# Patient Record
Sex: Male | Born: 1960
Health system: Southern US, Community
[De-identification: ages and names within clinical notes are randomized; demographics above are authoritative.]

## PROBLEM LIST (undated history)

## (undated) DIAGNOSIS — R1310 Dysphagia, unspecified: Secondary | ICD-10-CM

## (undated) DIAGNOSIS — M1990 Unspecified osteoarthritis, unspecified site: Secondary | ICD-10-CM

## (undated) DIAGNOSIS — M19019 Primary osteoarthritis, unspecified shoulder: Secondary | ICD-10-CM

## (undated) DIAGNOSIS — M2550 Pain in unspecified joint: Secondary | ICD-10-CM

## (undated) DIAGNOSIS — E119 Type 2 diabetes mellitus without complications: Secondary | ICD-10-CM

## (undated) DIAGNOSIS — I2510 Atherosclerotic heart disease of native coronary artery without angina pectoris: Secondary | ICD-10-CM

## (undated) DIAGNOSIS — R12 Heartburn: Secondary | ICD-10-CM

## (undated) DIAGNOSIS — R053 Chronic cough: Secondary | ICD-10-CM

## (undated) DIAGNOSIS — E538 Deficiency of other specified B group vitamins: Secondary | ICD-10-CM

## (undated) DIAGNOSIS — E11319 Type 2 diabetes mellitus with unspecified diabetic retinopathy without macular edema: Secondary | ICD-10-CM

## (undated) DIAGNOSIS — M7989 Other specified soft tissue disorders: Secondary | ICD-10-CM

## (undated) DIAGNOSIS — N289 Disorder of kidney and ureter, unspecified: Secondary | ICD-10-CM

## (undated) DIAGNOSIS — E669 Obesity, unspecified: Secondary | ICD-10-CM

## (undated) DIAGNOSIS — E7800 Pure hypercholesterolemia, unspecified: Secondary | ICD-10-CM

## (undated) DIAGNOSIS — F988 Other specified behavioral and emotional disorders with onset usually occurring in childhood and adolescence: Secondary | ICD-10-CM

## (undated) DIAGNOSIS — G629 Polyneuropathy, unspecified: Secondary | ICD-10-CM

## (undated) DIAGNOSIS — M549 Dorsalgia, unspecified: Secondary | ICD-10-CM

## (undated) DIAGNOSIS — R05 Cough: Secondary | ICD-10-CM

## (undated) DIAGNOSIS — E785 Hyperlipidemia, unspecified: Secondary | ICD-10-CM

## (undated) DIAGNOSIS — M25569 Pain in unspecified knee: Secondary | ICD-10-CM

## (undated) DIAGNOSIS — G4730 Sleep apnea, unspecified: Secondary | ICD-10-CM

## (undated) DIAGNOSIS — I214 Non-ST elevation (NSTEMI) myocardial infarction: Secondary | ICD-10-CM

## (undated) DIAGNOSIS — K219 Gastro-esophageal reflux disease without esophagitis: Secondary | ICD-10-CM

## (undated) DIAGNOSIS — I10 Essential (primary) hypertension: Secondary | ICD-10-CM

## (undated) HISTORY — DX: Other specified behavioral and emotional disorders with onset usually occurring in childhood and adolescence: F98.8

## (undated) HISTORY — DX: Type 2 diabetes mellitus with unspecified diabetic retinopathy without macular edema: E11.319

## (undated) HISTORY — DX: Hyperlipidemia, unspecified: E78.5

## (undated) HISTORY — DX: Other specified soft tissue disorders: M79.89

## (undated) HISTORY — DX: Pain in unspecified knee: M25.569

## (undated) HISTORY — PX: ACHILLES TENDON REPAIR: SUR1153

## (undated) HISTORY — DX: Deficiency of other specified B group vitamins: E53.8

## (undated) HISTORY — DX: Chronic cough: R05.3

## (undated) HISTORY — DX: Obesity, unspecified: E66.9

## (undated) HISTORY — PX: UVULOPALATOPHARYNGOPLASTY: SHX827

## (undated) HISTORY — DX: Pure hypercholesterolemia, unspecified: E78.00

## (undated) HISTORY — DX: Essential (primary) hypertension: I10

## (undated) HISTORY — DX: Unspecified osteoarthritis, unspecified site: M19.90

## (undated) HISTORY — DX: Dysphagia, unspecified: R13.10

## (undated) HISTORY — DX: Type 2 diabetes mellitus without complications: E11.9

## (undated) HISTORY — DX: Dorsalgia, unspecified: M54.9

## (undated) HISTORY — DX: Heartburn: R12

## (undated) HISTORY — DX: Primary osteoarthritis, unspecified shoulder: M19.019

## (undated) HISTORY — DX: Non-ST elevation (NSTEMI) myocardial infarction: I21.4

## (undated) HISTORY — DX: Pain in unspecified joint: M25.50

## (undated) HISTORY — DX: Polyneuropathy, unspecified: G62.9

## (undated) HISTORY — PX: SHOULDER ARTHROSCOPY: SHX128

## (undated) HISTORY — DX: Disorder of kidney and ureter, unspecified: N28.9

---
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
## 1997-06-16 ENCOUNTER — Other Ambulatory Visit: Admission: RE | Admit: 1997-06-16 | Discharge: 1997-06-16 | Payer: Self-pay | Admitting: *Deleted

## 1997-10-03 ENCOUNTER — Emergency Department (HOSPITAL_COMMUNITY): Admission: EM | Admit: 1997-10-03 | Discharge: 1997-10-03 | Payer: Self-pay | Admitting: Emergency Medicine

## 1998-04-20 ENCOUNTER — Encounter: Payer: Self-pay | Admitting: Orthopaedic Surgery

## 1998-04-20 ENCOUNTER — Ambulatory Visit (HOSPITAL_COMMUNITY): Admission: RE | Admit: 1998-04-20 | Discharge: 1998-04-20 | Payer: Self-pay | Admitting: Orthopaedic Surgery

## 1998-06-24 ENCOUNTER — Ambulatory Visit (HOSPITAL_COMMUNITY): Admission: RE | Admit: 1998-06-24 | Discharge: 1998-06-25 | Payer: Self-pay | Admitting: *Deleted

## 2003-05-28 ENCOUNTER — Encounter: Admission: RE | Admit: 2003-05-28 | Discharge: 2003-05-28 | Payer: Self-pay | Admitting: Internal Medicine

## 2004-09-20 ENCOUNTER — Encounter: Admission: RE | Admit: 2004-09-20 | Discharge: 2004-09-20 | Payer: Self-pay | Admitting: Internal Medicine

## 2004-09-29 ENCOUNTER — Encounter: Admission: RE | Admit: 2004-09-29 | Discharge: 2004-09-29 | Payer: Self-pay | Admitting: Internal Medicine

## 2007-05-15 ENCOUNTER — Ambulatory Visit: Payer: Self-pay | Admitting: Emergency Medicine

## 2007-05-15 DIAGNOSIS — F988 Other specified behavioral and emotional disorders with onset usually occurring in childhood and adolescence: Secondary | ICD-10-CM | POA: Insufficient documentation

## 2007-05-15 DIAGNOSIS — E669 Obesity, unspecified: Secondary | ICD-10-CM | POA: Insufficient documentation

## 2007-05-15 DIAGNOSIS — G4730 Sleep apnea, unspecified: Secondary | ICD-10-CM

## 2007-05-15 DIAGNOSIS — G4733 Obstructive sleep apnea (adult) (pediatric): Secondary | ICD-10-CM | POA: Insufficient documentation

## 2007-05-15 DIAGNOSIS — R05 Cough: Secondary | ICD-10-CM

## 2007-05-15 DIAGNOSIS — I10 Essential (primary) hypertension: Secondary | ICD-10-CM

## 2007-05-15 DIAGNOSIS — J309 Allergic rhinitis, unspecified: Secondary | ICD-10-CM | POA: Insufficient documentation

## 2007-05-15 DIAGNOSIS — E119 Type 2 diabetes mellitus without complications: Secondary | ICD-10-CM | POA: Insufficient documentation

## 2007-05-15 DIAGNOSIS — R059 Cough, unspecified: Secondary | ICD-10-CM | POA: Insufficient documentation

## 2007-06-16 ENCOUNTER — Ambulatory Visit: Payer: Self-pay | Admitting: Emergency Medicine

## 2007-06-20 ENCOUNTER — Encounter: Payer: Self-pay | Admitting: Emergency Medicine

## 2007-07-30 ENCOUNTER — Ambulatory Visit: Payer: Self-pay | Admitting: Emergency Medicine

## 2010-02-12 DIAGNOSIS — I209 Angina pectoris, unspecified: Secondary | ICD-10-CM

## 2010-02-12 HISTORY — DX: Angina pectoris, unspecified: I20.9

## 2010-02-12 HISTORY — PX: OTHER SURGICAL HISTORY: SHX169

## 2010-03-05 ENCOUNTER — Encounter: Payer: Self-pay | Admitting: Internal Medicine

## 2010-03-16 NOTE — Assessment & Plan Note (Signed)
Summary: cough   Visit Type:  Follow-up Referred by:  Johnella Moloney PCP:  Kevan Ny  Chief Complaint:  cough and f/u PFTs.  History of Present Illness: 50 yo man with HTN, OSA on CPAP for last 5 -6 months. Seen last month in consultation for chronic cough and significant vasomotor nasal gtt. He had been treated for asthma empirically prior to our visit without significant improvement. We started a regimen for PND and also for GERD. He had PFT scheduled for today, but was unable to complete - had pre-syncope with spirometry and the testing was stopped.  Started omeprazole x 14 days, then stopped. Singulair daily, also started pseudophed PE.  Cough is definitely better, does still occur intermittantly. Non-productive. Does have some exertional SOB.      Current Allergies: No known allergies      Review of Systems      See HPI   Vital Signs:  Patient Profile:   50 Years Old Male Height:     71 inches Weight:      318 pounds O2 Sat:      96 % O2 treatment:    Room Air Temp:     98.1 degrees F oral Pulse rate:   82 / minute BP sitting:   118 / 80  (left arm) Cuff size:   large  Vitals Entered By: Marinus Maw (Jun 16, 2007 9:18 AM)             Comments Medications reviewed with patient Marinus Maw  Jun 16, 2007 9:25 AM      Physical Exam  General:      pleasant cooperative obese man, NAD on RA Head:     normocephalic and atraumatic Nose:     no deformity, discharge, inflammation, or lesions Mouth:     narrow posterior pharynx, mild erythema Neck:     no masses, thyromegaly, or abnormal cervical nodes Lungs:     clear bilaterally Heart:     regular rate and rhythm, S1, S2 without murmurs, rubs, gallops, or clicks Abdomen:     not examined  Msk:     no deformity or scoliosis noted with normal posture Extremities:     no clubbing, cyanosis, edema, or deformity noted Cervical Nodes:     no significant adenopathy Psych:     alert and  cooperative; normal mood and affect; normal attention span and concentration     Impression & Recommendations:  Problem # 1:  COUGH (ICD-786.2) There has been improvement, still some residual dry cough. Will add loratadine, change his decongestant to as needed. Continue Singulair and change schedule to pm. Will not add back omeprazole at this time, but consider if sx persist.  Only able to give a single spirometry  - curve looks normal, raw data with possible mixed mild restriction and obstruction. Will not pursue asthma at this time, but discussed repeating PFT's if he has suggestive sx. RTC in 4 -6 weeks to review progress. Orders: Est. Patient Level IV (16109)   Problem # 2:  SLEEP APNEA (ICD-780.57)  Problem # 3:  ALLERGIC RHINITIS (ICD-477.9)  His updated medication list for this problem includes:    Sudafed 30 Mg Tabs (Pseudoephedrine hcl) .Marland Kitchen... As needed   Problem # 4:  HYPERTENSION (ICD-401.9)  The following medications were removed from the medication list:    Cartia Xt 180 Mg Cp24 (Diltiazem hcl coated beads) .Marland Kitchen... Take 1 tablet by mouth once a day  His updated medication list for  this problem includes:    Exforge 5-320 Mg Tabs (Amlodipine besylate-valsartan) ..... One tablet every other day.    Exforge 10-320 Mg Tabs (Amlodipine besylate-valsartan) ..... One tablet every other day.   Medications Added to Medication List This Visit: 1)  Singulair 10 Mg Tabs (Montelukast sodium) .... One by mouth daily 2)  Sudafed 30 Mg Tabs (Pseudoephedrine hcl) .... As needed 3)  Exforge 5-320 Mg Tabs (Amlodipine besylate-valsartan) .... One tablet every other day. 4)  Exforge 10-320 Mg Tabs (Amlodipine besylate-valsartan) .... One tablet every other day.   Patient Instructions: 1)  Start loratadine 10mg  daily. 2)  Continue the Singulair 10mg  every evening. 3)  Change your pseudophed to as needed.  4)  Do not restart the omeprazole at this time. 5)  We may consider repeating your  PFT's at some point in the future if new symptoms evolve. 6)  Return to see Dr Delton Coombes in 4 - 6 weeks.     ]

## 2010-03-16 NOTE — Assessment & Plan Note (Signed)
Summary: cough, OSA   Visit Type:  Follow-up Referred by:  Johnella Moloney PCP:  Kevan Ny  Chief Complaint:  Cough and OSA.  History of Present Illness: 50 yo man with HTN, OSA on CPAP for last 5 -6 months. Seen last month in consultation for chronic cough and significant vasomotor nasal gtt. Started loratadine after last visit, has now stopped as cough resolved.  Still coughs occasionally, no longer paroxysmal, not having every day, non-productive. Overall much improved. Denies dyspnea or wheeze, spiro does not support AFL.     Current Allergies: No known allergies       Vital Signs:  Patient Profile:   50 Years Old Male Height:     71 inches O2 Sat:      93 % O2 treatment:    Room Air Temp:     99.2 degrees F oral Pulse rate:   100 / minute BP sitting:   134 / 84  (left arm)  Vitals Entered By: Cloyde Reams RN (July 30, 2007 1:34 PM)             Comments Pt is here today for a follow-up visit. Pt refused to weight today.  Pt states cough is much better.  Still has occ minor non-productive cough.  Pt is using CPAP for sleep apnea without any problems.Currently on doxycycline for a spider bite started last Thursday.   Medications reviewed Cloyde Reams RN  July 30, 2007 1:40 PM      Physical Exam  General:      pleasant cooperative obese man, NAD on RA Head:     normocephalic and atraumatic Nose:     no deformity, discharge, inflammation, or lesions Mouth:     narrow posterior pharynx, clear Neck:     no masses, thyromegaly, or abnormal cervical nodes Lungs:     clear bilaterally Heart:     regular rate and rhythm, S1, S2 without murmurs, rubs, gallops, or clicks Abdomen:     not examined  Msk:     no deformity or scoliosis noted with normal posture Extremities:     no clubbing, cyanosis, edema, or deformity noted Cervical Nodes:     no significant adenopathy Psych:     alert and cooperative; normal mood and affect; normal attention span and  concentration     Impression & Recommendations:  Problem # 1:  COUGH (ICD-786.2) Much improved. Advised loratadine during allergy season, continue singulair although may be able to wean to off in future. F/U as needed  Orders: Est. Patient Level III (04540)   Problem # 2:  SLEEP APNEA (ICD-780.57) CPAP Orders: Est. Patient Level III (98119)   Problem # 3:  ALLERGIC RHINITIS (ICD-477.9)   Patient Instructions: 1)  Continue Singulair once daily. 2)  Use loratadine 10mg  once daily during allergy seasons. 3)  Try chlorphenirimine or pseudophedrine for nasal congestion as needed. 4)  Continue your CPAP every night.  5)  Follow up with Dr Delton Coombes as needed.    ]

## 2010-03-16 NOTE — Miscellaneous (Signed)
Summary: PFT Results  Clinical Lists Changes  Observations: Added new observation of PFT COMMENTS: Only one trial performed. Raw data suggest possible mixed disease. The flow volume loop has a  normal shape, less suggestive of obstruction. RSB (06/20/2007 15:35) Added new observation of FEF % EXPEC: 60 % (06/20/2007 15:35) Added new observation of FEF25-75%PRE: 3.87 L/min (06/20/2007 15:35) Added new observation of FEF 25-75%: 2.32 L/min (06/20/2007 15:35) Added new observation of FEV1/FVC%EXP: - % (06/20/2007 15:35) Added new observation of FEV1/FVC PRE: 74 % (06/20/2007 15:35) Added new observation of FEV1/FVC: 75 % (06/20/2007 15:35) Added new observation of FEV1 % EXP: 76 % (06/20/2007 15:35) Added new observation of FEV1 PREDICT: 3.86 L (06/20/2007 15:35) Added new observation of FEV1: 2.92 L (06/20/2007 15:35) Added new observation of FVC % EXPECT: 75 % (06/20/2007 15:35) Added new observation of FVC PREDICT: 5.17 L (06/20/2007 15:35) Added new observation of FVC: 3.89 L (06/20/2007 15:35) Added new observation of PFT DATE: 06/16/2007  (06/20/2007 15:35)       Pulmonary Function Test Date: 06/16/2007 Height (in.): 71 Gender: Male  Pre-Spirometry FVC    Value: 3.89 L/min   Pred: 5.17 L/min     % Pred: 75 % FEV1    Value: 2.92 L     Pred: 3.86 L     % Pred: 76 % FEV1/FVC  Value: 75 %     Pred: 74 %     % Pred: - % FEF 25-75  Value: 2.32 L/min   Pred: 3.87 L/min     % Pred: 60 %  Comments: Only one trial performed. Raw data suggest possible mixed disease. The flow volume loop has a  normal shape, less suggestive of obstruction. RSB  Pt unable to do full PFT.  Only able to get simple spirometry x 1 trial. Cloyde Reams RN  Jun 20, 2007 3:39 PM

## 2010-03-16 NOTE — Assessment & Plan Note (Signed)
Summary: Cough, OSA   Visit Type:  Initial Consult Referred by:  Johnella Moloney PCP:  Kevan Ny  Chief Complaint:  cough.  History of Present Illness: 50 yo man with HTN, OSA on CPAP for last 5 -6 months. Also with obesity - wt has been steady over last 8 months, allergic rhinitis - used to be on immunotherapy as a youth.  Did not have cough until 10/08.  At that time began to have cough and increased allergic sx. Started CPAP around the same time.  Has fits of cough, has come and gone for days at a time, dry cough, no globus sensation. Clear nasal drainage, occas treats with Singulair - not scheduled. No CP. Tires a bit easier over several months time. Hears noise with breathing, others hear it too.  Has been tried on Symbicort and Xopenex empirically for ? asthma.  Has since stopped these meds because they didi not help the cough. Denies GERD sx. Has hx esophageal strictures s/p dilation. No longer having dysphagia or food getting stuck.     Current Allergies: No known allergies   Past Medical History:    Allergic Rhinitis dating back to childhood    ? Asthma (no reported PFT's)    ADHD    Hypertension    Sleep Apnea on CPAP     Gout    Hx of esophageal strictures, s/p dilation (last time 2-3 yrs ago)  Past Surgical History:    Ulnar nerve palsy + transposition    torn ankle tendons    torn achilles tendon   Family History:    father-heart disease    mother-breast CA    mat uncle-asthma  Social History:    Patient never smoked.     Pt is married with children.    Pt is employeed in sales.   Risk Factors:  Tobacco use:  never HIV high-risk behavior:  no Alcohol use:  yes    Type:  Beer    Comments:  Beer on the weekends   Review of Systems      See HPI   Vital Signs:  Patient Profile:   50 Years Old Male O2 Sat:      94 % O2 treatment:    Room Air Temp:     98.7 degrees F oral Pulse rate:   93 / minute BP sitting:   170 / 110  (right arm)  Vitals Entered  By: Cloyde Reams RN (May 15, 2007 3:50 PM)             Comments Pt is here today for a new patient concult for chronic cough. Pt declines weight.  States he does not want a written record of his weight.  C/O dry chronic cough since 10/08.  Medications reviewed Pt states he has not had any CXRs or PFTs done. ..................................................................Marland KitchenCloyde Reams RN  May 15, 2007 3:56 PM       Physical Exam  General:      pleasant cooperative obese man, NAD on RA Head:     normocephalic and atraumatic Nose:     no deformity, discharge, inflammation, or lesions Mouth:     narrow posterior pharynx, mild erythema Neck:     no masses, thyromegaly, or abnormal cervical nodes Lungs:     clear bilaterally Heart:     regular rate and rhythm, S1, S2 without murmurs, rubs, gallops, or clicks Abdomen:     not examined  Msk:     no deformity or  scoliosis noted with normal posture Extremities:     no clubbing, cyanosis, edema, or deformity noted Cervical Nodes:     no significant adenopathy Psych:     alert and cooperative; normal mood and affect; normal attention span and concentration     Impression & Recommendations:  Problem # 1:  COUGH (ICD-786.2) Most overwhelming contributor appears to be allergic +/- chronic vasomotor rhinitis.  Also consider influence of GERD given hx esophageal strictures and dilations. Finally consider asthma, although he hasn't significantly improved on empiric therapy.  Start OTC decongestant regimen (scheduled) - chlorphenirimine, pseudoephedrine Start nasonex daily Start empiric omeprazole. Will peel this off as sx improve to see if necessary to continue on scheduled basis. Full PFT's to look for AFL - if present then maintainance asthma regimen Est. Patient Level IV (04540)  Orders: Est. Patient Level IV (98119)   Problem # 2:  ALLERGIC RHINITIS (ICD-477.9) As above  Problem # 3:  SLEEP APNEA  (ICD-780.57) Continue current CPAP - interestingly his cough started around the tikme he started CPAP. ? positive pressure irritating posterior pharynx - usually CPAP helps chronic coughers by stopping snoring and upper airway irritation.  Will follow sx, if not improving then may try break from CPAP temporarily to allow UA sx to calm, then rechallenge.  Medications Added to Medication List This Visit: 1)  Topamax 50 Mg Tabs (Topiramate) .... Take 1 tablet by mouth once a day 2)  Lexapro 10 Mg Tabs (Escitalopram oxalate) .... Take 1 tablet by mouth once a day 3)  Cartia Xt 180 Mg Cp24 (Diltiazem hcl coated beads) .... Take 1 tablet by mouth once a day 4)  Methylphenidate Hcl 10 Mg Tbcr (Methylphenidate hcl) .... Take 1 tablet by mouth once a day   Patient Instructions: 1)  start Nasonex 2 sprays each nostril daily 2)  Start chlorphenerimine/pseudoephedrine over-the-counter, as directed by the label. take daily until return visit. 3)  Start omeprazole 20mg  daily (Prilosec). 4)  Full pulmonary function testing. 5)  Follow up with Dr Delton Coombes in 1 month.    ]

## 2010-04-13 DIAGNOSIS — Z955 Presence of coronary angioplasty implant and graft: Secondary | ICD-10-CM

## 2010-04-13 HISTORY — DX: Presence of coronary angioplasty implant and graft: Z95.5

## 2010-04-26 ENCOUNTER — Emergency Department (HOSPITAL_COMMUNITY): Payer: BC Managed Care – PPO

## 2010-04-26 ENCOUNTER — Inpatient Hospital Stay (HOSPITAL_COMMUNITY)
Admission: EM | Admit: 2010-04-26 | Discharge: 2010-04-29 | DRG: 853 | Disposition: A | Discharge status: Home / Self Care | Payer: BC Managed Care – PPO | Attending: Internal Medicine | Admitting: Internal Medicine

## 2010-04-26 DIAGNOSIS — IMO0002 Reserved for concepts with insufficient information to code with codable children: Secondary | ICD-10-CM | POA: Diagnosis present

## 2010-04-26 DIAGNOSIS — E1165 Type 2 diabetes mellitus with hyperglycemia: Secondary | ICD-10-CM | POA: Diagnosis present

## 2010-04-26 DIAGNOSIS — E785 Hyperlipidemia, unspecified: Secondary | ICD-10-CM | POA: Diagnosis present

## 2010-04-26 DIAGNOSIS — I10 Essential (primary) hypertension: Secondary | ICD-10-CM | POA: Diagnosis present

## 2010-04-26 DIAGNOSIS — I2510 Atherosclerotic heart disease of native coronary artery without angina pectoris: Secondary | ICD-10-CM | POA: Diagnosis present

## 2010-04-26 DIAGNOSIS — I2119 ST elevation (STEMI) myocardial infarction involving other coronary artery of inferior wall: Principal | ICD-10-CM | POA: Diagnosis present

## 2010-04-26 DIAGNOSIS — F988 Other specified behavioral and emotional disorders with onset usually occurring in childhood and adolescence: Secondary | ICD-10-CM | POA: Diagnosis present

## 2010-04-26 DIAGNOSIS — G43909 Migraine, unspecified, not intractable, without status migrainosus: Secondary | ICD-10-CM | POA: Diagnosis present

## 2010-04-26 DIAGNOSIS — G4733 Obstructive sleep apnea (adult) (pediatric): Secondary | ICD-10-CM | POA: Diagnosis present

## 2010-04-26 LAB — CBC
HCT: 44.7 % (ref 39.0–52.0)
Hemoglobin: 16 g/dL (ref 13.0–17.0)
MCH: 30.8 pg (ref 26.0–34.0)
MCHC: 35.8 g/dL (ref 30.0–36.0)
MCV: 86.1 fL (ref 78.0–100.0)
Platelets: 259 10*3/uL (ref 150–400)
RBC: 5.19 MIL/uL (ref 4.22–5.81)
RDW: 12.6 % (ref 11.5–15.5)
WBC: 10.5 10*3/uL (ref 4.0–10.5)

## 2010-04-26 LAB — COMPREHENSIVE METABOLIC PANEL
ALT: 36 U/L (ref 0–53)
AST: 33 U/L (ref 0–37)
Albumin: 3.9 g/dL (ref 3.5–5.2)
Alkaline Phosphatase: 81 U/L (ref 39–117)
BUN: 20 mg/dL (ref 6–23)
CO2: 21 mEq/L (ref 19–32)
Calcium: 8.7 mg/dL (ref 8.4–10.5)
Chloride: 105 mEq/L (ref 96–112)
Creatinine, Ser: 1.2 mg/dL (ref 0.4–1.5)
GFR calc Af Amer: >60 mL/min (ref >60)
GFR calc non Af Amer: >60 mL/min (ref >60)
Glucose, Bld: 332 mg/dL — ABNORMAL HIGH (ref 70–99)
Potassium: 4.2 mEq/L (ref 3.5–5.1)
Sodium: 133 mEq/L — ABNORMAL LOW (ref 135–145)
Total Bilirubin: 1.4 mg/dL — ABNORMAL HIGH (ref 0.3–1.2)
Total Protein: 6.7 g/dL (ref 6.0–8.3)

## 2010-04-26 LAB — POCT CARDIAC MARKERS
CKMB, poc: 1.1 ng/mL (ref 1.0–8.0)
Myoglobin, poc: 81.1 ng/mL (ref 12–200)
Troponin i, poc: <0.05 ng/mL (ref 0.00–0.09)

## 2010-04-26 LAB — RAPID URINE DRUG SCREEN, HOSP PERFORMED
Amphetamines: NOT DETECTED
Barbiturates: NOT DETECTED
Benzodiazepines: NOT DETECTED
Cocaine: NOT DETECTED
Opiates: NOT DETECTED
Tetrahydrocannabinol: NOT DETECTED

## 2010-04-26 LAB — CK TOTAL AND CKMB (NOT AT ARMC)
CK, MB: 15.6 ng/mL (ref 0.3–4.0)
Relative Index: 9.6 — ABNORMAL HIGH (ref 0.0–2.5)
Total CK: 163 U/L (ref 7–232)

## 2010-04-26 LAB — TROPONIN I: Troponin I: 0.44 ng/mL — ABNORMAL HIGH (ref 0.00–0.06)

## 2010-04-26 LAB — LIPASE, BLOOD: Lipase: 21 U/L (ref 11–59)

## 2010-04-27 LAB — CARDIAC PANEL(CRET KIN+CKTOT+MB+TROPI)
CK, MB: 89.2 ng/mL (ref 0.3–4.0)
Relative Index: 14.6 — ABNORMAL HIGH (ref 0.0–2.5)
Total CK: 613 U/L — ABNORMAL HIGH (ref 7–232)
Troponin I: 11.4 ng/mL (ref 0.00–0.06)

## 2010-04-27 LAB — GLUCOSE, CAPILLARY
Glucose-Capillary: 239 mg/dL — ABNORMAL HIGH (ref 70–99)
Glucose-Capillary: 281 mg/dL — ABNORMAL HIGH (ref 70–99)
Glucose-Capillary: 267 mg/dL — ABNORMAL HIGH (ref 70–99)
Glucose-Capillary: 302 mg/dL — ABNORMAL HIGH (ref 70–99)

## 2010-04-27 LAB — CK TOTAL AND CKMB (NOT AT ARMC)
CK, MB: 82.6 ng/mL (ref 0.3–4.0)
Relative Index: 14.3 — ABNORMAL HIGH (ref 0.0–2.5)
Total CK: 576 U/L — ABNORMAL HIGH (ref 7–232)

## 2010-04-27 LAB — HEPARIN LEVEL (UNFRACTIONATED)
Heparin Unfractionated: 0.51 IU/mL (ref 0.30–0.70)
Heparin Unfractionated: 0.5 IU/mL (ref 0.30–0.70)

## 2010-04-27 LAB — TROPONIN I: Troponin I: 8.73 ng/mL (ref 0.00–0.06)

## 2010-04-27 LAB — LIPID PANEL
Cholesterol: 155 mg/dL (ref 0–200)
HDL: 38 mg/dL — ABNORMAL LOW (ref >39)
LDL Cholesterol: 88 mg/dL (ref 0–99)
Total CHOL/HDL Ratio: 4.1 RATIO
Triglycerides: 146 mg/dL (ref <150)
VLDL: 29 mg/dL (ref 0–40)

## 2010-04-27 LAB — MRSA PCR SCREENING: MRSA by PCR: NEGATIVE

## 2010-04-27 LAB — HEMOGLOBIN A1C
Hgb A1c MFr Bld: 11.9 % — ABNORMAL HIGH (ref <5.7)
Mean Plasma Glucose: 295 mg/dL — ABNORMAL HIGH (ref <117)

## 2010-04-27 LAB — TSH: TSH: 2.149 u[IU]/mL (ref 0.350–4.500)

## 2010-04-28 LAB — CBC
HCT: 42.7 % (ref 39.0–52.0)
Hemoglobin: 14.7 g/dL (ref 13.0–17.0)
MCH: 30.2 pg (ref 26.0–34.0)
MCHC: 34.4 g/dL (ref 30.0–36.0)
MCV: 87.7 fL (ref 78.0–100.0)
Platelets: 216 10*3/uL (ref 150–400)
RBC: 4.87 MIL/uL (ref 4.22–5.81)
RDW: 12.8 % (ref 11.5–15.5)
WBC: 9.3 10*3/uL (ref 4.0–10.5)

## 2010-04-28 LAB — BASIC METABOLIC PANEL
BUN: 18 mg/dL (ref 6–23)
CO2: 29 mEq/L (ref 19–32)
Calcium: 8.7 mg/dL (ref 8.4–10.5)
Chloride: 104 mEq/L (ref 96–112)
Creatinine, Ser: 1.24 mg/dL (ref 0.4–1.5)
GFR calc Af Amer: >60 mL/min (ref >60)
GFR calc non Af Amer: >60 mL/min (ref >60)
Glucose, Bld: 178 mg/dL — ABNORMAL HIGH (ref 70–99)
Potassium: 3.7 mEq/L (ref 3.5–5.1)
Sodium: 139 mEq/L (ref 135–145)

## 2010-04-28 LAB — PROTIME-INR
INR: 0.99 (ref 0.00–1.49)
Prothrombin Time: 13.3 seconds (ref 11.6–15.2)

## 2010-04-28 LAB — POCT ACTIVATED CLOTTING TIME: Activated Clotting Time: 552 seconds

## 2010-04-28 LAB — GLUCOSE, CAPILLARY
Glucose-Capillary: 220 mg/dL — ABNORMAL HIGH (ref 70–99)
Glucose-Capillary: 211 mg/dL — ABNORMAL HIGH (ref 70–99)
Glucose-Capillary: 164 mg/dL — ABNORMAL HIGH (ref 70–99)
Glucose-Capillary: 160 mg/dL — ABNORMAL HIGH (ref 70–99)
Glucose-Capillary: 167 mg/dL — ABNORMAL HIGH (ref 70–99)

## 2010-04-28 LAB — CARDIAC PANEL(CRET KIN+CKTOT+MB+TROPI)
CK, MB: 8.1 ng/mL (ref 0.3–4.0)
Relative Index: 6 — ABNORMAL HIGH (ref 0.0–2.5)
Total CK: 135 U/L (ref 7–232)
Troponin I: 3.15 ng/mL (ref 0.00–0.06)

## 2010-04-28 LAB — HEPARIN LEVEL (UNFRACTIONATED): Heparin Unfractionated: 0.56 IU/mL (ref 0.30–0.70)

## 2010-04-28 LAB — MRSA PCR SCREENING: MRSA by PCR: NEGATIVE

## 2010-04-28 NOTE — H&P (Signed)
NAME:  [name], CHEW NO.:  [id number]  MEDICAL RECORD NO.:  [id number]           PATIENT TYPE:  E  LOCATION:  [facility]                         FACILITY:  [facility]  PHYSICIAN:  Mariea Stable, MD   DATE OF BIRTH:  [birth year]  DATE OF ADMISSION:  04/26/2010 DATE OF DISCHARGE:                             HISTORY & PHYSICAL   PRIMARY CARE PHYSICIAN:  Dr. Kevan Ny.  CHIEF COMPLAINT:  Chest pain.  HISTORY OF PRESENT ILLNESS:  [name] is a 50 year old man with past medical history significant for diabetes, hypertension, obstructive sleep apnea, and a family history with father having an MI at age 1 who presents with chief complaint of chest pain.  He says the chest pain started at 1 p.m. today while at rest.  He describes it as being in the center chest/epigastric area described as having an acute onset, tight in nature, moderate intensity associated with some shortness of breath. He says that it has been similar to episodes of indigestion that he has had in the past, although nowhere nearly a severe, not lasting as long. The episode lasted approximately 10-15 minutes, but has continued to wax and wane throughout and currently has a very mild discomfort present. He states that for the indigestion episodes, he usually takes Tagamet with relief, although at this time it did not help.  He further states that his father was present and had some nitroglycerin for which he took one and had mild/brief relief, but the pain returned subsequently.  He denies any other associated symptoms.  PAST MEDICAL HISTORY: 1. Diabetes. 2. Hypertension. 3. Obstructive sleep apnea. 4. Attention deficit disorder.  MEDICATIONS: 1. Hydrochlorothiazide 25 mg half a tablet p.o. daily. 2. Metformin 500 mg p.o. daily. 3. Crestor 10 mg p.o. daily. 4. Citalopram 20 mg p.o. daily. 5. Exforge 10/320 mg p.o. daily. 6. Topamax 100 mg p.o. daily.  ALLERGIES:  No known drug allergies.  SOCIAL  HISTORY:  The patient is married, wife's name is Rachael Fee, phone number is [phone].  The patient has prior history of occasional cigar, use but no formal cigarettes or consistent use.  He drinks alcohol and this consists of approximately a six-pack during the weekends.  He denies any drug use.  FAMILY HISTORY:  Significant for coronary artery disease with father having an MI at age 64 as well as diabetes.  The father is still alive at age 33.  Mother had breast cancer and passed away in approximately age 30.  REVIEW OF SYSTEMS:  As per HPI.  All others reviewed and negative.  PHYSICAL EXAMINATION:  VITAL SIGNS:  Temperature 98, blood pressure 147/84, heart rate of 90, respirations 20, and oxygen saturation 97% on room air. GENERAL:  This is an obese man lying in bed in no acute distress, he is pleasant. HEENT:  Head is normocephalic, atraumatic.  Pupils are equally round and reactive to light.  Extraocular movements are intact.  Sclerae are anicteric.  Mucous membranes are moist.  There is no oropharyngeal lesions. NECK:  Supple.  There is no thyromegaly.  There is no carotid bruit.  It is difficult to assess  for JVD given his habitus. HEART:  There is a normal S1, S3 with a regular rate and rhythm.  No murmurs, gallops, or rubs. LUNGS:  Clear to auscultation bilaterally with good air movement. ABDOMEN:  Obese with normal bowel sounds, soft, nontender, nondistended. No guarding or rebound. EXTREMITIES:  There is no cyanosis, clubbing, or edema. NEUROLOGIC:  The patient is awake, alert, and oriented x3.  Cranial nerves II through XII are intact.  Sensation is intact.  Motor is intact.  LABORATORY DATA:  WBC 10.5, hemoglobin 16.0, and platelets 259.  Sodium 133, potassium 4.2 with slight hemolysis noted, chloride 105, bicarb 21, glucose 332, BUN 20, and creatinine 1.2.  LFTs are within normal limits except for a bilirubin of 1.4.  Point-of-care cardiac markers are negative with a  CK-MB of 1.1, troponin of less than 0.05, a myoglobin of 81.1, and lipase is also normal at 21.  IMAGING DATA:  Chest x-ray, impression negative.  Electrocardiogram shows sinus arrhythmia with a ventricular rate of 73, some nonspecific ST changes, but no acute ischemic findings.  ASSESSMENT/PLAN: 1. Chest pain.  This is somewhat atypical, although it does raise a     concern in 50 year old gentleman with multiple risk factors listed     above.  We will admit to tele and cycle cardiac enzymes to rule out     an acute coronary syndrome.  We will also risk stratify with a     fasting lipid panel and hemoglobin A1c.  We will also check TSH.     If the patient rules out, we will consider getting a Cardiology     consult in the morning for an exercise stress Myoview prior to     discharge if this is the case.  We will continue with daily full-     dose aspirin, p.r.n. nitroglycerin, and statin.  We will not start     beta-blocker at this time, as he is normotensive and has a     ventricular rate of 78. 2. Hypertension.  We will continue with home medications as listed     above. 3. Diabetes.  We will hold on metformin currently and cover with     sliding scale insulin while in-house.  We will check an A1c. 4. Hyperlipidemia.  We will continue with home medications and check a     fasting lipid panel. 5. Obstructive sleep apnea.  The patient reports having obstructive     sleep apnea.  May need to consider CPAP per RT while in-house. 6.     Obesity.  The patient is to be counseled on diet and exercise. 6. Code status.  The patient is a full code.  Next of kin is wife     whose name and number are mentioned above.     Mariea Stable, MD     MA/MEDQ  D:  04/26/2010  T:  04/26/2010  Job:  [id number]  Electronically Signed by Mariea Stable MD on 04/28/2010 07:24:30 AM

## 2010-04-29 LAB — BASIC METABOLIC PANEL
BUN: 15 mg/dL (ref 6–23)
CO2: 23 mEq/L (ref 19–32)
Calcium: 8.8 mg/dL (ref 8.4–10.5)
Chloride: 109 mEq/L (ref 96–112)
Creatinine, Ser: 1.18 mg/dL (ref 0.4–1.5)
GFR calc Af Amer: >60 mL/min (ref >60)
GFR calc non Af Amer: >60 mL/min (ref >60)
Glucose, Bld: 171 mg/dL — ABNORMAL HIGH (ref 70–99)
Potassium: 3.8 mEq/L (ref 3.5–5.1)
Sodium: 140 mEq/L (ref 135–145)

## 2010-04-29 LAB — CBC
HCT: 40.7 % (ref 39.0–52.0)
Hemoglobin: 14.2 g/dL (ref 13.0–17.0)
MCH: 30.5 pg (ref 26.0–34.0)
MCHC: 34.9 g/dL (ref 30.0–36.0)
MCV: 87.3 fL (ref 78.0–100.0)
Platelets: 207 10*3/uL (ref 150–400)
RBC: 4.66 MIL/uL (ref 4.22–5.81)
RDW: 12.8 % (ref 11.5–15.5)
WBC: 9.4 10*3/uL (ref 4.0–10.5)

## 2010-04-29 LAB — HEPARIN LEVEL (UNFRACTIONATED): Heparin Unfractionated: <0.1 IU/mL — ABNORMAL LOW (ref 0.30–0.70)

## 2010-04-29 LAB — GLUCOSE, CAPILLARY: Glucose-Capillary: 196 mg/dL — ABNORMAL HIGH (ref 70–99)

## 2010-05-16 NOTE — Discharge Summary (Signed)
  NAME:  [name], [name] NO.:  [id number]  MEDICAL RECORD NO.:  [id number]           PATIENT TYPE:  I  LOCATION:  2922                         FACILITY:  [facility]  PHYSICIAN:  Theressa Millard, M.D.    DATE OF BIRTH:  [birth year]  DATE OF ADMISSION:  04/26/2010 DATE OF DISCHARGE:  04/29/2010                              DISCHARGE SUMMARY   ADMITTING DIAGNOSIS:  Chest pain.  DISCHARGE DIAGNOSES: 1. Inferior wall myocardial infarction, completed. 2. Poorly controlled diabetes. 3. Hypertension. 4. Sleep apnea. 5. Attention deficit disorder. 6. Hypertension. 7. Gout. 8. Hyperlipidemia.  The patient is a 50 year old white male who has a history of diabetes mellitus and has not been taking care of himself very well.  He has been taking his antihypertensives.  He presented with chest discomfort.  HOSPITAL COURSE:  The patient was admitted on the basis of serial enzymes and EKGs.  He had evidence of an inferior wall myocardial infarction.  He was seen in consultation by Cardiology on April 28, 2010.  Dr. Katrinka Blazing performed coronary arteriography, which showed a 90% distal left circumflex lesion.  This applied a small territory. However, there was a 99% right coronary artery lesion that had TIMI II flow.  This underwent percutaneous intervention with stenting and resolution of obstruction.  He did well post procedure loss, and he was discharged in improved condition.  Please note that his hemoglobin A1c was 11.9 on admission.  It had been 8.3 in the office in November 2011.  DISCHARGE MEDICATIONS: 1. Aspirin 325 mg daily. 2. Plavix 75 mg daily. 3. Citalopram 40 mg daily. 4. Metformin 500 mg 2 tablets twice daily with food. 5. Crestor 10 mg daily. 6. Exforge 10/320 once daily. 7. Hydrochlorothiazide 25 mg daily. 8. Topamax 100 mg daily.  FOLLOWUP:  We will make arrangements for the patient to be seen in followup by Dr. Katrinka Blazing as well as by Dr. Kevan Ny.  He should  check his blood sugar once a day at home.  A prescription was given for glucose test meter and strips.  DIET:  Modified carbohydrate.  No added salt.  ACTIVITY:  Increase activity slowly.     Theressa Millard, M.D.     JO/MEDQ  D:  04/29/2010  T:  04/30/2010  Job:  [id number]  Electronically Signed by Theressa Millard M.D. on 05/16/2010 65:78:46 AM

## 2010-05-22 ENCOUNTER — Encounter (HOSPITAL_COMMUNITY): Payer: BC Managed Care – PPO

## 2010-05-24 ENCOUNTER — Encounter (HOSPITAL_COMMUNITY): Payer: BC Managed Care – PPO

## 2010-05-26 ENCOUNTER — Other Ambulatory Visit: Payer: Self-pay | Admitting: Interventional Cardiology

## 2010-05-26 ENCOUNTER — Encounter (HOSPITAL_COMMUNITY): Payer: BC Managed Care – PPO | Attending: Interventional Cardiology

## 2010-05-26 DIAGNOSIS — G4733 Obstructive sleep apnea (adult) (pediatric): Secondary | ICD-10-CM | POA: Insufficient documentation

## 2010-05-26 DIAGNOSIS — E119 Type 2 diabetes mellitus without complications: Secondary | ICD-10-CM | POA: Insufficient documentation

## 2010-05-26 DIAGNOSIS — Z5189 Encounter for other specified aftercare: Secondary | ICD-10-CM | POA: Insufficient documentation

## 2010-05-26 DIAGNOSIS — I2119 ST elevation (STEMI) myocardial infarction involving other coronary artery of inferior wall: Secondary | ICD-10-CM | POA: Insufficient documentation

## 2010-05-26 DIAGNOSIS — E785 Hyperlipidemia, unspecified: Secondary | ICD-10-CM | POA: Insufficient documentation

## 2010-05-26 DIAGNOSIS — I2510 Atherosclerotic heart disease of native coronary artery without angina pectoris: Secondary | ICD-10-CM | POA: Insufficient documentation

## 2010-05-26 DIAGNOSIS — Z9861 Coronary angioplasty status: Secondary | ICD-10-CM | POA: Insufficient documentation

## 2010-05-26 DIAGNOSIS — F988 Other specified behavioral and emotional disorders with onset usually occurring in childhood and adolescence: Secondary | ICD-10-CM | POA: Insufficient documentation

## 2010-05-26 DIAGNOSIS — I10 Essential (primary) hypertension: Secondary | ICD-10-CM | POA: Insufficient documentation

## 2010-05-26 LAB — GLUCOSE, CAPILLARY: Glucose-Capillary: 106 mg/dL — ABNORMAL HIGH (ref 70–99)

## 2010-05-29 ENCOUNTER — Other Ambulatory Visit: Payer: Self-pay | Admitting: Interventional Cardiology

## 2010-05-29 ENCOUNTER — Encounter (HOSPITAL_COMMUNITY): Payer: BC Managed Care – PPO

## 2010-05-29 LAB — GLUCOSE, CAPILLARY
Glucose-Capillary: 137 mg/dL — ABNORMAL HIGH (ref 70–99)
Glucose-Capillary: 96 mg/dL (ref 70–99)

## 2010-05-31 ENCOUNTER — Encounter (HOSPITAL_COMMUNITY): Payer: BC Managed Care – PPO

## 2010-06-02 ENCOUNTER — Other Ambulatory Visit: Payer: Self-pay | Admitting: Interventional Cardiology

## 2010-06-02 ENCOUNTER — Encounter (HOSPITAL_COMMUNITY): Payer: BC Managed Care – PPO

## 2010-06-02 LAB — GLUCOSE, CAPILLARY: Glucose-Capillary: 107 mg/dL — ABNORMAL HIGH (ref 70–99)

## 2010-06-02 NOTE — Consult Note (Signed)
NAME:  James, Ross NO.:  [id number]  MEDICAL RECORD NO.:  [id number]           PATIENT TYPE:  I  LOCATION:  2609                         FACILITY:  [facility]  PHYSICIAN:  Lyn Records, M.D.   DATE OF BIRTH:  11-22-1960  DATE OF CONSULTATION: DATE OF DISCHARGE:                                CONSULTATION   PRIMARY PHYSICIAN:  Candyce Churn, MD  REASON FOR CONSULTATION:  Myocardial infarction.  CONCLUSIONS: 1. ST-elevation myocardial infarction, now completed with inferior Q-     waves.     a.     No current chest pain. 2. Diabetes mellitus, poorly controlled, hemoglobin A1c greater than     11. 3. Hypertension. 4. Obesity. 5. Positive family history.  PLAN: 1. IV heparin. 2. Aspirin. 3. Statin therapy. 4. Beta-blocker therapy. 5. IV nitroglycerin. 6. Coronary angiography in a.m. 7. N.p.o. after midnight. 8. If recurrent chest pain, please consider the patient for urgent     catheterization.  COMMENT:  The patient is very pleasant 50 year old gentleman who is well- known to me.  I have taken care of his father for years.  He suddenly developed chest discomfort yesterday afternoon with some radiation into the right arm.  He characterizes it as a midsternal "indigestion."  The discomfort lasted from 1 o'clock until 8 o'clock last evening.  In the emergency room, he had inferior ST elevation with reciprocal T-wave abnormality in leads aVL and V1 and V2.  He was admitted to the hospital to rule out myocardial infarction.  Cardiac markers have returned abnormal.  He has had no recurrent pain since it resolved yesterday.  He has never had previous discomfort of similar nature.  SIGNIFICANT MEDICAL PROBLEMS: 1. Diabetes. 2. Obstructive sleep apnea. 3. Hypertension. 4. Attention deficit disorder.  Medications include: 1. Hydrochlorothiazide 12.5 mg per day. 2. Metformin 500 mg daily. 3. Crestor 10 mg daily. 4. Citalopram 20 mg per day. 5.  Exforge 10/320 mg per day. 6. Topamax.  ALLERGIES:  None.  HABITS:  Never been a smoker.  He drinks alcohol, perhaps a six-pack per weekend.  FAMILY HISTORY:  His father has had multiple myocardial infarctions starting at an early age.  He is currently 75.  His mother died of breast cancer.  PHYSICAL EXAMINATION:  GENERAL:  The patient is obese.  He is lying flat in bed.  He is in no distress. VITAL SIGNS:  Blood pressure 130/70, heart rate 70. LUNGS:  Clear. CARDIAC:  No gallop, rub, click, or murmur. ABDOMEN:  Soft, significantly obese. EXTREMITIES:  No edema.  Radial pulses and posterior tibial pulses are 2+ and symmetric bilaterally. NEUROLOGIC:  Unremarkable.  EKGs on admission revealed one-quarter to one-half millimeter of ST- segment elevation in II, III, and aVF with reciprocal changes in aVL, V2, and V1.  EKG this morning demonstrates well-formed Q-waves inferiorly with resolution of reciprocal change.  Cardiac markers demonstrate total CPK of 163 with an MB of 15.6 on admission and a troponin of 0.44.  The second set of markers demonstrate an elevation in the troponin to 8.7.  The CK-MB is 82.6 with a  total of 576.  The patient's LDL cholesterol was 88.  HDL is 38.  Chest x-ray reveals no acute abnormality.  DISCUSSION:  This patient has a strong family history, hypertension, sleep apnea, and diabetes with poor control.  He presented yesterday with a subtle, but definite ST-elevation inferior myocardial infarction. He is not acute out inferiorly.  I would recommend that we perform coronary angiography to define anatomy and help guide further management in this young patient.  I have discussed this with him including the risks.  He prefers to think about this and we will proceed on the a.m. of April 28, 2010, if he consents.     Lyn Records, M.D.     HWS/MEDQ  D:  04/27/2010  T:  04/28/2010  Job:  [id number]  cc:   Candyce Churn, M.D. [facility]  [facility].  Electronically Signed by Verdis Prime M.D. on 06/02/2010 04:53:47 PM

## 2010-06-02 NOTE — Cardiovascular Report (Signed)
NAME:  [name], STFORT NO.:  [id number]  MEDICAL RECORD NO.:  [id number]           PATIENT TYPE:  I  LOCATION:  2922                         FACILITY:  [facility]  PHYSICIAN:  Lyn Records, M.D.   DATE OF BIRTH:  09-28-1960  DATE OF PROCEDURE:  04/28/2010 DATE OF DISCHARGE:                           CARDIAC CATHETERIZATION   INDICATIONS FOR PROCEDURE:  The patient presented late with an inferior infarction.  He is being studied to evaluate anatomy and help guide therapy.  He has multiple risk factors including family history, uncontrolled diabetes, obesity, sleep apnea, hypertension, and hyperlipidemia.  PROCEDURE PERFORMED: 1. Left heart cath via right radial. 2. Coronary angiography. 3. Left ventriculography. 4. RCA-PCI with distal drug-eluting stent implantation.  DESCRIPTION:  The patient was brought to the cath lab.  He was in the postabsorptive state.  Informed consent was obtained.  A 5-French system was placed in the right radial using a micropuncture set.  Diagnostic coronary angiography was performed using a 5-French A2 multipurpose catheter, but ultimately a 5-French 3-cm left Judkins and a 5-French right Judkins #4.  The ventriculogram was performed by hand injection with right Judkins catheter.  We upgraded to a 6-French system after reviewing the anatomy and deciding to treat the distal RCA which was a thrombus-containing lesion with TIMI grade 2 flow.  Inferior wall demonstrated mild-to-moderate hypokinesis.  Angiomax was started.  A 6-French sheath was exchanged over a guidewire. We used a 6-French shepherd's crook right coronary guide system.  After the ACT was documented be greater than 300 following a Angiomax bolus and infusion, we advanced a guidewire into the right coronary.  We were then able to properly position the guide catheter with nice support being obtained.  We then crossed the distal stenosis with the Prowater wire without  difficulty.  We attempted to pass an Express thrombectomy catheter, but could not cross the distal lesion.  We predilated with a 2.0 x 12-mm long apex balloon.  We then did an aggressive thrombectomy run to remove distal thrombus.  This was very successful.  I removing a large block of thrombus just distal to the 99% stenosis.  We then positioned and deployed a 3.0 x 16-mm P Element stent.  We then postdilated with noncompliant balloons up to 3.75 mm diameter and 16 atmospheres.  Multiple doses of intracoronary nitroglycerin was administered.  Distal flow was TIMI grade 3.  No complications occurred. Residual stenosis 0%.  No complications occurred.  Wristband was applied with good hemostasis.  RESULTS: 1. Hemodynamic data:     a.     The aortic pressure 98/68 mmHg.     b.     Left ventricular pressure 99/18 mmHg. 2. Left ventriculography:  LV cavity size is normal.  Inferior wall is     mildly to moderately hypokinetic.  EF is 50%. 3. Coronary angiography.     a.     Left main coronary:  Left main coronary artery is widely     patent.     b.     Left anterior descending coronary:  The left anterior  descending coronary wraps around the left ventricular apex.  It      gives origin to one large branching proximal diagonal and a second      branching second diagonal.  Irregularities was up to 50% narrowing      and noted in the midvessel.     c.     The circumflex artery:  The circumflex coronary artery gives      origin to two good sized proximal obtuse marginal branches and      then continues distally where a third small obtuse marginal branch      arises.  The fourth and fifth obtuse marginal branches arise      distal to a 80%-90% stenosis.  The distal territory is small.      There is TIMI grade 3 flow noted.  There is contrast hang up in a      region of ectasia that is present in front of the smaller distal      branches.     d.     The right coronary:  The right coronary  is dominant.  It      contains a thrombus filled distal lesion that is 99% proximal to      the bifurcation into the PDA and left ventricular branch.  The      posterior wall and PDA branches are large.  TIMI grade 2 flow was      noted distal to the stenosis. 4. PCI:  The distal RCA lesion was reduced from 99% to 0% with TIMI     grade 3 flow and removal of thrombus by catheter thrombectomy     before stenting.  CONCLUSION: 1. Recent inferior infarction with spontaneous recanalization of the     right coronary with a 99% distal thrombus filled lesion.  Inferior     wall motion is still present.  It is felt that there is viable     inferior wall muscle. 2. Successful stenting of this distal lesion from 99% to 0% with TIMI     grade 3 flow. 3. High-grade stenosis in the distal circumflex, subtending a small     area of inferolateral myocardium, supplied by two small obtuse     marginal branches.  This will be treated medically. 4. Mild-to-moderate mid-LAD disease. 5. Mild-to-moderate inferior wall hypokinesis.  PLAN:  Aspirin and Plavix.  Aggressive risk factor modification. Discharge home when cardiac markers trend down significantly.     Lyn Records, M.D.     HWS/MEDQ  D:  04/28/2010  T:  04/29/2010  Job:  [id number]  cc:   Candyce Churn, M.D.  Electronically Signed by Verdis Prime M.D. on 06/02/2010 04:53:53 PM

## 2010-06-05 ENCOUNTER — Encounter (HOSPITAL_COMMUNITY): Payer: BC Managed Care – PPO

## 2010-06-07 ENCOUNTER — Encounter (HOSPITAL_COMMUNITY): Payer: BC Managed Care – PPO

## 2010-06-09 ENCOUNTER — Encounter (HOSPITAL_COMMUNITY): Payer: BC Managed Care – PPO

## 2010-06-12 ENCOUNTER — Encounter (HOSPITAL_COMMUNITY): Payer: BC Managed Care – PPO

## 2010-06-14 ENCOUNTER — Encounter (HOSPITAL_COMMUNITY): Payer: BC Managed Care – PPO | Attending: Interventional Cardiology

## 2010-06-14 DIAGNOSIS — G4733 Obstructive sleep apnea (adult) (pediatric): Secondary | ICD-10-CM | POA: Insufficient documentation

## 2010-06-14 DIAGNOSIS — I2119 ST elevation (STEMI) myocardial infarction involving other coronary artery of inferior wall: Secondary | ICD-10-CM | POA: Insufficient documentation

## 2010-06-14 DIAGNOSIS — F988 Other specified behavioral and emotional disorders with onset usually occurring in childhood and adolescence: Secondary | ICD-10-CM | POA: Insufficient documentation

## 2010-06-14 DIAGNOSIS — Z9861 Coronary angioplasty status: Secondary | ICD-10-CM | POA: Insufficient documentation

## 2010-06-14 DIAGNOSIS — I2510 Atherosclerotic heart disease of native coronary artery without angina pectoris: Secondary | ICD-10-CM | POA: Insufficient documentation

## 2010-06-14 DIAGNOSIS — E119 Type 2 diabetes mellitus without complications: Secondary | ICD-10-CM | POA: Insufficient documentation

## 2010-06-14 DIAGNOSIS — I10 Essential (primary) hypertension: Secondary | ICD-10-CM | POA: Insufficient documentation

## 2010-06-14 DIAGNOSIS — E785 Hyperlipidemia, unspecified: Secondary | ICD-10-CM | POA: Insufficient documentation

## 2010-06-14 DIAGNOSIS — Z5189 Encounter for other specified aftercare: Secondary | ICD-10-CM | POA: Insufficient documentation

## 2010-06-16 ENCOUNTER — Encounter (HOSPITAL_COMMUNITY): Payer: BC Managed Care – PPO

## 2010-06-19 ENCOUNTER — Encounter (HOSPITAL_COMMUNITY): Payer: BC Managed Care – PPO

## 2010-06-21 ENCOUNTER — Encounter (HOSPITAL_COMMUNITY): Payer: BC Managed Care – PPO

## 2010-06-23 ENCOUNTER — Other Ambulatory Visit: Payer: Self-pay | Admitting: Interventional Cardiology

## 2010-06-23 ENCOUNTER — Encounter (HOSPITAL_COMMUNITY): Payer: BC Managed Care – PPO

## 2010-06-23 LAB — GLUCOSE, CAPILLARY: Glucose-Capillary: 123 mg/dL — ABNORMAL HIGH (ref 70–99)

## 2010-06-26 ENCOUNTER — Encounter (HOSPITAL_COMMUNITY): Payer: BC Managed Care – PPO

## 2010-06-28 ENCOUNTER — Encounter (HOSPITAL_COMMUNITY): Payer: BC Managed Care – PPO

## 2010-06-30 ENCOUNTER — Encounter (HOSPITAL_COMMUNITY): Payer: BC Managed Care – PPO

## 2010-07-03 ENCOUNTER — Encounter (HOSPITAL_COMMUNITY): Payer: BC Managed Care – PPO

## 2010-07-05 ENCOUNTER — Encounter (HOSPITAL_COMMUNITY): Payer: BC Managed Care – PPO

## 2010-07-07 ENCOUNTER — Encounter (HOSPITAL_COMMUNITY): Payer: BC Managed Care – PPO

## 2010-07-10 ENCOUNTER — Encounter (HOSPITAL_COMMUNITY): Payer: BC Managed Care – PPO

## 2010-07-12 ENCOUNTER — Encounter (HOSPITAL_COMMUNITY): Payer: BC Managed Care – PPO

## 2010-07-14 ENCOUNTER — Encounter (HOSPITAL_COMMUNITY): Payer: BC Managed Care – PPO | Attending: Interventional Cardiology

## 2010-07-14 DIAGNOSIS — Z9861 Coronary angioplasty status: Secondary | ICD-10-CM | POA: Insufficient documentation

## 2010-07-14 DIAGNOSIS — Z5189 Encounter for other specified aftercare: Secondary | ICD-10-CM | POA: Insufficient documentation

## 2010-07-14 DIAGNOSIS — I2119 ST elevation (STEMI) myocardial infarction involving other coronary artery of inferior wall: Secondary | ICD-10-CM | POA: Insufficient documentation

## 2010-07-14 DIAGNOSIS — G4733 Obstructive sleep apnea (adult) (pediatric): Secondary | ICD-10-CM | POA: Insufficient documentation

## 2010-07-14 DIAGNOSIS — E785 Hyperlipidemia, unspecified: Secondary | ICD-10-CM | POA: Insufficient documentation

## 2010-07-14 DIAGNOSIS — I2510 Atherosclerotic heart disease of native coronary artery without angina pectoris: Secondary | ICD-10-CM | POA: Insufficient documentation

## 2010-07-14 DIAGNOSIS — E119 Type 2 diabetes mellitus without complications: Secondary | ICD-10-CM | POA: Insufficient documentation

## 2010-07-14 DIAGNOSIS — F988 Other specified behavioral and emotional disorders with onset usually occurring in childhood and adolescence: Secondary | ICD-10-CM | POA: Insufficient documentation

## 2010-07-14 DIAGNOSIS — I10 Essential (primary) hypertension: Secondary | ICD-10-CM | POA: Insufficient documentation

## 2010-07-17 ENCOUNTER — Encounter (HOSPITAL_COMMUNITY): Payer: BC Managed Care – PPO

## 2010-07-19 ENCOUNTER — Encounter (HOSPITAL_COMMUNITY): Payer: BC Managed Care – PPO

## 2010-07-21 ENCOUNTER — Encounter (HOSPITAL_COMMUNITY): Payer: BC Managed Care – PPO

## 2010-07-24 ENCOUNTER — Encounter (HOSPITAL_COMMUNITY): Payer: BC Managed Care – PPO

## 2010-07-26 ENCOUNTER — Encounter (HOSPITAL_COMMUNITY): Payer: BC Managed Care – PPO

## 2010-07-28 ENCOUNTER — Encounter (HOSPITAL_COMMUNITY): Payer: BC Managed Care – PPO

## 2010-07-31 ENCOUNTER — Encounter (HOSPITAL_COMMUNITY): Payer: BC Managed Care – PPO

## 2010-08-02 ENCOUNTER — Encounter (HOSPITAL_COMMUNITY): Payer: BC Managed Care – PPO

## 2010-08-04 ENCOUNTER — Encounter (HOSPITAL_COMMUNITY): Payer: BC Managed Care – PPO

## 2010-08-07 ENCOUNTER — Encounter (HOSPITAL_COMMUNITY): Payer: BC Managed Care – PPO

## 2010-08-09 ENCOUNTER — Encounter (HOSPITAL_COMMUNITY): Payer: BC Managed Care – PPO

## 2010-08-11 ENCOUNTER — Encounter (HOSPITAL_COMMUNITY): Payer: BC Managed Care – PPO

## 2010-08-14 ENCOUNTER — Encounter (HOSPITAL_COMMUNITY): Payer: BC Managed Care – PPO | Attending: Interventional Cardiology

## 2010-08-14 DIAGNOSIS — E785 Hyperlipidemia, unspecified: Secondary | ICD-10-CM | POA: Insufficient documentation

## 2010-08-14 DIAGNOSIS — I2510 Atherosclerotic heart disease of native coronary artery without angina pectoris: Secondary | ICD-10-CM | POA: Insufficient documentation

## 2010-08-14 DIAGNOSIS — Z9861 Coronary angioplasty status: Secondary | ICD-10-CM | POA: Insufficient documentation

## 2010-08-14 DIAGNOSIS — Z5189 Encounter for other specified aftercare: Secondary | ICD-10-CM | POA: Insufficient documentation

## 2010-08-14 DIAGNOSIS — I2119 ST elevation (STEMI) myocardial infarction involving other coronary artery of inferior wall: Secondary | ICD-10-CM | POA: Insufficient documentation

## 2010-08-14 DIAGNOSIS — E119 Type 2 diabetes mellitus without complications: Secondary | ICD-10-CM | POA: Insufficient documentation

## 2010-08-14 DIAGNOSIS — I10 Essential (primary) hypertension: Secondary | ICD-10-CM | POA: Insufficient documentation

## 2010-08-14 DIAGNOSIS — G4733 Obstructive sleep apnea (adult) (pediatric): Secondary | ICD-10-CM | POA: Insufficient documentation

## 2010-08-14 DIAGNOSIS — F988 Other specified behavioral and emotional disorders with onset usually occurring in childhood and adolescence: Secondary | ICD-10-CM | POA: Insufficient documentation

## 2010-08-16 ENCOUNTER — Encounter (HOSPITAL_COMMUNITY): Payer: BC Managed Care – PPO

## 2010-08-18 ENCOUNTER — Encounter (HOSPITAL_COMMUNITY): Payer: BC Managed Care – PPO

## 2010-08-21 ENCOUNTER — Encounter (HOSPITAL_COMMUNITY): Payer: BC Managed Care – PPO

## 2010-08-23 ENCOUNTER — Encounter (HOSPITAL_COMMUNITY): Payer: BC Managed Care – PPO

## 2010-08-25 ENCOUNTER — Encounter (HOSPITAL_COMMUNITY): Payer: BC Managed Care – PPO

## 2015-06-30 ENCOUNTER — Encounter: Payer: Self-pay | Admitting: Skilled Nursing Facility1

## 2015-06-30 ENCOUNTER — Encounter
Payer: No Typology Code available for payment source | Attending: Internal Medicine | Admitting: Skilled Nursing Facility1

## 2015-06-30 VITALS — Ht 71.0 in | Wt 290.0 lb

## 2015-06-30 DIAGNOSIS — E119 Type 2 diabetes mellitus without complications: Secondary | ICD-10-CM

## 2015-06-30 NOTE — Progress Notes (Signed)
Pt was given One Touch Verio Flex glucometer lot number [id number] x exp: 04/2016

## 2015-06-30 NOTE — Progress Notes (Signed)
Diabetes Self-Management Education  Visit Type: First/Initial  Appt. Start Time: 8:00 Appt. End Time: 9:30  06/30/2015  [name], identified by name and date of birth, is a 55 y.o. male with a diagnosis of Diabetes: Type 2.   ASSESSMENT  Height  (1.803 m), weight 290 lb (131.543 kg). Body mass index is 40.46 kg/(m^2).  Pt states he is very aware of his own body/symptoms. Pt states he used to eat out for breakfast every day but not anymore.       Diabetes Self-Management Education - 06/30/15 0757    Visit Information   Visit Type First/Initial   Initial Visit   Diabetes Type Type 2   Are you currently following a meal plan? No   Are you taking your medications as prescribed? Yes   Date Diagnosed unknown   Health Coping   How would you rate your overall health? Fair   Psychosocial Assessment   Patient Belief/Attitude about Diabetes Motivated to manage diabetes   Self-care barriers None   Pre-Education Assessment   Patient understands the diabetes disease and treatment process. Needs Instruction   Patient understands incorporating nutritional management into lifestyle. Needs Instruction   Patient undertands incorporating physical activity into lifestyle. Needs Instruction   Patient understands using medications safely. Needs Instruction   Patient understands monitoring blood glucose, interpreting and using results Needs Instruction   Patient understands prevention, detection, and treatment of acute complications. Needs Instruction   Patient understands prevention, detection, and treatment of chronic complications. Needs Instruction   Patient understands how to develop strategies to address psychosocial issues. Needs Instruction   Patient understands how to develop strategies to promote health/change behavior. Needs Instruction   Complications   Last HgB A1C per patient/outside source 9 %   How often do you check your blood sugar? 0 times/day (not testing)   Have you had a dilated eye exam in the past 12 months? Yes   Have you had a dental exam in the past 12 months? Yes   Are you checking your feet? Yes   How many days per week are you checking your feet? 7   Dietary Intake   Breakfast oatmeal with fruit   Lunch fast food   Dinner Stromboli----soup and sandwich----rotissiere chicken and vegetables    Snack (evening) cheese, nuts, crackers   Beverage(s) diet soda, lemonade, water, skim milk   Exercise   Exercise Type Light (walking / raking leaves)   How many days per week to you exercise? 2   How many minutes per day do you exercise? 30   Total minutes per week of exercise 60   Patient Education   Previous Diabetes Education Yes (please comment)  2012: not much is rembered    Disease state  Definition of diabetes, type 1 and 2, and the diagnosis of diabetes;Factors that contribute to the development of diabetes   Nutrition management  Role of diet in the treatment of diabetes and the relationship between the three main macronutrients and blood glucose level;Food label reading, portion sizes and measuring food.;Carbohydrate counting;Reviewed blood glucose goals for pre and post meals and how to evaluate the patients' food intake on their blood glucose level.;Information on hints to eating out and maintain blood glucose control.;Meal options for control of blood glucose level and chronic complications.   Physical activity and exercise  Role of exercise on diabetes management, blood pressure control and cardiac health.;Identified with patient nutritional and/or medication changes necessary with exercise.;Helped patient identify appropriate exercises  in relation to his/her diabetes, diabetes complications and other health issue.   Monitoring Purpose and frequency of SMBG.;Taught/discussed recording of test results and interpretation of SMBG.;Identified appropriate SMBG and/or A1C goals.;Daily foot exams;Yearly dilated eye exam   Chronic complications  Relationship between chronic complications and blood glucose control;Assessed and discussed foot care and prevention of foot problems;Lipid levels, blood glucose control and heart disease;Retinopathy and reason for yearly dilated eye exams;Reviewed with patient heart disease, higher risk of, and prevention;Identified and discussed with patient  current chronic complications   Psychosocial adjustment Worked with patient to identify barriers to care and solutions   Individualized Goals (developed by patient)   Nutrition Follow meal plan discussed;General guidelines for healthy choices and portions discussed;Adjust meds/carbs with exercise as discussed   Physical Activity 30 minutes per day;Exercise 3-5 times per week   Monitoring  test my blood glucose as discussed;test blood glucose pre and post meals as discussed   Reducing Risk increase portions of nuts and seeds;do foot checks daily   Post-Education Assessment   Patient understands the diabetes disease and treatment process. Demonstrates understanding / competency   Patient understands incorporating nutritional management into lifestyle. Demonstrates understanding / competency   Patient undertands incorporating physical activity into lifestyle. Demonstrates understanding / competency   Patient understands using medications safely. Demonstrates understanding / competency   Patient understands monitoring blood glucose, interpreting and using results Demonstrates understanding / competency   Patient understands prevention, detection, and treatment of acute complications. Demonstrates understanding / competency   Patient understands prevention, detection, and treatment of chronic complications. Demonstrates understanding / competency   Patient understands how to develop strategies to address psychosocial issues. Demonstrates understanding / competency   Patient understands how to develop strategies to promote health/change behavior. Demonstrates  understanding / competency   Outcomes   Expected Outcomes Demonstrated interest in learning. Expect positive outcomes   Future DMSE PRN   Program Status Completed      Individualized Plan for Diabetes Self-Management Training:   Learning Objective:  Patient will have a greater understanding of diabetes self-management. Patient education plan is to attend individual and/or group sessions per assessed needs and concerns.   Plan:   There are no Patient Instructions on file for this visit.  Expected Outcomes:  Demonstrated interest in learning. Expect positive outcomes  Education material provided: Living Well with Diabetes, Meal plan card and Snack sheet  If problems or questions, patient to contact team via:  Phone  Future DSME appointment: PRN

## 2015-09-26 ENCOUNTER — Other Ambulatory Visit: Payer: Self-pay | Admitting: Internal Medicine

## 2015-09-26 ENCOUNTER — Ambulatory Visit
Admission: RE | Admit: 2015-09-26 | Discharge: 2015-09-26 | Disposition: A | Discharge status: Home / Self Care | Payer: No Typology Code available for payment source | Source: Ambulatory Visit | Attending: Internal Medicine | Admitting: Internal Medicine

## 2015-09-26 DIAGNOSIS — R05 Cough: Secondary | ICD-10-CM

## 2015-09-26 DIAGNOSIS — R059 Cough, unspecified: Secondary | ICD-10-CM

## 2015-10-03 ENCOUNTER — Encounter (INDEPENDENT_AMBULATORY_CARE_PROVIDER_SITE_OTHER): Payer: Self-pay

## 2015-10-03 ENCOUNTER — Ambulatory Visit (INDEPENDENT_AMBULATORY_CARE_PROVIDER_SITE_OTHER): Payer: No Typology Code available for payment source | Admitting: Podiatry

## 2015-10-03 ENCOUNTER — Encounter: Payer: Self-pay | Admitting: Podiatry

## 2015-10-03 VITALS — BP 144/93 | HR 90 | Resp 14

## 2015-10-03 DIAGNOSIS — L97511 Non-pressure chronic ulcer of other part of right foot limited to breakdown of skin: Secondary | ICD-10-CM

## 2015-10-03 DIAGNOSIS — E1140 Type 2 diabetes mellitus with diabetic neuropathy, unspecified: Secondary | ICD-10-CM

## 2015-10-03 DIAGNOSIS — L89891 Pressure ulcer of other site, stage 1: Secondary | ICD-10-CM | POA: Diagnosis not present

## 2015-10-03 NOTE — Progress Notes (Signed)
   Subjective:    Patient ID: [name], male    DOB: April 24, 1960, 55 y.o.   MRN: [id number]  HPI this patient presents to the office per referral from his medical doctor. He presents the office concerned about his fungus toenails, but is noted that there are skin lesions on the tip of his great toe, right foot and on the tip of his second toe right foot. He says that he knows he wore the wrong shoe. 6 months ago and this area has not healed since that time. He says he was treated with cephalexin one week ago for an insect bite on his right leg. He also showed these to areas to his medical doctor who told him to make an appointment with this office for evaluation and treatment. He has occasionally soaked his right foot as well as occasionally bandaged his right foot. He presents the office today for an evaluation and treatment of this condition.  This patient is a diabetic and he admits to the lack of pain due to his neuropathy    Review of Systems  All other systems reviewed and are negative.      Objective:   Physical Exam GENERAL APPEARANCE: Alert, conversant. Appropriately groomed. No acute distress.  VASCULAR: Pedal pulses are  palpable at  [facility] and PT bilateral.  Capillary refill time is immediate to all digits,  Normal temperature gradient.  Digital hair growth is present bilateral  NEUROLOGIC: sensation is ABSENT  to 5.07 monofilament at 5/5 sites bilateral.  Light touch is DIMINISHED  bilateral, Muscle strength normal.  MUSCULOSKELETAL: acceptable muscle strength, tone and stability bilateral.  Intrinsic muscluature intact bilateral.  Rectus appearance of foot and digits noted bilateral.   DERMATOLOGIC: skin color, texture, and turgor are within normal limits. The distal aspect of his right great toe has a healed hemorrhagic callus noted. No evidence of any redness, swelling or infection. Examination of the second toe does reveal the presence of a 5 x 5 mm ulcer. No malodor noted. No  drainage noted. There is redness and inflammation noted on the dorsal aspect of the toe, proximal to the nail         Assessment & Plan:  Diabetic Ulcer right foot.  Diabetic neuropathy.   Treatment  IE debridement of callus right hallux. Debridement of necrotic tissue on the distal aspect of the second toe. Silvadene dry sterile dressing was applied. Patient was given home instructions and told to bandage his toe until the toe has healed. He is to walk in a surgical shoe, which is not here in the office, but we will bring one this coming Wednesday. In the meantime, I told him to wear sandals. Doxycycline was also prescribed for this patient. He is to return the office in 3 weeks for evaluation and treatment   Helane GuntherGregory Mayer DPM

## 2015-10-24 ENCOUNTER — Encounter: Payer: Self-pay | Admitting: Podiatry

## 2015-10-24 ENCOUNTER — Ambulatory Visit: Payer: No Typology Code available for payment source | Admitting: Podiatry

## 2015-10-24 VITALS — BP 153/91 | HR 93 | Resp 14

## 2015-10-24 DIAGNOSIS — L97511 Non-pressure chronic ulcer of other part of right foot limited to breakdown of skin: Secondary | ICD-10-CM

## 2015-10-24 DIAGNOSIS — E1140 Type 2 diabetes mellitus with diabetic neuropathy, unspecified: Secondary | ICD-10-CM

## 2015-10-24 DIAGNOSIS — L89891 Pressure ulcer of other site, stage 1: Secondary | ICD-10-CM | POA: Diagnosis not present

## 2015-10-24 NOTE — Progress Notes (Signed)
   Subjective:    Patient ID: [name] Tech, male    DOB: 14-Feb-1960, 55 y.o.   MRN: [id number]  HPI this patient presents to the office per referral from his medical doctor.  He was diagnosed with ulcer great toe and second toe right foot.  His ulcer on great toe has closed with no evidence of infection.  His second toe tip has been treated with soaks and bandaging.  He was prescribed doxycycline last visit.  He presents today with no pain and the toe has little drainage with no bleeding.  He has continued redness at the proximal nail fold.    This patient is a diabetic and he admits to the lack of pain due to his neuropathy.  He returns for continued evaluation and treatment.    Review of Systems  All other systems reviewed and are negative.      Objective:   Physical Exam GENERAL APPEARANCE: Alert, conversant. Appropriately groomed. No acute distress.  VASCULAR: Pedal pulses are  palpable at  [facility].  Capillary refill time is immediate to all digits,  Normal temperature gradient.  Digital hair growth is present bilateral  NEUROLOGIC: sensation is ABSENT  to 5.07 monofilament at 5/5 sites bilateral.  Light touch is DIMINISHED  bilateral, Muscle strength normal.  MUSCULOSKELETAL: acceptable muscle strength, tone and stability bilateral.  Intrinsic muscluature intact bilateral.  Rectus appearance of foot and digits noted bilateral. Rigid hammer toe deformity with rigid mallet toe second toe right foot.  DERMATOLOGIC: skin color, texture, and turgor are within normal limits. The distal aspect of his right great toe has a healed . No evidence of any redness, swelling or infection. Examination of the second toe does reveal the presence of a  Pinhole ulcer at the distal tip second toe right.  There is masceration with no drainage or infection second toe right. No malodor noted. No drainage noted. There is redness and inflammation noted on the dorsal aspect of the toe, proximal to the  nail         Assessment & Plan:  Diabetic Ulcer right foot.  Diabetic neuropathy.   Treatment  IE debridement of callus right hallux. Debridement of necrotic tissue on the distal aspect of the second toe. Neosporin dry sterile dressing was applied. Patient was given home instructions and told to bandage his toe until the toe has healed. Doxycycline was also prescribed for this patient. He is to return the office in 4 weeks for evaluation and treatment.  Padding applied to toe.   Helane GuntherGregory Debraann Livingstone DPM

## 2015-11-16 ENCOUNTER — Encounter: Payer: Self-pay | Admitting: Pulmonary Disease

## 2015-11-16 ENCOUNTER — Other Ambulatory Visit (INDEPENDENT_AMBULATORY_CARE_PROVIDER_SITE_OTHER): Payer: No Typology Code available for payment source

## 2015-11-16 ENCOUNTER — Institutional Professional Consult (permissible substitution): Payer: No Typology Code available for payment source | Admitting: Pulmonary Disease

## 2015-11-16 ENCOUNTER — Ambulatory Visit (INDEPENDENT_AMBULATORY_CARE_PROVIDER_SITE_OTHER): Payer: No Typology Code available for payment source | Admitting: Pulmonary Disease

## 2015-11-16 VITALS — BP 136/78 | HR 109 | Ht 71.5 in | Wt 288.0 lb

## 2015-11-16 DIAGNOSIS — E119 Type 2 diabetes mellitus without complications: Secondary | ICD-10-CM | POA: Insufficient documentation

## 2015-11-16 DIAGNOSIS — R059 Cough, unspecified: Secondary | ICD-10-CM

## 2015-11-16 DIAGNOSIS — R600 Localized edema: Secondary | ICD-10-CM | POA: Diagnosis not present

## 2015-11-16 DIAGNOSIS — G4733 Obstructive sleep apnea (adult) (pediatric): Secondary | ICD-10-CM | POA: Diagnosis not present

## 2015-11-16 DIAGNOSIS — R05 Cough: Secondary | ICD-10-CM

## 2015-11-16 DIAGNOSIS — M1990 Unspecified osteoarthritis, unspecified site: Secondary | ICD-10-CM

## 2015-11-16 DIAGNOSIS — E1169 Type 2 diabetes mellitus with other specified complication: Secondary | ICD-10-CM | POA: Insufficient documentation

## 2015-11-16 DIAGNOSIS — E785 Hyperlipidemia, unspecified: Secondary | ICD-10-CM | POA: Insufficient documentation

## 2015-11-16 LAB — CBC WITH DIFFERENTIAL/PLATELET
Basophils Absolute: 0 10*3/uL (ref 0.0–0.1)
Basophils Relative: 0.4 % (ref 0.0–3.0)
Eosinophils Absolute: 0.3 10*3/uL (ref 0.0–0.7)
Eosinophils Relative: 2.9 % (ref 0.0–5.0)
HCT: 47.8 % (ref 39.0–52.0)
Hemoglobin: 16.3 g/dL (ref 13.0–17.0)
Lymphocytes Relative: 25 % (ref 12.0–46.0)
Lymphs Abs: 2.4 10*3/uL (ref 0.7–4.0)
MCHC: 34.1 g/dL (ref 30.0–36.0)
MCV: 87.2 fl (ref 78.0–100.0)
Monocytes Absolute: 0.7 10*3/uL (ref 0.1–1.0)
Monocytes Relative: 7.4 % (ref 3.0–12.0)
Neutro Abs: 6.2 10*3/uL (ref 1.4–7.7)
Neutrophils Relative %: 64.3 % (ref 43.0–77.0)
Platelets: 291 10*3/uL (ref 150.0–400.0)
RBC: 5.48 Mil/uL (ref 4.22–5.81)
RDW: 13 % (ref 11.5–15.5)
WBC: 9.7 10*3/uL (ref 4.0–10.5)

## 2015-11-16 LAB — SEDIMENTATION RATE: Sed Rate: 26 mm/hr — ABNORMAL HIGH (ref 0–20)

## 2015-11-16 MED ORDER — TRAMADOL HCL 50 MG PO TABS: 25.0000 mg | ORAL_TABLET | Freq: Four times a day (QID) | ORAL | 0 refills | Status: DC | PRN

## 2015-11-16 NOTE — Progress Notes (Signed)
Subjective:    Patient ID: [name], male    DOB: [birth year], 55 y.o.   MRN: [id number]  HPI Per records sent to my office patient was last seen on 09/26/15 after being seen the week prior for a probable viral infection with mild bronchospasm. At that time the patient reported he was "coughing all the time". Patient was continued on Breo 200/25 at that appointment. He reports as a child he had no problems breathing or bronchitis. He has had a long history of seasonal allergies. He reports over the last 4-5 year he has had an intermittent cough that is nonproductive. It is present for 7-8 months out of the year. He reports Fall & Spring are his worse seasons. He reports his cough seems to be easier to trigger when eating. No exacerbations of his cough with exposure to inhaled chemicals, fumes, or irritants. He has awoke with a cough at night. Denies any wheezing. Denies any dyspnea. He denies any sinus congestion, pressure or drainage recently. Does report he has symptoms on a seasonal basis. He reports only occasional headaches in his occiput. He reports rare reflux every 1-2 years. No routine morning brash water taste or reflux otherwise. He reports nothing seems to help his cough except with holding a cough drop in his mouth. Nothing else seems to stop or control his cough. He does report his voice seems more raspy with his cough. He reports he did have a rash recently on his right lower lateral leg. He reports "severe" arthritis in both his shoulders. He denies any joint stiffness otherwise.   Review of Systems No chest tightness, pressure or pain. No fever, chills, or sweats. A pertinent 14 point review of systems is negative except as per the history of presenting illness.  No Known Allergies  Current Outpatient Prescriptions on File Prior to Visit  Medication Sig Dispense Refill  . aspirin EC 81 MG tablet Take 81 mg by mouth daily.    . Cetirizine HCl (ZYRTEC ALLERGY PO) Take 10 mg by  mouth daily.    . fluticasone furoate-vilanterol (BREO ELLIPTA) 200-25 MCG/INH AEPB Inhale 1 puff into the lungs daily.    Marland Kitchen glimepiride (AMARYL) 4 MG tablet Take 4 mg by mouth daily with breakfast.     . metFORMIN (GLUCOPHAGE-XR) 500 MG 24 hr tablet Take 1,000 mg by mouth 2 (two) times daily.     . nitroGLYCERIN (NITRO-DUR) 0.4 mg/hr patch Place 0.4 mg onto the skin daily.    Marland Kitchen omeprazole (PRILOSEC) 20 MG capsule Take 20 mg by mouth daily.      No current facility-administered medications on file prior to visit.     Past Medical History:  Diagnosis Date  . Diabetes ([place])   . Hyperlipidemia   . Hypertension   . NSTEMI (non-ST elevated myocardial infarction) ([place])   . Obesity     Past Surgical History:  Procedure Laterality Date  . ACHILLES TENDON REPAIR Right   . heart stent  2012   x1  . SHOULDER ARTHROSCOPY Right   . UVULOPALATOPHARYNGOPLASTY      Family History  Problem Relation Age of Onset  . Cancer Other   . Diabetes Other   . Heart disease Other   . Breast cancer Mother   . Heart disease Father   . Diabetes Father   . Leukemia Brother   . Asthma Maternal Uncle     Social History   Social History  . Marital status: Married  Spouse name: N/A  . Number of children: N/A  . Years of education: N/A   Social History Main Topics  . Smoking status: Passive Smoke Exposure - Never Smoker  . Smokeless tobacco: Never Used     Comment: Both parents growing up.   . Alcohol use Yes     Comment: 4-5 on weekends  . Drug use: No  . Sexual activity: Not Asked   Other Topics Concern  . None   Social History Narrative   Originally from [place], [place]. Previously has lived in [place]. 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 No pets currently. No bird or hot tub exposure. Could have mold in his current home and has lived there for 1.5 years now.       Objective:   Physical Exam BP 136/78 (BP Location: Right Arm, Cuff  Size: Normal)   Pulse (!) 109   Ht 5' 11.5" (1.816 m)   Wt 288 lb (130.6 kg)   SpO2 98%   BMI 39.61 kg/m  General:  Awake. Alert. No acute distress. Moderate obesity.  Integument:  Warm & dry. No rash on exposed skin. No bruising. Lymphatics:  No appreciated cervical or supraclavicular lymphadenoapthy. HEENT:  Moist mucus membranes. No oral ulcers. No scleral injection or icterus. Minimal nasal turbinate swelling. Cardiovascular:  Regular rate. Pitting edema in lower extremities. No appreciable JVD given body habitus.  Pulmonary:  Good aeration & clear to auscultation bilaterally. Symmetric chest wall expansion. No accessory muscle use. Patient did have cough with deep inspiration. Abdomen: Soft. Normal bowel sounds. Nondistended. Grossly nontender. Musculoskeletal:  Normal bulk and tone. Hand grip strength 5/5 bilaterally. No joint effusion appreciated. Mild swan neck deformity of bilateral 5th digit. Neurological:  CN 2-12 grossly in tact. No meningismus. Moving all 4 extremities equally. Symmetric brachioradialis deep tendon reflexes. Psychiatric:  Mood and affect congruent. Speech normal rhythm, rate & tone.   IMAGING CXR PA/LAT 09/26/15 (personally reviewed by me): Low lung volumes. No parenchymal nodule or opacity appreciated. No pleural effusion. Heart normal in size & mediastinum normal in contour.    Assessment & Plan:  55 y.o. male with long-standing and chronic history of cough. It's unclear to me the exact etiology to the patient's cough. With his history of myocardial infarction and stent placement as well as pitting edema in his lower extremities I am concerned about the possibility for decompensated congestive heart failure, especially in the setting of sleep apnea where right ventricular dysfunction/failure could be present. Certainly silent laryngo-esophageal reflux must be considered given history of reflux and will need to be assessed with imaging as well. Asthma is a  possibility but I would expect some symptomatic improvement with inhaler therapy especially the use of inhaled corticosteroids. With his history of secondhand smoke exposure evaluation for possible occult COPD is reasonable. An atypical infection or structural process involving the lung parenchyma is also possible and should be assessed with more detail chest imaging with CT scan given his previously normal chest x-ray. Vocal cord dysfunction is certainly possible and with his history of arthritis evaluation for retinoid cartilage involvement by rheumatoid arthritis will need to be assessed. I instructed the patient to contact my office if he had any new breathing problems or questions before his next appointment.  1. Cough: Attempting cough suppression and treatment with Ultram. Checking full pulmonary function testing before next appointment along with CT scan of the chest without contrast. Screening for  a cold reflux with barium swallow/esophagogram. 2. Arthritis: Checking serum ESR, CRP, rheumatoid factor, ANA, and anti-CCP. 3. Localized Edema: Checking serum BNP, CMP, & transthoracic echocardiogram with contrast. 4. Allergic Rhinitis: Checking serum CBC with differential & RAST panel. 5. OSA: Status post UPPP surgery. Currently nonadherent to CPAP therapy. Plan to readdress after next appointment. 6. Follow-up: Patient to return to clinic in 4 weeks or sooner if needed.  Sonia Baller Ashok Cordia, M.D. [facility] Pulmonary & Critical Care Pager:  [phone] After 3pm or if no response, call [phone] 4:53 PM 11/16/15

## 2015-11-16 NOTE — Patient Instructions (Signed)
   Call or e-mail me if you have any questions or concerns before your next appointment.  We will review your test results at your next appointment.  Use the Ultram/Tramadol that I am prescribing you to help with your cough & arthritis pain.  I will see you back in 4 weeks or sooner if needed.   TESTS ORDERED: 1. Full PFTs before next appointment 2. CT Chest w/o Contrast  3. Complete Echocardiogram w/ Contrast 4. Barium Swallow/Esophagram 5. Serum CMP, BNP, CBC with differential, RAST Panel, ANA, ESR, CRP, Rheumatoid Factor, & Anti-CCP

## 2015-11-17 ENCOUNTER — Other Ambulatory Visit (HOSPITAL_COMMUNITY): Payer: Self-pay | Admitting: Pulmonary Disease

## 2015-11-17 DIAGNOSIS — R1310 Dysphagia, unspecified: Secondary | ICD-10-CM

## 2015-11-17 LAB — RESPIRATORY ALLERGY PROFILE REGION II ~~LOC~~
Allergen, A. alternata, m6: <0.1 kU/L
Allergen, C. Herbarum, M2: <0.1 kU/L
Allergen, Cedar tree, t12: 3.33 kU/L — ABNORMAL HIGH
Allergen, Comm Silver Birch, t9: 6.4 kU/L — ABNORMAL HIGH
Allergen, Cottonwood, t14: 2.23 kU/L — ABNORMAL HIGH
Allergen, D pternoyssinus,d7: 0.26 kU/L — ABNORMAL HIGH
Allergen, Mouse Urine Protein, e78: <0.1 kU/L
Allergen, Mulberry, t76: 0.4 kU/L — ABNORMAL HIGH
Allergen, Oak,t7: 7.58 kU/L — ABNORMAL HIGH
Allergen, P. notatum, m1: <0.1 kU/L
Aspergillus fumigatus, m3: <0.1 kU/L
Bermuda Grass: 4.12 kU/L — ABNORMAL HIGH
Box Elder IgE: 3.48 kU/L — ABNORMAL HIGH
Cat Dander: 1.52 kU/L — ABNORMAL HIGH
Cockroach: <0.1 kU/L
Common Ragweed: 6.86 kU/L — ABNORMAL HIGH
D. farinae: 0.12 kU/L — ABNORMAL HIGH
Dog Dander: 6.35 kU/L — ABNORMAL HIGH
Elm IgE: 2.1 kU/L — ABNORMAL HIGH
IgE (Immunoglobulin E), Serum: 160 kU/L — ABNORMAL HIGH (ref <115)
Johnson Grass: 2.73 kU/L — ABNORMAL HIGH
Pecan/Hickory Tree IgE: 4.34 kU/L — ABNORMAL HIGH
Rough Pigweed  IgE: 3.38 kU/L — ABNORMAL HIGH
Sheep Sorrel IgE: 3.43 kU/L — ABNORMAL HIGH
Timothy Grass: 5.13 kU/L — ABNORMAL HIGH

## 2015-11-17 LAB — COMPREHENSIVE METABOLIC PANEL
ALT: 19 U/L (ref 0–53)
AST: 13 U/L (ref 0–37)
Albumin: 3.8 g/dL (ref 3.5–5.2)
Alkaline Phosphatase: 71 U/L (ref 39–117)
BUN: 17 mg/dL (ref 6–23)
CO2: 26 mEq/L (ref 19–32)
Calcium: 9.1 mg/dL (ref 8.4–10.5)
Chloride: 101 mEq/L (ref 96–112)
Creatinine, Ser: 1.07 mg/dL (ref 0.40–1.50)
GFR: 76.72 mL/min (ref >60.00)
Glucose, Bld: 234 mg/dL — ABNORMAL HIGH (ref 70–99)
Potassium: 4.4 mEq/L (ref 3.5–5.1)
Sodium: 136 mEq/L (ref 135–145)
Total Bilirubin: 0.5 mg/dL (ref 0.2–1.2)
Total Protein: 7.1 g/dL (ref 6.0–8.3)

## 2015-11-17 LAB — BRAIN NATRIURETIC PEPTIDE: Pro B Natriuretic peptide (BNP): 13 pg/mL (ref 0.0–100.0)

## 2015-11-17 LAB — C-REACTIVE PROTEIN: CRP: 0.4 mg/dL — ABNORMAL LOW (ref 0.5–20.0)

## 2015-11-17 LAB — ANA: Anti Nuclear Antibody(ANA): NEGATIVE

## 2015-11-17 LAB — CYCLIC CITRUL PEPTIDE ANTIBODY, IGG: Cyclic Citrullin Peptide Ab: <16 Units

## 2015-11-22 ENCOUNTER — Other Ambulatory Visit: Payer: No Typology Code available for payment source

## 2015-11-22 DIAGNOSIS — M1990 Unspecified osteoarthritis, unspecified site: Secondary | ICD-10-CM

## 2015-11-22 LAB — RHEUMATOID FACTOR

## 2015-11-28 ENCOUNTER — Other Ambulatory Visit: Payer: Self-pay

## 2015-11-28 ENCOUNTER — Ambulatory Visit (INDEPENDENT_AMBULATORY_CARE_PROVIDER_SITE_OTHER)
Admission: RE | Admit: 2015-11-28 | Discharge: 2015-11-28 | Disposition: A | Discharge status: Home / Self Care | Payer: No Typology Code available for payment source | Source: Ambulatory Visit | Attending: Pulmonary Disease | Admitting: Pulmonary Disease

## 2015-11-28 ENCOUNTER — Ambulatory Visit (HOSPITAL_BASED_OUTPATIENT_CLINIC_OR_DEPARTMENT_OTHER): Payer: No Typology Code available for payment source

## 2015-11-28 ENCOUNTER — Other Ambulatory Visit: Payer: No Typology Code available for payment source

## 2015-11-28 ENCOUNTER — Ambulatory Visit (HOSPITAL_COMMUNITY)
Admission: RE | Admit: 2015-11-28 | Discharge: 2015-11-28 | Disposition: A | Discharge status: Home / Self Care | Payer: No Typology Code available for payment source | Source: Ambulatory Visit | Attending: Pulmonary Disease | Admitting: Pulmonary Disease

## 2015-11-28 ENCOUNTER — Other Ambulatory Visit: Payer: Self-pay | Admitting: Pulmonary Disease

## 2015-11-28 ENCOUNTER — Encounter (HOSPITAL_COMMUNITY): Payer: Self-pay

## 2015-11-28 ENCOUNTER — Ambulatory Visit: Payer: No Typology Code available for payment source | Admitting: Podiatry

## 2015-11-28 DIAGNOSIS — I10 Essential (primary) hypertension: Secondary | ICD-10-CM | POA: Diagnosis not present

## 2015-11-28 DIAGNOSIS — E785 Hyperlipidemia, unspecified: Secondary | ICD-10-CM | POA: Insufficient documentation

## 2015-11-28 DIAGNOSIS — R05 Cough: Secondary | ICD-10-CM

## 2015-11-28 DIAGNOSIS — K219 Gastro-esophageal reflux disease without esophagitis: Secondary | ICD-10-CM

## 2015-11-28 DIAGNOSIS — E119 Type 2 diabetes mellitus without complications: Secondary | ICD-10-CM | POA: Insufficient documentation

## 2015-11-28 DIAGNOSIS — I2510 Atherosclerotic heart disease of native coronary artery without angina pectoris: Secondary | ICD-10-CM | POA: Insufficient documentation

## 2015-11-28 DIAGNOSIS — R1310 Dysphagia, unspecified: Secondary | ICD-10-CM

## 2015-11-28 DIAGNOSIS — R059 Cough, unspecified: Secondary | ICD-10-CM

## 2015-11-28 DIAGNOSIS — R600 Localized edema: Secondary | ICD-10-CM | POA: Diagnosis present

## 2015-11-28 DIAGNOSIS — Z6839 Body mass index (BMI) 39.0-39.9, adult: Secondary | ICD-10-CM | POA: Insufficient documentation

## 2015-11-28 DIAGNOSIS — K449 Diaphragmatic hernia without obstruction or gangrene: Secondary | ICD-10-CM | POA: Insufficient documentation

## 2015-11-28 DIAGNOSIS — M1990 Unspecified osteoarthritis, unspecified site: Secondary | ICD-10-CM

## 2015-11-28 MED ORDER — PERFLUTREN LIPID MICROSPHERE
1.0000 mL | INTRAVENOUS | Status: AC | PRN
  Administered 2015-11-28: 3 mL via INTRAVENOUS

## 2015-11-28 NOTE — Progress Notes (Signed)
Pt arrived for a Modified Barium Swallow, but chart review showed Dr. Jamison NeighborNestor specifically stated he wanted an Esophagram in his notes and was concerned about reflux. He could not be reached, but contacted Dr. Isaiah SergeMannam who ordered an esophagram which was completed.  Harlon DittyBonnie Rolando Whitby, MA CCC-SLP [phone]

## 2015-11-28 NOTE — Progress Notes (Signed)
Called by Speech and Swallow service that patient was ordered for modified barium swallow but he needs a esophagram to actually evaluate for reflux. New order for esophagogram placed. Will CC Dr. Jamison NeighborNestor as a FYI.

## 2015-11-29 LAB — RHEUMATOID FACTOR: Rhuematoid fact SerPl-aCnc: <14 IU/mL (ref <14)

## 2015-12-01 ENCOUNTER — Other Ambulatory Visit: Payer: Self-pay

## 2015-12-01 MED ORDER — RANITIDINE HCL 150 MG PO TABS: 150.0000 mg | ORAL_TABLET | Freq: Two times a day (BID) | ORAL | 3 refills | Status: DC

## 2015-12-01 MED ORDER — OMEPRAZOLE 20 MG PO CPDR: 40.0000 mg | DELAYED_RELEASE_CAPSULE | Freq: Every day | ORAL | 3 refills | Status: DC

## 2015-12-02 ENCOUNTER — Telehealth: Payer: Self-pay | Admitting: Pulmonary Disease

## 2015-12-02 MED ORDER — RANITIDINE HCL 150 MG PO TABS: 150.0000 mg | ORAL_TABLET | Freq: Two times a day (BID) | ORAL | 3 refills | Status: DC

## 2015-12-02 NOTE — Telephone Encounter (Signed)
Spoke with the pt and apologized for rx being sent incorrectly  I have sent a 30 day supply of zantac to his pharm Nothing further needed

## 2015-12-27 ENCOUNTER — Encounter (INDEPENDENT_AMBULATORY_CARE_PROVIDER_SITE_OTHER): Payer: Self-pay

## 2015-12-27 ENCOUNTER — Ambulatory Visit (INDEPENDENT_AMBULATORY_CARE_PROVIDER_SITE_OTHER): Payer: No Typology Code available for payment source | Admitting: Pulmonary Disease

## 2015-12-27 ENCOUNTER — Ambulatory Visit (HOSPITAL_COMMUNITY)
Admission: RE | Admit: 2015-12-27 | Discharge: 2015-12-27 | Disposition: A | Discharge status: Home / Self Care | Payer: No Typology Code available for payment source | Source: Ambulatory Visit | Attending: Pulmonary Disease | Admitting: Pulmonary Disease

## 2015-12-27 ENCOUNTER — Encounter: Payer: Self-pay | Admitting: Pulmonary Disease

## 2015-12-27 VITALS — BP 154/90 | HR 106 | Ht 71.0 in | Wt 290.0 lb

## 2015-12-27 DIAGNOSIS — R05 Cough: Secondary | ICD-10-CM

## 2015-12-27 DIAGNOSIS — R059 Cough, unspecified: Secondary | ICD-10-CM

## 2015-12-27 DIAGNOSIS — G4733 Obstructive sleep apnea (adult) (pediatric): Secondary | ICD-10-CM | POA: Diagnosis not present

## 2015-12-27 DIAGNOSIS — Z029 Encounter for administrative examinations, unspecified: Secondary | ICD-10-CM | POA: Insufficient documentation

## 2015-12-27 DIAGNOSIS — K219 Gastro-esophageal reflux disease without esophagitis: Secondary | ICD-10-CM

## 2015-12-27 DIAGNOSIS — J309 Allergic rhinitis, unspecified: Secondary | ICD-10-CM | POA: Diagnosis not present

## 2015-12-27 LAB — PULMONARY FUNCTION TEST
DL/VA % pred: 122 %
DL/VA: 5.73 ml/min/mmHg/L
DLCO unc % pred: 107 %
DLCO unc: 36.13 ml/min/mmHg
FEF 25-75 Post: 3.81 L/sec
FEF 25-75 Pre: 3.41 L/sec
FEF2575-%Change-Post: 11 %
FEF2575-%Pred-Post: 112 %
FEF2575-%Pred-Pre: 100 %
FEV1-%Change-Post: 4 %
FEV1-%Pred-Post: 87 %
FEV1-%Pred-Pre: 83 %
FEV1-Post: 3.45 L
FEV1-Pre: 3.3 L
FEV1FVC-%Change-Post: 3 %
FEV1FVC-%Pred-Pre: 102 %
FEV6-%Change-Post: 2 %
FEV6-%Pred-Post: 83 %
FEV6-%Pred-Pre: 81 %
FEV6-Post: 4.13 L
FEV6-Pre: 4.04 L
FEV6FVC-%Change-Post: 0 %
FEV6FVC-%Pred-Post: 101 %
FEV6FVC-%Pred-Pre: 102 %
FVC-%Change-Post: 1 %
FVC-%Pred-Post: 82 %
FVC-%Pred-Pre: 81 %
FVC-Post: 4.23 L
FVC-Pre: 4.18 L
Post FEV1/FVC ratio: 82 %
Post FEV6/FVC ratio: 98 %
Pre FEV1/FVC ratio: 79 %
Pre FEV6/FVC Ratio: 98 %
RV % pred: 110 %
RV: 2.38 L
TLC % pred: 92 %
TLC: 6.62 L

## 2015-12-27 MED ORDER — MONTELUKAST SODIUM 10 MG PO TABS: 10.0000 mg | ORAL_TABLET | Freq: Every day | ORAL | 3 refills | Status: DC

## 2015-12-27 MED ORDER — OMEPRAZOLE 40 MG PO CPDR: 40.0000 mg | DELAYED_RELEASE_CAPSULE | Freq: Two times a day (BID) | ORAL | 3 refills | Status: DC

## 2015-12-27 MED ORDER — RANITIDINE HCL 150 MG PO TABS: 150.0000 mg | ORAL_TABLET | Freq: Two times a day (BID) | ORAL | 3 refills | Status: DC

## 2015-12-27 MED ORDER — ALBUTEROL SULFATE (2.5 MG/3ML) 0.083% IN NEBU
2.5000 mg | INHALATION_SOLUTION | Freq: Once | RESPIRATORY_TRACT | Status: AC
  Administered 2015-12-27: 2.5 mg via RESPIRATORY_TRACT

## 2015-12-27 NOTE — Patient Instructions (Signed)
   Keep taking your Claritin daily.   I'm increasing your Prilosec & Zantac to help with your silent reflux.  Try using a nasal saline rinse like NeilMed twice daily.  Call me if your cough is not getting better in the next 4-5 weeks.  I will see you back in 3 months or sooner if needed.

## 2015-12-27 NOTE — Progress Notes (Signed)
Subjective:    Patient ID: [name], male    DOB: Oct 04, 1960, 55 y.o.   MRN: [id number]  C.C.:  Follow-up for Cough, Edema, Chronic Allergic Rhinitis, GERD, & OSA.  HPI Cough: Likely secondary to uncontrolled underlying reflux given esophagram findings. No evidence for airway obstruction on spirometry. He reports his cough is intermittent and seems to be more frequent when eating. He is not waking up at night coughing. He does notice talking seems to aggravate his cough.   Edema: Likely due to venous stasis/insufficiency. No evidence for congestive heart failure on transthoracic echocardiogram.  Chronic Allergic Rhinitis:  Patient has multiple environmental allergens including dogs, grasses, and trees. Reports some sinus drainage but no significant congestion or pressure. He is currently taking Zyrtec.   GERD: Small sliding-type hiatal hernia as well as prominent spontaneous gastroesophageal reflux at least of the thoracic inlet noted on esophagram. Patient's Prilosec continued 40 mg by mouth every morning & started on Zantac 150 milligrams by mouth daily at bedtime after findings on esophagram. He denies any reflux or dyspepsia. No morning brash water taste.   OSA:  S/P UPPP surgery. Previously non-adherent to CPAP therapy.   Review of Systems No chest pain, pressure, or tightness. No fever, chills, or sweats. No abdominal pain, nausea, or emesis.   No Known Allergies  Current Outpatient Prescriptions on File Prior to Visit  Medication Sig Dispense Refill  . aspirin EC 81 MG tablet Take 81 mg by mouth daily.    . Cetirizine HCl (ZYRTEC ALLERGY PO) Take 10 mg by mouth daily.    . fluticasone furoate-vilanterol (BREO ELLIPTA) 200-25 MCG/INH AEPB Inhale 1 puff into the lungs daily.    Marland Kitchen glimepiride (AMARYL) 4 MG tablet Take 4 mg by mouth daily with breakfast.     . metFORMIN (GLUCOPHAGE-XR) 500 MG 24 hr tablet Take 1,000 mg by mouth 2 (two) times daily.     Marland Kitchen omeprazole (PRILOSEC)  20 MG capsule Take 2 capsules (40 mg total) by mouth daily. 60 capsule 3  . ranitidine (ZANTAC) 150 MG tablet Take 1 tablet (150 mg total) by mouth 2 (two) times daily. 60 tablet 3  . traMADol (ULTRAM) 50 MG tablet Take 0.5-1 tablets (25-50 mg total) by mouth every 6 (six) hours as needed. 25 tablet 0   No current facility-administered medications on file prior to visit.     Past Medical History:  Diagnosis Date  . Diabetes ([place])   . Hyperlipidemia   . Hypertension   . NSTEMI (non-ST elevated myocardial infarction) ([place])   . Obesity     Past Surgical History:  Procedure Laterality Date  . ACHILLES TENDON REPAIR Right   . heart stent  2012   x1  . SHOULDER ARTHROSCOPY Right   . UVULOPALATOPHARYNGOPLASTY      Family History  Problem Relation Age of Onset  . Cancer Other   . Diabetes Other   . Heart disease Other   . Breast cancer Mother   . Heart disease Father   . Diabetes Father   . Leukemia Brother   . Asthma Maternal Uncle     Social History   Social History  . Marital status: Married    Spouse name: N/A  . Number of children: N/A  . Years of education: N/A   Social History Main Topics  . Smoking status: Passive Smoke Exposure - Never Smoker  . Smokeless tobacco: Never Used     Comment: Both parents growing up.   Marland Kitchen  Alcohol use Yes     Comment: 4-5 on weekends  . Drug use: No  . Sexual activity: Not Asked   Other Topics Concern  . None   Social History Narrative   Originally from [place], [place]. Previously has lived in [place] & [place]. 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 No pets currently. No bird or hot tub exposure. Could have mold in his current home and has lived there for 1.5 years now.       Objective:   Physical Exam BP (!) 154/90 (BP Location: Left Arm, Cuff Size: Normal)   Pulse (!) 106   Ht 5\' 11"  (1.803 m)   Wt 290 lb (131.5 kg)   SpO2 97%   BMI 40.45 kg/m  General:  Awake. Nodistress.  Moderate obesity.  Integument:  Warm & dry. No rash on exposed skin.  Lymphatics:  No appreciated cervical or supraclavicular lymphadenoapthy. HEENT:  Moist mucus membranes. Mild nasal turbinate swelling. No oral ulcers. Cardiovascular:  Regular rate. Trace edema unchanged. Normal S1 & S2.  Pulmonary:  Continue clarity with auscultation. Cough seems to be triggered by deep inspiration. Normal work of breathing on room air. Abdomen: Soft. Normal bowel sounds. Nontender. Protuberant.  PFT 12/27/15: FVC 4.18 L (81%) FEV1 3.30 L (83%) FEV1/FVC 0.79 FEF 25-75 3.4 L (100%) negative bronchodilator response TLC 6.16 L (92%) RV 110% ERV 19% DLCO uncorrected 107%  IMAGING BARIUM SWALLOW/ESOPHAGRAM 11/28/15 (per radiologist): Small sliding-type hiatal hernia with prominent spontaneous gastroesophageal reflux to the thoracic inlet. Barium tablet passed easily without delay.  CT CHEST W/O (personally reviewed by me): 4 mm nodule noted within right middle lobe. No other opacity appreciated. No pathologic mediastinal adenopathy. No pleural effusion or thickening. No pericardial effusion.  CXR PA/LAT 09/26/15 (previously reviewed by me): Low lung volumes. No parenchymal nodule or opacity appreciated. No pleural effusion. Heart normal in size & mediastinum normal in contour.  CARDIAC TTE (11/28/15): LV normal in size with focal basal septal hypertrophy that is mild. Grade 1 diastolic dysfunction with EF 55-60% and no regional wall motion abnormalities. LA & RA normal in size. RV normal in size and function. No aortic stenosis or regurgitation. Aortic root normal in size. No mitral stenosis or regurgitation. No pulmonic stenosis. No tricuspid regurgitation. No pericardial effusion.  LABS 11/16/15 CBC: 9.7/16.3/47.8/291 BMP: 136/4.4/101/26/17/1.07/234/9.1 LFT: 3.8/7.1/0.5/71/13/19 ProBNP: 13.0 CRP: 0.4 ESR: 26 IgE: 116 RAST Panel:  03-17-1971 Grass 4.12 / Common Ragweed 6.86 / Dog Dander 6.35 / Multiple other  positives ANA:  Negative Anti-CCP:  <16 RF:  <14    Assessment & Plan:  55 y.o. male with long-standing and chronic history of cough likely secondary to his reflux. Certainly he has chronic allergic rhinitis could be contributing with multiple environmental allergens identified with serum testing. I am worried that he is continuing to have reflux contributing to his cough. We did discuss his test results today including the right middle lobe nodule which patient wishes to defer repeat testing due to cost at this time. I have a low suspicion for underlying asthma but cannot completely rule this out without methacholine challenge testing despite normal spirometry. I instructed the patient to contact my office if he did not have complete resolution of his cough and approximately 4-5 weeks.  1. Cough:  Doubt asthma. Increasing treatment for allergic rhinitis & reflux. Deferring referral to ENT and/or GI as well as methacholine challenge testing for now. 2. GERD: Increasing Prilosec to  40 mg by mouth twice a day & Zantac to 150 mg by mouth twice a day. Continuing dietary and lifestyle modifications. 3. Chronic Allergic Rhinitis: Continuing Zyrtec/Claritin daily. Recommended nasal saline rinse twice daily. Starting Singulair 10 mg by mouth daily at bedtime. 4. Right Middle Lobe Nodule: Patient concerned regarding cost of repeat CT imaging ($500). Deferring repeat CT imaging at this time but would favor April 2018 or October 2018. 5. OSA: Previously nonadherent to CPAP therapy. Deferring further discussions next appointment. 6. Health Maintenance:  Administering influenza vaccine today. 7. Follow-up: Patient to return to clinic in 3 months or sooner if needed.  Sonia Baller Ashok Cordia, M.D. [facility] Pulmonary & Critical Care Pager:  [phone] After 3pm or if no response, call [phone] 4:00 PM 12/27/15

## 2015-12-30 ENCOUNTER — Telehealth: Payer: Self-pay | Admitting: Pulmonary Disease

## 2015-12-30 MED ORDER — PANTOPRAZOLE SODIUM 40 MG PO TBEC: 40.0000 mg | DELAYED_RELEASE_TABLET | Freq: Two times a day (BID) | ORAL | 3 refills | Status: DC

## 2015-12-30 NOTE — Addendum Note (Signed)
Addended by: Christen ButterASKIN, Camrynn Mcclintic M on: 12/30/2015 05:18 PM   Modules accepted: Orders

## 2015-12-30 NOTE — Telephone Encounter (Signed)
Spoke with the pt and notified of recs per JN  He verbalized understanding  Rx was sent to pharm and MAR updated

## 2015-12-30 NOTE — Telephone Encounter (Signed)
Please call the patient and let him know that you are sending in a prescription for Protonix. Please send in a prescription for Protonix 40mg  po bid - #[phone] with 3 refills. This will replace his Prilosec. He will continue taking Zantac twice daily as planned as well as his other medications. Thanks.

## 2016-02-03 ENCOUNTER — Telehealth: Payer: Self-pay | Admitting: Pulmonary Disease

## 2016-02-03 DIAGNOSIS — J309 Allergic rhinitis, unspecified: Secondary | ICD-10-CM

## 2016-02-03 DIAGNOSIS — R05 Cough: Secondary | ICD-10-CM

## 2016-02-03 DIAGNOSIS — R059 Cough, unspecified: Secondary | ICD-10-CM

## 2016-02-03 NOTE — Telephone Encounter (Signed)
Order has been placed for the pt to get appt with ENT.

## 2016-02-03 NOTE — Telephone Encounter (Signed)
Whomever is available first. Thanks.

## 2016-02-03 NOTE — Telephone Encounter (Signed)
Called and spoke with pt and he stated that he is maybe 30 to 40% better.  He stated that he is still coughing and wanted to call back to let JN know that this was still going on. Would like the order for the ENT referral to be placed.  JN please advise which ENT you prefer.  Thanks  No Known Allergies

## 2016-02-07 DIAGNOSIS — K219 Gastro-esophageal reflux disease without esophagitis: Secondary | ICD-10-CM | POA: Insufficient documentation

## 2016-02-14 ENCOUNTER — Other Ambulatory Visit: Payer: Self-pay | Admitting: Internal Medicine

## 2016-02-14 DIAGNOSIS — R609 Edema, unspecified: Secondary | ICD-10-CM

## 2016-02-14 DIAGNOSIS — M7989 Other specified soft tissue disorders: Secondary | ICD-10-CM | POA: Diagnosis not present

## 2016-02-14 DIAGNOSIS — J069 Acute upper respiratory infection, unspecified: Secondary | ICD-10-CM | POA: Diagnosis not present

## 2016-02-14 DIAGNOSIS — M79661 Pain in right lower leg: Secondary | ICD-10-CM

## 2016-02-14 DIAGNOSIS — R21 Rash and other nonspecific skin eruption: Secondary | ICD-10-CM | POA: Diagnosis not present

## 2016-02-15 ENCOUNTER — Ambulatory Visit
Admission: RE | Admit: 2016-02-15 | Discharge: 2016-02-15 | Disposition: A | Discharge status: Home / Self Care | Payer: BLUE CROSS/BLUE SHIELD | Source: Ambulatory Visit | Attending: Internal Medicine | Admitting: Internal Medicine

## 2016-02-15 DIAGNOSIS — R609 Edema, unspecified: Secondary | ICD-10-CM

## 2016-02-15 DIAGNOSIS — M79661 Pain in right lower leg: Secondary | ICD-10-CM

## 2016-02-17 DIAGNOSIS — N529 Male erectile dysfunction, unspecified: Secondary | ICD-10-CM | POA: Diagnosis not present

## 2016-02-17 DIAGNOSIS — R21 Rash and other nonspecific skin eruption: Secondary | ICD-10-CM | POA: Diagnosis not present

## 2016-02-17 DIAGNOSIS — E1140 Type 2 diabetes mellitus with diabetic neuropathy, unspecified: Secondary | ICD-10-CM | POA: Diagnosis not present

## 2016-02-17 DIAGNOSIS — R609 Edema, unspecified: Secondary | ICD-10-CM | POA: Diagnosis not present

## 2016-03-08 DIAGNOSIS — L97509 Non-pressure chronic ulcer of other part of unspecified foot with unspecified severity: Secondary | ICD-10-CM | POA: Diagnosis not present

## 2016-03-08 DIAGNOSIS — M205X1 Other deformities of toe(s) (acquired), right foot: Secondary | ICD-10-CM | POA: Diagnosis not present

## 2016-03-08 DIAGNOSIS — E11621 Type 2 diabetes mellitus with foot ulcer: Secondary | ICD-10-CM | POA: Diagnosis not present

## 2016-03-08 DIAGNOSIS — L03031 Cellulitis of right toe: Secondary | ICD-10-CM | POA: Diagnosis not present

## 2016-03-16 DIAGNOSIS — L97509 Non-pressure chronic ulcer of other part of unspecified foot with unspecified severity: Secondary | ICD-10-CM | POA: Diagnosis not present

## 2016-03-16 DIAGNOSIS — E11621 Type 2 diabetes mellitus with foot ulcer: Secondary | ICD-10-CM | POA: Diagnosis not present

## 2016-03-16 DIAGNOSIS — M205X1 Other deformities of toe(s) (acquired), right foot: Secondary | ICD-10-CM | POA: Diagnosis not present

## 2016-04-04 ENCOUNTER — Encounter: Payer: Self-pay | Admitting: Pulmonary Disease

## 2016-04-04 ENCOUNTER — Ambulatory Visit (INDEPENDENT_AMBULATORY_CARE_PROVIDER_SITE_OTHER): Payer: BLUE CROSS/BLUE SHIELD | Admitting: Pulmonary Disease

## 2016-04-04 VITALS — BP 134/92 | HR 94 | Ht 71.0 in | Wt 285.8 lb

## 2016-04-04 DIAGNOSIS — K219 Gastro-esophageal reflux disease without esophagitis: Secondary | ICD-10-CM

## 2016-04-04 DIAGNOSIS — R05 Cough: Secondary | ICD-10-CM

## 2016-04-04 DIAGNOSIS — R911 Solitary pulmonary nodule: Secondary | ICD-10-CM | POA: Diagnosis not present

## 2016-04-04 DIAGNOSIS — J309 Allergic rhinitis, unspecified: Secondary | ICD-10-CM

## 2016-04-04 DIAGNOSIS — IMO0001 Reserved for inherently not codable concepts without codable children: Secondary | ICD-10-CM

## 2016-04-04 DIAGNOSIS — G4733 Obstructive sleep apnea (adult) (pediatric): Secondary | ICD-10-CM

## 2016-04-04 DIAGNOSIS — R059 Cough, unspecified: Secondary | ICD-10-CM

## 2016-04-04 NOTE — Progress Notes (Signed)
Subjective:    Patient ID: [name], male    DOB: 28-Oct-1960, 56 y.o.   MRN: [id number]  C.C.:  Follow-up for Cough, Edema, Chronic Allergic Rhinitis, GERD, OSA, & Right Middle Lobe Nodule.  HPI Cough: Likely secondary to uncontrolled underlying reflux. Deferred referral to ENT at last appointment but reports he since was seen and had a laryngoscopy without abnormality. Reports he hasn't been routinely taking Breo but it doesn't seem to help. He reports about 3 weeks ago his cough had improved but has recurred in the last couple of days. Cough remains nonproductive. Reports cough is worse when he is talking. He denies any recent nocturnal awakenings with coughing.   Chronic Allergic Rhinitis: Multiple environmental allergens. Previously instructed to take Zyrtec. Recommended twice daily nasal saline rinse at last appointment. Started Singulair daily at bedtime at last appointment. He denies any sinus congestion, pressure, or post-nasal drainage.   GERD: Small sliding-type hiatal hernia noted on esophagram. Prescribed Protonix twice a day & Zantac. He denies any reflux or dyspepsia. Did have frequent eructation previously that has subsided. Denies any morning brash water taste.   OSA:  S/P UPPP surgery. Previously nonadherent to CPAP.  Right Middle Lobe Nodule: 4 mm and initially seen on CT imaging in October 2017. Patient previously concerned regarding cost of repeat image testing.  Review of Systems No recent fever or chills. No rashes or bruising. No new joint pain or erythema.   No Known Allergies  Current Outpatient Prescriptions on File Prior to Visit  Medication Sig Dispense Refill  . aspirin EC 81 MG tablet Take 81 mg by mouth daily.    . Cetirizine HCl (ZYRTEC ALLERGY PO) Take 10 mg by mouth daily.    . fluticasone furoate-vilanterol (BREO ELLIPTA) 200-25 MCG/INH AEPB Inhale 1 puff into the lungs daily.    Marland Kitchen glimepiride (AMARYL) 4 MG tablet Take 4 mg by mouth daily with  breakfast.     . metFORMIN (GLUCOPHAGE-XR) 500 MG 24 hr tablet Take 1,000 mg by mouth 2 (two) times daily.     . montelukast (SINGULAIR) 10 MG tablet Take 1 tablet (10 mg total) by mouth at bedtime. 30 tablet 3  . pantoprazole (PROTONIX) 40 MG tablet Take 1 tablet (40 mg total) by mouth 2 (two) times daily. 60 tablet 3  . ranitidine (ZANTAC) 150 MG tablet Take 1 tablet (150 mg total) by mouth 2 (two) times daily. 60 tablet 3  . traMADol (ULTRAM) 50 MG tablet Take 0.5-1 tablets (25-50 mg total) by mouth every 6 (six) hours as needed. 25 tablet 0   No current facility-administered medications on file prior to visit.     Past Medical History:  Diagnosis Date  . Diabetes ([place])   . Hyperlipidemia   . Hypertension   . NSTEMI (non-ST elevated myocardial infarction) ([place])   . Obesity     Past Surgical History:  Procedure Laterality Date  . ACHILLES TENDON REPAIR Right   . heart stent  2012   x1  . SHOULDER ARTHROSCOPY Right   . UVULOPALATOPHARYNGOPLASTY      Family History  Problem Relation Age of Onset  . Cancer Other   . Diabetes Other   . Heart disease Other   . Breast cancer Mother   . Heart disease Father   . Diabetes Father   . Leukemia Brother   . Asthma Maternal Uncle     Social History   Social History  . Marital status: Married  Spouse name: N/A  . Number of children: N/A  . Years of education: N/A   Social History Main Topics  . Smoking status: Passive Smoke Exposure - Never Smoker  . Smokeless tobacco: Never Used     Comment: Both parents growing up.   . Alcohol use Yes     Comment: 4-5 on weekends  . Drug use: No  . Sexual activity: Not Asked   Other Topics Concern  . None   Social History Narrative   Originally from [place], [place]. Previously has lived in [place]. 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 No pets currently. No bird or hot tub exposure. Could have mold in his current home and has lived  there for 1.5 years now.       Objective:   Physical Exam BP (!) 134/92 (BP Location: Right Arm, Patient Position: Sitting, Cuff Size: Normal)   Pulse 94   Ht 5' 11"  (1.803 m)   Wt 285 lb 12.8 oz (129.6 kg)   SpO2 96%   BMI 39.86 kg/m   Gen.: Obese male. No distress. Appears comfortable. HEENT: Moist mucous membranes. No nasal turbinate swelling. No scleral icterus. Integument: Warm and dry. No rash on exposed skin. Cardiovascular: Regular rate. Regular rhythm. Normal S1 & S2. Pulmonary: Clear with auscultation. No accessory muscle use. Patient did have infrequent, nonproductive cough. Abdomen: Soft. Protuberant. Normal bowel sounds. Extremities: No cyanosis or clubbing.  PFT 12/27/15: FVC 4.18 L (81%) FEV1 3.30 L (83%) FEV1/FVC 0.79 FEF 25-75 3.4 L (100%) negative bronchodilator response TLC 6.16 L (92%) RV 110% ERV 19% DLCO uncorrected 107%  IMAGING BARIUM SWALLOW/ESOPHAGRAM 11/28/15 (per radiologist): Small sliding-type hiatal hernia with prominent spontaneous gastroesophageal reflux to the thoracic inlet. Barium tablet passed easily without delay.  CT CHEST W/O 11/28/15 (personally reviewed by me): 4 mm nodule noted within right middle lobe. No other opacity appreciated. No/ /pathologic mediastinal adenopathy. No pleural effusion or thickening. No pericardial effusion.  CXR PA/LAT 09/26/15 (previously reviewed by me): Low lung volumes. No parenchymal nodule or opacity appreciated. No pleural effusion. Heart normal in size & mediastinum normal in contour.  CARDIAC TTE (11/28/15): LV normal in size with focal basal septal hypertrophy that is mild. Grade 1 diastolic dysfunction with EF 55-60% and no regional wall motion abnormalities. LA & RA normal in size. RV normal in size and function. No aortic stenosis or regurgitation. Aortic root normal in size. No mitral stenosis or regurgitation. No pulmonic stenosis. No tricuspid regurgitation. No pericardial  effusion.  LABS 11/16/15 CBC: 9.7/16.3/47.8/291 BMP: 136/4.4/101/26/17/1.07/234/9.1 LFT: 3.8/7.1/0.5/71/13/19 ProBNP: 13.0 CRP: 0.4 ESR: 26 IgE: 116 RAST Panel:  [place] Grass 4.12 / Common Ragweed 6.86 / Dog Dander 6.35 / Multiple other positives ANA:  Negative Anti-CCP:  <16 RF:  <14    Assessment & Plan:  56 y.o. male with ongoing and intermittent cough. Cough is still of unclear etiology. Does not seem to be responding to treatment of his allergic rhinitis or reflux in with report of normal direct laryngoscopy from the patient I do believe that this could be due to neurogenic cough. The patient's reflux and allergic rhinitis seems to be well-controlled. We did briefly discuss trying the patient on gabapentin given his previous lack of response while being treated with tramadol for pain. We are holding off on adding additional medications to his regimen pending worsening of his cough to ensure that the cough is truly responding to treatment rather than  having his usual intermittent fluctuation.  1. Cough: Question possible neurogenic cough. Holding off on treatment with gabapentin for now. Patient to notify me for escalating cough before trying gabapentin. 2. GERD: Continuing Protonix and Zantac. No changes. 3. Chronic allergic rhinitis:  Continuing Singulair. Recommended starting Claritin/Zyrtec/Allegra with the seasonal change. 4. Right middle lobe nodule:  Holding off on imaging for now. 5. OSA:  S/P UPPP surgery.  6. Health maintenance: Status post influenza vaccine November 2017. 7. Follow-up: Return to clinic in 3 months or sooner if needed.   Sonia Baller Ashok Cordia, M.D. [facility] Pulmonary & Critical Care Pager:  [phone] After 3pm or if no response, call [phone] 5:07 PM 04/04/16

## 2016-04-04 NOTE — Patient Instructions (Signed)
   Call me if your cough gets worse.  The next step would be to try you on Gabapentin (brand is Neurontin).  When the pollen starts to increase start taking some Zyrtec, Claritin, or Allegra.  I will see you back in 3 months or sooner if needed.

## 2016-04-24 ENCOUNTER — Other Ambulatory Visit: Payer: Self-pay | Admitting: Pulmonary Disease

## 2016-04-25 DIAGNOSIS — E1140 Type 2 diabetes mellitus with diabetic neuropathy, unspecified: Secondary | ICD-10-CM | POA: Diagnosis not present

## 2016-04-25 DIAGNOSIS — M7989 Other specified soft tissue disorders: Secondary | ICD-10-CM | POA: Diagnosis not present

## 2016-04-25 DIAGNOSIS — N529 Male erectile dysfunction, unspecified: Secondary | ICD-10-CM | POA: Diagnosis not present

## 2016-04-25 DIAGNOSIS — R609 Edema, unspecified: Secondary | ICD-10-CM | POA: Diagnosis not present

## 2016-05-01 ENCOUNTER — Other Ambulatory Visit: Payer: Self-pay | Admitting: Pulmonary Disease

## 2016-05-01 ENCOUNTER — Other Ambulatory Visit: Payer: Self-pay | Admitting: Emergency Medicine

## 2016-05-24 DIAGNOSIS — E119 Type 2 diabetes mellitus without complications: Secondary | ICD-10-CM | POA: Diagnosis not present

## 2016-05-30 ENCOUNTER — Encounter: Payer: BLUE CROSS/BLUE SHIELD | Attending: Internal Medicine | Admitting: *Deleted

## 2016-05-30 DIAGNOSIS — E119 Type 2 diabetes mellitus without complications: Secondary | ICD-10-CM | POA: Diagnosis not present

## 2016-05-30 DIAGNOSIS — Z713 Dietary counseling and surveillance: Secondary | ICD-10-CM | POA: Insufficient documentation

## 2016-05-30 NOTE — Patient Instructions (Signed)
Plan:  Aim for 3-4 Carb Choices per meal (45-60 grams)   Aim for 0-2 Carbs per snack if hungry  Include protein in moderation with your meals and snacks Consider reading food labels for Total Carbohydrate of foods Consider  increasing your activity level by Arm Chair exercises for now and expand as tolerated Consider checking BG at alternate times per day occasionally Consider taking medication as directed by MD We have discussed how to give your insulin, how to store it and how to rotate your sites. Call me if you have any questions or concerns?

## 2016-05-30 NOTE — Progress Notes (Signed)
Diabetes Self-Management Education  Visit Type:  Follow-up  Appt. Start Time: 0730 Appt. End Time: 0900  05/30/2016  [name], identified by name and date of birth, is a 56 y.o. male with a diagnosis of Diabetes:. He has had Diabetes for over 5 years. His control has been somewhat erratic when he has lost a job and insurance in the past. 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 He states he has insurance now, but it is not very complete. He is able to pay for his insulin and he has bought a Tax adviser so he can get the strips for less money. He started on insulin last week but has questions on how to inject it properly. He also has questions about how to eat better.     ASSESSMENT  Height  (1.803 m), weight 288 lb 12.8 oz (131 kg). Body mass index is 40.28 kg/m.       Diabetes Self-Management Education - 05/30/16 0753      Health Coping   How would you rate your overall health? (P)  Fair     Psychosocial Assessment   Patient Belief/Attitude about Diabetes (P)  Motivated to manage diabetes   Patient Concerns (P)  Nutrition/Meal planning;Medication;Glycemic Control     Pre-Education Assessment   Patient understands the diabetes disease and treatment process. (P)  Needs Review   Patient understands incorporating nutritional management into lifestyle. (P)  Needs Instruction   Patient undertands incorporating physical activity into lifestyle. (P)  Needs Review   Patient understands using medications safely. (P)  Needs Instruction   Patient understands monitoring blood glucose, interpreting and using results (P)  Needs Review   Patient understands prevention, detection, and treatment of acute complications. (P)  Needs Instruction   Patient understands prevention, detection, and treatment of chronic complications. (P)  Needs Review     Complications   Last HgB A1C per patient/outside source (P)  --  over 10% per patient   How often do you check  your blood sugar? (P)  1-2 times/day   Fasting Blood glucose range (mg/dL) (P)  161-096;045-409   Number of hypoglycemic episodes per month (P)  --  0   Have you had a dilated eye exam in the past 12 months? (P)  Yes   Have you had a dental exam in the past 12 months? (P)  Yes   Are you checking your feet? (P)  Yes   How many days per week are you checking your feet? (P)  --  7     Dietary Intake   Breakfast nutribullet smoothie with 1-2 strawberries, blueberry, carrots, spinach, super food powder, kia seeds, flax seeds, almonds   Snack (morning) Smart Bar (protein bar)   Lunch fast food: salad OR burger and fries OR 2 pieces fried chicken and pintos OR [employer] with meat and 2 vegetables   Dinner bologna and cheese sandwich, chips    Snack (evening) more chips   Beverage(s) unsweet tea, diet soda, crystal light lemonade, water throughout the day,      Exercise   Exercise Type (P)  ADL's  has sore on bottom of hammer toe, trying not walking on it     Patient Education   Previous Diabetes Education (P)  Yes (please comment)  1.5 years ago     Individualized Goals (developed by patient)   Nutrition (P)  Follow meal plan discussed   Physical Activity (P)  Exercise 3-5 times per  week   Medications (P)  take my medication as prescribed   Monitoring  (P)  test blood glucose pre and post meals as discussed     Outcomes   Program Status (P)  Completed     Subsequent Visit   Since your last visit have you continued or begun to take your medications as prescribed? (P)  Yes   Since your last visit, are you checking your blood glucose at least once a day? (P)  Yes      Learning Objective:  Patient will have a greater understanding of diabetes self-management. Patient education plan is to attend individual and/or group sessions per assessed needs and concerns.   Plan:   Patient Instructions  Plan:  Aim for 3-4 Carb Choices per meal (45-60 grams)   Aim for 0-2 Carbs per snack if hungry   Include protein in moderation with your meals and snacks Consider reading food labels for Total Carbohydrate of foods Consider  increasing your activity level by Arm Chair exercises for now and expand as tolerated Consider checking BG at alternate times per day occasionally Consider taking medication as directed by MD We have discussed how to give your insulin, how to store it and how to rotate your sites. Call me if you have any questions or concerns?  Expected Outcomes:  (P) Demonstrated interest in learning. Expect positive outcomes  Education material provided: Living Well with Diabetes, Meal plan card, Snack sheet and Carbohydrate counting sheet, Diabetes medication sheet, insulin handout, Arm Chair exercise handout, insulin instruction handout  If problems or questions, patient to contact team via:  Phone  Future DSME appointment: - (P) PRN

## 2016-06-21 DIAGNOSIS — L03115 Cellulitis of right lower limb: Secondary | ICD-10-CM | POA: Diagnosis not present

## 2016-06-21 DIAGNOSIS — N529 Male erectile dysfunction, unspecified: Secondary | ICD-10-CM | POA: Diagnosis not present

## 2016-06-21 DIAGNOSIS — E119 Type 2 diabetes mellitus without complications: Secondary | ICD-10-CM | POA: Diagnosis not present

## 2016-06-29 ENCOUNTER — Ambulatory Visit: Payer: BLUE CROSS/BLUE SHIELD | Admitting: Pulmonary Disease

## 2016-08-29 ENCOUNTER — Ambulatory Visit (INDEPENDENT_AMBULATORY_CARE_PROVIDER_SITE_OTHER): Payer: BLUE CROSS/BLUE SHIELD | Admitting: Pulmonary Disease

## 2016-08-29 VITALS — BP 142/80 | HR 93 | Ht 71.0 in | Wt 304.4 lb

## 2016-08-29 DIAGNOSIS — J309 Allergic rhinitis, unspecified: Secondary | ICD-10-CM

## 2016-08-29 DIAGNOSIS — R05 Cough: Secondary | ICD-10-CM

## 2016-08-29 DIAGNOSIS — K219 Gastro-esophageal reflux disease without esophagitis: Secondary | ICD-10-CM | POA: Diagnosis not present

## 2016-08-29 DIAGNOSIS — R059 Cough, unspecified: Secondary | ICD-10-CM

## 2016-08-29 MED ORDER — RANITIDINE HCL 150 MG PO TABS: 150.0000 mg | ORAL_TABLET | Freq: Two times a day (BID) | ORAL | 3 refills | Status: DC

## 2016-08-29 MED ORDER — AEROCHAMBER MV MISC: 0 refills | Status: DC

## 2016-08-29 MED ORDER — BUDESONIDE-FORMOTEROL FUMARATE 160-4.5 MCG/ACT IN AERO: 2.0000 | INHALATION_SPRAY | Freq: Two times a day (BID) | RESPIRATORY_TRACT | 0 refills | Status: DC

## 2016-08-29 NOTE — Progress Notes (Signed)
Subjective:    Patient ID: [name], male    DOB: 08-Aug-1960, 56 y.o.   MRN: [id number]  C.C.:  Follow-up for Cough, Edema, Chronic Allergic Rhinitis, GERD, OSA, & Right Middle Lobe Nodule.  HPI Cough: Likely multifactorial in etiology. Previously evaluated by ENT without reported abnormality. Suspect some element of reflux as well as possible neurogenic cough. He reports his cough had completely stopped around January but recently he has noticed a mild, intermittent cough that is nonproductive. No specific triggers. Cough currently isn't waking him up at night. Previously tried on [employer] & Ultr[employer] without relief.   Chronic allergic rhinitis: Serum testing shows multiple environmental allergens. Previously instructed to take Zyrtec & started on Singulair daily at bedtime. Also recommended to try nasal saline rinse daily. He reports he is alternating between Allegra, Claritin, & Zyrtec. Currently he is on Zyrtec.   GERD: Found have small sliding-type vital hernia on esophagram. On treatment with Protonix twice a day & Zantac twice a day. No reflux, dyspepsia, or morning brash water taste.   OSA:  S/P UPPP surgery. Previously nonadherent to CPAP.  Right middle lobe nodule: 4 mm and initially seen on CT imaging in October 2017. Patient previously concerned regarding cost of repeat image testing. Patient declined further imaging.  Review of Systems No wheezing or dyspnea. No chest pain, tightness, or pressure. No fever or chills.   No Known Allergies  Current Outpatient Prescriptions on File Prior to Visit  Medication Sig Dispense Refill  . aspirin EC 81 MG tablet Take 81 mg by mouth daily.    . Cetirizine HCl (ZYRTEC ALLERGY PO) Take 10 mg by mouth daily.    Marland Kitchen glimepiride (AMARYL) 4 MG tablet Take 4 mg by mouth daily with breakfast.     . metFORMIN (GLUCOPHAGE-XR) 500 MG 24 hr tablet Take 1,000 mg by mouth 2 (two) times daily.     . montelukast (SINGULAIR) 10 MG tablet TAKE ONE TABLET  BY MOUTH AT BEDTIME 30 tablet 3  . pantoprazole (PROTONIX) 40 MG tablet TAKE ONE TABLET BY MOUTH TWICE DAILY 60 tablet 3  . ranitidine (ZANTAC) 150 MG tablet Take 1 tablet (150 mg total) by mouth 2 (two) times daily. 60 tablet 3  . traMADol (ULTRAM) 50 MG tablet Take 0.5-1 tablets (25-50 mg total) by mouth every 6 (six) hours as needed. 25 tablet 0  . fluticasone furoate-vilanterol (BREO ELLIPTA) 200-25 MCG/INH AEPB Inhale 1 puff into the lungs daily.     No current facility-administered medications on file prior to visit.     Past Medical History:  Diagnosis Date  . Diabetes ([place])   . Hyperlipidemia   . Hypertension   . NSTEMI (non-ST elevated myocardial infarction) ([place])   . Obesity     Past Surgical History:  Procedure Laterality Date  . ACHILLES TENDON REPAIR Right   . heart stent  2012   x1  . SHOULDER ARTHROSCOPY Right   . UVULOPALATOPHARYNGOPLASTY      Family History  Problem Relation Age of Onset  . Cancer Other   . Diabetes Other   . Heart disease Other   . Breast cancer Mother   . Heart disease Father   . Diabetes Father   . Leukemia Brother   . Asthma Maternal Uncle     Social History   Social History  . Marital status: Married    Spouse name: N/A  . Number of children: N/A  . Years of education: N/A   Social  History Main Topics  . Smoking status: Passive Smoke Exposure - Never Smoker  . Smokeless tobacco: Never Used     Comment: Both parents growing up.   . Alcohol use Yes     Comment: 4-5 on weekends  . Drug use: No  . Sexual activity: Not on file   Other Topics Concern  . Not on file   Social History Narrative   Originally from [place], [place]. Previously has lived in [place]. 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 No pets currently. No bird or hot tub exposure. Could have mold in his current home and has lived there for 1.5 years now.       Objective:   Physical Exam BP (!) 142/80 (BP  Location: Left Arm, Patient Position: Sitting, Cuff Size: Large)   Pulse 93   Ht _0  (1.803 m)   Wt (!) 304 lb 6.4 oz (138.1 kg)   SpO2 94%   BMI 42.46 kg/m   General:  Awake. Alert. Increasing obesity. No distress. Integument:  Warm & dry. No rash on exposed skin. No bruising on exposed skin. Extremities:  No cyanosis or clubbing.  HEENT:  Moist mucus membranes. Minimal nasal turbinate swelling. No scleral icterus. Cardiovascular:  Regular rate and rhythm. Pitting edema bilateral lower extremities up to the knees.  Pulmonary:  Slightly diminished breath sounds at bases but otherwise clear to auscultation. Normal work of breathing on room air. Abdomen: Soft. Normal bowel sounds. Protuberant. Musculoskeletal:  Normal bulk and tone. No joint deformity or effusion appreciated.  PFT 12/27/15: FVC 4.18 L (81%) FEV1 3.30 L (83%) FEV1/FVC 0.79 FEF 25-75 3.4 L (100%) negative bronchodilator response TLC 6.16 L (92%) RV 110% ERV 19% DLCO uncorrected 107%  IMAGING BARIUM SWALLOW/ESOPHAGRAM 11/28/15 (per radiologist): Small sliding-type hiatal hernia with prominent spontaneous gastroesophageal reflux to the thoracic inlet. Barium tablet passed easily without delay.  CT CHEST W/O 11/28/15 (previously reviewed by me): 4 mm nodule noted within right middle lobe. No other opacity appreciated. No/ /pathologic mediastinal adenopathy. No pleural effusion or thickening. No pericardial effusion.  CXR PA/LAT 09/26/15 (previously reviewed by me): Low lung volumes. No parenchymal nodule or opacity appreciated. No pleural effusion. Heart normal in size & mediastinum normal in contour.  CARDIAC TTE (11/28/15): LV normal in size with focal basal septal hypertrophy that is mild. Grade 1 diastolic dysfunction with EF 55-60% and no regional wall motion abnormalities. LA & RA normal in size. RV normal in size and function. No aortic stenosis or regurgitation. Aortic root normal in size. No mitral stenosis or  regurgitation. No pulmonic stenosis. No tricuspid regurgitation. No pericardial effusion.  LABS 11/16/15 CBC: 9.7/16.3/47.8/291 BMP: 136/4.4/101/26/17/1.07/234/9.1 LFT: 3.8/7.1/0.5/71/13/19 ProBNP: 13.0 CRP: 0.4 ESR: 26 IgE: 116 RAST Panel:  [place] Grass 4.12 / Common Ragweed 6.86 / Dog Dander 6.35 / Multiple other positives ANA:  Negative Anti-CCP:  <16 RF:  <14    Assessment & Plan:  56 y.o. male with intermittent cough highly suspicious for cough variant asthma. Given his previous resolution I do not believe the patient has a neurogenic cough. His allergies and reflux seem to be well-controlled at this time. However, with his previous allergen panel results I believe he is entering a period of reactivity with some of the grasses and plans as noted above. I instructed the patient to contact my office if he had any new breathing problems or questions before your next appointment.  1. Cough:  Highly suspicious for cough variant asthma. Trying patient on Symbicort 160/4.5 with spacer for 4 weeks. Patient to notify me for a prescription or any change in symptoms. 2. GERD: Controlled with Protonix & Zantac. No changes. 3. Chronic allergic rhinitis: Continuing patient on Singulair & antihistamine therapy. No changes. 4. Health maintenance: Status post influenza vaccine November 2017. 5. Follow-up: Return to clinic in 3 months or sooner if needed.  Sonia Baller Ashok Cordia, M.D. [facility] Pulmonary & Critical Care Pager:  [phone] After 3pm or if no response, call [phone] 5:08 PM 08/29/16

## 2016-08-29 NOTE — Progress Notes (Signed)
Patient seen in the office today and instructed on use of Symbicort 160 and spacer.  Patient expressed understanding and demonstrated technique. 

## 2016-08-29 NOTE — Patient Instructions (Signed)
   Continue using your medications as prescribed.  Use the Symbicort samples we are giving you today faithfully for 4 weeks to see if they help your cough.  Inhale 2 puffs of the Symbicort twice daily with the spacer we are giving you today.  Remember to remove any dentures or partials you have before you use your inhaler. Remember to brush your teeth & tongue after you use your inhaler as well as rinse, gargle & spit to keep from getting thrush in your mouth or on your tongue (a white film).   Call or message me if your cough gets worse or you need a prescription for the Symbicort because it is working.  I will see you back in 3months or sooner if needed.

## 2016-09-01 ENCOUNTER — Other Ambulatory Visit: Payer: Self-pay | Admitting: Pulmonary Disease

## 2016-09-06 ENCOUNTER — Encounter: Payer: Self-pay | Admitting: Pulmonary Disease

## 2016-09-07 MED ORDER — RANITIDINE HCL 150 MG PO TABS: 150.0000 mg | ORAL_TABLET | Freq: Two times a day (BID) | ORAL | 3 refills | Status: DC

## 2016-09-07 NOTE — Telephone Encounter (Signed)
Medication Detail    Disp Refills Start End   ranitidine (ZANTAC) 150 MG tablet 60 tablet 3 08/29/2016    Sig - Route: Take 1 tablet (150 mg total) by mouth 2 (two) times daily. - Oral   Sent to pharmacy as: ranitidine (ZANTAC) 150 MG tablet   E-Prescribing Status: Receipt confirmed by pharmacy (08/29/2016 5:48 PM EDT)    Per our system, the Rx was indeed sent on 7.18.18 for #60 with 3 additional refills Called [facility] and spoke with pharmacist Sam who reported they did not receive the Rx Rx given verbally to Sam for Zantac 150mg  1tab by mouth twice daily #60 w/ 3 refills E-mail sent to patient explaining the above and apologizing for any inconvenience Nothing further needed; will sign off

## 2016-10-01 DIAGNOSIS — E119 Type 2 diabetes mellitus without complications: Secondary | ICD-10-CM | POA: Diagnosis not present

## 2016-10-01 DIAGNOSIS — G4730 Sleep apnea, unspecified: Secondary | ICD-10-CM | POA: Diagnosis not present

## 2016-10-01 DIAGNOSIS — N529 Male erectile dysfunction, unspecified: Secondary | ICD-10-CM | POA: Diagnosis not present

## 2016-12-04 ENCOUNTER — Ambulatory Visit: Payer: BLUE CROSS/BLUE SHIELD | Admitting: Pulmonary Disease

## 2016-12-04 DIAGNOSIS — Z23 Encounter for immunization: Secondary | ICD-10-CM | POA: Diagnosis not present

## 2016-12-11 ENCOUNTER — Encounter: Payer: Self-pay | Admitting: Pulmonary Disease

## 2016-12-11 ENCOUNTER — Ambulatory Visit (INDEPENDENT_AMBULATORY_CARE_PROVIDER_SITE_OTHER): Payer: BLUE CROSS/BLUE SHIELD | Admitting: Pulmonary Disease

## 2016-12-11 VITALS — BP 134/72 | HR 89 | Ht 71.0 in | Wt 314.2 lb

## 2016-12-11 DIAGNOSIS — K219 Gastro-esophageal reflux disease without esophagitis: Secondary | ICD-10-CM | POA: Diagnosis not present

## 2016-12-11 DIAGNOSIS — R05 Cough: Secondary | ICD-10-CM

## 2016-12-11 DIAGNOSIS — Z9989 Dependence on other enabling machines and devices: Secondary | ICD-10-CM

## 2016-12-11 DIAGNOSIS — R059 Cough, unspecified: Secondary | ICD-10-CM

## 2016-12-11 DIAGNOSIS — G4733 Obstructive sleep apnea (adult) (pediatric): Secondary | ICD-10-CM

## 2016-12-11 DIAGNOSIS — J309 Allergic rhinitis, unspecified: Secondary | ICD-10-CM

## 2016-12-11 NOTE — Progress Notes (Signed)
Subjective:    Patient ID: [name], male    DOB: 1960-04-21, 56 y.o.   MRN: [id number]  C.C.:  Follow-up for Cough Variant Asthma, Chronic Allergic Rhinitis, GERD, OSA, & Right Middle Lobe Nodule.  HPI Cough variant asthma: Given sample of Symbicort 160/4.5 with spacer to try at last appointment. Patient previously also treated with Breo and Ultram without effect. He reports he used the Symbicort for 2-3 weeks without significant help. He has since stopped using it. He is still coughing intermittently.   Chronic allergic rhinitis: Previously evaluated by ENT without reported abnormality. Previously on Singulair and over-the-counter antihistamine therapy. He reports his sinus congestion & drainage has improved somewhat. He is using Loratadine & Singulair.   GERD: Small sliding-type hiatal hernia on esophagram. Previously prescribed Protonix twice a day & Zantac twice a day. No reflux or dyspepsia. Adherent to regimen.   OSA:  S/P UPPP surgery. Previously nonadherent to CPAP. He has returned back to using his CPAP. He reports his sleep quality is better. He is sleeping 6-7 hours a night.   Right middle lobe nodule: 4 mm and initially seen on CT imaging in October 2017. Patient previously concerned regarding cost of repeat image testing. Patient declined further imaging.  Review of Systems No fever, chills, or sweats. No chest pain or pressure. No rashes or bruising.   No Known Allergies  Current Outpatient Prescriptions on File Prior to Visit  Medication Sig Dispense Refill  . aspirin EC 81 MG tablet Take 81 mg by mouth daily.    . Cetirizine HCl (ZYRTEC ALLERGY PO) Take 10 mg by mouth daily.    Marland Kitchen glimepiride (AMARYL) 4 MG tablet Take 4 mg by mouth daily with breakfast.     . metFORMIN (GLUCOPHAGE-XR) 500 MG 24 hr tablet Take 1,000 mg by mouth 2 (two) times daily.     . montelukast (SINGULAIR) 10 MG tablet TAKE 1 TABLET BY MOUTH AT BEDTIME 30 tablet 3  . pantoprazole (PROTONIX) 40  MG tablet TAKE 1 TABLET BY MOUTH TWICE DAILY 60 tablet 3  . ranitidine (ZANTAC) 150 MG tablet Take 1 tablet (150 mg total) by mouth 2 (two) times daily. 60 tablet 3  . Spacer/Aero-Holding Chambers (AEROCHAMBER MV) inhaler Use as instructed 1 each 0   No current facility-administered medications on file prior to visit.     Past Medical History:  Diagnosis Date  . Diabetes ([place])   . Hyperlipidemia   . Hypertension   . NSTEMI (non-ST elevated myocardial infarction) ([place])   . Obesity     Past Surgical History:  Procedure Laterality Date  . ACHILLES TENDON REPAIR Right   . heart stent  2012   x1  . SHOULDER ARTHROSCOPY Right   . UVULOPALATOPHARYNGOPLASTY      Family History  Problem Relation Age of Onset  . Cancer Other   . Diabetes Other   . Heart disease Other   . Breast cancer Mother   . Heart disease Father   . Diabetes Father   . Leukemia Brother   . Asthma Maternal Uncle     Social History   Social History  . Marital status: Married    Spouse name: N/A  . Number of children: N/A  . Years of education: N/A   Social History Main Topics  . Smoking status: Passive Smoke Exposure - Never Smoker  . Smokeless tobacco: Never Used     Comment: Both parents growing up.   . Alcohol use Yes  Comment: 4-5 on weekends  . Drug use: No  . Sexual activity: Not on file   Other Topics Concern  . Not on file   Social History Narrative   Originally from [place], [place]. Previously has lived in [place]. 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 No pets currently. No bird or hot tub exposure. Could have mold in his current home and has lived there for 1.5 years now.       Objective:   Physical Exam BP 134/72 (BP Location: Left Arm, Cuff Size: Normal)   Pulse 89   Ht '5\' 11"'  (1.803 m)   Wt (!) 314 lb 4 oz (142.5 kg)   SpO2 96%   BMI 43.83 kg/m   General:  No distress. Obese. Alert. Integument:  No rash. Warm.  Dry. Extremities:  No cyanosis or clubbing.  HEENT:  Moderate bilateral nasal torted swelling left greater than right with pale mucosa. Moist mucous membranes. Scleral icterus. Cardiovascular:  Regular rate. Pitting bilateral lower extremity edema. No JVD.  Pulmonary:  Clear bilaterally with auscultation. Normal work of breathing on room air. Abdomen: Soft. Normal bowel sounds. Protuberant. Musculoskeletal:  Normal bulk and tone. No joint deformity or effusion appreciated. Neurological:  Cranial nerves 2-12 grossly in tact. No meningismus. Moving all 4 extremities equally.   PFT 12/27/15: FVC 4.18 L (81%) FEV1 3.30 L (83%) FEV1/FVC 0.79 FEF 25-75 3.4 L (100%) negative bronchodilator response TLC 6.16 L (92%) RV 110% ERV 19% DLCO uncorrected 107%  IMAGING BARIUM SWALLOW/ESOPHAGRAM 11/28/15 (per radiologist): Small sliding-type hiatal hernia with prominent spontaneous gastroesophageal reflux to the thoracic inlet. Barium tablet passed easily without delay.  CT CHEST W/O 11/28/15 (previously reviewed by me): 4 mm nodule noted within right middle lobe. No other opacity appreciated. No/ /pathologic mediastinal adenopathy. No pleural effusion or thickening. No pericardial effusion.  CXR PA/LAT 09/26/15 (previously reviewed by me): Low lung volumes. No parenchymal nodule or opacity appreciated. No pleural effusion. Heart normal in size & mediastinum normal in contour.  CARDIAC TTE (11/28/15): LV normal in size with focal basal septal hypertrophy that is mild. Grade 1 diastolic dysfunction with EF 55-60% and no regional wall motion abnormalities. LA & RA normal in size. RV normal in size and function. No aortic stenosis or regurgitation. Aortic root normal in size. No mitral stenosis or regurgitation. No pulmonic stenosis. No tricuspid regurgitation. No pericardial effusion.  LABS 11/16/15 CBC: 9.7/16.3/47.8/291 BMP: 136/4.4/101/26/17/1.07/234/9.1 LFT: 3.8/7.1/0.5/71/13/19 ProBNP: 13.0 CRP:  0.4 ESR: 26 IgE: 116 RAST Panel:  [place] Grass 4.12 / Common Ragweed 6.86 / Dog Dander 6.35 / Multiple other positives ANA:  Negative Anti-CCP:  <16 RF:  <14    Assessment & Plan:  56 y.o. male with continued intermittent cough. He has not responded to treatment with multiple inhaler medications and I feel this likely represents cough from postnasal drainage with his chronic allergic rhinitis. Overall his allergic rhinitis seems to be well-controlled with his current regimen and thus his cough. His reflux is nonexistent with his current medication regimen as well. The patient has gained approximately 35 pounds since his last visit with the addition of Novolin 70/30 to his diabetic regimen. As such, he has resumed use of his CPAP nightly. He reports good sleep quality with its use. I instructed the patient to contact our office if he had any new breathing problems or questions before his next appointment.  1. Cough:  Doubtful this represents asthma now. Continuing  treatment of chronic allergic rhinitis. 2. GERD: Controlled with Protonix & Zantac. No changes. 3. Chronic allergic rhinitis: Reasonably controlled with loratadine and Singulair. No new medications. 4. OSA: Patient has restarted CPAP therapy. Encouraged him to continue this with his recent weight gain. 5. Health maintenance: Reports he received the influenza vaccine last week. 6. Follow-up: Return to clinic in 6 months or sooner if needed.  Sonia Baller Ashok Cordia, M.D. [facility] Pager:  [phone] After 3pm or if no response, call [phone] 5:05 PM 12/11/16

## 2016-12-11 NOTE — Patient Instructions (Addendum)
   We are not changing your medications.  Call our office if you have any new breathing problems or need to be seen sooner.

## 2016-12-17 DIAGNOSIS — L089 Local infection of the skin and subcutaneous tissue, unspecified: Secondary | ICD-10-CM | POA: Diagnosis not present

## 2017-01-01 DIAGNOSIS — E1140 Type 2 diabetes mellitus with diabetic neuropathy, unspecified: Secondary | ICD-10-CM | POA: Diagnosis not present

## 2017-01-01 DIAGNOSIS — N529 Male erectile dysfunction, unspecified: Secondary | ICD-10-CM | POA: Diagnosis not present

## 2017-01-01 DIAGNOSIS — L089 Local infection of the skin and subcutaneous tissue, unspecified: Secondary | ICD-10-CM | POA: Diagnosis not present

## 2017-01-01 DIAGNOSIS — I10 Essential (primary) hypertension: Secondary | ICD-10-CM | POA: Diagnosis not present

## 2017-01-08 ENCOUNTER — Other Ambulatory Visit: Payer: Self-pay | Admitting: Pulmonary Disease

## 2017-01-14 ENCOUNTER — Other Ambulatory Visit: Payer: Self-pay | Admitting: Pulmonary Disease

## 2017-02-18 ENCOUNTER — Ambulatory Visit
Admission: RE | Admit: 2017-02-18 | Discharge: 2017-02-18 | Disposition: A | Discharge status: Home / Self Care | Payer: BLUE CROSS/BLUE SHIELD | Source: Ambulatory Visit | Attending: Physician Assistant | Admitting: Physician Assistant

## 2017-02-18 ENCOUNTER — Other Ambulatory Visit: Payer: Self-pay | Admitting: Physician Assistant

## 2017-02-18 ENCOUNTER — Encounter: Payer: BLUE CROSS/BLUE SHIELD | Attending: Physician Assistant | Admitting: Physician Assistant

## 2017-02-18 DIAGNOSIS — G4730 Sleep apnea, unspecified: Secondary | ICD-10-CM | POA: Insufficient documentation

## 2017-02-18 DIAGNOSIS — R600 Localized edema: Secondary | ICD-10-CM | POA: Diagnosis not present

## 2017-02-18 DIAGNOSIS — E11621 Type 2 diabetes mellitus with foot ulcer: Secondary | ICD-10-CM | POA: Diagnosis not present

## 2017-02-18 DIAGNOSIS — Z6841 Body Mass Index (BMI) 40.0 and over, adult: Secondary | ICD-10-CM | POA: Diagnosis not present

## 2017-02-18 DIAGNOSIS — M109 Gout, unspecified: Secondary | ICD-10-CM | POA: Insufficient documentation

## 2017-02-18 DIAGNOSIS — X58XXXA Exposure to other specified factors, initial encounter: Secondary | ICD-10-CM | POA: Insufficient documentation

## 2017-02-18 DIAGNOSIS — S91101A Unspecified open wound of right great toe without damage to nail, initial encounter: Secondary | ICD-10-CM | POA: Diagnosis not present

## 2017-02-18 DIAGNOSIS — E1140 Type 2 diabetes mellitus with diabetic neuropathy, unspecified: Secondary | ICD-10-CM | POA: Diagnosis not present

## 2017-02-18 DIAGNOSIS — M1990 Unspecified osteoarthritis, unspecified site: Secondary | ICD-10-CM | POA: Insufficient documentation

## 2017-02-18 DIAGNOSIS — L97519 Non-pressure chronic ulcer of other part of right foot with unspecified severity: Secondary | ICD-10-CM | POA: Diagnosis not present

## 2017-02-18 DIAGNOSIS — S81801A Unspecified open wound, right lower leg, initial encounter: Secondary | ICD-10-CM

## 2017-02-18 DIAGNOSIS — I252 Old myocardial infarction: Secondary | ICD-10-CM | POA: Insufficient documentation

## 2017-02-18 DIAGNOSIS — I10 Essential (primary) hypertension: Secondary | ICD-10-CM | POA: Insufficient documentation

## 2017-02-18 DIAGNOSIS — L97512 Non-pressure chronic ulcer of other part of right foot with fat layer exposed: Secondary | ICD-10-CM | POA: Diagnosis not present

## 2017-02-19 NOTE — Progress Notes (Signed)
James RomeCROMER, Quin C. ([id number]) Visit Report for 02/18/2017 Abuse/Suicide Risk Screen Details Patient Name: [name], James MontgomeryROBERT C. Date of Service: 02/18/2017 1:30 PM Medical Record Number: [id number] Patient Account Number: [id number] Date of Birth/Sex: 04/27/1960 [age] y.o. Male) Treating RN: Huel CoventryWoody, Kim Primary Care Tala Eber: Marden NobleGATES, Thierry Other Clinician: Referring Jakaylah Schlafer: Marden NobleGATES, Draco Treating Shelley Pooley/Extender: Linwood DibblesSTONE III, HOYT Weeks in Treatment: 0 Abuse/Suicide Risk Screen Items Answer ABUSE/SUICIDE RISK SCREEN: Has anyone close to you tried to hurt or harm you recentlyo No Do you feel uncomfortable with anyone in your familyo No Has anyone forced you do things that you didnot want to doo No Do you have any thoughts of harming yourselfo No Patient displays signs or symptoms of abuse and/or neglect. No Electronic Signature(s) Signed: 02/18/2017 5:15:14 PM By: Elliot GurneyWoody, BSN, RN, CWS, Kim RN, BSN Entered By: Elliot GurneyWoody, BSN, RN, CWS, Kim on 02/18/2017 13:42:41 [name], [name] ([id number]) -------------------------------------------------------------------------------- Activities of Daily Living Details Patient Name: [name], [name] Date of Service: 02/18/2017 1:30 PM Medical Record Number: [id number] Patient Account Number: [id number] Date of Birth/Sex: 04/27/1960 [age] y.o. Male) Treating RN: Huel CoventryWoody, Kim Primary Care Patricia Fargo: Marden NobleGATES, Alexios Other Clinician: Referring Avis Tirone: Marden NobleGATES, Bayley Treating Jocilynn Grade/Extender: Linwood DibblesSTONE III, HOYT Weeks in Treatment: 0 Activities of Daily Living Items Answer Activities of Daily Living (Please select one for each item) Drive Automobile Completely Able Take Medications Completely Able Use Telephone Completely Able Care for Appearance Completely Able Use Toilet Completely Able Bath / Shower Completely Able Dress Self Completely Able Feed Self Completely Able Walk Completely Able Get In / Out Bed Completely Able Housework Completely Able Prepare Meals  Completely Able Handle Money Completely Able Shop for Self Completely Able Electronic Signature(s) Signed: 02/18/2017 5:15:14 PM By: Elliot GurneyWoody, BSN, RN, CWS, Kim RN, BSN Entered By: Elliot GurneyWoody, BSN, RN, CWS, Kim on 02/18/2017 13:42:59 [name], [name] ([id number]) -------------------------------------------------------------------------------- Education Assessment Details Patient Name: [name], [name] Date of Service: 02/18/2017 1:30 PM Medical Record Number: [id number] Patient Account Number: [id number] Date of Birth/Sex: 04/27/1960 [age] y.o. Male) Treating RN: Huel CoventryWoody, Kim Primary Care Neriyah Cercone: Marden NobleGATES, Axyl Other Clinician: Referring Jawaan Adachi: Marden NobleGATES, Ayiden Treating Makyle Eslick/Extender: Skeet SimmerSTONE III, HOYT Weeks in Treatment: 0 Primary Learner Assessed: Patient Learning Preferences/Education Level/Primary Language Learning Preference: Explanation, Demonstration Highest Education Level: College or Above Preferred Language: English Cognitive Barrier Assessment/Beliefs Language Barrier: No Translator Needed: No Memory Deficit: No Emotional Barrier: No Cultural/Religious Beliefs Affecting Medical Care: No Physical Barrier Assessment Impaired Vision: Yes Glasses Impaired Hearing: No Decreased Hand dexterity: No Knowledge/Comprehension Assessment Knowledge Level: High Comprehension Level: High Ability to understand written High instructions: Ability to understand verbal High instructions: Motivation Assessment Anxiety Level: Calm Cooperation: Cooperative Education Importance: Acknowledges Need Interest in Health Problems: Asks Questions Perception: Coherent Willingness to Engage in Self- High Management Activities: Readiness to Engage in Self- High Management Activities: Electronic Signature(s) Signed: 02/18/2017 5:15:14 PM By: Elliot GurneyWoody, BSN, RN, CWS, Kim RN, BSN Entered By: Elliot GurneyWoody, BSN, RN, CWS, Kim on 02/18/2017 13:43:30 [name], [name]  ([id number]) -------------------------------------------------------------------------------- Fall Risk Assessment Details Patient Name: [name], [name] Date of Service: 02/18/2017 1:30 PM Medical Record Number: [id number] Patient Account Number: [id number] Date of Birth/Sex: 04/27/1960 [age] y.o. Male) Treating RN: Huel CoventryWoody, Kim Primary Care Shirell Struthers: Marden NobleGATES, [place] Other Clinician: Referring Emelina Hinch: Marden NobleGATES, Kenta Treating Keishawna Carranza/Extender: Linwood DibblesSTONE III, HOYT Weeks in Treatment: 0 Fall Risk Assessment Items Have you had 2 or more falls in the last 12 monthso 0 No Have you had any fall that resulted in injury in the last 12 monthso 0 No FALL RISK ASSESSMENT:  History of falling - immediate or within 3 months 0 No Secondary diagnosis 0 No Ambulatory aid None/bed rest/wheelchair/nurse 0 Yes Crutches/cane/walker 0 No Furniture 0 No IV Access/Saline Lock 0 No Gait/Training Normal/bed rest/immobile 0 Yes Weak 0 No Impaired 0 No Mental Status Oriented to own ability 0 Yes Electronic Signature(s) Signed: 02/18/2017 5:15:14 PM By: Elliot Gurney, BSN, RN, CWS, Kim RN, BSN Entered By: Elliot Gurney, BSN, RN, CWS, Kim on 02/18/2017 13:44:01 [name], [name] ([id number]) -------------------------------------------------------------------------------- Foot Assessment Details Patient Name: [name], [name] Date of Service: 02/18/2017 1:30 PM Medical Record Number: [id number] Patient Account Number: [id number] Date of Birth/Sex: 1961-01-16 (57 y.o. Male) Treating RN: Huel Coventry Primary Care Demaree Liberto: Marden Noble Other Clinician: Referring Joyce Heitman: Marden Noble Treating Jenifer Struve/Extender: Linwood Dibbles, HOYT Weeks in Treatment: 0 Foot Assessment Items Site Locations + = Sensation present, - = Sensation absent, C = Callus, U = Ulcer R = Redness, W = Warmth, M = Maceration, PU = Pre-ulcerative lesion F = Fissure, S = Swelling, D = Dryness Assessment Right: Left: Other Deformity: No No Prior Foot Ulcer: Yes  No Prior Amputation: No No Charcot Joint: No No Ambulatory Status: Ambulatory Without Help Gait: Steady Electronic Signature(s) Signed: 02/18/2017 5:15:14 PM By: Elliot Gurney, BSN, RN, CWS, Kim RN, BSN Entered By: Elliot Gurney, BSN, RN, CWS, Kim on 02/18/2017 13:54:53 [name], [name] ([id number]) -------------------------------------------------------------------------------- Nutrition Risk Assessment Details Patient Name: Piedra, Cottrell C. Date of Service: 02/18/2017 1:30 PM Medical Record Number: [id number] Patient Account Number: [id number] Date of Birth/Sex: [birth year] (57 y.o. Male) Treating RN: Huel Coventry Primary Care Miosha Behe: Marden Noble Other Clinician: Referring Akiem Urieta: Marden Noble Treating Mitsugi Schrader/Extender: Linwood Dibbles, HOYT Weeks in Treatment: 0 Height (in): 71 Weight (lbs): 318.4 Body Mass Index (BMI): 44.4 Nutrition Risk Assessment Items NUTRITION RISK SCREEN: I have an illness or condition that made me change the kind and/or amount of 0 No food I eat I eat fewer than two meals per day 3 Yes I eat few fruits and vegetables, or milk products 0 No I have three or more drinks of beer, liquor or wine almost every day 0 No I have tooth or mouth problems that make it hard for me to eat 0 No I don't always have enough money to buy the food I need 0 No I eat alone most of the time 0 No I take three or more different prescribed or over-the-counter drugs a day 1 Yes Without wanting to, I have lost or gained 10 pounds in the last six months 0 No I am not always physically able to shop, cook and/or feed myself 0 No Nutrition Protocols Good Risk Protocol Provide education on elevated blood sugars and Moderate Risk Protocol 0 impact on wound healing, as applicable Electronic Signature(s) Signed: 02/18/2017 5:15:14 PM By: Elliot Gurney, BSN, RN, CWS, Kim RN, BSN Entered By: Elliot Gurney, BSN, RN, CWS, Kim on 02/18/2017 13:44:31

## 2017-02-19 NOTE — Progress Notes (Signed)
ROSA, GAMBALE ([id number]) Visit Report for 02/18/2017 Chief Complaint Document Details Patient Name: [name], [name]. Date of Service: 02/18/2017 1:30 PM Medical Record Number: [id number] Patient Account Number: [id number] Date of Birth/Sex: 1960-09-28 (57 y.o. Male) Treating RN: Huel Coventry Primary Care Provider: Marden Noble Other Clinician: Referring Provider: Marden Noble Treating Provider/Extender: Linwood Dibbles, HOYT Weeks in Treatment: 0 Information Obtained from: Patient Chief Complaint Right 1st Toe Plantar Ulcer Electronic Signature(s) Signed: 02/19/2017 9:34:20 AM By: Lenda Kelp PA-C Entered By: Lenda Kelp on 02/19/2017 08:38:29 [name], [name] ([id number]) -------------------------------------------------------------------------------- Debridement Details Patient Name: [name], [name] Date of Service: 02/18/2017 1:30 PM Medical Record Number: [id number] Patient Account Number: [id number] Date of Birth/Sex: 10/12/1960 (57 y.o. Male) Treating RN: Huel Coventry Primary Care Provider: Marden Noble Other Clinician: Referring Provider: Marden Noble Treating Provider/Extender: Linwood Dibbles, HOYT Weeks in Treatment: 0 Debridement Performed for Wound #1 Right,Plantar Toe Great Assessment: Performed By: Physician STONE III, HOYT E., PA-C Debridement: Debridement Severity of Tissue Pre Fat layer exposed Debridement: Pre-procedure Verification/Time Yes - 14:31 Out Taken: Start Time: 14:31 Pain Control: Other : lidocaine 4% Level: Skin/Subcutaneous Tissue Total Area Debrided (L x W): 0.5 (cm) x 1 (cm) = 0.5 (cm) Tissue and other material Viable, Non-Viable, Callus, Subcutaneous debrided: Instrument: Curette Bleeding: Minimum Hemostasis Achieved: Pressure End Time: 14:38 Procedural Pain: 0 Post Procedural Pain: 0 Response to Treatment: Procedure was tolerated well Post Debridement Measurements of Total Wound Length: (cm) 0.5 Width: (cm) 1 Depth: (cm)  0.3 Volume: (cm) 0.118 Character of Wound/Ulcer Post Debridement: Stable Severity of Tissue Post Debridement: Fat layer exposed Post Procedure Diagnosis Same as Pre-procedure Electronic Signature(s) Signed: 02/18/2017 5:15:14 PM By: Elliot Gurney, BSN, RN, CWS, Kim RN, BSN Signed: 02/19/2017 9:34:20 AM By: Lenda Kelp PA-C Entered By: Elliot Gurney, BSN, RN, CWS, Kim on 02/18/2017 14:38:31 Ross, [name] ([id number]) -------------------------------------------------------------------------------- HPI Details Patient Name: [name], [name] Date of Service: 02/18/2017 1:30 PM Medical Record Number: [id number] Patient Account Number: [id number] Date of Birth/Sex: July 17, 1960 (57 y.o. Male) Treating RN: Huel Coventry Primary Care Provider: Marden Noble Other Clinician: Referring Provider: Marden Noble Treating Provider/Extender: Linwood Dibbles, HOYT Weeks in Treatment: 0 History of Present Illness Associated Signs and Symptoms: Patient has a history of diabetes mellitus which is under okay control, obesity, bilateral lower extremity leg swelling, diabetic neuropathy, and hypertension HPI Description: 02/19/16 on evaluation today patient presents for initial evaluation concerning an ulcer on his right great toe which has been present for roughly 6 months to a year. He does not know exactly when he first noted this. Nonetheless he is diabetic and is a once he runs around 7.1 currently and so is under the okay control. He also is obese and has chronic bilateral lower extremity swelling. Patient also does have diabetic neuropathy unfortunately. For this reason he unfortunately did have an injury to his right great toe which he was initially unaware of and then since has been "doctoring on himself" unfortunately it has not seem to really improve though the good news is it does not seem to extend to deeply. He has not really seen too many people for this other than his primary care provider at this point that [facility]  physicians Dr. Kevan Ny. He was at one time put on antibiotics for this although it has been completed this was Septra but that was back in November 2018. Currently there is no evidence of infection. Patient does tell me that he attempted to work on some the callous himself but  really does not have a good angle to be able to see what is going on down there. He does have a significant amount of callous surrounding the wound bed. Electronic Signature(s) Signed: 02/19/2017 9:34:20 AM By: Lenda Kelp PA-C Entered By: Lenda Kelp on 02/19/2017 09:29:04 [name], [name] ([id number]) -------------------------------------------------------------------------------- Physical Exam Details Patient Name: [name], [name] Date of Service: 02/18/2017 1:30 PM Medical Record Number: [id number] Patient Account Number: [id number] Date of Birth/Sex: 08-Jun-1960 (57 y.o. Male) Treating RN: Huel Coventry Primary Care Provider: Marden Noble Other Clinician: Referring Provider: Marden Noble Treating Provider/Extender: Linwood Dibbles, HOYT Weeks in Treatment: 0 Constitutional sitting or standing blood pressure is within target range for patient.. pulse regular and within target range for patient.Marland Kitchen respirations regular, non-labored and within target range for patient.Marland Kitchen temperature within target range for patient.. Obese and well-hydrated in no acute distress. Eyes conjunctiva clear no eyelid edema noted. pupils equal round and reactive to light and accommodation. Ears, Nose, Mouth, and Throat no gross abnormality of ear auricles or external auditory canals. normal hearing noted during conversation. mucus membranes moist. Respiratory normal breathing without difficulty. clear to auscultation bilaterally. Cardiovascular regular rate and rhythm with normal S1, S2. 2+ dorsalis pedis/posterior tibialis pulses. 1+ pitting edema of the bilateral lower extremities. Gastrointestinal (GI) soft, non-tender, non-distended,  +BS. no ventral hernia noted. Musculoskeletal normal gait and posture. no significant deformity or arthritic changes, no loss or range of motion, no clubbing. Psychiatric this patient is able to make decisions and demonstrates good insight into disease process. Alert and Oriented x 3. pleasant and cooperative. Notes Patient's wound did have significant callous surrounding the wound bed that was even copy no over top of the wound about halfway on the proximal portion. He did require significant debridement to clean away this callous and I did debride the wound bed as well cleaning this up nicely. Post debridement the wound appear to be doing significantly better which is excellent news. He tolerated this without any discomfort or significant pain. Electronic Signature(s) Signed: 02/19/2017 9:34:20 AM By: Lenda Kelp PA-C Entered By: Lenda Kelp on 02/19/2017 09:31:19 [name], [name] ([id number]) -------------------------------------------------------------------------------- Physician Orders Details Patient Name: [name], [name] Date of Service: 02/18/2017 1:30 PM Medical Record Number: [id number] Patient Account Number: [id number] Date of Birth/Sex: August 23, 1960 (57 y.o. Male) Treating RN: Huel Coventry Primary Care Provider: Marden Noble Other Clinician: Referring Provider: Marden Noble Treating Provider/Extender: Linwood Dibbles, HOYT Weeks in Treatment: 0 Verbal / Phone Orders: No Diagnosis Coding ICD-10 Coding Code Description E11.621 Type 2 diabetes mellitus with foot ulcer L97.512 Non-pressure chronic ulcer of other part of right foot with fat layer exposed E66.01 Morbid (severe) obesity due to excess calories I10 Essential (primary) hypertension E11.40 Type 2 diabetes mellitus with diabetic neuropathy, unspecified Wound Cleansing Wound #1 Right,Plantar Toe Great o Clean wound with Normal Saline. o May Shower, gently pat wound dry prior to applying new  dressing. Anesthetic (add to Medication List) Wound #1 Right,Plantar Toe Great o Topical Lidocaine 4% cream applied to wound bed prior to debridement (In Clinic Only). Primary Wound Dressing Wound #1 Right,Plantar Toe Great o Silvercel Non-Adherent Secondary Dressing Wound #1 Right,Plantar Toe Great o Other - Felt Dressing Change Frequency Wound #1 Right,Plantar Toe Great o Change Dressing Monday, Wednesday, Friday Follow-up Appointments Wound #1 Right,Plantar Toe Great o Return Appointment in 2 weeks. Additional Orders / Instructions Wound #1 Right,Plantar Toe Great o Increase protein intake. o Activity as tolerated o Other: - Multivitamin Radiology Urquiza, Falon  Salena Saner ([id number]) o X-ray, toes - Right great toe Electronic Signature(s) Signed: 02/18/2017 5:15:14 PM By: Elliot Gurney, BSN, RN, CWS, Kim RN, BSN Signed: 02/19/2017 9:34:20 AM By: Lenda Kelp PA-C Entered By: Elliot Gurney, BSN, RN, CWS, Kim on 02/18/2017 14:50:05 Ploch, [name] ([id number]) -------------------------------------------------------------------------------- Problem List Details Patient Name: [name], [name] Date of Service: 02/18/2017 1:30 PM Medical Record Number: [id number] Patient Account Number: [id number] Date of Birth/Sex: [birth year] (57 y.o. Male) Treating RN: Huel Coventry Primary Care Provider: Marden Noble Other Clinician: Referring Provider: Marden Noble Treating Provider/Extender: Linwood Dibbles, HOYT Weeks in Treatment: 0 Active Problems ICD-10 Encounter Code Description Active Date Diagnosis E11.621 Type 2 diabetes mellitus with foot ulcer 02/18/2017 Yes L97.512 Non-pressure chronic ulcer of other part of right foot with fat layer 02/18/2017 Yes exposed E66.01 Morbid (severe) obesity due to excess calories 02/18/2017 Yes I10 Essential (primary) hypertension 02/18/2017 Yes E11.40 Type 2 diabetes mellitus with diabetic neuropathy, unspecified 02/18/2017 Yes Inactive Problems Resolved  Problems Electronic Signature(s) Signed: 02/19/2017 9:34:20 AM By: Lenda Kelp PA-C Entered By: Lenda Kelp on 02/18/2017 14:26:01 Daudelin, [name] ([id number]) -------------------------------------------------------------------------------- Progress Note Details Patient Name: [name], [name] Date of Service: 02/18/2017 1:30 PM Medical Record Number: [id number] Patient Account Number: [id number] Date of Birth/Sex: 1960/03/06 (57 y.o. Male) Treating RN: Huel Coventry Primary Care Provider: Marden Noble Other Clinician: Referring Provider: Marden Noble Treating Provider/Extender: Linwood Dibbles, HOYT Weeks in Treatment: 0 Subjective Chief Complaint Information obtained from Patient Right 1st Toe Plantar Ulcer History of Present Illness (HPI) The following HPI elements were documented for the patient's wound: Associated Signs and Symptoms: Patient has a history of diabetes mellitus which is under okay control, obesity, bilateral lower extremity leg swelling, diabetic neuropathy, and hypertension 02/19/16 on evaluation today patient presents for initial evaluation concerning an ulcer on his right great toe which has been present for roughly 6 months to a year. He does not know exactly when he first noted this. Nonetheless he is diabetic and is a once he runs around 7.1 currently and so is under the okay control. He also is obese and has chronic bilateral lower extremity swelling. Patient also does have diabetic neuropathy unfortunately. For this reason he unfortunately did have an injury to his right great toe which he was initially unaware of and then since has been "doctoring on himself" unfortunately it has not seem to really improve though the good news is it does not seem to extend to deeply. He has not really seen too many people for this other than his primary care provider at this point that [facility] physicians Dr. Kevan Ny. He was at one time put on antibiotics for this although it has been  completed this was Septra but that was back in November 2018. Currently there is no evidence of infection. Patient does tell me that he attempted to work on some the callous himself but really does not have a good angle to be able to see what is going on down there. He does have a significant amount of callous surrounding the wound bed. Wound History Patient presents with 1 open wound that has been present for approximately 6 months. Patient has been treating wound in the following manner: soaking and bandage with neosporin. Laboratory tests have not been performed in the last month. Patient reportedly has not tested positive for an antibiotic resistant organism. Patient reportedly has not tested positive for osteomyelitis. Patient reportedly has not had testing performed to evaluate circulation in the legs. Patient History Information obtained  from Patient. Allergies No Known Drug Allergies Family History Cancer - Mother, Diabetes - Father, Heart Disease - Father, Hypertension - Father, Stroke - Father, No family history of Kidney Disease, Lung Disease, Seizures, Thyroid Problems, Tuberculosis. Social History Never smoker, Marital Status - Divorced, Alcohol Use - Moderate, Drug Use - Prior History, Caffeine Use - Moderate. Medical History Eyes Denies history of Cataracts, Glaucoma, Optic Neuritis Ear/Nose/Mouth/Throat Denies history of Chronic sinus problems/congestion, Middle ear problems [name], [name]. ([id number]) Hematologic/Lymphatic Denies history of Anemia, Hemophilia, Human Immunodeficiency Virus, Lymphedema, Sickle Cell Disease Respiratory Patient has history of Sleep Apnea - C-pap Denies history of Aspiration, Asthma, Chronic Obstructive Pulmonary Disease (COPD), Pneumothorax, Tuberculosis Cardiovascular Patient has history of Hypertension, Myocardial Infarction - 2013 Denies history of Angina, Arrhythmia, Congestive Heart Failure, Coronary Artery Disease, Deep Vein  Thrombosis, Hypotension, Peripheral Arterial Disease, Peripheral Venous Disease, Phlebitis, Vasculitis Gastrointestinal Denies history of Cirrhosis , Colitis, Crohn s, Hepatitis A, Hepatitis B, Hepatitis C Endocrine Patient has history of Type II Diabetes Denies history of Type I Diabetes Genitourinary Denies history of End Stage Renal Disease Immunological Denies history of Lupus Erythematosus, Raynaud s, Scleroderma Integumentary (Skin) Denies history of History of Burn, History of pressure wounds Musculoskeletal Patient has history of Gout, Osteoarthritis Denies history of Rheumatoid Arthritis, Osteomyelitis Neurologic Patient has history of Neuropathy Denies history of Dementia, Quadriplegia, Paraplegia, Seizure Disorder Oncologic Denies history of Received Chemotherapy, Received Radiation Psychiatric Denies history of Anorexia/bulimia, Confinement Anxiety Patient is treated with Insulin, Oral Agents. Blood sugar is tested. Blood sugar results noted at the following times: Breakfast - 220. Review of Systems (ROS) Constitutional Symptoms (General Health) The patient has no complaints or symptoms. Eyes Complains or has symptoms of Glasses / Contacts. Denies complaints or symptoms of Dry Eyes, Vision Changes. Ear/Nose/Mouth/Throat The patient has no complaints or symptoms. Hematologic/Lymphatic The patient has no complaints or symptoms. Respiratory Complains or has symptoms of Chronic or frequent coughs. Denies complaints or symptoms of Shortness of Breath. Cardiovascular The patient has no complaints or symptoms. Gastrointestinal The patient has no complaints or symptoms. Endocrine Denies complaints or symptoms of Hepatitis, Thyroid disease, Polydypsia (Excessive Thirst). Genitourinary The patient has no complaints or symptoms. Immunological The patient has no complaints or symptoms. Integumentary (Skin) Complains or has symptoms of Wounds. Denies complaints or  symptoms of Bleeding or bruising tendency, Breakdown, Swelling. [name], [name] ([id number]) Musculoskeletal The patient has no complaints or symptoms. Neurologic Complains or has symptoms of Numbness/parasthesias - Feet. Oncologic The patient has no complaints or symptoms. Psychiatric The patient has no complaints or symptoms. Objective Constitutional sitting or standing blood pressure is within target range for patient.. pulse regular and within target range for patient.Marland Kitchen respirations regular, non-labored and within target range for patient.Marland Kitchen temperature within target range for patient.. Obese and well-hydrated in no acute distress. Vitals Time Taken: 1:31 PM, Height: 71 in, Source: Measured, Weight: 318.4 lbs, Source: Measured, BMI: 44.4, Temperature: 97.8 F, Pulse: 92 bpm, Respiratory Rate: 16 breaths/min, Blood Pressure: 163/99 mmHg. Eyes conjunctiva clear no eyelid edema noted. pupils equal round and reactive to light and accommodation. Ears, Nose, Mouth, and Throat no gross abnormality of ear auricles or external auditory canals. normal hearing noted during conversation. mucus membranes moist. Respiratory normal breathing without difficulty. clear to auscultation bilaterally. Cardiovascular regular rate and rhythm with normal S1, S2. 2+ dorsalis pedis/posterior tibialis pulses. 1+ pitting edema of the bilateral lower extremities. Gastrointestinal (GI) soft, non-tender, non-distended, +BS. no ventral hernia noted. Musculoskeletal normal gait and posture. no  significant deformity or arthritic changes, no loss or range of motion, no clubbing. Psychiatric this patient is able to make decisions and demonstrates good insight into disease process. Alert and Oriented x 3. pleasant and cooperative. General Notes: Patient's wound did have significant callous surrounding the wound bed that was even copy no over top of the wound about halfway on the proximal portion. He did require  significant debridement to clean away this callous and I did debride the wound bed as well cleaning this up nicely. Post debridement the wound appear to be doing significantly better which is excellent news. He tolerated this without any discomfort or significant pain. Integumentary (Hair, Skin) [name], [name] ([id number]) Wound #1 status is Open. Original cause of wound was Gradually Appeared. The wound is located on the Black & Deckeright,Plantar Toe Great. The wound measures 0.5cm length x 1cm width x 0.1cm depth; 0.393cm^2 area and 0.039cm^3 volume. There is Fat Layer (Subcutaneous Tissue) Exposed exposed. There is no tunneling or undermining noted. There is a medium amount of serous drainage noted. The wound margin is well defined and not attached to the wound base. There is medium (34-66%) red granulation within the wound bed. There is no necrotic tissue within the wound bed. The periwound skin appearance exhibited: Callus. Assessment Active Problems ICD-10 E11.621 - Type 2 diabetes mellitus with foot ulcer L97.512 - Non-pressure chronic ulcer of other part of right foot with fat layer exposed E66.01 - Morbid (severe) obesity due to excess calories I10 - Essential (primary) hypertension E11.40 - Type 2 diabetes mellitus with diabetic neuropathy, unspecified Procedures Wound #1 Pre-procedure diagnosis of Wound #1 is a Diabetic Wound/Ulcer of the Lower Extremity located on the Right,Plantar Toe Great .Severity of Tissue Pre Debridement is: Fat layer exposed. There was a Skin/Subcutaneous Tissue Debridement ([id number]) debridement with total area of 0.5 sq cm performed by STONE III, HOYT E., PA-C. with the following instrument(s): Curette to remove Viable and Non-Viable tissue/material including Callus and Subcutaneous after achieving pain control using Other (lidocaine 4%). A time out was conducted at 14:31, prior to the start of the procedure. A Minimum amount of bleeding was controlled with  Pressure. The procedure was tolerated well with a pain level of 0 throughout and a pain level of 0 following the procedure. Post Debridement Measurements: 0.5cm length x 1cm width x 0.3cm depth; 0.118cm^3 volume. Character of Wound/Ulcer Post Debridement is stable. Severity of Tissue Post Debridement is: Fat layer exposed. Post procedure Diagnosis Wound #1: Same as Pre-Procedure Plan Wound Cleansing: Wound #1 Right,Plantar Toe Great: Clean wound with Normal Saline. May Shower, gently pat wound dry prior to applying new dressing. Anesthetic (add to Medication List): Wound #1 Right,Plantar Toe Great: Topical Lidocaine 4% cream applied to wound bed prior to debridement (In Clinic Only). Primary Wound Dressing: Wound #1 Right,Plantar Toe Great: Silvercel Non-Adherent Secondary Dressing: Prince RomeCROMER, [name] ([id number]) Wound #1 Right,Plantar Toe Great: Other - Felt Dressing Change Frequency: Wound #1 Right,Plantar Toe Great: Change Dressing Monday, Wednesday, Friday Follow-up Appointments: Wound #1 Right,Plantar Toe Great: Return Appointment in 2 weeks. Additional Orders / Instructions: Wound #1 Right,Plantar Toe Great: Increase protein intake. Activity as tolerated Other: - Multivitamin Radiology ordered were: X-ray, toes - Right great toe At this point I'm going to recommend that we initiate treatment per above with the silver alginate dressing. We will also use felt for offloading at this point. I am going to recommend an x-ray of the right foot specifically the great toe to ensure there is not any  evidence of osteomyelitis just to be safe. Patient is in agreement with this plan. Subsequently he did from a cost- conscious standpoint want to see about a follow-up visit in two weeks time. I explained that I'm okay with that although I would like for him to contact us sooner if there is any complication especially in regard to the callus removal today. He is in agreement with this  plan. We will see what the x-ray shows and if anything changes in that regard we will contact him and let him know. Otherwise I did also have a conversation with him about diabetic shoes and shoe inserts. I do think it would be good for him to have both. I recommended that he discuss with his primary care provider at this. Please see above for specific wound care orders. We will see patient for re-evaluation in 2 week(s) here in the clinic. If anything worsens or changes patient will contact our office for additional recommendations. Electronic Signature(s) Signed: 02/19/2017 9:34:20 AM By: Lenda Kelp PA-C Entered By: Lenda Kelp on 02/19/2017 09:33:05 [name], [name] ([id number]) -------------------------------------------------------------------------------- ROS/PFSH Details Patient Name: Mask, Deondrea C. Date of Service: 02/18/2017 1:30 PM Medical Record Number: [id number] Patient Account Number: [id number] Date of Birth/Sex: [birth year] (57 y.o. Male) Treating RN: Huel Coventry Primary Care Provider: Marden Noble Other Clinician: Referring Provider: Marden Noble Treating Provider/Extender: Linwood Dibbles, HOYT Weeks in Treatment: 0 Information Obtained From Patient Wound History Do you currently have one or more open woundso Yes How many open wounds do you currently haveo 1 Approximately how long have you had your woundso 6 months How have you been treating your wound(s) until nowo soaking and bandage with neosporin Has your wound(s) ever healed and then re-openedo No Have you had any lab work done in the past montho No Have you tested positive for an antibiotic resistant organism (MRSA, VRE)o No Have you tested positive for osteomyelitis (bone infection)o No Have you had any tests for circulation on your legso No Eyes Complaints and Symptoms: Positive for: Glasses / Contacts Negative for: Dry Eyes; Vision Changes Medical History: Negative for: Cataracts; Glaucoma; Optic  Neuritis Hematologic/Lymphatic Complaints and Symptoms: No Complaints or Symptoms Complaints and Symptoms: Negative for: Bleeding / Clotting Disorders; Human Immunodeficiency Virus Medical History: Negative for: Anemia; Hemophilia; Human Immunodeficiency Virus; Lymphedema; Sickle Cell Disease Respiratory Complaints and Symptoms: Positive for: Chronic or frequent coughs Negative for: Shortness of Breath Medical History: Positive for: Sleep Apnea - C-pap Negative for: Aspiration; Asthma; Chronic Obstructive Pulmonary Disease (COPD); Pneumothorax; Tuberculosis Gastrointestinal Complaints and Symptoms: No Complaints or Symptoms Complaints and Symptoms: Negative for: Frequent diarrhea; Nausea; Vomiting Gaza, Becker C. ([id number]) Medical History: Negative for: Cirrhosis ; Colitis; Crohnos; Hepatitis A; Hepatitis B; Hepatitis C Endocrine Complaints and Symptoms: Negative for: Hepatitis; Thyroid disease; Polydypsia (Excessive Thirst) Medical History: Positive for: Type II Diabetes Negative for: Type I Diabetes Time with diabetes: 2017 Treated with: Insulin, Oral agents Blood sugar tested every day: Yes Tested : daily Blood sugar testing results: Breakfast: 220 Integumentary (Skin) Complaints and Symptoms: Positive for: Wounds Negative for: Bleeding or bruising tendency; Breakdown; Swelling Medical History: Negative for: History of Burn; History of pressure wounds Musculoskeletal Complaints and Symptoms: No Complaints or Symptoms Complaints and Symptoms: Negative for: Muscle Pain; Muscle Weakness Medical History: Positive for: Gout; Osteoarthritis Negative for: Rheumatoid Arthritis; Osteomyelitis Neurologic Complaints and Symptoms: Positive for: Numbness/parasthesias - Feet Medical History: Positive for: Neuropathy Negative for: Dementia; Quadriplegia; Paraplegia; Seizure Disorder Constitutional Symptoms (General Health) Complaints and  Symptoms: No Complaints or  Symptoms Ear/Nose/Mouth/Throat Complaints and Symptoms: No Complaints or Symptoms Medical History: SAKIB, NOGUEZ ([id number]) Negative for: Chronic sinus problems/congestion; Middle ear problems Cardiovascular Complaints and Symptoms: No Complaints or Symptoms Medical History: Positive for: Hypertension; Myocardial Infarction - 2013 Negative for: Angina; Arrhythmia; Congestive Heart Failure; Coronary Artery Disease; Deep Vein Thrombosis; Hypotension; Peripheral Arterial Disease; Peripheral Venous Disease; Phlebitis; Vasculitis Genitourinary Complaints and Symptoms: No Complaints or Symptoms Medical History: Negative for: End Stage Renal Disease Immunological Complaints and Symptoms: No Complaints or Symptoms Medical History: Negative for: Lupus Erythematosus; Raynaudos; Scleroderma Oncologic Complaints and Symptoms: No Complaints or Symptoms Medical History: Negative for: Received Chemotherapy; Received Radiation Psychiatric Complaints and Symptoms: No Complaints or Symptoms Medical History: Negative for: Anorexia/bulimia; Confinement Anxiety Immunizations Pneumococcal Vaccine: Received Pneumococcal Vaccination: No Tetanus Vaccine: Last tetanus shot: 02/12/2013 Implantable Devices Family and Social History Cancer: Yes - Mother; Diabetes: Yes - Father; Heart Disease: Yes - Father; Hypertension: Yes - Father; Kidney Disease: No; Lung Disease: No; Seizures: No; Stroke: Yes - Father; Thyroid Problems: No; Tuberculosis: No; Never smoker; Marital Status - Divorced; Alcohol Use: Moderate; Drug Use: Prior History; Caffeine Use: Moderate; Advanced Directives: No; Patient does not want information on Advanced Directives; Living Will: No; Medical Power of Attorney: No ELDIN, BONSELL ([id number]) Electronic Signature(s) Signed: 02/18/2017 5:15:14 PM By: Elliot Gurney, BSN, RN, CWS, Kim RN, BSN Signed: 02/19/2017 9:34:20 AM By: Lenda Kelp PA-C Entered By: Elliot Gurney, BSN, RN, CWS, Kim on  02/18/2017 13:42:32 Chaddock, [name] ([id number]) -------------------------------------------------------------------------------- SuperBill Details Patient Name: Anacker, Gaven C. Date of Service: 02/18/2017 Medical Record Number: [id number] Patient Account Number: [id number] Date of Birth/Sex: [birth year] (57 y.o. Male) Treating RN: Huel Coventry Primary Care Provider: Marden Noble Other Clinician: Referring Provider: Marden Noble Treating Provider/Extender: Linwood Dibbles, HOYT Weeks in Treatment: 0 Diagnosis Coding ICD-10 Codes Code Description E11.621 Type 2 diabetes mellitus with foot ulcer L97.512 Non-pressure chronic ulcer of other part of right foot with fat layer exposed E66.01 Morbid (severe) obesity due to excess calories I10 Essential (primary) hypertension E11.40 Type 2 diabetes mellitus with diabetic neuropathy, unspecified Facility Procedures CPT4 Code: 62952841 Description: 99213 - WOUND CARE VISIT-LEV 3 EST PT Modifier: Quantity: 1 CPT4 Code: 32440102 Description: 11042 - DEB SUBQ TISSUE 20 SQ CM/< ICD-10 Diagnosis Description L97.512 Non-pressure chronic ulcer of other part of right foot with fat Modifier: layer exposed Quantity: 1 Physician Procedures CPT4 Code: 7253664 Description: 99204 - WC PHYS LEVEL 4 - NEW PT ICD-10 Diagnosis Description E11.621 Type 2 diabetes mellitus with foot ulcer L97.512 Non-pressure chronic ulcer of other part of right foot with fat E66.01 Morbid (severe) obesity due to excess calories I10  Essential (primary) hypertension Modifier: 25 layer exposed Quantity: 1 CPT4 Code: 4034742 Description: 11042 - WC PHYS SUBQ TISS 20 SQ CM ICD-10 Diagnosis Description L97.512 Non-pressure chronic ulcer of other part of right foot with fat Modifier: layer exposed Quantity: 1 Electronic Signature(s) Signed: 02/19/2017 9:34:20 AM By: Lenda Kelp PA-C Entered By: Lenda Kelp on 02/19/2017 09:33:40

## 2017-02-19 NOTE — Progress Notes (Addendum)
James, Ross ([id number]) Visit Report for 02/18/2017 Allergy List Details Patient Name: [name], [name]. Date of Service: 02/18/2017 1:30 PM Medical Record Number: [id number] Patient Account Number: [id number] Date of Birth/Sex: 06/10/1960 (57 y.o. Male) Treating RN: Huel Coventry Primary Care Khambrel Amsden: Marden Noble Other Clinician: Referring Malisa Ruggiero: Marden Noble Treating Bular Hickok/Extender: Linwood Dibbles, HOYT Weeks in Treatment: 0 Allergies Active Allergies No Known Drug Allergies Allergy Notes Electronic Signature(s) Signed: 02/18/2017 5:15:14 PM By: Elliot Gurney, BSN, RN, CWS, Kim RN, BSN Entered By: Elliot Gurney, BSN, RN, CWS, Kim on 02/18/2017 13:34:00 Dinkel, Enzo Montgomery ([id number]) -------------------------------------------------------------------------------- Arrival Information Details Patient Name: [name], [name] Date of Service: 02/18/2017 1:30 PM Medical Record Number: [id number] Patient Account Number: [id number] Date of Birth/Sex: 08-29-1960 (57 y.o. Male) Treating RN: Huel Coventry Primary Care Sylvanus Telford: Marden Noble Other Clinician: Referring Aarian Griffie: Marden Noble Treating Nayef College/Extender: Linwood Dibbles, HOYT Weeks in Treatment: 0 Visit Information Patient Arrived: Ambulatory Arrival Time: 13:30 Accompanied By: self Transfer Assistance: None Patient Identification Verified: Yes Secondary Verification Process Yes Completed: Patient Requires Transmission-Based No Precautions: Patient Has Alerts: Yes Patient Alerts: Patient on Blood Thinner Type II Diabetic 81mg  aspirin Electronic Signature(s) Signed: 02/18/2017 5:15:14 PM By: Elliot Gurney, BSN, RN, CWS, Kim RN, BSN Entered By: Elliot Gurney, BSN, RN, CWS, Kim on 02/18/2017 13:31:39 Michon, Enzo Montgomery ([id number]) -------------------------------------------------------------------------------- Clinic Level of Care Assessment Details Patient Name: [name], [name] Date of Service: 02/18/2017 1:30 PM Medical Record Number: [id number] Patient  Account Number: [id number] Date of Birth/Sex: 1961/02/07 (57 y.o. Male) Treating RN: Huel Coventry Primary Care Patrina Andreas: Marden Noble Other Clinician: Referring Shuayb Schepers: Marden Noble Treating Beza Steppe/Extender: Linwood Dibbles, HOYT Weeks in Treatment: 0 Clinic Level of Care Assessment Items TOOL 1 Quantity Score []  - Use when EandM and Procedure is performed on INITIAL visit 0 ASSESSMENTS - Nursing Assessment / Reassessment X - General Physical Exam (combine w/ comprehensive assessment (listed just below) when 1 20 performed on new pt. evals) X- 1 25 Comprehensive Assessment (HX, ROS, Risk Assessments, Wounds Hx, etc.) ASSESSMENTS - Wound and Skin Assessment / Reassessment []  - Dermatologic / Skin Assessment (not related to wound area) 0 ASSESSMENTS - Ostomy and/or Continence Assessment and Care []  - Incontinence Assessment and Management 0 []  - 0 Ostomy Care Assessment and Management (repouching, etc.) PROCESS - Coordination of Care X - Simple Patient / Family Education for ongoing care 1 15 []  - 0 Complex (extensive) Patient / Family Education for ongoing care X- 1 10 Staff obtains Chiropractor, Records, Test Results / Process Orders []  - 0 Staff telephones HHA, Nursing Homes / Clarify orders / etc []  - 0 Routine Transfer to another Facility (non-emergent condition) []  - 0 Routine Hospital Admission (non-emergent condition) X- 1 15 New Admissions / Manufacturing engineer / Ordering NPWT, Apligraf, etc. []  - 0 Emergency Hospital Admission (emergent condition) PROCESS - Special Needs []  - Pediatric / Minor Patient Management 0 []  - 0 Isolation Patient Management []  - 0 Hearing / Language / Visual special needs []  - 0 Assessment of Community assistance (transportation, D/C planning, etc.) []  - 0 Additional assistance / Altered mentation []  - 0 Support Surface(s) Assessment (bed, cushion, seat, etc.) Borski, Prospero C. ([id number]) INTERVENTIONS - Miscellaneous []  - External  ear exam 0 []  - 0 Patient Transfer (multiple staff / Nurse, adult / Similar devices) []  - 0 Simple Staple / Suture removal (25 or less) []  - 0 Complex Staple / Suture removal (26 or more) []  - 0 Hypo/Hyperglycemic Management (do not check if billed separately) X- 1 15  Ankle / Brachial Index (ABI) - do not check if billed separately Has the patient been seen at the hospital within the last three years: Yes Total Score: 100 Level Of Care: New/Established - Level 3 Electronic Signature(s) Signed: 02/18/2017 5:15:14 PM By: Elliot Gurney, BSN, RN, CWS, Kim RN, BSN Entered By: Elliot Gurney, BSN, RN, CWS, Kim on 02/18/2017 16:34:06 Cabacungan, Enzo Montgomery ([id number]) -------------------------------------------------------------------------------- Encounter Discharge Information Details Patient Name: [name], [name] Date of Service: 02/18/2017 1:30 PM Medical Record Number: [id number] Patient Account Number: [id number] Date of Birth/Sex: [birth year] (57 y.o. Male) Treating RN: Huel Coventry Primary Care Koryn Charlot: Marden Noble Other Clinician: Referring Jeniel Slauson: Marden Noble Treating Andreana Klingerman/Extender: Linwood Dibbles, HOYT Weeks in Treatment: 0 Encounter Discharge Information Items Discharge Pain Level: 0 Discharge Condition: Stable Ambulatory Status: Ambulatory Discharge Destination: Home Transportation: Private Auto Accompanied By: self Schedule Follow-up Appointment: Yes Medication Reconciliation completed and Yes provided to Patient/Care Mohini Heathcock: Provided on Clinical Summary of Care: 02/18/2017 Form Type Recipient Paper Patient [facility] Electronic Signature(s) Signed: 02/18/2017 4:49:09 PM By: Elliot Gurney, BSN, RN, CWS, Kim RN, BSN Entered By: Elliot Gurney, BSN, RN, CWS, Kim on 02/18/2017 16:49:09 Malicki, Enzo Montgomery ([id number]) -------------------------------------------------------------------------------- Lower Extremity Assessment Details Patient Name: [name], [name] Date of Service: 02/18/2017 1:30 PM Medical Record  Number: [id number] Patient Account Number: [id number] Date of Birth/Sex: 08-Nov-1960 (57 y.o. Male) Treating RN: Huel Coventry Primary Care Judah Carchi: Marden Noble Other Clinician: Referring Alexiah Koroma: Marden Noble Treating Juanna Pudlo/Extender: Linwood Dibbles, HOYT Weeks in Treatment: 0 Vascular Assessment Pulses: Dorsalis Pedis Palpable: [Right:Yes] Doppler Audible: [Right:Yes] Posterior Tibial Palpable: [Right:Yes] Doppler Audible: [Right:Yes] Extremity colors, hair growth, and conditions: Extremity Color: [Right:Normal] Hair Growth on Extremity: [Right:Yes] Temperature of Extremity: [Right:Warm] Capillary Refill: [Right:< 3 seconds] Toe Nail Assessment Left: Right: Thick: No Discolored: Yes Deformed: Yes Improper Length and Hygiene: Yes Notes ABI non-compressible >220 Electronic Signature(s) Signed: 02/18/2017 5:15:14 PM By: Elliot Gurney, BSN, RN, CWS, Kim RN, BSN Entered By: Elliot Gurney, BSN, RN, CWS, Kim on 02/18/2017 14:01:13 Clauss, Enzo Montgomery ([id number]) -------------------------------------------------------------------------------- Multi Wound Chart Details Patient Name: Spicer, Deontrae C. Date of Service: 02/18/2017 1:30 PM Medical Record Number: [id number] Patient Account Number: [id number] Date of Birth/Sex: Dec 21, 1960 (57 y.o. Male) Treating RN: Huel Coventry Primary Care Gabriellah Rabel: Marden Noble Other Clinician: Referring Sari Cogan: Marden Noble Treating Delanee Xin/Extender: Linwood Dibbles, HOYT Weeks in Treatment: 0 Vital Signs Height(in): 71 Pulse(bpm): 92 Weight(lbs): 318.4 Blood Pressure(mmHg): 163/99 Body Mass Index(BMI): 44 Temperature(F): 97.8 Respiratory Rate 16 (breaths/min): Photos: [N/A:N/A] Wound Location: Right Toe Great - Plantar N/A N/A Wounding Event: Gradually Appeared N/A N/A Primary Etiology: Diabetic Wound/Ulcer of the N/A N/A Lower Extremity Secondary Etiology: Pressure Ulcer N/A N/A Comorbid History: Sleep Apnea, Hypertension, N/A N/A Myocardial Infarction, Type  II Diabetes, Gout, Osteoarthritis, Neuropathy Date Acquired: 09/12/2016 N/A N/A Weeks of Treatment: 0 N/A N/A Wound Status: Open N/A N/A Measurements L x W x D 0.5x1x0.1 N/A N/A (cm) Area (cm) : 0.393 N/A N/A Volume (cm) : 0.039 N/A N/A Classification: Grade 2 N/A N/A Exudate Amount: Medium N/A N/A Exudate Type: Serous N/A N/A Exudate Color: amber N/A N/A Wound Margin: Well defined, not attached N/A N/A Granulation Amount: Medium (34-66%) N/A N/A Granulation Quality: Red N/A N/A Necrotic Amount: None Present (0%) N/A N/A Exposed Structures: Fat Layer (Subcutaneous N/A N/A Tissue) Exposed: Yes Fascia: No Tendon: No Muscle: No Joint: No Bone: No Langford, Rodger C. ([id number]) Epithelialization: None N/A N/A Periwound Skin Texture: Callus: Yes N/A N/A Periwound Skin Moisture: No Abnormalities Noted N/A N/A Periwound Skin Color: No Abnormalities Noted N/A N/A  Tenderness on Palpation: No N/A N/A Wound Preparation: Ulcer Cleansing: N/A N/A Rinsed/Irrigated with Saline Topical Anesthetic Applied: Other: liocaine 4% Treatment Notes Electronic Signature(s) Signed: 02/18/2017 5:15:14 PM By: Elliot GurneyWoody, BSN, RN, CWS, Kim RN, BSN Entered By: Elliot GurneyWoody, BSN, RN, CWS, Kim on 02/18/2017 14:36:18 Macbride, Enzo MontgomeryOBERT C. ([id number]) -------------------------------------------------------------------------------- Multi-Disciplinary Care Plan Details Patient Name: Basher, Bethany C. Date of Service: 02/18/2017 1:30 PM Medical Record Number: [id number] Patient Account Number: [id number] Date of Birth/Sex: Jul 10, 1960 [age] y.o. Male) Treating RN: Huel CoventryWoody, Kim Primary Care Aashir Umholtz: Marden NobleGATES, Arihant Other Clinician: Referring Mabell Esguerra: Marden NobleGATES, Nikolaus Treating Kjell Brannen/Extender: Linwood DibblesSTONE III, HOYT Weeks in Treatment: 0 Active Inactive ` Nutrition Nursing Diagnoses: Potential for alteratiion in Nutrition/Potential for imbalanced nutrition Goals: Patient/caregiver will maintain therapeutic glucose control Date  Initiated: 02/18/2017 Target Resolution Date: 03/11/2017 Goal Status: Active Interventions: Assess patient nutrition upon admission and as needed per policy Treatment Activities: Patient referred to Primary Care Physician for further nutritional evaluation : 02/18/2017 Notes: ` Orientation to the Wound Care Program Nursing Diagnoses: Knowledge deficit related to the wound healing center program Goals: Patient/caregiver will verbalize understanding of the Wound Healing Center Program Date Initiated: 02/18/2017 Target Resolution Date: 03/11/2017 Goal Status: Active Interventions: Provide education on orientation to the wound center Notes: ` Peripheral Neuropathy Nursing Diagnoses: Knowledge deficit related to disease process and management of peripheral neurovascular dysfunction Goals: Patient/caregiver will verbalize understanding of disease process and disease management Date Initiated: 02/18/2017 Target Resolution Date: 03/11/2017 Prince RomeCROMER, Maddex C. ([id number]) Goal Status: Active Interventions: Assess signs and symptoms of neuropathy upon admission and as needed Provide education on Management of Neuropathy upon discharge from the Wound Center Treatment Activities: Patient referred for customized footwear/offloading : 02/18/2017 Notes: ` Pressure Nursing Diagnoses: Knowledge deficit related to management of pressures ulcers Goals: Patient/caregiver will verbalize understanding of pressure ulcer management Date Initiated: 02/18/2017 Target Resolution Date: 03/11/2017 Goal Status: Active Interventions: Provide education on pressure ulcers Treatment Activities: Pressure reduction/relief device ordered : 02/18/2017 Notes: ` Wound/Skin Impairment Nursing Diagnoses: Impaired tissue integrity Goals: Ulcer/skin breakdown will have a volume reduction of 80% by week 12 Date Initiated: 02/18/2017 Target Resolution Date: 06/11/2017 Goal Status: Active Interventions: Assess ulceration(s)  every visit Treatment Activities: Skin care regimen initiated : 02/18/2017 Notes: Electronic Signature(s) Signed: 02/18/2017 5:15:14 PM By: Elliot GurneyWoody, BSN, RN, CWS, Kim RN, BSN Entered By: Elliot GurneyWoody, BSN, RN, CWS, Kim on 02/18/2017 14:36:01 Molden, Enzo MontgomeryOBERT C. ([id number]) -------------------------------------------------------------------------------- Pain Assessment Details Patient Name: Koppen, [place] C. Date of Service: 02/18/2017 1:30 PM Medical Record Number: [id number] Patient Account Number: [id number] Date of Birth/Sex: Jul 10, 1960 [age] y.o. Male) Treating RN: Huel CoventryWoody, Kim Primary Care Janye Maynor: Marden NobleGATES, Enio Other Clinician: Referring Kawon Willcutt: Marden NobleGATES, Zakhi Treating Tharon Kitch/Extender: Linwood DibblesSTONE III, HOYT Weeks in Treatment: 0 Active Problems Location of Pain Severity and Description of Pain Patient Has Paino No Site Locations With Dressing Change: No Pain Management and Medication Current Pain Management: Goals for Pain Management Topical or injectable lidocaine is offered to patient for acute pain when surgical debridement is performed. If needed, Patient is instructed to use over the counter pain medication for the following 24-48 hours after debridement. Wound care MDs do not prescribed pain medications. Patient has chronic pain or uncontrolled pain. Patient has been instructed to make an appointment with their Primary Care Physician for pain management. Electronic Signature(s) Signed: 02/18/2017 5:15:14 PM By: Elliot GurneyWoody, BSN, RN, CWS, Kim RN, BSN Entered By: Elliot GurneyWoody, BSN, RN, CWS, Kim on 02/18/2017 13:31:47 Kunzman, Enzo MontgomeryOBERT C. ([id number]) -------------------------------------------------------------------------------- Patient/Caregiver Education Details Patient Name: Norgard, Enzo MontgomeryOBERT C. Date of Service:  02/18/2017 1:30 PM Medical Record Number: [id number] Patient Account Number: [id number] Date of Birth/Gender: 07/08/1960 (57 y.o. Male) Treating RN: Huel Coventry Primary Care Physician: Marden Noble  Other Clinician: Referring Physician: Marden Noble Treating Physician/Extender: Skeet Simmer in Treatment: 0 Education Assessment Education Provided To: Patient Education Topics Provided Peripheral Neuropathy: Handouts: Diabetes, General Foot Care, Neuropathy Methods: Demonstration, Explain/Verbal Responses: State content correctly Pressure: Handouts: Pressure Ulcers: Care and Offloading Methods: Demonstration, Explain/Verbal Responses: State content correctly Welcome To The Wound Care Center: Handouts: Welcome To The Wound Care Center Methods: Demonstration, Explain/Verbal Responses: State content correctly Wound Debridement: Handouts: Wound Debridement Methods: Demonstration, Explain/Verbal Responses: State content correctly Electronic Signature(s) Signed: 02/18/2017 5:15:14 PM By: Elliot Gurney, BSN, RN, CWS, Kim RN, BSN Entered By: Elliot Gurney, BSN, RN, CWS, Kim on 02/18/2017 16:49:51 Easterday, Enzo Montgomery ([id number]) -------------------------------------------------------------------------------- Wound Assessment Details Patient Name: Samudio, Nichalos C. Date of Service: 02/18/2017 1:30 PM Medical Record Number: [id number] Patient Account Number: [id number] Date of Birth/Sex: Aug 09, 1960 (57 y.o. Male) Treating RN: Huel Coventry Primary Care Kortez Murtagh: Marden Noble Other Clinician: Referring Deshawna Mcneece: Marden Noble Treating Tryphena Perkovich/Extender: Linwood Dibbles, HOYT Weeks in Treatment: 0 Wound Status Wound Number: 1 Primary Diabetic Wound/Ulcer of the Lower Extremity Etiology: Wound Location: Right Toe Great - Plantar Secondary Pressure Ulcer Wounding Event: Gradually Appeared Etiology: Date Acquired: 09/12/2016 Wound Open Weeks Of Treatment: 0 Status: Clustered Wound: No Comorbid Sleep Apnea, Hypertension, Myocardial History: Infarction, Type II Diabetes, Gout, Osteoarthritis, Neuropathy Photos Wound Measurements Length: (cm) 0.5 % Reduction Width: (cm) 1 % Reduction Depth: (cm)  0.1 Epithelializ Area: (cm) 0.393 Tunneling: Volume: (cm) 0.039 Undermining in Area: in Volume: ation: None No : No Wound Description Classification: Grade 2 Foul Odor Af Wound Margin: Well defined, not attached Slough/Fibri Exudate Amount: Medium Exudate Type: Serous Exudate Color: amber ter Cleansing: No no No Wound Bed Granulation Amount: Medium (34-66%) Exposed Structure Granulation Quality: Red Fascia Exposed: No Necrotic Amount: None Present (0%) Fat Layer (Subcutaneous Tissue) Exposed: Yes Tendon Exposed: No Muscle Exposed: No Joint Exposed: No Bone Exposed: No Periwound Skin Texture Texture Color Downie, Nowell C. ([id number]) No Abnormalities Noted: No No Abnormalities Noted: No Callus: Yes Moisture No Abnormalities Noted: No Wound Preparation Ulcer Cleansing: Rinsed/Irrigated with Saline Topical Anesthetic Applied: Other: liocaine 4%, Treatment Notes Wound #1 (Right, Plantar Toe Great) 1. Cleansed with: Clean wound with Normal Saline 2. Anesthetic Topical Lidocaine 4% cream to wound bed prior to debridement 5. Secondary Dressing Applied Kerlix/Conform 7. Secured with Tape Notes silvercell, offloading felt Electronic Signature(s) Signed: 02/18/2017 5:15:14 PM By: Elliot Gurney, BSN, RN, CWS, Kim RN, BSN Entered By: Elliot Gurney, BSN, RN, CWS, Kim on 02/18/2017 13:58:10 Nakata, Enzo Montgomery ([id number]) -------------------------------------------------------------------------------- Vitals Details Patient Name: Games, Ahmaud C. Date of Service: 02/18/2017 1:30 PM Medical Record Number: [id number] Patient Account Number: [id number] Date of Birth/Sex: [birth year] (57 y.o. Male) Treating RN: Huel Coventry Primary Care Johnel Yielding: Marden Noble Other Clinician: Referring Tico Crotteau: Marden Noble Treating Achsah Mcquade/Extender: Linwood Dibbles, HOYT Weeks in Treatment: 0 Vital Signs Time Taken: 13:31 Temperature (F): 97.8 Height (in): 71 Pulse (bpm): 92 Source:  Measured Respiratory Rate (breaths/min): 16 Weight (lbs): 318.4 Blood Pressure (mmHg): 163/99 Source: Measured Reference Range: 80 - 120 mg / dl Body Mass Index (BMI): 44.4 Electronic Signature(s) Signed: 02/18/2017 5:15:14 PM By: Elliot Gurney, BSN, RN, CWS, Kim RN, BSN Entered By: Elliot Gurney, BSN, RN, CWS, Kim on 02/18/2017 13:33:35

## 2017-02-20 DIAGNOSIS — L97519 Non-pressure chronic ulcer of other part of right foot with unspecified severity: Secondary | ICD-10-CM | POA: Diagnosis not present

## 2017-02-27 DIAGNOSIS — E119 Type 2 diabetes mellitus without complications: Secondary | ICD-10-CM | POA: Diagnosis not present

## 2017-02-27 DIAGNOSIS — L089 Local infection of the skin and subcutaneous tissue, unspecified: Secondary | ICD-10-CM | POA: Diagnosis not present

## 2017-03-04 ENCOUNTER — Encounter: Payer: BLUE CROSS/BLUE SHIELD | Admitting: Physician Assistant

## 2017-03-04 DIAGNOSIS — I252 Old myocardial infarction: Secondary | ICD-10-CM | POA: Diagnosis not present

## 2017-03-04 DIAGNOSIS — M1990 Unspecified osteoarthritis, unspecified site: Secondary | ICD-10-CM | POA: Diagnosis not present

## 2017-03-04 DIAGNOSIS — I10 Essential (primary) hypertension: Secondary | ICD-10-CM | POA: Diagnosis not present

## 2017-03-04 DIAGNOSIS — E11621 Type 2 diabetes mellitus with foot ulcer: Secondary | ICD-10-CM | POA: Diagnosis not present

## 2017-03-04 DIAGNOSIS — E1140 Type 2 diabetes mellitus with diabetic neuropathy, unspecified: Secondary | ICD-10-CM | POA: Diagnosis not present

## 2017-03-04 DIAGNOSIS — L97512 Non-pressure chronic ulcer of other part of right foot with fat layer exposed: Secondary | ICD-10-CM | POA: Diagnosis not present

## 2017-03-04 DIAGNOSIS — M109 Gout, unspecified: Secondary | ICD-10-CM | POA: Diagnosis not present

## 2017-03-04 DIAGNOSIS — G4730 Sleep apnea, unspecified: Secondary | ICD-10-CM | POA: Diagnosis not present

## 2017-03-04 DIAGNOSIS — Z6841 Body Mass Index (BMI) 40.0 and over, adult: Secondary | ICD-10-CM | POA: Diagnosis not present

## 2017-03-05 ENCOUNTER — Ambulatory Visit: Payer: No Typology Code available for payment source | Admitting: Podiatry

## 2017-03-05 NOTE — Progress Notes (Signed)
HELTON, OLESON ([id number]) Visit Report for 03/04/2017 Arrival Information Details Patient Name: [name], [name]. Date of Service: 03/04/2017 8:00 AM Medical Record Number: [id number] Patient Account Number: [id number] Date of Birth/Sex: Mar 21, 1960 (57 y.o. Male) Treating RN: Phillis Haggis Primary Care Rupa Lagan: Marden Noble Other Clinician: Referring Makayleigh Poliquin: Marden Noble Treating Laird Runnion/Extender: Linwood Dibbles, HOYT Weeks in Treatment: 2 Visit Information History Since Last Visit All ordered tests and consults were completed: No Patient Arrived: Ambulatory Added or deleted any medications: No Arrival Time: 08:09 Any new allergies or adverse reactions: No Accompanied By: self Had a fall or experienced change in No Transfer Assistance: None activities of daily living that may affect Patient Identification Verified: Yes risk of falls: Secondary Verification Process Yes Signs or symptoms of abuse/neglect since last visito No Completed: Hospitalized since last visit: No Patient Requires Transmission-Based No Has Dressing in Place as Prescribed: Yes Precautions: Pain Present Now: No Patient Has Alerts: Yes Patient Alerts: Patient on Blood Thinner Type II Diabetic 81mg  aspirin Electronic Signature(s) Signed: 03/05/2017 9:12:55 AM By: Alejandro Mulling Entered By: Alejandro Mulling on 03/04/2017 08:09:18 [name], [name] ([id number]) -------------------------------------------------------------------------------- Encounter Discharge Information Details Patient Name: [name], [name] Date of Service: 03/04/2017 8:00 AM Medical Record Number: [id number] Patient Account Number: [id number] Date of Birth/Sex: May 24, 1960 (57 y.o. Male) Treating RN: Phillis Haggis Primary Care Jazmeen Axtell: Marden Noble Other Clinician: Referring Clydette Privitera: Marden Noble Treating Keymani Mclean/Extender: Linwood Dibbles, HOYT Weeks in Treatment: 2 Encounter Discharge Information Items Discharge Pain Level:  0 Discharge Condition: Stable Ambulatory Status: Ambulatory Discharge Destination: Home Transportation: Private Auto Accompanied By: self Schedule Follow-up Appointment: Yes Medication Reconciliation completed and No provided to Patient/Care Ailea Rhatigan: Provided on Clinical Summary of Care: 03/04/2017 Form Type Recipient Paper Patient [facility] Electronic Signature(s) Signed: 03/05/2017 8:11:36 AM By: Gwenlyn Perking Entered By: Gwenlyn Perking on 03/04/2017 08:44:41 Karstens, [name] ([id number]) -------------------------------------------------------------------------------- Lower Extremity Assessment Details Patient Name: [name], [name] Date of Service: 03/04/2017 8:00 AM Medical Record Number: [id number] Patient Account Number: [id number] Date of Birth/Sex: December 05, 1960 (57 y.o. Male) Treating RN: Phillis Haggis Primary Care Gaylord Seydel: Marden Noble Other Clinician: Referring Zonia Caplin: Marden Noble Treating Fusae Florio/Extender: Linwood Dibbles, HOYT Weeks in Treatment: 2 Vascular Assessment Pulses: Dorsalis Pedis Palpable: [Right:Yes] Posterior Tibial Extremity colors, hair growth, and conditions: Extremity Color: [Right:Normal] Temperature of Extremity: [Right:Warm] Capillary Refill: [Right:< 3 seconds] Toe Nail Assessment Left: Right: Thick: No Discolored: Yes Deformed: Yes Improper Length and Hygiene: Yes Electronic Signature(s) Signed: 03/05/2017 9:12:55 AM By: Alejandro Mulling Entered By: Alejandro Mulling on 03/04/2017 08:17:08 [name], [name] ([id number]) -------------------------------------------------------------------------------- Multi Wound Chart Details Patient Name: [name], [name] Date of Service: 03/04/2017 8:00 AM Medical Record Number: [id number] Patient Account Number: [id number] Date of Birth/Sex: [birth year] (57 y.o. Male) Treating RN: Ashok Cordia, Debi Primary Care Cap Massi: Marden Noble Other Clinician: Referring Brylee Mcgreal: Marden Noble Treating  Rema Lievanos/Extender: Linwood Dibbles, HOYT Weeks in Treatment: 2 Vital Signs Height(in): 71 Pulse(bpm): 93 Weight(lbs): 318.4 Blood Pressure(mmHg): 162/88 Body Mass Index(BMI): 44 Temperature(F): 98.2 Respiratory Rate 18 (breaths/min): Photos: [1:No Photos] [N/A:N/A] Wound Location: [1:Right Toe Great - Plantar] [N/A:N/A] Wounding Event: [1:Gradually Appeared] [N/A:N/A] Primary Etiology: [1:Diabetic Wound/Ulcer of the Lower Extremity] [N/A:N/A] Secondary Etiology: [1:Pressure Ulcer] [N/A:N/A] Comorbid History: [1:Sleep Apnea, Hypertension, Myocardial Infarction, Type II Diabetes, Gout, Osteoarthritis, Neuropathy] [N/A:N/A] Date Acquired: [1:09/12/2016] [N/A:N/A] Weeks of Treatment: [1:2] [N/A:N/A] Wound Status: [1:Open] [N/A:N/A] Measurements L x W x D [1:0.2x0.5x0.3] [N/A:N/A] (cm) Area (cm) : [1:0.079] [N/A:N/A] Volume (cm) : [1:0.024] [N/A:N/A] % Reduction in Area: [1:79.90%] [N/A:N/A] % Reduction in Volume: [1:38.50%] [  N/A:N/A] Starting Position 1 [1:12] (o'clock): Ending Position 1 [1:12] (o'clock): Maximum Distance 1 (cm): [1:0.3] Undermining: [1:Yes] [N/A:N/A] Classification: [1:Grade 2] [N/A:N/A] Exudate Amount: [1:Medium] [N/A:N/A] Exudate Type: [1:Serosanguineous] [N/A:N/A] Exudate Color: [1:red, brown] [N/A:N/A] Wound Margin: [1:Well defined, not attached] [N/A:N/A] Granulation Amount: [1:Medium (34-66%)] [N/A:N/A] Granulation Quality: [1:Red] [N/A:N/A] Necrotic Amount: [1:None Present (0%)] [N/A:N/A] Exposed Structures: [1:Fat Layer (Subcutaneous Tissue) Exposed: Yes Fascia: No Tendon: No] [N/A:N/A] Muscle: No Joint: No Bone: No Epithelialization: None N/A N/A Periwound Skin Texture: Callus: Yes N/A N/A Periwound Skin Moisture: No Abnormalities Noted N/A N/A Periwound Skin Color: No Abnormalities Noted N/A N/A Temperature: No Abnormality N/A N/A Tenderness on Palpation: No N/A N/A Wound Preparation: Ulcer Cleansing: N/A N/A Rinsed/Irrigated with  Saline Topical Anesthetic Applied: Other: liocaine 4% Treatment Notes Electronic Signature(s) Signed: 03/05/2017 9:12:55 AM By: Alejandro Mulling Entered By: Alejandro Mulling on 03/04/2017 08:19:14 Bossler, [name] ([id number]) -------------------------------------------------------------------------------- Multi-Disciplinary Care Plan Details Patient Name: [name], [name] Date of Service: 03/04/2017 8:00 AM Medical Record Number: [id number] Patient Account Number: [id number] Date of Birth/Sex: Jul 13, 1960 (57 y.o. Male) Treating RN: Phillis Haggis Primary Care Lenix Kidd: Marden Noble Other Clinician: Referring Davison Ohms: Marden Noble Treating Leanza Shepperson/Extender: Linwood Dibbles, HOYT Weeks in Treatment: 2 Active Inactive ` Nutrition Nursing Diagnoses: Potential for alteratiion in Nutrition/Potential for imbalanced nutrition Goals: Patient/caregiver will maintain therapeutic glucose control Date Initiated: 02/18/2017 Target Resolution Date: 03/11/2017 Goal Status: Active Interventions: Assess patient nutrition upon admission and as needed per policy Treatment Activities: Patient referred to Primary Care Physician for further nutritional evaluation : 02/18/2017 Notes: ` Orientation to the Wound Care Program Nursing Diagnoses: Knowledge deficit related to the wound healing center program Goals: Patient/caregiver will verbalize understanding of the Wound Healing Center Program Date Initiated: 02/18/2017 Target Resolution Date: 03/11/2017 Goal Status: Active Interventions: Provide education on orientation to the wound center Notes: ` Peripheral Neuropathy Nursing Diagnoses: Knowledge deficit related to disease process and management of peripheral neurovascular dysfunction Goals: Patient/caregiver will verbalize understanding of disease process and disease management Date Initiated: 02/18/2017 Target Resolution Date: 03/11/2017 RILYN, UPSHAW ([id number]) Goal Status:  Active Interventions: Assess signs and symptoms of neuropathy upon admission and as needed Provide education on Management of Neuropathy upon discharge from the Wound Center Treatment Activities: Patient referred for customized footwear/offloading : 02/18/2017 Notes: ` Pressure Nursing Diagnoses: Knowledge deficit related to management of pressures ulcers Goals: Patient/caregiver will verbalize understanding of pressure ulcer management Date Initiated: 02/18/2017 Target Resolution Date: 03/11/2017 Goal Status: Active Interventions: Provide education on pressure ulcers Treatment Activities: Pressure reduction/relief device ordered : 02/18/2017 Notes: ` Wound/Skin Impairment Nursing Diagnoses: Impaired tissue integrity Goals: Ulcer/skin breakdown will have a volume reduction of 80% by week 12 Date Initiated: 02/18/2017 Target Resolution Date: 06/11/2017 Goal Status: Active Interventions: Assess ulceration(s) every visit Treatment Activities: Skin care regimen initiated : 02/18/2017 Notes: Electronic Signature(s) Signed: 03/05/2017 9:12:55 AM By: Alejandro Mulling Entered By: Alejandro Mulling on 03/04/2017 08:19:02 [name], [name] ([id number]) -------------------------------------------------------------------------------- Pain Assessment Details Patient Name: [name], [name] Date of Service: 03/04/2017 8:00 AM Medical Record Number: [id number] Patient Account Number: [id number] Date of Birth/Sex: 1961/02/09 (57 y.o. Male) Treating RN: Phillis Haggis Primary Care Brylin Stanislawski: Marden Noble Other Clinician: Referring Ahri Olson: Marden Noble Treating Karis Emig/Extender: Linwood Dibbles, HOYT Weeks in Treatment: 2 Active Problems Location of Pain Severity and Description of Pain Patient Has Paino No Site Locations Pain Management and Medication Current Pain Management: Electronic Signature(s) Signed: 03/05/2017 9:12:55 AM By: Alejandro Mulling Entered By: Alejandro Mulling on 03/04/2017  08:09:24 Winiecki, [name] ([id number]) --------------------------------------------------------------------------------  Patient/Caregiver Education Details Patient Name: [name], [name] Date of Service: 03/04/2017 8:00 AM Medical Record Number: [id number] Patient Account Number: [id number] Date of Birth/Gender: [birth year](56 y.o. Male) Treating RN: Phillis HaggisPinkerton, Debi Primary Care Physician: Marden NobleGATES, Verl Other Clinician: Referring Physician: Marden NobleGATES, Dyke Treating Physician/Extender: Skeet SimmerSTONE III, HOYT Weeks in Treatment: 2 Education Assessment Education Provided To: Patient Education Topics Provided Wound/Skin Impairment: Handouts: Caring for Your Ulcer, Other: change dressing as ordered Methods: Demonstration, Explain/Verbal Responses: State content correctly Electronic Signature(s) Signed: 03/05/2017 9:12:55 AM By: Alejandro MullingPinkerton, Debra Entered By: Alejandro MullingPinkerton, Debra on 03/04/2017 08:20:23 [name], [name] ([id number]) -------------------------------------------------------------------------------- Wound Assessment Details Patient Name: [name], [name] Date of Service: 03/04/2017 8:00 AM Medical Record Number: [id number] Patient Account Number: [id number] Date of Birth/Sex: [birth year] [age] y.o. Male) Treating RN: Phillis HaggisPinkerton, Debi Primary Care James Zalesky: Marden NobleGATES, Yanixan Other Clinician: Referring Chyrl Elwell: Marden NobleGATES, Baudelio Treating Yola Paradiso/Extender: STONE III, HOYT Weeks in Treatment: 2 Wound Status Wound Number: 1 Primary Diabetic Wound/Ulcer of the Lower Extremity Etiology: Wound Location: Right Toe Great - Plantar Secondary Pressure Ulcer Wounding Event: Gradually Appeared Etiology: Date Acquired: 09/12/2016 Wound Open Weeks Of Treatment: 2 Status: Clustered Wound: No Comorbid Sleep Apnea, Hypertension, Myocardial History: Infarction, Type II Diabetes, Gout, Osteoarthritis, Neuropathy Photos Photo Uploaded By: Alejandro MullingPinkerton, Debra on 03/04/2017 16:28:49 Wound Measurements Length: (cm)  0.2 Width: (cm) 0.5 Depth: (cm) 0.3 Area: (cm) 0.079 Volume: (cm) 0.024 % Reduction in Area: 79.9% % Reduction in Volume: 38.5% Epithelialization: None Tunneling: No Undermining: Yes Starting Position (o'clock): 12 Ending Position (o'clock): 12 Maximum Distance: (cm) 0.3 Wound Description Classification: Grade 2 Wound Margin: Well defined, not attached Exudate Amount: Medium Exudate Type: Serosanguineous Exudate Color: red, brown Foul Odor After Cleansing: No Slough/Fibrino No Wound Bed Granulation Amount: Medium (34-66%) Exposed Structure Granulation Quality: Red Fascia Exposed: No Necrotic Amount: None Present (0%) Fat Layer (Subcutaneous Tissue) Exposed: Yes Greener, Johnothan C. ([id number]) Tendon Exposed: No Muscle Exposed: No Joint Exposed: No Bone Exposed: No Periwound Skin Texture Texture Color No Abnormalities Noted: No No Abnormalities Noted: No Callus: Yes Temperature / Pain Moisture Temperature: No Abnormality No Abnormalities Noted: No Wound Preparation Ulcer Cleansing: Rinsed/Irrigated with Saline Topical Anesthetic Applied: Other: liocaine 4%, Treatment Notes Wound #1 (Right, Plantar Toe Great) 1. Cleansed with: Clean wound with Normal Saline 2. Anesthetic Topical Lidocaine 4% cream to wound bed prior to debridement 4. Dressing Applied: Other dressing (specify in notes) 5. Secondary Dressing Applied Dry Gauze Kerlix/Conform 6. Footwear/Offloading device applied Felt/Foam 7. Secured with Tape Notes edoform, offloading felt Electronic Signature(s) Signed: 03/05/2017 9:12:55 AM By: Alejandro MullingPinkerton, Debra Entered By: Alejandro MullingPinkerton, Debra on 03/04/2017 08:15:53 Dickmann, [name] ([id number]) -------------------------------------------------------------------------------- Vitals Details Patient Name: [name], [name] Date of Service: 03/04/2017 8:00 AM Medical Record Number: [id number] Patient Account Number: [id number] Date of Birth/Sex: [birth year] [age]  y.o. Male) Treating RN: Ashok CordiaPinkerton, Debi Primary Care Cambrea [name]: Marden NobleGATES, Abhimanyu Other Clinician: Referring Raif Chachere: Marden NobleGATES, Anselm Treating Darlene Brozowski/Extender: Linwood DibblesSTONE III, HOYT Weeks in Treatment: 2 Vital Signs Time Taken: 08:11 Temperature (F): 98.2 Height (in): 71 Pulse (bpm): 93 Weight (lbs): 318.4 Respiratory Rate (breaths/min): 18 Body Mass Index (BMI): 44.4 Blood Pressure (mmHg): 162/88 Reference Range: 80 - 120 mg / dl Notes Pt states that he saw his PCP on Friday and he gave him some new BP med and it was Losartan but he is unsure of the mg. Electronic Signature(s) Signed: 03/05/2017 9:12:55 AM By: Alejandro MullingPinkerton, Debra Entered By: Alejandro MullingPinkerton, Debra on 03/04/2017 08:12:26

## 2017-03-05 NOTE — Progress Notes (Signed)
James, Ross ([id number]) Visit Report for 03/04/2017 Chief Complaint Document Details Patient Name: [name], MURICE Ross. Date of Service: 03/04/2017 8:00 AM Medical Record Number: [id number] Patient Account Number: [id number] Date of Birth/Sex: 1960/04/28 (57 y.o. Male) Treating RN: Phillis Haggis Primary Care Provider: Marden Noble Other Clinician: Referring Provider: Marden Noble Treating Provider/Extender: Linwood Dibbles, HOYT Weeks in Treatment: 2 Information Obtained from: Patient Chief Complaint Right 1st Toe Plantar Ulcer Electronic Signature(s) Signed: 03/04/2017 4:26:52 PM By: Lenda Kelp PA-C Entered By: Lenda Kelp on 03/04/2017 08:18:44 Belinsky, Enzo Montgomery ([id number]) -------------------------------------------------------------------------------- Debridement Details Patient Name: James, [name] Date of Service: 03/04/2017 8:00 AM Medical Record Number: [id number] Patient Account Number: [id number] Date of Birth/Sex: 1960/05/02 (57 y.o. Male) Treating RN: Ashok Cordia, Debi Primary Care Provider: Marden Noble Other Clinician: Referring Provider: Marden Noble Treating Provider/Extender: Linwood Dibbles, HOYT Weeks in Treatment: 2 Debridement Performed for Wound #1 Right,Plantar Toe Great Assessment: Performed By: Physician STONE III, HOYT E., PA-C Debridement: Debridement Severity of Tissue Pre Fat layer exposed Debridement: Pre-procedure Verification/Time Yes - 08:21 Out Taken: Start Time: 08:22 Pain Control: Lidocaine 4% Topical Solution Level: Skin/Subcutaneous Tissue Total Area Debrided (L x W): 0.2 (cm) x 0.5 (cm) = 0.1 (cm) Tissue and other material Viable, Non-Viable, Callus, Exudate, Fibrin/Slough, Subcutaneous debrided: Instrument: Curette Bleeding: Minimum Hemostasis Achieved: Pressure End Time: 08:30 Procedural Pain: 0 Post Procedural Pain: 0 Response to Treatment: Procedure was tolerated well Post Debridement Measurements of Total  Wound Length: (cm) 0.3 Width: (cm) 0.6 Depth: (cm) 0.3 Volume: (cm) 0.042 Character of Wound/Ulcer Post Debridement: Requires Further Debridement Severity of Tissue Post Debridement: Fat layer exposed Post Procedure Diagnosis Same as Pre-procedure Electronic Signature(s) Signed: 03/04/2017 4:26:52 PM By: Lenda Kelp PA-C Signed: 03/05/2017 9:12:55 AM By: Alejandro Mulling Entered By: Alejandro Mulling on 03/04/2017 08:30:22 Yandell, Enzo Montgomery ([id number]) -------------------------------------------------------------------------------- HPI Details Patient Name: James, [name] Date of Service: 03/04/2017 8:00 AM Medical Record Number: [id number] Patient Account Number: [id number] Date of Birth/Sex: 1960/06/30 (57 y.o. Male) Treating RN: Phillis Haggis Primary Care Provider: Marden Noble Other Clinician: Referring Provider: Marden Noble Treating Provider/Extender: Linwood Dibbles, HOYT Weeks in Treatment: 2 History of Present Illness Associated Signs and Symptoms: Patient has a history of diabetes mellitus which is under okay control, obesity, bilateral lower extremity leg swelling, diabetic neuropathy, and hypertension HPI Description: 02/19/16 on evaluation today patient presents for initial evaluation concerning an ulcer on his right great toe which has been present for roughly 6 months to a year. He does not know exactly when he first noted this. Nonetheless he is diabetic and is a once he runs around 7.1 currently and so is under the okay control. He also is obese and has chronic bilateral lower extremity swelling. Patient also does have diabetic neuropathy unfortunately. For this reason he unfortunately did have an injury to his right great toe which he was initially unaware of and then since has been "doctoring on himself" unfortunately it has not seem to really improve though the good news is it does not seem to extend to deeply. He has not really seen too many people for this other  than his primary care provider at this point that [facility] physicians Dr. Kevan Ny. He was at one time put on antibiotics for this although it has been completed this was Septra but that was back in November 2018. Currently there is no evidence of infection. Patient does tell me that he attempted to work on some the callous himself but really does not have  a good angle to be able to see what is going on down there. He does have a significant amount of callous surrounding the wound bed. 03/04/17 on evaluation today patient presents for follow-up concerning his right great toe ulcer. He has been doing fairly well over the past two weeks since I initially saw him. He states that overall the appearance of the wound does seem to be better in his opinion. Fortunately he has no evidence of infection at this time. He has been mainly just using a dry gauze over the wound and then covering this with a dressing. He has no major concerns other than where we are gonna go after today as far as follow-up is concerned. Electronic Signature(s) Signed: 03/04/2017 4:26:52 PM By: Lenda Kelp PA-C Entered By: Lenda Kelp on 03/04/2017 08:36:17 Linhart, Enzo Montgomery ([id number]) -------------------------------------------------------------------------------- Physical Exam Details Patient Name: Ross, [name] Date of Service: 03/04/2017 8:00 AM Medical Record Number: [id number] Patient Account Number: [id number] Date of Birth/Sex: 01/17/1961 (57 y.o. Male) Treating RN: Phillis Haggis Primary Care Provider: Marden Noble Other Clinician: Referring Provider: Marden Noble Treating Provider/Extender: STONE III, HOYT Weeks in Treatment: 2 Constitutional Well-nourished and well-hydrated in no acute distress. Respiratory normal breathing without difficulty. Psychiatric this patient is able to make decisions and demonstrates good insight into disease process. Alert and Oriented x 3. pleasant and  cooperative. Notes Patient's wound bed shows evidence of significant callous still overlying the surface of the wound which is a small crack open centrally. Fortunately there does not appear to be any evidence of significant infection there is no erythema and other than the callous the periwound appears to be okay. I did debride the wound today including the callous surrounding in the periwound location tolerated this well without complication. Post debridement the wound bed itself appear to be doing much better in the callous was significantly improved in the perimeter of the wound. Electronic Signature(s) Signed: 03/04/2017 4:26:52 PM By: Lenda Kelp PA-C Entered By: Lenda Kelp on 03/04/2017 08:37:13 Fischl, Enzo Montgomery ([id number]) -------------------------------------------------------------------------------- Physician Orders Details Patient Name: Keehan, Maximiano C. Date of Service: 03/04/2017 8:00 AM Medical Record Number: [id number] Patient Account Number: [id number] Date of Birth/Sex: 1960-02-18 (57 y.o. Male) Treating RN: Ashok Cordia, Debi Primary Care Provider: Marden Noble Other Clinician: Referring Provider: Marden Noble Treating Provider/Extender: Linwood Dibbles, HOYT Weeks in Treatment: 2 Verbal / Phone Orders: Yes ClinicianAshok Cordia, Debi Read Back and Verified: Yes Diagnosis Coding ICD-10 Coding Code Description E11.621 Type 2 diabetes mellitus with foot ulcer L97.512 Non-pressure chronic ulcer of other part of right foot with fat layer exposed E66.01 Morbid (severe) obesity due to excess calories I10 Essential (primary) hypertension E11.40 Type 2 diabetes mellitus with diabetic neuropathy, unspecified Wound Cleansing Wound #1 Right,Plantar Toe Great o Clean wound with Normal Saline. o Cleanse wound with mild soap and water o May Shower, gently pat wound dry prior to applying new dressing. Anesthetic (add to Medication List) Wound #1 Right,Plantar Toe  Great o Topical Lidocaine 4% cream applied to wound bed prior to debridement (In Clinic Only). Primary Wound Dressing Wound #1 Right,Plantar Toe Great o Other: - endoform Secondary Dressing Wound #1 Right,Plantar Toe Great o Conform/Kerlix o Other - Felt Dressing Change Frequency Wound #1 Right,Plantar Toe Great o Change dressing every day. Follow-up Appointments Wound #1 Right,Plantar Toe Great o Return Appointment in 2 weeks. Edema Control Wound #1 Right,Plantar Toe Great o Elevate legs to the level of the heart and pump ankles as often as  possible Additional Orders / Instructions DEIGO, ALONSO. ([id number]) Wound #1 Right,Plantar Toe Great o Increase protein intake. o Activity as tolerated o Other: - Multivitamin Patient Medications Allergies: No Known Drug Allergies Notifications Medication Indication Start End lidocaine DOSE 1 - topical 4 % cream - 1 cream topical Electronic Signature(s) Signed: 03/04/2017 4:26:52 PM By: Lenda Kelp PA-C Signed: 03/05/2017 9:12:55 AM By: Alejandro Mulling Entered By: Alejandro Mulling on 03/04/2017 08:30:08 Currey, Enzo Montgomery ([id number]) -------------------------------------------------------------------------------- Prescription 03/04/2017 Patient Name: Nipp, [name] Provider: Lenda Kelp PA-C Date of Birth: [birth year] NPI#: [id number] Sex: Judie Petit DEA#: [id number] Phone #: [phone] License #: Patient Address: [facility] [address] [facility] [place], [place] [address] [address] [place], [place] [address] [phone] Allergies No Known Drug Allergies Medication Medication: Route: Strength: Form: lidocaine topical 4% cream Class: TOPICAL LOCAL ANESTHETICS Dose: Frequency / Time: Indication: 1 1 cream topical Number of Refills: Number of Units: 0 Generic Substitution: Start Date: End Date: Administered  at Substitution Permitted Facility: Yes Time Administered: Time Discontinued: Note to Pharmacy: Signature(s): Date(s): Electronic Signature(s) Signed: 03/04/2017 4:26:52 PM By: Lenda Kelp PA-C Signed: 03/05/2017 9:12:55 AM By: Alejandro Mulling Entered By: Alejandro Mulling on 03/04/2017 08:30:09 Krahl, LYN DEEMER ([id number]) Finstad, Enzo Montgomery ([id number]) --------------------------------------------------------------------------------  Problem List Details Patient Name: Thome, Jerremy C. Date of Service: 03/04/2017 8:00 AM Medical Record Number: [id number] Patient Account Number: [id number] Date of Birth/Sex: Oct 03, 1960 (57 y.o. Male) Treating RN: Phillis Haggis Primary Care Provider: Marden Noble Other Clinician: Referring Provider: Marden Noble Treating Provider/Extender: Linwood Dibbles, HOYT Weeks in Treatment: 2 Active Problems ICD-10 Encounter Code Description Active Date Diagnosis E11.621 Type 2 diabetes mellitus with foot ulcer 02/18/2017 Yes L97.512 Non-pressure chronic ulcer of other part of right foot with fat layer 02/18/2017 Yes exposed E66.01 Morbid (severe) obesity due to excess calories 02/18/2017 Yes I10 Essential (primary) hypertension 02/18/2017 Yes E11.40 Type 2 diabetes mellitus with diabetic neuropathy, unspecified 02/18/2017 Yes Inactive Problems Resolved Problems Electronic Signature(s) Signed: 03/04/2017 4:26:52 PM By: Lenda Kelp PA-C Entered By: Lenda Kelp on 03/04/2017 08:18:38 Maggard, Enzo Montgomery ([id number]) -------------------------------------------------------------------------------- Progress Note Details Patient Name: Gibb, Torrin C. Date of Service: 03/04/2017 8:00 AM Medical Record Number: [id number] Patient Account Number: [id number] Date of Birth/Sex: 09-21-1960 (57 y.o. Male) Treating RN: Phillis Haggis Primary Care Provider: Marden Noble Other Clinician: Referring Provider: Marden Noble Treating Provider/Extender: Linwood Dibbles,  HOYT Weeks in Treatment: 2 Subjective Chief Complaint Information obtained from Patient Right 1st Toe Plantar Ulcer History of Present Illness (HPI) The following HPI elements were documented for the patient's wound: Associated Signs and Symptoms: Patient has a history of diabetes mellitus which is under okay control, obesity, bilateral lower extremity leg swelling, diabetic neuropathy, and hypertension 02/19/16 on evaluation today patient presents for initial evaluation concerning an ulcer on his right great toe which has been present for roughly 6 months to a year. He does not know exactly when he first noted this. Nonetheless he is diabetic and is a once he runs around 7.1 currently and so is under the okay control. He also is obese and has chronic bilateral lower extremity swelling. Patient also does have diabetic neuropathy unfortunately. For this reason he unfortunately did have an injury to his right great toe which he was initially unaware of and then since has been "doctoring on himself" unfortunately it has not seem to really improve though the good news is it does not seem to extend to  deeply. He has not really seen too many people for this other than his primary care provider at this point that [facility] physicians Dr. Kevan Ny. He was at one time put on antibiotics for this although it has been completed this was Septra but that was back in November 2018. Currently there is no evidence of infection. Patient does tell me that he attempted to work on some the callous himself but really does not have a good angle to be able to see what is going on down there. He does have a significant amount of callous surrounding the wound bed. 03/04/17 on evaluation today patient presents for follow-up concerning his right great toe ulcer. He has been doing fairly well over the past two weeks since I initially saw him. He states that overall the appearance of the wound does seem to be better in his opinion.  Fortunately he has no evidence of infection at this time. He has been mainly just using a dry gauze over the wound and then covering this with a dressing. He has no major concerns other than where we are gonna go after today as far as follow-up is concerned. Patient History Information obtained from Patient. Family History Cancer - Mother, Diabetes - Father, Heart Disease - Father, Hypertension - Father, Stroke - Father, No family history of Kidney Disease, Lung Disease, Seizures, Thyroid Problems, Tuberculosis. Social History Never smoker, Marital Status - Divorced, Alcohol Use - Moderate, Drug Use - Prior History, Caffeine Use - Moderate. Review of Systems (ROS) Constitutional Symptoms (General Health) Denies complaints or symptoms of Fever, Chills. Respiratory The patient has no complaints or symptoms. Cardiovascular The patient has no complaints or symptoms. Psychiatric The patient has no complaints or symptoms. CALVIN, JABLONOWSKI ([id number]) Objective Constitutional Well-nourished and well-hydrated in no acute distress. Vitals Time Taken: 8:11 AM, Height: 71 in, Weight: 318.4 lbs, BMI: 44.4, Temperature: 98.2 F, Pulse: 93 bpm, Respiratory Rate: 18 breaths/min, Blood Pressure: 162/88 mmHg. General Notes: Pt states that he saw his PCP on Friday and he gave him some new BP med and it was Losartan but he is unsure of the mg. Respiratory normal breathing without difficulty. Psychiatric this patient is able to make decisions and demonstrates good insight into disease process. Alert and Oriented x 3. pleasant and cooperative. General Notes: Patient's wound bed shows evidence of significant callous still overlying the surface of the wound which is a small crack open centrally. Fortunately there does not appear to be any evidence of significant infection there is no erythema and other than the callous the periwound appears to be okay. I did debride the wound today including the callous  surrounding in the periwound location tolerated this well without complication. Post debridement the wound bed itself appear to be doing much better in the callous was significantly improved in the perimeter of the wound. Integumentary (Hair, Skin) Wound #1 status is Open. Original cause of wound was Gradually Appeared. The wound is located on the [employer]. The wound measures 0.2cm length x 0.5cm width x 0.3cm depth; 0.079cm^2 area and 0.024cm^3 volume. There is Fat Layer (Subcutaneous Tissue) Exposed exposed. There is no tunneling noted, however, there is undermining starting at 12:00 and ending at 12:00 with a maximum distance of 0.3cm. There is a medium amount of serosanguineous drainage noted. The wound margin is well defined and not attached to the wound base. There is medium (34-66%) red granulation within the wound bed. There is no necrotic tissue within the wound bed. The  periwound skin appearance exhibited: Callus. Periwound temperature was noted as No Abnormality. Assessment Active Problems ICD-10 E11.621 - Type 2 diabetes mellitus with foot ulcer L97.512 - Non-pressure chronic ulcer of other part of right foot with fat layer exposed E66.01 - Morbid (severe) obesity due to excess calories I10 - Essential (primary) hypertension E11.40 - Type 2 diabetes mellitus with diabetic neuropathy, unspecified Uplinger, Danzig C. ([id number]) Procedures Wound #1 Pre-procedure diagnosis of Wound #1 is a Diabetic Wound/Ulcer of the Lower Extremity located on the Right,Plantar Toe Great .Severity of Tissue Pre Debridement is: Fat layer exposed. There was a Skin/Subcutaneous Tissue Debridement ([id number]) debridement with total area of 0.1 sq cm performed by STONE III, HOYT E., PA-C. with the following instrument(s): Curette to remove Viable and Non-Viable tissue/material including Exudate, Fibrin/Slough, Callus, and Subcutaneous after achieving pain control using Lidocaine 4%  Topical Solution. A time out was conducted at 08:21, prior to the start of the procedure. A Minimum amount of bleeding was controlled with Pressure. The procedure was tolerated well with a pain level of 0 throughout and a pain level of 0 following the procedure. Post Debridement Measurements: 0.3cm length x 0.6cm width x 0.3cm depth; 0.042cm^3 volume. Character of Wound/Ulcer Post Debridement requires further debridement. Severity of Tissue Post Debridement is: Fat layer exposed. Post procedure Diagnosis Wound #1: Same as Pre-Procedure Plan Wound Cleansing: Wound #1 Right,Plantar Toe Great: Clean wound with Normal Saline. Cleanse wound with mild soap and water May Shower, gently pat wound dry prior to applying new dressing. Anesthetic (add to Medication List): Wound #1 Right,Plantar Toe Great: Topical Lidocaine 4% cream applied to wound bed prior to debridement (In Clinic Only). Primary Wound Dressing: Wound #1 Right,Plantar Toe Great: Other: - endoform Secondary Dressing: Wound #1 Right,Plantar Toe Great: Conform/Kerlix Other - Felt Dressing Change Frequency: Wound #1 Right,Plantar Toe Great: Change dressing every day. Follow-up Appointments: Wound #1 Right,Plantar Toe Great: Return Appointment in 2 weeks. Edema Control: Wound #1 Right,Plantar Toe Great: Elevate legs to the level of the heart and pump ankles as often as possible Additional Orders / Instructions: Wound #1 Right,Plantar Toe Great: Increase protein intake. Activity as tolerated Other: - Multivitamin The following medication(s) was prescribed: lidocaine topical 4 % cream 1 1 cream topical was prescribed at facility Rupert, LOVELLE LEMA. ([id number]) I'm going to recommend at this point in time that we initiate Endoform as I believe this may help with additional and more active healing. Patient is in agreement with this plan. We will subsequently see were things stand in two weeks time when we see him for  reevaluation. Patient is in agreement with this plan. I did explain that I do believe the Endoform will be beneficial for him and I would like him to continue to use this over the next two weeks until I see him for follow-up. Please see above for specific wound care orders. We will see patient for re-evaluation in two week(s) here in the clinic. If anything worsens or changes patient will contact our office for additional recommendations. Electronic Signature(s) Signed: 03/04/2017 4:26:52 PM By: Lenda Kelp PA-C Entered By: Lenda Kelp on 03/04/2017 08:38:42 Osbourne, Enzo Montgomery ([id number]) -------------------------------------------------------------------------------- ROS/PFSH Details Patient Name: Tippy, Lazarus C. Date of Service: 03/04/2017 8:00 AM Medical Record Number: [id number] Patient Account Number: [id number] Date of Birth/Sex: [birth year] (57 y.o. Male) Treating RN: Phillis Haggis Primary Care Provider: Marden Noble Other Clinician: Referring Provider: Marden Noble Treating Provider/Extender: Linwood Dibbles, HOYT Weeks in Treatment: 2 Information Obtained From Patient Wound  History Do you currently have one or more open woundso Yes How many open wounds do you currently haveo 1 Approximately how long have you had your woundso 6 months How have you been treating your wound(s) until nowo soaking and bandage with neosporin Has your wound(s) ever healed and then re-openedo No Have you had any lab work done in the past montho No Have you tested positive for an antibiotic resistant organism (MRSA, VRE)o No Have you tested positive for osteomyelitis (bone infection)o No Have you had any tests for circulation on your legso No Constitutional Symptoms (General Health) Complaints and Symptoms: Negative for: Fever; Chills Eyes Medical History: Negative for: Cataracts; Glaucoma; Optic Neuritis Ear/Nose/Mouth/Throat Medical History: Negative for: Chronic sinus problems/congestion;  Middle ear problems Hematologic/Lymphatic Medical History: Negative for: Anemia; Hemophilia; Human Immunodeficiency Virus; Lymphedema; Sickle Cell Disease Respiratory Complaints and Symptoms: No Complaints or Symptoms Medical History: Positive for: Sleep Apnea - C-pap Negative for: Aspiration; Asthma; Chronic Obstructive Pulmonary Disease (COPD); Pneumothorax; Tuberculosis Cardiovascular Complaints and Symptoms: No Complaints or Symptoms Medical History: Positive for: Hypertension; Myocardial Infarction - 2013 Negative for: Angina; Arrhythmia; Congestive Heart Failure; Coronary Artery Disease; Deep Vein Thrombosis; Hypotension; Scheid, Alem C. ([id number]) Peripheral Arterial Disease; Peripheral Venous Disease; Phlebitis; Vasculitis Gastrointestinal Medical History: Negative for: Cirrhosis ; Colitis; Crohnos; Hepatitis A; Hepatitis B; Hepatitis C Endocrine Medical History: Positive for: Type II Diabetes Negative for: Type I Diabetes Time with diabetes: 2017 Treated with: Insulin, Oral agents Blood sugar tested every day: Yes Tested : daily Blood sugar testing results: Breakfast: 220 Genitourinary Medical History: Negative for: End Stage Renal Disease Immunological Medical History: Negative for: Lupus Erythematosus; Raynaudos; Scleroderma Integumentary (Skin) Medical History: Negative for: History of Burn; History of pressure wounds Musculoskeletal Medical History: Positive for: Gout; Osteoarthritis Negative for: Rheumatoid Arthritis; Osteomyelitis Neurologic Medical History: Positive for: Neuropathy Negative for: Dementia; Quadriplegia; Paraplegia; Seizure Disorder Oncologic Medical History: Negative for: Received Chemotherapy; Received Radiation Psychiatric Complaints and Symptoms: No Complaints or Symptoms Medical History: Negative for: Anorexia/bulimia; Confinement Anxiety Banwart, Aidric C. ([id number]) Immunizations Pneumococcal Vaccine: Received  Pneumococcal Vaccination: No Tetanus Vaccine: Last tetanus shot: 02/12/2013 Implantable Devices Family and Social History Cancer: Yes - Mother; Diabetes: Yes - Father; Heart Disease: Yes - Father; Hypertension: Yes - Father; Kidney Disease: No; Lung Disease: No; Seizures: No; Stroke: Yes - Father; Thyroid Problems: No; Tuberculosis: No; Never smoker; Marital Status - Divorced; Alcohol Use: Moderate; Drug Use: Prior History; Caffeine Use: Moderate; Advanced Directives: No; Patient does not want information on Advanced Directives; Living Will: No; Medical Power of Attorney: No Physician Affirmation I have reviewed and agree with the above information. Electronic Signature(s) Signed: 03/04/2017 4:26:52 PM By: Lenda Kelp PA-C Signed: 03/05/2017 9:12:55 AM By: Alejandro Mulling Entered By: Lenda Kelp on 03/04/2017 08:36:50 Tellado, Enzo Montgomery ([id number]) -------------------------------------------------------------------------------- SuperBill Details Patient Name: Lawal, Math C. Date of Service: 03/04/2017 Medical Record Number: [id number] Patient Account Number: [id number] Date of Birth/Sex: 24-Aug-1960 (57 y.o. Male) Treating RN: Phillis Haggis Primary Care Provider: Marden Noble Other Clinician: Referring Provider: Marden Noble Treating Provider/Extender: Linwood Dibbles, HOYT Weeks in Treatment: 2 Diagnosis Coding ICD-10 Codes Code Description E11.621 Type 2 diabetes mellitus with foot ulcer L97.512 Non-pressure chronic ulcer of other part of right foot with fat layer exposed E66.01 Morbid (severe) obesity due to excess calories I10 Essential (primary) hypertension E11.40 Type 2 diabetes mellitus with diabetic neuropathy, unspecified Facility Procedures CPT4 Code: [id number] Description: 11042 - DEB SUBQ TISSUE 20 SQ CM/< ICD-10 Diagnosis Description L97.512 Non-pressure chronic ulcer  of other part of right foot with fat Modifier: layer exposed Quantity: 1 Physician  Procedures CPT4 Code: [id number] Description: 11042 - WC PHYS SUBQ TISS 20 SQ CM ICD-10 Diagnosis Description L97.512 Non-pressure chronic ulcer of other part of right foot with fat Modifier: layer exposed Quantity: 1 Electronic Signature(s) Signed: 03/04/2017 4:26:52 PM By: Lenda KelpStone III, Hoyt PA-C Entered By: Lenda KelpStone III, Hoyt on 03/04/2017 08:38:50

## 2017-03-07 ENCOUNTER — Ambulatory Visit: Payer: No Typology Code available for payment source | Admitting: Sports Medicine

## 2017-03-08 ENCOUNTER — Ambulatory Visit: Payer: BLUE CROSS/BLUE SHIELD | Admitting: Sports Medicine

## 2017-03-08 ENCOUNTER — Encounter: Payer: Self-pay | Admitting: Sports Medicine

## 2017-03-08 DIAGNOSIS — M79675 Pain in left toe(s): Secondary | ICD-10-CM | POA: Diagnosis not present

## 2017-03-08 DIAGNOSIS — M79674 Pain in right toe(s): Secondary | ICD-10-CM

## 2017-03-08 DIAGNOSIS — M2141 Flat foot [pes planus] (acquired), right foot: Secondary | ICD-10-CM

## 2017-03-08 DIAGNOSIS — E119 Type 2 diabetes mellitus without complications: Secondary | ICD-10-CM

## 2017-03-08 DIAGNOSIS — B351 Tinea unguium: Secondary | ICD-10-CM | POA: Diagnosis not present

## 2017-03-08 DIAGNOSIS — L97511 Non-pressure chronic ulcer of other part of right foot limited to breakdown of skin: Secondary | ICD-10-CM

## 2017-03-08 DIAGNOSIS — E1140 Type 2 diabetes mellitus with diabetic neuropathy, unspecified: Secondary | ICD-10-CM

## 2017-03-08 DIAGNOSIS — M2042 Other hammer toe(s) (acquired), left foot: Secondary | ICD-10-CM

## 2017-03-08 DIAGNOSIS — M2142 Flat foot [pes planus] (acquired), left foot: Secondary | ICD-10-CM

## 2017-03-08 DIAGNOSIS — M2041 Other hammer toe(s) (acquired), right foot: Secondary | ICD-10-CM

## 2017-03-08 NOTE — Patient Instructions (Signed)

## 2017-03-08 NOTE — Progress Notes (Signed)
Subjective: Prince RomeRobert C Repetto is a 57 y.o. male patient with history of diabetes who presents to office today for foot exam. Was recommended by wound center and PCP to be evaluated for diabetic shoes. Patient  states that the glucose reading this morning was 156 mg/dl. A1c 7.2.  Patient denies any new changes in medication or new problems. Patient admits numbness and history of neuropathy in both feet. No other issues noted.  LOV with PCP: 1-2 weeks ago  Patient Active Problem List   Diagnosis Date Noted  . GERD (gastroesophageal reflux disease) 12/27/2015  . Arthritis 11/16/2015  . Diabetes mellitus (HCC) 11/16/2015  . Hyperlipidemia 11/16/2015  . Localized edema 11/16/2015  . OBESITY 05/15/2007  . ADD 05/15/2007  . HYPERTENSION 05/15/2007  . Allergic rhinitis 05/15/2007  . SLEEP APNEA 05/15/2007  . COUGH 05/15/2007   Current Outpatient Medications on File Prior to Visit  Medication Sig Dispense Refill  . aspirin EC 81 MG tablet Take 81 mg by mouth daily.    . Cetirizine HCl (ZYRTEC ALLERGY PO) Take 10 mg by mouth daily.    Marland Kitchen. glimepiride (AMARYL) 4 MG tablet Take 4 mg by mouth daily with breakfast.     . insulin NPH-regular Human (NOVOLIN 70/30) (70-30) 100 UNIT/ML injection Inject 40 Units into the skin 2 (two) times daily with a meal.    . metFORMIN (GLUCOPHAGE-XR) 500 MG 24 hr tablet Take 1,000 mg by mouth 2 (two) times daily.     . montelukast (SINGULAIR) 10 MG tablet TAKE 1 TABLET BY MOUTH AT BEDTIME 30 tablet 3  . pantoprazole (PROTONIX) 40 MG tablet TAKE 1 TABLET BY MOUTH TWICE DAILY 60 tablet 3  . ranitidine (ZANTAC) 150 MG tablet Take 1 tablet (150 mg total) by mouth 2 (two) times daily. 60 tablet 3  . Spacer/Aero-Holding Chambers (AEROCHAMBER MV) inhaler Use as instructed 1 each 0   No current facility-administered medications on file prior to visit.    No Known Allergies  No results found for this or any previous visit (from the past 2160 hour(s)).  Objective: General:  Patient is awake, alert, and oriented x 3 and in no acute distress.  Integument: Skin is warm, dry and supple bilateral. Nails are tender, long, thickened and  dystrophic with subungual debris, consistent with onychomycosis, 1-5 on left and 2-5 on right. Right plantar hallux toe ulceration <1cm with fibro granular base with no acute signs of infection. +callus distal tuft of right 2nd toe. Remaining integument unremarkable.  Vasculature:  Dorsalis Pedis pulse 1/4 bilateral. Posterior Tibial pulse  1/4 bilateral.  Capillary fill time <3 sec 1-5 bilateral. Positive hair growth to the level of the digits. Temperature gradient within normal limits. No varicosities present bilateral. No edema present bilateral.   Neurology: The patient has absent sensation measured with a 5.07/10g Semmes Weinstein Monofilament at all pedal sites bilateral . Vibratory sensation diminished bilateral with tuning fork. No Babinski sign present bilateral.   Musculoskeletal: No symptomatic pedal deformities noted bilateral. Muscular strength 5/5 in all lower extremity muscular groups bilateral without pain on range of motion . No tenderness with calf compression bilateral.  Assessment and Plan: Problem List Items Addressed This Visit      Endocrine   Diabetes mellitus (HCC)    Other Visit Diagnoses    Encounter for comprehensive diabetic foot examination, type 2 diabetes mellitus (HCC)    -  Primary   Foot ulcer, right, limited to breakdown of skin (HCC)  Currently seeing wound care center   Pain due to onychomycosis of toenails of both feet       Hammer toes of both feet       Pes planus of both feet         -Examined patient. -Discussed and educated patient on diabetic foot care, especially with  regards to the vascular, neurological and musculoskeletal systems.  -Stressed the importance of good glycemic control and the detriment of not  controlling glucose levels in relation to the foot. -Mechanically  debrided all nails 1-5 on left and 2-5 on right using sterile nail nipper and filed with dremel without incident  -Continue with follow up at wound care center for right great toe ulcer  -Answered all patient questions -Patient to return  in 3 months for at risk foot care and for diabetic shoes measurements when called; safe-step form completed on patient behalf since he is high risk with digital deformity and wound  -Patient advised to call the office if any problems or questions arise in the meantime.  [name], DPM

## 2017-03-18 ENCOUNTER — Encounter: Payer: BLUE CROSS/BLUE SHIELD | Attending: Physician Assistant | Admitting: Physician Assistant

## 2017-03-18 DIAGNOSIS — M109 Gout, unspecified: Secondary | ICD-10-CM | POA: Insufficient documentation

## 2017-03-18 DIAGNOSIS — G4730 Sleep apnea, unspecified: Secondary | ICD-10-CM | POA: Diagnosis not present

## 2017-03-18 DIAGNOSIS — M1990 Unspecified osteoarthritis, unspecified site: Secondary | ICD-10-CM | POA: Diagnosis not present

## 2017-03-18 DIAGNOSIS — Z794 Long term (current) use of insulin: Secondary | ICD-10-CM | POA: Insufficient documentation

## 2017-03-18 DIAGNOSIS — I10 Essential (primary) hypertension: Secondary | ICD-10-CM | POA: Diagnosis not present

## 2017-03-18 DIAGNOSIS — L97512 Non-pressure chronic ulcer of other part of right foot with fat layer exposed: Secondary | ICD-10-CM | POA: Diagnosis not present

## 2017-03-18 DIAGNOSIS — E1140 Type 2 diabetes mellitus with diabetic neuropathy, unspecified: Secondary | ICD-10-CM | POA: Diagnosis not present

## 2017-03-18 DIAGNOSIS — Z823 Family history of stroke: Secondary | ICD-10-CM | POA: Insufficient documentation

## 2017-03-18 DIAGNOSIS — E11621 Type 2 diabetes mellitus with foot ulcer: Secondary | ICD-10-CM | POA: Diagnosis not present

## 2017-03-18 DIAGNOSIS — I252 Old myocardial infarction: Secondary | ICD-10-CM | POA: Insufficient documentation

## 2017-03-18 DIAGNOSIS — L97519 Non-pressure chronic ulcer of other part of right foot with unspecified severity: Secondary | ICD-10-CM | POA: Diagnosis not present

## 2017-03-19 NOTE — Progress Notes (Signed)
[name] ([id number]) Visit Report for 03/18/2017 Chief Complaint Document Details Patient Name: [name], [name]. Date of Service: 03/18/2017 8:00 AM Medical Record Number: [id number] Patient Account Number: [id number] Date of Birth/Sex: September 06, 1960 (57 y.o. Male) Treating RN: Phillis Haggis Primary Care Provider: Marden Noble Other Clinician: Referring Provider: Marden Noble Treating Provider/Extender: Linwood Dibbles, HOYT Weeks in Treatment: 4 Information Obtained from: Patient Chief Complaint Right 1st Toe Plantar Ulcer Electronic Signature(s) Signed: 03/18/2017 4:49:26 PM By: Lenda Kelp PA-C Entered By: Lenda Kelp on 03/18/2017 08:17:35 [name] ([id number]) -------------------------------------------------------------------------------- Debridement Details Patient Name: Fleek, [name] Date of Service: 03/18/2017 8:00 AM Medical Record Number: [id number] Patient Account Number: [id number] Date of Birth/Sex: [birth year] (57 y.o. Male) Treating RN: Ashok Cordia, Debi Primary Care Provider: Marden Noble Other Clinician: Referring Provider: Marden Noble Treating Provider/Extender: Linwood Dibbles, HOYT Weeks in Treatment: 4 Debridement Performed for Wound #1 Right,Plantar Toe Great Assessment: Performed By: Physician STONE III, HOYT E., PA-C Debridement: Debridement Severity of Tissue Pre Fat layer exposed Debridement: Pre-procedure Verification/Time Yes - 08:25 Out Taken: Start Time: 08:26 Pain Control: Lidocaine 4% Topical Solution Level: Skin/Subcutaneous Tissue Total Area Debrided (L x W): 0.3 (cm) x 0.2 (cm) = 0.06 (cm) Tissue and other material Viable, Non-Viable, Exudate, Fibrin/Slough, Subcutaneous debrided: Instrument: Curette Bleeding: Minimum Hemostasis Achieved: Pressure End Time: 08:30 Procedural Pain: 0 Post Procedural Pain: 0 Response to Treatment: Procedure was tolerated well Post Debridement Measurements of Total Wound Length: (cm)  0.4 Width: (cm) 0.3 Depth: (cm) 0.4 Volume: (cm) 0.038 Character of Wound/Ulcer Post Debridement: Requires Further Debridement Severity of Tissue Post Debridement: Fat layer exposed Post Procedure Diagnosis Same as Pre-procedure Electronic Signature(s) Signed: 03/18/2017 4:26:47 PM By: Alejandro Mulling Signed: 03/18/2017 4:49:26 PM By: Lenda Kelp PA-C Entered By: Alejandro Mulling on 03/18/2017 08:29:51 [name] ([id number]) -------------------------------------------------------------------------------- HPI Details Patient Name: [name], [name] Date of Service: 03/18/2017 8:00 AM Medical Record Number: [id number] Patient Account Number: [id number] Date of Birth/Sex: 1960/07/11 (57 y.o. Male) Treating RN: Phillis Haggis Primary Care Provider: Marden Noble Other Clinician: Referring Provider: Marden Noble Treating Provider/Extender: Linwood Dibbles, HOYT Weeks in Treatment: 4 History of Present Illness Associated Signs and Symptoms: Patient has a history of diabetes mellitus which is under okay control, obesity, bilateral lower extremity leg swelling, diabetic neuropathy, and hypertension HPI Description: 02/19/16 on evaluation today patient presents for initial evaluation concerning an ulcer on his right great toe which has been present for roughly 6 months to a year. He does not know exactly when he first noted this. Nonetheless he is diabetic and is a once he runs around 7.1 currently and so is under the okay control. He also is obese and has chronic bilateral lower extremity swelling. Patient also does have diabetic neuropathy unfortunately. For this reason he unfortunately did have an injury to his right great toe which he was initially unaware of and then since has been "doctoring on himself" unfortunately it has not seem to really improve though the good news is it does not seem to extend to deeply. He has not really seen too many people for this other than his primary care  provider at this point that [facility] physicians Dr. Kevan Ny. He was at one time put on antibiotics for this although it has been completed this was Septra but that was back in November 2018. Currently there is no evidence of infection. Patient does tell me that he attempted to work on some the callous himself but really does not have a  good angle to be able to see what is going on down there. He does have a significant amount of callous surrounding the wound bed. 03/04/17 on evaluation today patient presents for follow-up concerning his right great toe ulcer. He has been doing fairly well over the past two weeks since I initially saw him. He states that overall the appearance of the wound does seem to be better in his opinion. Fortunately he has no evidence of infection at this time. He has been mainly just using a dry gauze over the wound and then covering this with a dressing. He has no major concerns other than where we are gonna go after today as far as follow-up is concerned. 03/18/17 on evaluation today patient appears to be doing decently well in regard to his right great toe ulcer. He does have some improvement in the margins of his wound but unfortunately the death does not appear to be doing much better. He has been using the Endoform as recommended. Currently he is having no discomfort secondary to neuropathy. He does tell me that the wound typically bleeds every day I believe this is likely evidence of the fact that he is getting pressure to the wound site and this likely is also reducing the ability to heal at least as quickly. Electronic Signature(s) Signed: 03/18/2017 4:49:26 PM By: Lenda KelpStone III, Hoyt PA-C Entered By: Lenda KelpStone III, Hoyt on 03/18/2017 12:36:03 [name], [name] ([id number]) -------------------------------------------------------------------------------- Physical Exam Details Patient Name: [name], [name] Date of Service: 03/18/2017 8:00 AM Medical Record Number:  [id number] Patient Account Number: [id number] Date of Birth/Sex: 1960-09-06 [age] y.o. Male) Treating RN: Phillis HaggisPinkerton, Debi Primary Care Provider: Marden NobleGATES, Danil Other Clinician: Referring Provider: Marden NobleGATES, Kaz Treating Provider/Extender: STONE III, HOYT Weeks in Treatment: 4 Constitutional Well-nourished and well-hydrated in no acute distress. Respiratory normal breathing without difficulty. Psychiatric this patient is able to make decisions and demonstrates good insight into disease process. Alert and Oriented x 3. pleasant and cooperative. Notes Patient does have callous around the wound bed unfortunately. There's also some Slough in the base of the wound. I did sharply debride the area well today clearing away the callous and eliminated the slough in the core the wound. Hopefully this will help with ongoing and continued improvement. I'm gonna suggest we switch dressings please see the plan section for discussion of this. Electronic Signature(s) Signed: 03/18/2017 4:49:26 PM By: Lenda KelpStone III, Hoyt PA-C Entered By: Lenda KelpStone III, Hoyt on 03/18/2017 12:37:35 Marchuk, [name] ([id number]) -------------------------------------------------------------------------------- Physician Orders Details Patient Name: Stief, [name] Date of Service: 03/18/2017 8:00 AM Medical Record Number: [id number] Patient Account Number: [id number] Date of Birth/Sex: 1960-09-06 11(56 y.o. Male) Treating RN: Ashok CordiaPinkerton, Debi Primary Care Provider: Marden NobleGATES, Orlanda Other Clinician: Referring Provider: Marden NobleGATES, Jayceion Treating Provider/Extender: Linwood DibblesSTONE III, HOYT Weeks in Treatment: 4 Verbal / Phone Orders: Yes Clinician: Ashok CordiaPinkerton, Debi Read Back and Verified: Yes Diagnosis Coding ICD-10 Coding Code Description E11.621 Type 2 diabetes mellitus with foot ulcer L97.512 Non-pressure chronic ulcer of other part of right foot with fat layer exposed E66.01 Morbid (severe) obesity due to excess calories I10 Essential (primary)  hypertension E11.40 Type 2 diabetes mellitus with diabetic neuropathy, unspecified Wound Cleansing Wound #1 Right,Plantar Toe Great o Clean wound with Normal Saline. o Cleanse wound with mild soap and water o May Shower, gently pat wound dry prior to applying new dressing. Anesthetic (add to Medication List) Wound #1 Right,Plantar Toe Great o Topical Lidocaine 4% cream applied to wound bed prior to debridement (In Clinic Only).  Primary Wound Dressing Wound #1 Right,Plantar Toe Great o Silvercel Non-Adherent - lightly pack in wound Secondary Dressing Wound #1 Right,Plantar Toe Great o Conform/Kerlix o Foam - or Felt Dressing Change Frequency Wound #1 Right,Plantar Toe Great o Change dressing every other day. Follow-up Appointments Wound #1 Right,Plantar Toe Great o Return Appointment in 2 weeks. Edema Control Wound #1 Right,Plantar Toe Great o Elevate legs to the level of the heart and pump ankles as often as possible Off-Loading Archambeau, [name] ([id number]) Wound #1 Right,Plantar Toe Great o Open toe surgical shoe with peg assist. o Other: - no pressure to area Additional Orders / Instructions Wound #1 Right,Plantar Toe Great o Increase protein intake. o Activity as tolerated o Other: - Multivitamin Patient Medications Allergies: No Known Drug Allergies Notifications Medication Indication Start End lidocaine DOSE 1 - topical 4 % cream - 1 cream topical Electronic Signature(s) Signed: 03/18/2017 4:26:47 PM By: Alejandro Mulling Signed: 03/18/2017 4:49:26 PM By: Lenda Kelp PA-C Entered By: Alejandro Mulling on 03/18/2017 08:31:59 Usery, [name] ([id number]) -------------------------------------------------------------------------------- Prescription 03/18/2017 Patient Name: Dawood, [name] Provider: Lenda Kelp PA-C Date of Birth: [birth year] NPI#: [id number] Sex: Judie Petit DEA#: [id number] Phone #: [phone] License #: Patient Address:  [facility] [address] [facility] [place], [place] [address] [address], [address] [place], [place] [address] [phone] Allergies No Known Drug Allergies Medication Medication: Route: Strength: Form: lidocaine 4 % topical cream topical 4% cream Class: TOPICAL LOCAL ANESTHETICS Dose: Frequency / Time: Indication: 1 1 cream topical Number of Refills: Number of Units: 0 Generic Substitution: Start Date: End Date: One Time Use: Substitution Permitted No Note to Pharmacy: Signature(s): Date(s): Electronic Signature(s) Signed: 03/18/2017 4:26:47 PM By: Alejandro Mulling Signed: 03/18/2017 4:49:26 PM By: Lenda Kelp PA-C Entered By: Alejandro Mulling on 03/18/2017 08:31:59 Jaquith, [name] ([id number]) --------------------------------------------------------------------------------  Problem List Details Patient Name: Guion, Teal C. Date of Service: 03/18/2017 8:00 AM Medical Record Number: [id number] Patient Account Number: [id number] Date of Birth/Sex: Nov 09, 1960 (57 y.o. Male) Treating RN: Phillis Haggis Primary Care Provider: Marden Noble Other Clinician: Referring Provider: Marden Noble Treating Provider/Extender: Linwood Dibbles, HOYT Weeks in Treatment: 4 Active Problems ICD-10 Encounter Code Description Active Date Diagnosis E11.621 Type 2 diabetes mellitus with foot ulcer 02/18/2017 Yes L97.512 Non-pressure chronic ulcer of other part of right foot with fat layer 02/18/2017 Yes exposed E66.01 Morbid (severe) obesity due to excess calories 02/18/2017 Yes I10 Essential (primary) hypertension 02/18/2017 Yes E11.40 Type 2 diabetes mellitus with diabetic neuropathy, unspecified 02/18/2017 Yes Inactive Problems Resolved Problems Electronic Signature(s) Signed: 03/18/2017 4:49:26 PM By: Lenda Kelp PA-C Entered By: Lenda Kelp on 03/18/2017 08:17:11 Lempke, [name]  ([id number]) -------------------------------------------------------------------------------- Progress Note Details Patient Name: Cullimore, Angelgabriel C. Date of Service: 03/18/2017 8:00 AM Medical Record Number: [id number] Patient Account Number: [id number] Date of Birth/Sex: [birth year] (57 y.o. Male) Treating RN: Phillis Haggis Primary Care Provider: Marden Noble Other Clinician: Referring Provider: Marden Noble Treating Provider/Extender: Linwood Dibbles, HOYT Weeks in Treatment: 4 Subjective Chief Complaint Information obtained from Patient Right 1st Toe Plantar Ulcer History of Present Illness (HPI) The following HPI elements were documented for the patient's wound: Associated Signs and Symptoms: Patient has a history of diabetes mellitus which is under okay control, obesity, bilateral lower extremity leg swelling, diabetic neuropathy, and hypertension 02/19/16 on evaluation today patient presents for initial evaluation concerning an ulcer on his right great toe which has been present for roughly 6 months to a year.  He does not know exactly when he first noted this. Nonetheless he is diabetic and is a once he runs around 7.1 currently and so is under the okay control. He also is obese and has chronic bilateral lower extremity swelling. Patient also does have diabetic neuropathy unfortunately. For this reason he unfortunately did have an injury to his right great toe which he was initially unaware of and then since has been "doctoring on himself" unfortunately it has not seem to really improve though the good news is it does not seem to extend to deeply. He has not really seen too many people for this other than his primary care provider at this point that [facility] physicians Dr. Kevan Ny. He was at one time put on antibiotics for this although it has been completed this was Septra but that was back in November 2018. Currently there is no evidence of infection. Patient does tell me that he attempted to  work on some the callous himself but really does not have a good angle to be able to see what is going on down there. He does have a significant amount of callous surrounding the wound bed. 03/04/17 on evaluation today patient presents for follow-up concerning his right great toe ulcer. He has been doing fairly well over the past two weeks since I initially saw him. He states that overall the appearance of the wound does seem to be better in his opinion. Fortunately he has no evidence of infection at this time. He has been mainly just using a dry gauze over the wound and then covering this with a dressing. He has no major concerns other than where we are gonna go after today as far as follow-up is concerned. 03/18/17 on evaluation today patient appears to be doing decently well in regard to his right great toe ulcer. He does have some improvement in the margins of his wound but unfortunately the death does not appear to be doing much better. He has been using the Endoform as recommended. Currently he is having no discomfort secondary to neuropathy. He does tell me that the wound typically bleeds every day I believe this is likely evidence of the fact that he is getting pressure to the wound site and this likely is also reducing the ability to heal at least as quickly. Patient History Information obtained from Patient. Family History Cancer - Mother, Diabetes - Father, Heart Disease - Father, Hypertension - Father, Stroke - Father, No family history of Kidney Disease, Lung Disease, Seizures, Thyroid Problems, Tuberculosis. Social History Never smoker, Marital Status - Divorced, Alcohol Use - Moderate, Drug Use - Prior History, Caffeine Use - Moderate. Review of Systems (ROS) Constitutional Symptoms (General Health) Denies complaints or symptoms of Fever, Chills. Hass, [place] ([id number]) Respiratory The patient has no complaints or symptoms. Cardiovascular The patient has no complaints or  symptoms. Psychiatric The patient has no complaints or symptoms. Objective Constitutional Well-nourished and well-hydrated in no acute distress. Vitals Time Taken: 8:06 AM, Height: 71 in, Weight: 318.4 lbs, BMI: 44.4, Temperature: 97.7 F, Pulse: 89 bpm, Respiratory Rate: 18 breaths/min, Blood Pressure: 164/84 mmHg. Respiratory normal breathing without difficulty. Psychiatric this patient is able to make decisions and demonstrates good insight into disease process. Alert and Oriented x 3. pleasant and cooperative. General Notes: Patient does have callous around the wound bed unfortunately. There's also some Slough in the base of the wound. I did sharply debride the area well today clearing away the callous and eliminated the slough in  the core the wound. Hopefully this will help with ongoing and continued improvement. I'm gonna suggest we switch dressings please see the plan section for discussion of this. Integumentary (Hair, Skin) Wound #1 status is Open. Original cause of wound was Gradually Appeared. The wound is located on the [employer]. The wound measures 0.3cm length x 0.2cm width x 0.4cm depth; 0.047cm^2 area and 0.019cm^3 volume. There is Fat Layer (Subcutaneous Tissue) Exposed exposed. There is undermining starting at 12:00 and ending at 12:00 with a maximum distance of 0.5cm. There is a medium amount of serosanguineous drainage noted. The wound margin is well defined and not attached to the wound base. There is medium (34-66%) red granulation within the wound bed. There is no necrotic tissue within the wound bed. The periwound skin appearance exhibited: Callus. Periwound temperature was noted as No Abnormality. Assessment Active Problems ICD-10 E11.621 - Type 2 diabetes mellitus with foot ulcer L97.512 - Non-pressure chronic ulcer of other part of right foot with fat layer exposed E66.01 - Morbid (severe) obesity due to excess calories I10 - Essential  (primary) hypertension Misenheimer, [name] ([id number]) E11.40 - Type 2 diabetes mellitus with diabetic neuropathy, unspecified Procedures Wound #1 Pre-procedure diagnosis of Wound #1 is a Diabetic Wound/Ulcer of the Lower Extremity located on the Right,Plantar Toe Great .Severity of Tissue Pre Debridement is: Fat layer exposed. There was a Skin/Subcutaneous Tissue Debridement ([id number]) debridement with total area of 0.06 sq cm performed by STONE III, HOYT E., PA-C. with the following instrument(s): Curette to remove Viable and Non-Viable tissue/material including Exudate, Fibrin/Slough, and Subcutaneous after achieving pain control using Lidocaine 4% Topical Solution. A time out was conducted at 08:25, prior to the start of the procedure. A Minimum amount of bleeding was controlled with Pressure. The procedure was tolerated well with a pain level of 0 throughout and a pain level of 0 following the procedure. Post Debridement Measurements: 0.4cm length x 0.3cm width x 0.4cm depth; 0.038cm^3 volume. Character of Wound/Ulcer Post Debridement requires further debridement. Severity of Tissue Post Debridement is: Fat layer exposed. Post procedure Diagnosis Wound #1: Same as Pre-Procedure Plan Wound Cleansing: Wound #1 Right,Plantar Toe Great: Clean wound with Normal Saline. Cleanse wound with mild soap and water May Shower, gently pat wound dry prior to applying new dressing. Anesthetic (add to Medication List): Wound #1 Right,Plantar Toe Great: Topical Lidocaine 4% cream applied to wound bed prior to debridement (In Clinic Only). Primary Wound Dressing: Wound #1 Right,Plantar Toe Great: Silvercel Non-Adherent - lightly pack in wound Secondary Dressing: Wound #1 Right,Plantar Toe Great: Conform/Kerlix Foam - or Felt Dressing Change Frequency: Wound #1 Right,Plantar Toe Great: Change dressing every other day. Follow-up Appointments: Wound #1 Right,Plantar Toe Great: Return Appointment  in 2 weeks. Edema Control: Wound #1 Right,Plantar Toe Great: Elevate legs to the level of the heart and pump ankles as often as possible Off-Loading: Wound #1 Right,Plantar Toe Great: Open toe surgical shoe with peg assist. Other: - no pressure to area Additional Orders / Instructions: BAYRON, DALTO. ([id number]) Wound #1 Right,Plantar Toe Great: Increase protein intake. Activity as tolerated Other: - Multivitamin The following medication(s) was prescribed: lidocaine topical 4 % cream 1 1 cream topical was prescribed at facility We will see were things stand in two weeks time again this is per patient request due to cost. This may indeed slow down some of our healing as far as more frequent debridement is concerned although I'm hoping that the peg assist offloading shoe will be of  benefit for him. We did initiate that today. He also did see podiatry in regard to recommendations for diabetic/offloading shoes. We're gonna see were things stand and one weeks time. Please see above for specific wound care orders. We will see patient for re-evaluation in 1 week(s) here in the clinic. If anything worsens or changes patient will contact our office for additional recommendations. Electronic Signature(s) Signed: 03/18/2017 4:49:26 PM By: Lenda Kelp PA-C Entered By: Lenda Kelp on 03/18/2017 12:38:34 Iser, [name] ([id number]) -------------------------------------------------------------------------------- ROS/PFSH Details Patient Name: Peaster, [name] Date of Service: 03/18/2017 8:00 AM Medical Record Number: [id number] Patient Account Number: [id number] Date of Birth/Sex: April 26, 1960 (57 y.o. Male) Treating RN: Phillis Haggis Primary Care Provider: Marden Noble Other Clinician: Referring Provider: Marden Noble Treating Provider/Extender: Linwood Dibbles, HOYT Weeks in Treatment: 4 Information Obtained From Patient Wound History Do you currently have one or more open woundso  Yes How many open wounds do you currently haveo 1 Approximately how long have you had your woundso 6 months How have you been treating your wound(s) until nowo soaking and bandage with neosporin Has your wound(s) ever healed and then re-openedo No Have you had any lab work done in the past montho No Have you tested positive for an antibiotic resistant organism (MRSA, VRE)o No Have you tested positive for osteomyelitis (bone infection)o No Have you had any tests for circulation on your legso No Constitutional Symptoms (General Health) Complaints and Symptoms: Negative for: Fever; Chills Eyes Medical History: Negative for: Cataracts; Glaucoma; Optic Neuritis Ear/Nose/Mouth/Throat Medical History: Negative for: Chronic sinus problems/congestion; Middle ear problems Hematologic/Lymphatic Medical History: Negative for: Anemia; Hemophilia; Human Immunodeficiency Virus; Lymphedema; Sickle Cell Disease Respiratory Complaints and Symptoms: No Complaints or Symptoms Medical History: Positive for: Sleep Apnea - C-pap Negative for: Aspiration; Asthma; Chronic Obstructive Pulmonary Disease (COPD); Pneumothorax; Tuberculosis Cardiovascular Complaints and Symptoms: No Complaints or Symptoms Medical History: Positive for: Hypertension; Myocardial Infarction - 2013 Negative for: Angina; Arrhythmia; Congestive Heart Failure; Coronary Artery Disease; Deep Vein Thrombosis; Hypotension; Greenley, [name] ([id number]) Peripheral Arterial Disease; Peripheral Venous Disease; Phlebitis; Vasculitis Gastrointestinal Medical History: Negative for: Cirrhosis ; Colitis; Crohnos; Hepatitis A; Hepatitis B; Hepatitis C Endocrine Medical History: Positive for: Type II Diabetes Negative for: Type I Diabetes Time with diabetes: 2017 Treated with: Insulin, Oral agents Blood sugar tested every day: Yes Tested : daily Blood sugar testing results: Breakfast: 220 Genitourinary Medical History: Negative for:  End Stage Renal Disease Immunological Medical History: Negative for: Lupus Erythematosus; Raynaudos; Scleroderma Integumentary (Skin) Medical History: Negative for: History of Burn; History of pressure wounds Musculoskeletal Medical History: Positive for: Gout; Osteoarthritis Negative for: Rheumatoid Arthritis; Osteomyelitis Neurologic Medical History: Positive for: Neuropathy Negative for: Dementia; Quadriplegia; Paraplegia; Seizure Disorder Oncologic Medical History: Negative for: Received Chemotherapy; Received Radiation Psychiatric Complaints and Symptoms: No Complaints or Symptoms Medical History: Negative for: Anorexia/bulimia; Confinement Anxiety Dibartolo, [name] ([id number]) Immunizations Pneumococcal Vaccine: Received Pneumococcal Vaccination: No Tetanus Vaccine: Last tetanus shot: 02/12/2013 Implantable Devices Family and Social History Cancer: Yes - Mother; Diabetes: Yes - Father; Heart Disease: Yes - Father; Hypertension: Yes - Father; Kidney Disease: No; Lung Disease: No; Seizures: No; Stroke: Yes - Father; Thyroid Problems: No; Tuberculosis: No; Never smoker; Marital Status - Divorced; Alcohol Use: Moderate; Drug Use: Prior History; Caffeine Use: Moderate; Advanced Directives: No; Patient does not want information on Advanced Directives; Living Will: No; Medical Power of Attorney: No Physician Affirmation I have reviewed and agree with the above information. Electronic Signature(s) Signed: 03/18/2017 4:26:47 PM  By: Alejandro Mulling Signed: 03/18/2017 4:49:26 PM By: Lenda Kelp PA-C Entered By: Lenda Kelp on 03/18/2017 12:36:31 Morning, [name] ([id number]) -------------------------------------------------------------------------------- SuperBill Details Patient Name: Lamay, [name] Date of Service: 03/18/2017 Medical Record Number: [id number] Patient Account Number: [id number] Date of Birth/Sex: 24-Jun-1960 (57 y.o. 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 E66.01 Morbid (severe) obesity due to excess calories I10 Essential (primary) hypertension E11.40 Type 2 diabetes mellitus with diabetic neuropathy, unspecified Facility Procedures CPT4 Code: 78295621 Description: 11042 - DEB SUBQ TISSUE 20 SQ CM/< ICD-10 Diagnosis Description L97.512 Non-pressure chronic ulcer of other part of right foot with fat Modifier: layer exposed Quantity: 1 Physician Procedures CPT4 Code: 3086578 Description: 11042 - WC PHYS SUBQ TISS 20 SQ CM ICD-10 Diagnosis Description L97.512 Non-pressure chronic ulcer of other part of right foot with fat Modifier: layer exposed Quantity: 1 Electronic Signature(s) Signed: 03/18/2017 4:49:26 PM By: Lenda Kelp PA-C Entered By: Lenda Kelp on 03/18/2017 12:39:01

## 2017-03-21 NOTE — Progress Notes (Signed)
[name], Inman C. ([id number]) Visit Report for 03/18/2017 Arrival Information Details Patient Name: [name], Jacarie C. Date of Service: 03/18/2017 8:00 AM Medical Record Number: [id number] Patient Account Number: [id number] Date of Birth/Sex: Jul 14, 1960 [age] y.o. Male) Treating RN: Phillis HaggisPinkerton, Debi Primary Care Marquail Bradwell: Marden NobleGATES, Pape Other Clinician: Referring Carlton Buskey: Marden NobleGATES, Shigeo Treating Audrie Kuri/Extender: Linwood DibblesSTONE III, HOYT Weeks in Treatment: 4 Visit Information History Since Last Visit All ordered tests and consults were completed: No Patient Arrived: Ambulatory Added or deleted any medications: No Arrival Time: 08:05 Any new allergies or adverse reactions: No Accompanied By: self Had a fall or experienced change in No Transfer Assistance: None activities of daily living that may affect Patient Identification Verified: Yes risk of falls: Secondary Verification Process Yes Signs or symptoms of abuse/neglect since last visito No Completed: Hospitalized since last visit: No Patient Requires Transmission-Based No Has Dressing in Place as Prescribed: Yes Precautions: Pain Present Now: No Patient Has Alerts: Yes Patient Alerts: Patient on Blood Thinner Type II Diabetic 81mg  aspirin Electronic Signature(s) Signed: 03/18/2017 4:26:47 PM By: Alejandro MullingPinkerton, Debra Entered By: Alejandro MullingPinkerton, Debra on 03/18/2017 08:06:36 Calise, Enzo MontgomeryOBERT C. ([id number]) -------------------------------------------------------------------------------- Encounter Discharge Information Details Patient Name: Job, Eissa C. Date of Service: 03/18/2017 8:00 AM Medical Record Number: [id number] Patient Account Number: [id number] Date of Birth/Sex: Jul 14, 1960 [age] y.o. Male) Treating RN: Phillis HaggisPinkerton, Debi Primary Care Blinda Turek: Marden NobleGATES, Yerick Other Clinician: Referring Levell Tavano: Marden NobleGATES, Koichi Treating Isahia Hollerbach/Extender: Linwood DibblesSTONE III, HOYT Weeks in Treatment: 4 Encounter Discharge Information Items Discharge Pain Level:  0 Discharge Condition: Stable Ambulatory Status: Ambulatory Discharge Destination: Home Transportation: Private Auto Accompanied By: self Schedule Follow-up Appointment: Yes Medication Reconciliation completed and No provided to Patient/Care Yitzchak Kothari: Provided on Clinical Summary of Care: 03/18/2017 Form Type Recipient Paper [facility] Electronic Signature(s) Signed: 03/20/2017 9:47:07 AM By: Gwenlyn PerkingMoore, Shelia Entered By: Gwenlyn PerkingMoore, Shelia on 03/18/2017 08:46:13 Knickerbocker, Enzo MontgomeryOBERT C. ([id number]) -------------------------------------------------------------------------------- Lower Extremity Assessment Details Patient Name: Flow, Kedron C. Date of Service: 03/18/2017 8:00 AM Medical Record Number: [id number] Patient Account Number: [id number] Date of Birth/Sex: Jul 14, 1960 [age] y.o. Male) Treating RN: Phillis HaggisPinkerton, Debi Primary Care Yetta Marceaux: Marden NobleGATES, Wendy Other Clinician: Referring Jereld Presti: Marden NobleGATES, Jasn Treating Yeva Bissette/Extender: Linwood DibblesSTONE III, HOYT Weeks in Treatment: 4 Vascular Assessment Pulses: Dorsalis Pedis Palpable: [Right:Yes] Posterior Tibial Extremity colors, hair growth, and conditions: Extremity Color: [Right:Normal] Temperature of Extremity: [Right:Warm] Capillary Refill: [Right:< 3 seconds] Toe Nail Assessment Left: Right: Thick: Yes Discolored: No Deformed: No Improper Length and Hygiene: No Electronic Signature(s) Signed: 03/18/2017 4:26:47 PM By: Alejandro MullingPinkerton, Debra Entered By: Alejandro MullingPinkerton, Debra on 03/18/2017 08:21:34 Crilly, Enzo MontgomeryOBERT C. ([id number]) -------------------------------------------------------------------------------- Multi Wound Chart Details Patient Name: Cullinan, Bruk C. Date of Service: 03/18/2017 8:00 AM Medical Record Number: [id number] Patient Account Number: [id number] Date of Birth/Sex: Jul 14, 1960 [age] y.o. Male) Treating RN: Phillis HaggisPinkerton, Debi Primary Care Marlo Goodrich: Marden NobleGATES, Prentis Other Clinician: Referring Shylo Zamor: Marden NobleGATES, Gerrell Treating Samanthan Dugo/Extender:  Linwood DibblesSTONE III, HOYT Weeks in Treatment: 4 Vital Signs Height(in): 71 Pulse(bpm): 89 Weight(lbs): 318.4 Blood Pressure(mmHg): 164/84 Body Mass Index(BMI): 44 Temperature(F): 97.7 Respiratory Rate 18 (breaths/min): Photos: [1:No Photos] [N/A:N/A] Wound Location: [1:Right Toe Great - Plantar] [N/A:N/A] Wounding Event: [1:Gradually Appeared] [N/A:N/A] Primary Etiology: [1:Diabetic Wound/Ulcer of the Lower Extremity] [N/A:N/A] Secondary Etiology: [1:Pressure Ulcer] [N/A:N/A] Comorbid History: [1:Sleep Apnea, Hypertension, Myocardial Infarction, Type II Diabetes, Gout, Osteoarthritis, Neuropathy] [N/A:N/A] Date Acquired: [1:09/12/2016] [N/A:N/A] Weeks of Treatment: [1:4] [N/A:N/A] Wound Status: [1:Open] [N/A:N/A] Measurements L x W x D [1:0.3x0.2x0.4] [N/A:N/A] (cm) Area (cm) : [1:0.047] [N/A:N/A] Volume (cm) : [1:0.019] [N/A:N/A] % Reduction in Area: [1:88.00%] [N/A:N/A] % Reduction in Volume: [1:51.30%] [  N/A:N/A] Starting Position 1 [1:12] (o'clock): Ending Position 1 [1:12] (o'clock): Maximum Distance 1 (cm): [1:0.5] Undermining: [1:Yes] [N/A:N/A] Classification: [1:Grade 2] [N/A:N/A] Exudate Amount: [1:Medium] [N/A:N/A] Exudate Type: [1:Serosanguineous] [N/A:N/A] Exudate Color: [1:red, brown] [N/A:N/A] Wound Margin: [1:Well defined, not attached] [N/A:N/A] Granulation Amount: [1:Medium (34-66%)] [N/A:N/A] Granulation Quality: [1:Red] [N/A:N/A] Necrotic Amount: [1:None Present (0%)] [N/A:N/A] Exposed Structures: [1:Fat Layer (Subcutaneous Tissue) Exposed: Yes Fascia: No Tendon: No] [N/A:N/A] Muscle: No Joint: No Bone: No Epithelialization: None N/A N/A Periwound Skin Texture: Callus: Yes N/A N/A Periwound Skin Moisture: No Abnormalities Noted N/A N/A Periwound Skin Color: No Abnormalities Noted N/A N/A Temperature: No Abnormality N/A N/A Tenderness on Palpation: No N/A N/A Wound Preparation: Ulcer Cleansing: N/A N/A Rinsed/Irrigated with Saline Topical Anesthetic  Applied: Other: liocaine 4% Treatment Notes Electronic Signature(s) Signed: 03/18/2017 4:26:47 PM By: Alejandro Mulling Entered By: Alejandro Mulling on 03/18/2017 08:21:46 Tiznado, Enzo Montgomery ([id number]) -------------------------------------------------------------------------------- Multi-Disciplinary Care Plan Details Patient Name: Sestak, Darron C. Date of Service: 03/18/2017 8:00 AM Medical Record Number: [id number] Patient Account Number: [id number] Date of Birth/Sex: 1961-01-21 (57 y.o. Male) Treating RN: Phillis Haggis Primary Care Bernarr Longsworth: Marden Noble Other Clinician: Referring Alano Blasco: Marden Noble Treating Junko Ohagan/Extender: Linwood Dibbles, HOYT Weeks in Treatment: 4 Active Inactive ` Nutrition Nursing Diagnoses: Potential for alteratiion in Nutrition/Potential for imbalanced nutrition Goals: Patient/caregiver will maintain therapeutic glucose control Date Initiated: 02/18/2017 Target Resolution Date: 03/11/2017 Goal Status: Active Interventions: Assess patient nutrition upon admission and as needed per policy Treatment Activities: Patient referred to Primary Care Physician for further nutritional evaluation : 02/18/2017 Notes: ` Orientation to the Wound Care Program Nursing Diagnoses: Knowledge deficit related to the wound healing center program Goals: Patient/caregiver will verbalize understanding of the Wound Healing Center Program Date Initiated: 02/18/2017 Target Resolution Date: 03/11/2017 Goal Status: Active Interventions: Provide education on orientation to the wound center Notes: ` Peripheral Neuropathy Nursing Diagnoses: Knowledge deficit related to disease process and management of peripheral neurovascular dysfunction Goals: Patient/caregiver will verbalize understanding of disease process and disease management Date Initiated: 02/18/2017 Target Resolution Date: 03/11/2017 MATHIEU, SCHLOEMER ([id number]) Goal Status: Active Interventions: Assess signs and  symptoms of neuropathy upon admission and as needed Provide education on Management of Neuropathy upon discharge from the Wound Center Treatment Activities: Patient referred for customized footwear/offloading : 02/18/2017 Notes: ` Pressure Nursing Diagnoses: Knowledge deficit related to management of pressures ulcers Goals: Patient/caregiver will verbalize understanding of pressure ulcer management Date Initiated: 02/18/2017 Target Resolution Date: 03/11/2017 Goal Status: Active Interventions: Provide education on pressure ulcers Treatment Activities: Pressure reduction/relief device ordered : 02/18/2017 Notes: ` Wound/Skin Impairment Nursing Diagnoses: Impaired tissue integrity Goals: Ulcer/skin breakdown will have a volume reduction of 80% by week 12 Date Initiated: 02/18/2017 Target Resolution Date: 06/11/2017 Goal Status: Active Interventions: Assess ulceration(s) every visit Treatment Activities: Skin care regimen initiated : 02/18/2017 Notes: Electronic Signature(s) Signed: 03/18/2017 4:26:47 PM By: Alejandro Mulling Entered By: Alejandro Mulling on 03/18/2017 08:21:39 Wight, Enzo Montgomery ([id number]) -------------------------------------------------------------------------------- Pain Assessment Details Patient Name: Hottel, Antwian C. Date of Service: 03/18/2017 8:00 AM Medical Record Number: [id number] Patient Account Number: [id number] Date of Birth/Sex: 1960/05/07 (57 y.o. Male) Treating RN: Phillis Haggis Primary Care Naima Veldhuizen: Marden Noble Other Clinician: Referring Jaclyn Carew: Marden Noble Treating Andoni Busch/Extender: Linwood Dibbles, HOYT Weeks in Treatment: 4 Active Problems Location of Pain Severity and Description of Pain Patient Has Paino No Site Locations Pain Management and Medication Current Pain Management: Electronic Signature(s) Signed: 03/18/2017 4:26:47 PM By: Alejandro Mulling Entered By: Alejandro Mulling on 03/18/2017 08:06:42 Terris, Jerrick Salena Saner  ([id number]) --------------------------------------------------------------------------------  Patient/Caregiver Education Details Patient Name: TOMASZ, STEEVES. Date of Service: 03/18/2017 8:00 AM Medical Record Number: [id number] Patient Account Number: [id number] Date of Birth/Gender: December 13, 1960 (57 y.o. Male) Treating RN: Phillis Haggis Primary Care Physician: Marden Noble Other Clinician: Referring Physician: Marden Noble Treating Physician/Extender: Skeet Simmer in Treatment: 4 Education Assessment Education Provided To: Patient Education Topics Provided Wound/Skin Impairment: Handouts: Caring for Your Ulcer, Other: change dressing as ordered Methods: Demonstration, Explain/Verbal Responses: State content correctly Electronic Signature(s) Signed: 03/18/2017 4:26:47 PM By: Alejandro Mulling Entered By: Alejandro Mulling on 03/18/2017 08:22:50 Minassian, Enzo Montgomery ([id number]) -------------------------------------------------------------------------------- Wound Assessment Details Patient Name: Mounts, Daimian C. Date of Service: 03/18/2017 8:00 AM Medical Record Number: [id number] Patient Account Number: [id number] Date of Birth/Sex: 09-Mar-1960 (57 y.o. Male) Treating RN: Phillis Haggis Primary Care Triton Heidrich: Marden Noble Other Clinician: Referring Arrow Emmerich: Marden Noble Treating Tina Temme/Extender: Linwood Dibbles, HOYT Weeks in Treatment: 4 Wound Status Wound Number: 1 Primary Diabetic Wound/Ulcer of the Lower Extremity Etiology: Wound Location: Right Toe Great - Plantar Secondary Pressure Ulcer Wounding Event: Gradually Appeared Etiology: Date Acquired: 09/12/2016 Wound Open Weeks Of Treatment: 4 Status: Clustered Wound: No Comorbid Sleep Apnea, Hypertension, Myocardial History: Infarction, Type II Diabetes, Gout, Osteoarthritis, Neuropathy Photos Photo Uploaded By: Alejandro Mulling on 03/18/2017 16:21:57 Wound Measurements Length: (cm) 0.3 Width: (cm) 0.2 Depth:  (cm) 0.4 Area: (cm) 0.047 Volume: (cm) 0.019 % Reduction in Area: 88% % Reduction in Volume: 51.3% Epithelialization: None Undermining: Yes Starting Position (o'clock): 12 Ending Position (o'clock): 12 Maximum Distance: (cm) 0.5 Wound Description Classification: Grade 2 Wound Margin: Well defined, not attached Exudate Amount: Medium Exudate Type: Serosanguineous Exudate Color: red, brown Foul Odor After Cleansing: No Slough/Fibrino No Wound Bed Granulation Amount: Medium (34-66%) Exposed Structure Granulation Quality: Red Fascia Exposed: No Necrotic Amount: None Present (0%) Fat Layer (Subcutaneous Tissue) Exposed: Yes Tendon Exposed: No Rolon, Gemini C. ([id number]) Muscle Exposed: No Joint Exposed: No Bone Exposed: No Periwound Skin Texture Texture Color No Abnormalities Noted: No No Abnormalities Noted: No Callus: Yes Temperature / Pain Moisture Temperature: No Abnormality No Abnormalities Noted: No Wound Preparation Ulcer Cleansing: Rinsed/Irrigated with Saline Topical Anesthetic Applied: Other: liocaine 4%, Treatment Notes Wound #1 (Right, Plantar Toe Great) 1. Cleansed with: Clean wound with Normal Saline 2. Anesthetic Topical Lidocaine 4% cream to wound bed prior to debridement 4. Dressing Applied: Other dressing (specify in notes) 5. Secondary Dressing Applied Dry Gauze Foam Kerlix/Conform 7. Secured with Tape Notes silvercel, offloading felt, Darco peg assist Electronic Signature(s) Signed: 03/18/2017 4:26:47 PM By: Alejandro Mulling Entered By: Alejandro Mulling on 03/18/2017 08:13:07 Gruwell, Enzo Montgomery ([id number]) -------------------------------------------------------------------------------- Vitals Details Patient Name: Baham, Ruel C. Date of Service: 03/18/2017 8:00 AM Medical Record Number: [id number] Patient Account Number: [id number] Date of Birth/Sex: Jul 01, 1960 (57 y.o. Male) Treating RN: Ashok Cordia, Debi Primary Care Henning Ehle: Marden Noble Other Clinician: Referring Ritaj Dullea: Marden Noble Treating Mary-Ann Pennella/Extender: Linwood Dibbles, HOYT Weeks in Treatment: 4 Vital Signs Time Taken: 08:06 Temperature (F): 97.7 Height (in): 71 Pulse (bpm): 89 Weight (lbs): 318.4 Respiratory Rate (breaths/min): 18 Body Mass Index (BMI): 44.4 Blood Pressure (mmHg): 164/84 Reference Range: 80 - 120 mg / dl Electronic Signature(s) Signed: 03/18/2017 4:26:47 PM By: Alejandro Mulling Entered By: Alejandro Mulling on 03/18/2017 08:11:02

## 2017-04-01 ENCOUNTER — Ambulatory Visit: Payer: BLUE CROSS/BLUE SHIELD | Admitting: Physician Assistant

## 2017-04-15 ENCOUNTER — Encounter (HOSPITAL_BASED_OUTPATIENT_CLINIC_OR_DEPARTMENT_OTHER): Payer: BLUE CROSS/BLUE SHIELD

## 2017-05-02 ENCOUNTER — Other Ambulatory Visit: Payer: Self-pay

## 2017-05-02 MED ORDER — RANITIDINE HCL 150 MG PO TABS: 150.0000 mg | ORAL_TABLET | Freq: Two times a day (BID) | ORAL | 2 refills | Status: DC

## 2017-05-03 DIAGNOSIS — J309 Allergic rhinitis, unspecified: Secondary | ICD-10-CM | POA: Diagnosis not present

## 2017-05-03 DIAGNOSIS — J4520 Mild intermittent asthma, uncomplicated: Secondary | ICD-10-CM | POA: Diagnosis not present

## 2017-05-03 DIAGNOSIS — K222 Esophageal obstruction: Secondary | ICD-10-CM | POA: Diagnosis not present

## 2017-05-03 DIAGNOSIS — B349 Viral infection, unspecified: Secondary | ICD-10-CM | POA: Diagnosis not present

## 2017-05-22 DIAGNOSIS — L97519 Non-pressure chronic ulcer of other part of right foot with unspecified severity: Secondary | ICD-10-CM | POA: Diagnosis not present

## 2017-05-24 ENCOUNTER — Ambulatory Visit: Payer: BLUE CROSS/BLUE SHIELD | Admitting: Sports Medicine

## 2017-05-24 ENCOUNTER — Encounter: Payer: Self-pay | Admitting: Sports Medicine

## 2017-05-24 DIAGNOSIS — L97511 Non-pressure chronic ulcer of other part of right foot limited to breakdown of skin: Secondary | ICD-10-CM | POA: Diagnosis not present

## 2017-05-24 DIAGNOSIS — E1140 Type 2 diabetes mellitus with diabetic neuropathy, unspecified: Secondary | ICD-10-CM | POA: Diagnosis not present

## 2017-05-24 NOTE — Progress Notes (Signed)
Subjective: [name] is a 57 y.o. male patient with history of diabetes who presents to office today for evaluation of right great toe ulceration.  Patient reports that he has stopped going to the wound care center because each visit was $800 states that he has been caring for his wound occasionally with antibiotic cream or a alginate dressing states that there has been clear fluid draining from the area but no pain he lacks sensation due to neuropathy.  Patient reports that his last blood sugar was 260 and that his last A1c was 7.4 and saw his primary care doctor Kevan NyGates 2 months ago.  Denies nausea, vomiting, fever, chills or any other constitutional symptoms at this time.  No other issues noted.  Patient Active Problem List   Diagnosis Date Noted  . GERD (gastroesophageal reflux disease) 12/27/2015  . Arthritis 11/16/2015  . Diabetes mellitus (HCC) 11/16/2015  . Hyperlipidemia 11/16/2015  . Localized edema 11/16/2015  . OBESITY 05/15/2007  . ADD 05/15/2007  . HYPERTENSION 05/15/2007  . Allergic rhinitis 05/15/2007  . SLEEP APNEA 05/15/2007  . COUGH 05/15/2007   Current Outpatient Medications on File Prior to Visit  Medication Sig Dispense Refill  . aspirin EC 81 MG tablet Take 81 mg by mouth daily.    . Cetirizine HCl (ZYRTEC ALLERGY PO) Take 10 mg by mouth daily.    Marland Kitchen. glimepiride (AMARYL) 4 MG tablet Take 4 mg by mouth daily with breakfast.     . insulin NPH-regular Human (NOVOLIN 70/30) (70-30) 100 UNIT/ML injection Inject 40 Units into the skin 2 (two) times daily with a meal.    . metFORMIN (GLUCOPHAGE-XR) 500 MG 24 hr tablet Take 1,000 mg by mouth 2 (two) times daily.     . montelukast (SINGULAIR) 10 MG tablet TAKE 1 TABLET BY MOUTH AT BEDTIME 30 tablet 3  . pantoprazole (PROTONIX) 40 MG tablet TAKE 1 TABLET BY MOUTH TWICE DAILY 60 tablet 3  . ranitidine (ZANTAC) 150 MG tablet Take 1 tablet (150 mg total) by mouth 2 (two) times daily. 60 tablet 2  . Spacer/Aero-Holding  Chambers (AEROCHAMBER MV) inhaler Use as instructed 1 each 0   No current facility-administered medications on file prior to visit.    No Known Allergies  No results found for this or any previous visit (from the past 2160 hour(s)).  Objective: General: Patient is awake, alert, and oriented x 3 and in no acute distress.  Integument: Skin is warm, dry and supple bilateral. Nails are short thickened and  dystrophic with subungual debris, consistent with onychomycosis, 1-5 on left and 2-5 on right. Right plantar hallux toe ulceration measures 1 x 1.5 cm with fibro granular base with faint malodor and clear drainage. +callus distal tuft of right 2nd toe. Remaining integument unremarkable.  Vasculature:  Dorsalis Pedis pulse 1/4 bilateral. Posterior Tibial pulse  1/4 bilateral.  Capillary fill time <3 sec 1-5 bilateral. Positive Doppler ultrasound of biphasic waves.  Minimal hair growth to the level of the digits.Temperature gradient within normal limits. No varicosities present bilateral. No edema present bilateral.   Neurology: The patient has absent sensation measured with a 5.07/10g Semmes Weinstein Monofilament at all pedal sites bilateral . Vibratory sensation diminished bilateral with tuning fork. No Babinski sign present bilateral.   Musculoskeletal: No tenderness to palpation to the ulceration site. There is digital contracture.  Assessment and Plan: Problem List Items Addressed This Visit      Endocrine   Diabetes mellitus (HCC)    Other Visit  Diagnoses    Foot ulcer, right, limited to breakdown of skin (HCC)    -  Primary   Relevant Orders   WOUND CULTURE     -Examined patient. -Discussed and educated patient on diabetic foot care, especially with  regards to the vascular, neurological and musculoskeletal systems.  -Stressed the importance of good glycemic control and the detriment of not  controlling glucose levels in relation to the foot. - Excisionally dedbrided  ulceration at right great toe to healthy bleeding borders removing nonviable tissue using a sterile chisel blade. Wound measures post debridement as above wound was debrided to the level of the dermis with viable wound base exposed to promote healing. Hemostasis was achieved with manuel pressure. Patient tolerated procedure well without any discomfort or anesthesia necessary for this wound debridement.  Wound culture was obtained for surveillance of the wound and advised patient we will call him with the culture results to determine what additional antibiotics he may need to be on at this time. -Applied triple antibiotic and dry sterile dressing and instructed patient to continue with daily dressings at home consisting of the same with offloading padding - Advised patient to go to the ER or return to office if the wound worsens or if constitutional symptoms are present. -Answered all patient questions -Return to office in 2 weeks for follow-up wound care and for discussion of a more thorough plan to assist with caring for this wound since wound has been present almost 1 year -Patient advised to call the office if any problems or questions arise in the meantime.  [name], DPM

## 2017-05-27 ENCOUNTER — Telehealth: Payer: Self-pay

## 2017-05-27 LAB — WOUND CULTURE

## 2017-05-27 NOTE — Telephone Encounter (Signed)
Spoke with patient in forming him of wound culture results. No antibiotic needed at this time due to result of normal skin flora, negative culture.

## 2017-05-27 NOTE — Telephone Encounter (Signed)
-----   Message from [name], [place] sent at 05/27/2017  2:26 PM EDT ----- Will you let patient know that culture results from his wound showed normal skin flora/baceteria. NO need for any oral antibiotics at this time -Dr. SKathie Rhodes

## 2017-06-07 ENCOUNTER — Ambulatory Visit: Payer: BLUE CROSS/BLUE SHIELD | Admitting: Sports Medicine

## 2017-06-07 ENCOUNTER — Encounter: Payer: Self-pay | Admitting: Sports Medicine

## 2017-06-07 DIAGNOSIS — L97511 Non-pressure chronic ulcer of other part of right foot limited to breakdown of skin: Secondary | ICD-10-CM | POA: Diagnosis not present

## 2017-06-07 DIAGNOSIS — E1140 Type 2 diabetes mellitus with diabetic neuropathy, unspecified: Secondary | ICD-10-CM

## 2017-06-07 NOTE — Progress Notes (Signed)
Subjective: [name] is a 57 y.o. male patient with history of diabetes who presents to office today for follow up evaluation of right great toe ulceration.  Patient reports wound is going good and looks like its healing.  Denies nausea, vomiting, fever, chills or any other constitutional symptoms at this time.  No other issues noted.  Patient Active Problem List   Diagnosis Date Noted  . GERD (gastroesophageal reflux disease) 12/27/2015  . Arthritis 11/16/2015  . Diabetes mellitus (HCC) 11/16/2015  . Hyperlipidemia 11/16/2015  . Localized edema 11/16/2015  . OBESITY 05/15/2007  . ADD 05/15/2007  . HYPERTENSION 05/15/2007  . Allergic rhinitis 05/15/2007  . SLEEP APNEA 05/15/2007  . COUGH 05/15/2007   Current Outpatient Medications on File Prior to Visit  Medication Sig Dispense Refill  . aspirin EC 81 MG tablet Take 81 mg by mouth daily.    . Cetirizine HCl (ZYRTEC ALLERGY PO) Take 10 mg by mouth daily.    Marland Kitchen glimepiride (AMARYL) 4 MG tablet Take 4 mg by mouth daily with breakfast.     . insulin NPH-regular Human (NOVOLIN 70/30) (70-30) 100 UNIT/ML injection Inject 40 Units into the skin 2 (two) times daily with a meal.    . metFORMIN (GLUCOPHAGE-XR) 500 MG 24 hr tablet Take 1,000 mg by mouth 2 (two) times daily.     . montelukast (SINGULAIR) 10 MG tablet TAKE 1 TABLET BY MOUTH AT BEDTIME 30 tablet 3  . pantoprazole (PROTONIX) 40 MG tablet TAKE 1 TABLET BY MOUTH TWICE DAILY 60 tablet 3  . ranitidine (ZANTAC) 150 MG tablet Take 1 tablet (150 mg total) by mouth 2 (two) times daily. 60 tablet 2  . Spacer/Aero-Holding Chambers (AEROCHAMBER MV) inhaler Use as instructed 1 each 0   No current facility-administered medications on file prior to visit.    No Known Allergies  Recent Results (from the past 2160 hour(s))  WOUND CULTURE     Status: None   Collection Time: 05/24/17 11:59 AM  Result Value Ref Range   Gram Stain Result Final report    Organism ID, Bacteria Comment    Comment: Rare white blood cells.   Organism ID, Bacteria Comment     Comment: Rare gram negative diplococci   Aerobic Bacterial Culture Final report    Organism ID, Bacteria Mixed skin flora     Comment: Light growth    Objective: General: Patient is awake, alert, and oriented x 3 and in no acute distress.  Integument: Skin is warm, dry and supple bilateral. Nails are short thickened and  dystrophic with subungual debris, consistent with onychomycosis, 1-5 on left and 2-5 on right. Right plantar hallux toe ulceration measures 0.3 x 0.9x0.2 cm, smaller than previous, with fibro granular base and reactive keratosis surrounding, +callus distal tuft of right 2nd toe. Remaining integument unremarkable.  Vasculature:  Dorsalis Pedis pulse 1/4 bilateral. Posterior Tibial pulse  1/4 bilateral.  Capillary fill time <3 sec 1-5 bilateral. Positive Doppler ultrasound of biphasic waves.  Minimal hair growth to the level of the digits.Temperature gradient within normal limits. No varicosities present bilateral. No edema present bilateral.   Neurology: The patient has absent sensation measured with a 5.07/10g Semmes Weinstein Monofilament at all pedal sites bilateral . Vibratory sensation diminished bilateral with tuning fork. No Babinski sign present bilateral.   Musculoskeletal: No tenderness to palpation to the ulceration site. There is digital contracture.  Assessment and Plan: Problem List Items Addressed This Visit      Endocrine  Diabetes mellitus (HCC)    Other Visit Diagnoses    Foot ulcer, right, limited to breakdown of skin (HCC)    -  Primary     -Examined patient. -Discussed and educated patient on diabetic foot care, especially with  regards to the vascular, neurological and musculoskeletal systems.  -Stressed the importance of good glycemic control and the detriment of not  controlling glucose levels in relation to the foot. - Excisionally dedbrided ulceration at right great toe  to healthy bleeding borders removing nonviable tissue using a sterile chisel blade. Wound measures post debridement as above wound was debrided to the level of the dermis with viable wound base exposed to promote healing. Hemostasis was achieved with manuel pressure. Patient tolerated procedure well without any discomfort or anesthesia necessary for this wound debridement.   -Applied PRISMA and dry sterile dressing and instructed patient to continue with daily dressings at home consisting of the same with offloading padding - Advised patient to go to the ER or return to office if the wound worsens or if constitutional symptoms are present. -Answered all patient questions -Return to office in 3 weeks for follow up wound care -Patient advised to call the office if any problems or questions arise in the meantime.  Asencion Islamitorya Jerrid Forgette, DPM

## 2017-06-27 DIAGNOSIS — E1140 Type 2 diabetes mellitus with diabetic neuropathy, unspecified: Secondary | ICD-10-CM | POA: Diagnosis not present

## 2017-06-27 DIAGNOSIS — E119 Type 2 diabetes mellitus without complications: Secondary | ICD-10-CM | POA: Diagnosis not present

## 2017-06-27 DIAGNOSIS — G4730 Sleep apnea, unspecified: Secondary | ICD-10-CM | POA: Diagnosis not present

## 2017-06-28 ENCOUNTER — Encounter: Payer: Self-pay | Admitting: Sports Medicine

## 2017-06-28 ENCOUNTER — Encounter: Payer: Self-pay | Admitting: *Deleted

## 2017-06-28 ENCOUNTER — Ambulatory Visit (INDEPENDENT_AMBULATORY_CARE_PROVIDER_SITE_OTHER): Payer: BLUE CROSS/BLUE SHIELD | Admitting: Sports Medicine

## 2017-06-28 DIAGNOSIS — E1140 Type 2 diabetes mellitus with diabetic neuropathy, unspecified: Secondary | ICD-10-CM | POA: Diagnosis not present

## 2017-06-28 DIAGNOSIS — L97511 Non-pressure chronic ulcer of other part of right foot limited to breakdown of skin: Secondary | ICD-10-CM

## 2017-06-28 NOTE — Progress Notes (Signed)
Subjective: [name] is a 57 y.o. male patient with history of diabetes who presents to office today for follow up evaluation of right great toe ulceration.  Patient reports wound is going good and that he has been compliant with dressing the area daily with Prisma and being sure to keep the area protected when taking a bath or or shower with a shower protector.  Denies nausea, vomiting, fever, chills or any other constitutional symptoms at this time.  No other issues noted.  Patient Active Problem List   Diagnosis Date Noted  . GERD (gastroesophageal reflux disease) 12/27/2015  . Arthritis 11/16/2015  . Diabetes mellitus (HCC) 11/16/2015  . Hyperlipidemia 11/16/2015  . Localized edema 11/16/2015  . OBESITY 05/15/2007  . ADD 05/15/2007  . HYPERTENSION 05/15/2007  . Allergic rhinitis 05/15/2007  . SLEEP APNEA 05/15/2007  . COUGH 05/15/2007   Current Outpatient Medications on File Prior to Visit  Medication Sig Dispense Refill  . aspirin EC 81 MG tablet Take 81 mg by mouth daily.    . Cetirizine HCl (ZYRTEC ALLERGY PO) Take 10 mg by mouth daily.    Marland Kitchen glimepiride (AMARYL) 4 MG tablet Take 4 mg by mouth daily with breakfast.     . insulin NPH-regular Human (NOVOLIN 70/30) (70-30) 100 UNIT/ML injection Inject 40 Units into the skin 2 (two) times daily with a meal.    . metFORMIN (GLUCOPHAGE-XR) 500 MG 24 hr tablet Take 1,000 mg by mouth 2 (two) times daily.     . montelukast (SINGULAIR) 10 MG tablet TAKE 1 TABLET BY MOUTH AT BEDTIME 30 tablet 3  . pantoprazole (PROTONIX) 40 MG tablet TAKE 1 TABLET BY MOUTH TWICE DAILY 60 tablet 3  . ranitidine (ZANTAC) 150 MG tablet Take 1 tablet (150 mg total) by mouth 2 (two) times daily. 60 tablet 2  . Spacer/Aero-Holding Chambers (AEROCHAMBER MV) inhaler Use as instructed 1 each 0   No current facility-administered medications on file prior to visit.    No Known Allergies  Recent Results (from the past 2160 hour(s))  WOUND CULTURE     Status:  None   Collection Time: 05/24/17 11:59 AM  Result Value Ref Range   Gram Stain Result Final report    Organism ID, Bacteria Comment     Comment: Rare white blood cells.   Organism ID, Bacteria Comment     Comment: Rare gram negative diplococci   Aerobic Bacterial Culture Final report    Organism ID, Bacteria Mixed skin flora     Comment: Light growth    Objective: General: Patient is awake, alert, and oriented x 3 and in no acute distress.  Integument: Skin is warm, dry and supple bilateral. Nails are short thickened and  dystrophic with subungual debris, consistent with onychomycosis, 1-5 on left and 2-5 on right. Right plantar hallux toe ulceration measures 0.4 x 0.9x0.2 cm, smiliar to previous, with 100% granular base and reactive macerated keratosis surrounding, +callus distal tuft of right 2nd toe. Remaining integument unremarkable.  Vasculature:  Dorsalis Pedis pulse 1/4 bilateral. Posterior Tibial pulse  1/4 bilateral.  Capillary fill time <3 sec 1-5 bilateral.  Minimal hair growth to the level of the digits.Temperature gradient within normal limits. No varicosities present bilateral. No edema present bilateral.   Neurology: The patient has absent sensation measured with a 5.07/10g Semmes Weinstein Monofilament at all pedal sites bilateral . Vibratory sensation diminished bilateral with tuning fork. No Babinski sign present bilateral.   Musculoskeletal: No tenderness to palpation to the  ulceration site. There is digital contracture.  Assessment and Plan: Problem List Items Addressed This Visit      Endocrine   Diabetes mellitus (HCC)    Other Visit Diagnoses    Foot ulcer, right, limited to breakdown of skin (HCC)    -  Primary     -Examined patient. -Discussed and educated patient on diabetic foot care, especially with  regards to the vascular, neurological and musculoskeletal systems.  -Stressed the importance of good glycemic control and the detriment of not   controlling glucose levels in relation to the foot. - Excisionally dedbrided ulceration at right great toe to healthy bleeding borders removing nonviable tissue using a sterile chisel blade. Wound measures post debridement as above wound was debrided to the level of the dermis with viable wound base exposed to promote healing. Hemostasis was achieved with manuel pressure. Patient tolerated procedure well without any discomfort or anesthesia necessary for this wound debridement.   -Applied PRISMA and dry sterile dressing and instructed patient to continue with daily dressings at home consisting of the same with offloading padding - Advised patient to go to the ER or return to office if the wound worsens or if constitutional symptoms are present. -Answered all patient questions -Return to office in 3 weeks for follow up wound care.  We will consider changing to medihoney depending on the presence of the wound at next visit. -Patient advised to call the office if any problems or questions arise in the meantime.  Advised patient to call office if there is any concerns while he is away on vacation in [place] and encouraged patient to remain consistent and compliant while on vacation with dressing changes and to avoid ocean and pools that could cause increased risk for bacterial infection.  [name], DPM

## 2017-07-19 ENCOUNTER — Encounter: Payer: Self-pay | Admitting: Sports Medicine

## 2017-07-19 ENCOUNTER — Ambulatory Visit (INDEPENDENT_AMBULATORY_CARE_PROVIDER_SITE_OTHER): Payer: BLUE CROSS/BLUE SHIELD | Admitting: Sports Medicine

## 2017-07-19 VITALS — BP 179/99 | HR 84 | Temp 97.5°F | Resp 16 | Ht 71.0 in | Wt 300.0 lb

## 2017-07-19 DIAGNOSIS — L97511 Non-pressure chronic ulcer of other part of right foot limited to breakdown of skin: Secondary | ICD-10-CM | POA: Diagnosis not present

## 2017-07-19 DIAGNOSIS — E1140 Type 2 diabetes mellitus with diabetic neuropathy, unspecified: Secondary | ICD-10-CM

## 2017-07-19 NOTE — Progress Notes (Signed)
Subjective: [name] is a 57 y.o. male patient with history of diabetes who returns to office today for follow up evaluation of right great toe ulceration.  Patient reports wound is going good but not sure if it has healed much more than last time. Patient  has been compliant with dressing the area daily with Prisma and being sure to keep the area protected when taking a bath or or shower. Admits 1 episode of it getting wet. Denies nausea, vomiting, fever, chills or any other constitutional symptoms at this time.  No other issues noted.  Patient Active Problem List   Diagnosis Date Noted  . GERD (gastroesophageal reflux disease) 12/27/2015  . Arthritis 11/16/2015  . Diabetes mellitus (HCC) 11/16/2015  . Hyperlipidemia 11/16/2015  . Localized edema 11/16/2015  . OBESITY 05/15/2007  . ADD 05/15/2007  . HYPERTENSION 05/15/2007  . Allergic rhinitis 05/15/2007  . SLEEP APNEA 05/15/2007  . COUGH 05/15/2007   Current Outpatient Medications on File Prior to Visit  Medication Sig Dispense Refill  . aspirin EC 81 MG tablet Take 81 mg by mouth daily.    . Cetirizine HCl (ZYRTEC ALLERGY PO) Take 10 mg by mouth daily.    Marland Kitchen glimepiride (AMARYL) 4 MG tablet Take 4 mg by mouth daily with breakfast.     . insulin NPH-regular Human (NOVOLIN 70/30) (70-30) 100 UNIT/ML injection Inject 40 Units into the skin 2 (two) times daily with a meal.    . metFORMIN (GLUCOPHAGE-XR) 500 MG 24 hr tablet Take 1,000 mg by mouth 2 (two) times daily.     . montelukast (SINGULAIR) 10 MG tablet TAKE 1 TABLET BY MOUTH AT BEDTIME 30 tablet 3  . pantoprazole (PROTONIX) 40 MG tablet TAKE 1 TABLET BY MOUTH TWICE DAILY 60 tablet 3  . ranitidine (ZANTAC) 150 MG tablet Take 1 tablet (150 mg total) by mouth 2 (two) times daily. 60 tablet 2  . Spacer/Aero-Holding Chambers (AEROCHAMBER MV) inhaler Use as instructed 1 each 0   No current facility-administered medications on file prior to visit.    No Known Allergies  Recent  Results (from the past 2160 hour(s))  WOUND CULTURE     Status: None   Collection Time: 05/24/17 11:59 AM  Result Value Ref Range   Gram Stain Result Final report    Organism ID, Bacteria Comment     Comment: Rare white blood cells.   Organism ID, Bacteria Comment     Comment: Rare gram negative diplococci   Aerobic Bacterial Culture Final report    Organism ID, Bacteria Mixed skin flora     Comment: Light growth    Objective: General: Patient is awake, alert, and oriented x 3 and in no acute distress.  Integument: Skin is warm, dry and supple bilateral. Nails are short thickened and  dystrophic with subungual debris, consistent with onychomycosis, 1-5 on left and 2-5 on right. Right plantar hallux toe ulceration measures 0.4 x 0.6x0.2 cm, smaller than previous, with 100% granular base and reactive macerated keratosis surrounding, +callus distal tuft of right 2nd toe. Remaining integument unremarkable.  Vasculature:  Dorsalis Pedis pulse 1/4 bilateral. Posterior Tibial pulse  1/4 bilateral.  Capillary fill time <3 sec 1-5 bilateral.  Minimal hair growth to the level of the digits.Temperature gradient within normal limits. No varicosities present bilateral. No edema present bilateral.   Neurology: The patient has absent sensation measured with a 5.07/10g Semmes Weinstein Monofilament at all pedal sites bilateral . Vibratory sensation diminished bilateral with tuning fork. No  Babinski sign present bilateral.   Musculoskeletal: No tenderness to palpation to the ulceration site. There is digital contracture.  Assessment and Plan: Problem List Items Addressed This Visit      Endocrine   Diabetes mellitus (HCC)    Other Visit Diagnoses    Foot ulcer, right, limited to breakdown of skin (HCC)    -  Primary     -Examined patient. -Re-Discussed and re-educated patient on diabetic foot care, especially with  regards to the vascular, neurological and musculoskeletal systems.  -Stressed  the importance of good glycemic control and the detriment of not  controlling glucose levels in relation to the foot. - Excisionally dedbrided ulceration at right great toe to healthy bleeding borders removing nonviable tissue using a sterile chisel blade. Wound measures post debridement as above wound was debrided to the level of the dermis with viable wound base exposed to promote healing. Hemostasis was achieved with manuel pressure. Patient tolerated procedure well without any discomfort or anesthesia necessary for this wound debridement.   -Applied PRISMA and dry sterile dressing and instructed patient to continue with daily dressings at home consisting of the same with offloading padding as previous. May leave open to air at bedtime to decrease maceration/excessive moisture - Advised patient to go to the ER or return to office if the wound worsens or if constitutional symptoms are present. -Answered all patient questions -Return to office in 3 weeks for follow up wound care.    Asencion Islamitorya Bastien Strawser, DPM

## 2017-07-30 ENCOUNTER — Other Ambulatory Visit: Payer: Self-pay | Admitting: Acute Care

## 2017-08-02 DIAGNOSIS — E113411 Type 2 diabetes mellitus with severe nonproliferative diabetic retinopathy with macular edema, right eye: Secondary | ICD-10-CM | POA: Diagnosis not present

## 2017-08-02 DIAGNOSIS — E113412 Type 2 diabetes mellitus with severe nonproliferative diabetic retinopathy with macular edema, left eye: Secondary | ICD-10-CM | POA: Diagnosis not present

## 2017-08-02 DIAGNOSIS — H3561 Retinal hemorrhage, right eye: Secondary | ICD-10-CM | POA: Diagnosis not present

## 2017-08-02 DIAGNOSIS — H2511 Age-related nuclear cataract, right eye: Secondary | ICD-10-CM | POA: Diagnosis not present

## 2017-08-08 DIAGNOSIS — E113412 Type 2 diabetes mellitus with severe nonproliferative diabetic retinopathy with macular edema, left eye: Secondary | ICD-10-CM | POA: Diagnosis not present

## 2017-08-08 DIAGNOSIS — E113411 Type 2 diabetes mellitus with severe nonproliferative diabetic retinopathy with macular edema, right eye: Secondary | ICD-10-CM | POA: Diagnosis not present

## 2017-08-09 ENCOUNTER — Encounter: Payer: Self-pay | Admitting: Sports Medicine

## 2017-08-09 ENCOUNTER — Ambulatory Visit: Payer: BLUE CROSS/BLUE SHIELD | Admitting: Sports Medicine

## 2017-08-09 VITALS — BP 157/90 | HR 88 | Temp 98.9°F | Resp 16

## 2017-08-09 DIAGNOSIS — E119 Type 2 diabetes mellitus without complications: Secondary | ICD-10-CM | POA: Diagnosis not present

## 2017-08-09 DIAGNOSIS — L97511 Non-pressure chronic ulcer of other part of right foot limited to breakdown of skin: Secondary | ICD-10-CM | POA: Diagnosis not present

## 2017-08-09 DIAGNOSIS — E1140 Type 2 diabetes mellitus with diabetic neuropathy, unspecified: Secondary | ICD-10-CM

## 2017-08-09 DIAGNOSIS — M25572 Pain in left ankle and joints of left foot: Secondary | ICD-10-CM | POA: Diagnosis not present

## 2017-08-09 NOTE — Progress Notes (Signed)
Subjective: [name] is a 57 y.o. male patient with history of diabetes who returns to office today for follow up evaluation of right great toe ulceration.  Patient reports wound is ok but not sure if it has healed much. Patient  has been compliant with dressing the area daily with Prisma.  Denies nausea, vomiting, fever, chills or any other constitutional symptoms at this time.  Admits that he is going to see an ortho doctor about his left foot/leg pain felt a pop a few days ago. Also admits to increased numbness to both feet over the last few days as well and was recently diagnosed with eye changes secondary to diabetes and almost near blind. No other issues noted.  Fasting blood sugar not recorded for today.  Patient Active Problem List   Diagnosis Date Noted  . GERD (gastroesophageal reflux disease) 12/27/2015  . Arthritis 11/16/2015  . Diabetes mellitus (HCC) 11/16/2015  . Hyperlipidemia 11/16/2015  . Localized edema 11/16/2015  . OBESITY 05/15/2007  . ADD 05/15/2007  . HYPERTENSION 05/15/2007  . Allergic rhinitis 05/15/2007  . SLEEP APNEA 05/15/2007  . COUGH 05/15/2007   Current Outpatient Medications on File Prior to Visit  Medication Sig Dispense Refill  . aspirin EC 81 MG tablet Take 81 mg by mouth daily.    . Cetirizine HCl (ZYRTEC ALLERGY PO) Take 10 mg by mouth daily.    Marland Kitchen glimepiride (AMARYL) 4 MG tablet Take 4 mg by mouth daily with breakfast.     . insulin NPH-regular Human (NOVOLIN 70/30) (70-30) 100 UNIT/ML injection Inject 40 Units into the skin 2 (two) times daily with a meal.    . metFORMIN (GLUCOPHAGE-XR) 500 MG 24 hr tablet Take 1,000 mg by mouth 2 (two) times daily.     . montelukast (SINGULAIR) 10 MG tablet TAKE 1 TABLET BY MOUTH AT BEDTIME 30 tablet 3  . pantoprazole (PROTONIX) 40 MG tablet TAKE 1 TABLET BY MOUTH TWICE DAILY 60 tablet 3  . ranitidine (ZANTAC) 150 MG tablet Take 1 tablet (150 mg total) by mouth 2 (two) times daily. 60 tablet 2  .  Spacer/Aero-Holding Chambers (AEROCHAMBER MV) inhaler Use as instructed 1 each 0   No current facility-administered medications on file prior to visit.    No Known Allergies  Recent Results (from the past 2160 hour(s))  WOUND CULTURE     Status: None   Collection Time: 05/24/17 11:59 AM  Result Value Ref Range   Gram Stain Result Final report    Organism ID, Bacteria Comment     Comment: Rare white blood cells.   Organism ID, Bacteria Comment     Comment: Rare gram negative diplococci   Aerobic Bacterial Culture Final report    Organism ID, Bacteria Mixed skin flora     Comment: Light growth    Objective: General: Patient is awake, alert, and oriented x 3 and in no acute distress.  Integument: Skin is warm, dry and supple bilateral. Nails are short thickened and  dystrophic with subungual debris, consistent with onychomycosis, 1-5 on left and 2-5 on right. Right plantar hallux toe ulceration measures 1 x 0.3x0.2 cm, larger than previous, with 100% granular base and reactive macerated keratosis surrounding, +callus distal tuft of right 2nd toe. Remaining integument unremarkable.  Vasculature:  Dorsalis Pedis pulse 1/4 bilateral. Posterior Tibial pulse  1/4 bilateral.  Capillary fill time <3 sec 1-5 bilateral.  Minimal hair growth to the level of the digits.Temperature gradient within normal limits. No varicosities present bilateral.  No edema present bilateral.   Neurology: The patient has absent sensation measured with a 5.07/10g Semmes Weinstein Monofilament at all pedal sites bilateral . Vibratory sensation diminished bilateral with tuning fork. No Babinski sign present bilateral.   Musculoskeletal: No tenderness to palpation to the ulceration site. There is digital contracture.   Assessment and Plan: Problem List Items Addressed This Visit      Endocrine   Diabetes mellitus (HCC)    Other Visit Diagnoses    Foot ulcer, right, limited to breakdown of skin (HCC)    -  Primary    Encounter for comprehensive diabetic foot examination, type 2 diabetes mellitus (HCC)         -Examined patient. -Re-Discussed and re-educated patient on diabetic foot care, especially with  regards to the vascular, neurological and musculoskeletal systems.  -Stressed the importance of good glycemic control and the detriment of not  controlling glucose levels in relation to the foot. - Excisionally dedbrided ulceration at right great toe to healthy bleeding borders removing nonviable tissue using a sterile chisel blade. Wound measures post debridement as above wound was debrided to the level of the dermis with viable wound base exposed to promote healing. Hemostasis was achieved with manuel pressure. Patient tolerated procedure well without any discomfort or anesthesia necessary for this wound debridement.   -Applied PRISMA and dry sterile dressing and instructed patient to continue with daily dressings at home consisting of the same with offloading padding as previous. Will check Insurance benefits to see if we can get approval for Epifix - Advised patient to go to the ER or return to office if the wound worsens or if constitutional symptoms are present. -Answered all patient questions -Return to office in 2 weeks for follow [place] in [place] and for in office ABIs.    Asencion Islamitorya Danira Nylander, DPM

## 2017-08-13 ENCOUNTER — Telehealth: Payer: Self-pay | Admitting: *Deleted

## 2017-08-13 DIAGNOSIS — E113412 Type 2 diabetes mellitus with severe nonproliferative diabetic retinopathy with macular edema, left eye: Secondary | ICD-10-CM | POA: Diagnosis not present

## 2017-08-13 NOTE — Telephone Encounter (Signed)
-----   Message from Asencion Islamitorya [name], [place] sent at 08/09/2017  4:32 PM EDT ----- Regarding: Insurance verification Epifix Epifix 24mm disc -Dr. Marylene LandStover

## 2017-08-13 NOTE — Telephone Encounter (Signed)
Faxed required form, clinicals and demographics to [employer]. Emailed notice of initiation to M. [place] - [employer].

## 2017-08-14 DIAGNOSIS — E11319 Type 2 diabetes mellitus with unspecified diabetic retinopathy without macular edema: Secondary | ICD-10-CM | POA: Diagnosis not present

## 2017-08-14 NOTE — Telephone Encounter (Signed)
[employer]-EpiFix IVR received, and reviewed by V. Sara LeeHill - Insurance Coordinator, she states pt will need to pay $800.00 of the remaining Deductible and then $200.00/application.

## 2017-08-14 NOTE — Telephone Encounter (Signed)
Yes I will talk with the patient at his next visit thanks

## 2017-08-20 ENCOUNTER — Encounter: Payer: Self-pay | Admitting: Sports Medicine

## 2017-08-20 ENCOUNTER — Ambulatory Visit: Payer: BLUE CROSS/BLUE SHIELD | Admitting: Sports Medicine

## 2017-08-20 DIAGNOSIS — I739 Peripheral vascular disease, unspecified: Secondary | ICD-10-CM

## 2017-08-20 DIAGNOSIS — L97511 Non-pressure chronic ulcer of other part of right foot limited to breakdown of skin: Secondary | ICD-10-CM

## 2017-08-20 DIAGNOSIS — E1140 Type 2 diabetes mellitus with diabetic neuropathy, unspecified: Secondary | ICD-10-CM

## 2017-08-20 NOTE — Progress Notes (Signed)
Subjective: [name] is a 57 y.o. male patient with history of diabetes who returns to office today for follow up evaluation of right great toe ulceration and in [facility] for ABI testing.  Patient reports wound is ok and seems to have not changed much since last visit. Reports that he is doing better with his blood sugars. Denies constitutional symptoms. No other issues noted.  Patient Active Problem List   Diagnosis Date Noted  . GERD (gastroesophageal reflux disease) 12/27/2015  . Arthritis 11/16/2015  . Diabetes mellitus (HCC) 11/16/2015  . Hyperlipidemia 11/16/2015  . Localized edema 11/16/2015  . OBESITY 05/15/2007  . ADD 05/15/2007  . HYPERTENSION 05/15/2007  . Allergic rhinitis 05/15/2007  . SLEEP APNEA 05/15/2007  . COUGH 05/15/2007   Current Outpatient Medications on File Prior to Visit  Medication Sig Dispense Refill  . aspirin EC 81 MG tablet Take 81 mg by mouth daily.    . Cetirizine HCl (ZYRTEC ALLERGY PO) Take 10 mg by mouth daily.    Marland Kitchen glimepiride (AMARYL) 4 MG tablet Take 4 mg by mouth daily with breakfast.     . insulin NPH-regular Human (NOVOLIN 70/30) (70-30) 100 UNIT/ML injection Inject 40 Units into the skin 2 (two) times daily with a meal.    . metFORMIN (GLUCOPHAGE-XR) 500 MG 24 hr tablet Take 1,000 mg by mouth 2 (two) times daily.     . montelukast (SINGULAIR) 10 MG tablet TAKE 1 TABLET BY MOUTH AT BEDTIME 30 tablet 3  . pantoprazole (PROTONIX) 40 MG tablet TAKE 1 TABLET BY MOUTH TWICE DAILY 60 tablet 3  . ranitidine (ZANTAC) 150 MG tablet Take 1 tablet (150 mg total) by mouth 2 (two) times daily. 60 tablet 2  . Spacer/Aero-Holding Chambers (AEROCHAMBER MV) inhaler Use as instructed 1 each 0   No current facility-administered medications on file prior to visit.    No Known Allergies  Recent Results (from the past 2160 hour(s))  WOUND CULTURE     Status: None   Collection Time: 05/24/17 11:59 AM  Result Value Ref Range   Gram Stain Result Final  report    Organism ID, Bacteria Comment     Comment: Rare white blood cells.   Organism ID, Bacteria Comment     Comment: Rare gram negative diplococci   Aerobic Bacterial Culture Final report    Organism ID, Bacteria Mixed skin flora     Comment: Light growth    Objective: General: Patient is awake, alert, and oriented x 3 and in no acute distress.  Integument: Skin is warm, dry and supple bilateral. Nails are short thickened and  dystrophic with subungual debris, consistent with onychomycosis, 1-5 on left and 2-5 on right. Right plantar hallux toe ulceration measures 0.7 x 0.3x0.3 cm, smaller than previous, with 100% granular base and reactive macerated keratosis surrounding, + minimal callus distal tuft of right 2nd toe. Remaining integument unremarkable.  Vasculature:  Dorsalis Pedis pulse 1/4 bilateral. Posterior Tibial pulse  1/4 bilateral.  Capillary fill time <3 sec 1-5 bilateral.  Minimal hair growth to the level of the digits.Temperature gradient within normal limits. No varicosities present bilateral. No edema present bilateral.   Neurology: The patient has absent sensation measured with a 5.07/10g Semmes Weinstein Monofilament at all pedal sites bilateral . Vibratory sensation diminished bilateral with tuning fork. No Babinski sign present bilateral.   Musculoskeletal: No tenderness to palpation to the ulceration site. There is digital contracture.  Assessment and Plan: Problem List Items Addressed This Visit  Endocrine   Diabetes mellitus (HCC)    Other Visit Diagnoses    PAD (peripheral artery disease) (HCC)    -  Primary   Relevant Orders   POCT ABI Screening for Pilot No Charge   Foot ulcer, right, limited to breakdown of skin (HCC)  (Chronic)        -Examined patient. -Re-Discussed and re-educated patient on diabetic foot care, especially with  regards to the vascular, neurological and musculoskeletal systems.  -Stressed the importance of good glycemic  control and the detriment of not  controlling glucose levels in relation to the foot. -ABI IN OFFICE NORMAL 1.09 ON RIGHT - Excisionally dedbrided ulceration at right great toe to healthy bleeding borders removing nonviable tissue using a sterile chisel blade. Wound measures post debridement as above wound was debrided to the level of the dermis with viable wound base exposed to promote healing. Hemostasis was achieved with manuel pressure. Patient tolerated procedure well without any discomfort or anesthesia necessary for this wound debridement.   -Applied PRISMA and dry sterile dressing and instructed patient to continue with daily dressings at home consisting of the same with offloading padding as previous. -EPIFIX cost discussed with patient and patient states that this option is costly and can not afford to do any advance biologic at this time - Advised patient to go to the ER or return to office if the wound worsens or if constitutional symptoms are present. -Answered all patient questions -Return to office in 2-3 weeks for follow up wound care in [place] will use Endoform at next visit.   Asencion Islamitorya Vondell Babers, DPM

## 2017-09-06 ENCOUNTER — Ambulatory Visit: Payer: BLUE CROSS/BLUE SHIELD | Admitting: Sports Medicine

## 2017-09-06 ENCOUNTER — Encounter: Payer: Self-pay | Admitting: Sports Medicine

## 2017-09-06 VITALS — BP 181/104 | HR 78 | Temp 96.7°F | Resp 16

## 2017-09-06 DIAGNOSIS — L97511 Non-pressure chronic ulcer of other part of right foot limited to breakdown of skin: Secondary | ICD-10-CM

## 2017-09-06 DIAGNOSIS — E1140 Type 2 diabetes mellitus with diabetic neuropathy, unspecified: Secondary | ICD-10-CM

## 2017-09-06 NOTE — Progress Notes (Signed)
Subjective: Prince RomeRobert C Neeb is a 57 y.o. male patient with history of diabetes who returns to office today for follow up evaluation of right great toe ulceration. Patient reports that he bought a new pair of shoes that are a little smaller and cause increased rubbing to the toe which cause more bloody drainage.  Patient denies any swelling redness nausea vomiting fever or chills has been compliant with using Prisma daily to the area.  Patient also states that he has rolled his left ankle even though he has saw the orthopedic doctor reported that it was because of his peroneal tendons he is still complaining of instability and swelling however no pain.  He was given a cam boot to wear on the left however does not wear it because it hurts his back. Reports that he is doing better with his blood sugars. Denies constitutional symptoms. No other issues noted.  Patient Active Problem List   Diagnosis Date Noted  . GERD (gastroesophageal reflux disease) 12/27/2015  . Arthritis 11/16/2015  . Diabetes mellitus (HCC) 11/16/2015  . Hyperlipidemia 11/16/2015  . Localized edema 11/16/2015  . OBESITY 05/15/2007  . ADD 05/15/2007  . HYPERTENSION 05/15/2007  . Allergic rhinitis 05/15/2007  . SLEEP APNEA 05/15/2007  . COUGH 05/15/2007   Current Outpatient Medications on File Prior to Visit  Medication Sig Dispense Refill  . aspirin EC 81 MG tablet Take 81 mg by mouth daily.    . Cetirizine HCl (ZYRTEC ALLERGY PO) Take 10 mg by mouth daily.    Marland Kitchen. glimepiride (AMARYL) 4 MG tablet Take 4 mg by mouth daily with breakfast.     . insulin NPH-regular Human (NOVOLIN 70/30) (70-30) 100 UNIT/ML injection Inject 40 Units into the skin 2 (two) times daily with a meal.    . metFORMIN (GLUCOPHAGE-XR) 500 MG 24 hr tablet Take 1,000 mg by mouth 2 (two) times daily.     . montelukast (SINGULAIR) 10 MG tablet TAKE 1 TABLET BY MOUTH AT BEDTIME 30 tablet 3  . pantoprazole (PROTONIX) 40 MG tablet TAKE 1 TABLET BY MOUTH TWICE  DAILY 60 tablet 3  . ranitidine (ZANTAC) 150 MG tablet Take 1 tablet (150 mg total) by mouth 2 (two) times daily. 60 tablet 2  . Spacer/Aero-Holding Chambers (AEROCHAMBER MV) inhaler Use as instructed 1 each 0   No current facility-administered medications on file prior to visit.    No Known Allergies  No results found for this or any previous visit (from the past 2160 hour(s)).  Objective: General: Patient is awake, alert, and oriented x 3 and in no acute distress.  Integument: Skin is warm, dry and supple bilateral. Nails are short thickened and  dystrophic with subungual debris, consistent with onychomycosis, 1-5 on left and 2-5 on right. Right plantar hallux toe ulceration measures 0.8 x 0.5x0.3 cm, slightly larger than previous, with 100% granular base and reactive macerated keratosis surrounding, + minimal callus distal tuft of right 2nd toe. Remaining integument unremarkable.  Vasculature:  Dorsalis Pedis pulse 1/4 bilateral. Posterior Tibial pulse  1/4 bilateral.  Capillary fill time <3 sec 1-5 bilateral.  Minimal hair growth to the level of the digits.Temperature gradient within normal limits. No varicosities present bilateral.  Neurology: The patient has absent sensation measured with a 5.07/10g Semmes Weinstein Monofilament at all pedal sites bilateral. Vibratory sensation diminished bilateral with tuning fork. No Babinski sign present bilateral.   Musculoskeletal: No tenderness to palpation to the ulceration site. There is digital contracture.  No tenderness to left ankle  however there is significant swelling and subjective instability.  Assessment and Plan: Problem List Items Addressed This Visit      Endocrine   Diabetes mellitus (HCC)    Other Visit Diagnoses    Foot ulcer, right, limited to breakdown of skin (HCC)    -  Primary     -Examined patient. -Re-Discussed and re-educated patient on diabetic foot care, especially with  regards to the vascular, neurological  and musculoskeletal systems.  -Stressed the importance of good glycemic control and the detriment of not  controlling glucose levels in relation to the foot. - Excisionally dedbrided ulceration at right great toe to healthy bleeding borders removing nonviable tissue using a sterile chisel blade. Wound measures post debridement as above wound was debrided to the level of the dermis with viable wound base exposed to promote healing. Hemostasis was achieved with manuel pressure. Patient tolerated procedure well without any discomfort or anesthesia necessary for this wound debridement.   -Applied endoform and dry sterile dressing and instructed patient to continue to keep dressings in place for the next 2 days after 2 days may change outer dressing advised patient if endoform comes off to resume using Prisma -Dispensed compression anklet for the left ankle and advised patient to get an over-the-counter ankle brace to prevent against instability - Advised patient to go to the ER or return to office if the wound worsens or if constitutional symptoms are present. -Answered all patient questions -Return to office in 7 to 10 days follow-up endoform graft at right great toe at next visit.   [name], DPM

## 2017-09-13 ENCOUNTER — Encounter: Payer: Self-pay | Admitting: Sports Medicine

## 2017-09-13 ENCOUNTER — Ambulatory Visit: Payer: BLUE CROSS/BLUE SHIELD | Admitting: Sports Medicine

## 2017-09-13 DIAGNOSIS — L97511 Non-pressure chronic ulcer of other part of right foot limited to breakdown of skin: Secondary | ICD-10-CM | POA: Diagnosis not present

## 2017-09-13 DIAGNOSIS — E1140 Type 2 diabetes mellitus with diabetic neuropathy, unspecified: Secondary | ICD-10-CM | POA: Diagnosis not present

## 2017-09-13 MED ORDER — SULFAMETHOXAZOLE-TRIMETHOPRIM 800-160 MG PO TABS
1.0000 | ORAL_TABLET | Freq: Two times a day (BID) | ORAL | 0 refills | Status: DC
Start: 2017-09-13 — End: 2018-02-28

## 2017-09-13 MED ORDER — SILVER NITRATE-POT NITRATE 75-25 % EX MISC: CUTANEOUS | 0 refills | Status: DC

## 2017-09-13 NOTE — Progress Notes (Signed)
Subjective: [name] is a 57 y.o. male patient with history of diabetes who returns to office today for follow up evaluation of right great toe ulceration. Patient reports that he did some pressure washing on Sunday for 4 hours at his girlfriend's house and he got his foot very wet and since then the toe has swollen and has been red with yellow to red drainage coming out of it.  Patient states that he is very concerned because the wound was doing good but now is worse.  Patient denies nausea, vomiting, fever, chills or any other constitutional symptoms at this time. No other issues noted.  Patient Active Problem List   Diagnosis Date Noted  . GERD (gastroesophageal reflux disease) 12/27/2015  . Arthritis 11/16/2015  . Diabetes mellitus (HCC) 11/16/2015  . Hyperlipidemia 11/16/2015  . Localized edema 11/16/2015  . OBESITY 05/15/2007  . ADD 05/15/2007  . HYPERTENSION 05/15/2007  . Allergic rhinitis 05/15/2007  . SLEEP APNEA 05/15/2007  . COUGH 05/15/2007   Current Outpatient Medications on File Prior to Visit  Medication Sig Dispense Refill  . aspirin EC 81 MG tablet Take 81 mg by mouth daily.    . Cetirizine HCl (ZYRTEC ALLERGY PO) Take 10 mg by mouth daily.    Marland Kitchen glimepiride (AMARYL) 4 MG tablet Take 4 mg by mouth daily with breakfast.     . insulin NPH-regular Human (NOVOLIN 70/30) (70-30) 100 UNIT/ML injection Inject 40 Units into the skin 2 (two) times daily with a meal.    . metFORMIN (GLUCOPHAGE-XR) 500 MG 24 hr tablet Take 1,000 mg by mouth 2 (two) times daily.     . montelukast (SINGULAIR) 10 MG tablet TAKE 1 TABLET BY MOUTH AT BEDTIME 30 tablet 3  . pantoprazole (PROTONIX) 40 MG tablet TAKE 1 TABLET BY MOUTH TWICE DAILY 60 tablet 3  . ranitidine (ZANTAC) 150 MG tablet Take 1 tablet (150 mg total) by mouth 2 (two) times daily. 60 tablet 2  . Spacer/Aero-Holding Chambers (AEROCHAMBER MV) inhaler Use as instructed 1 each 0   No current facility-administered medications on file  prior to visit.    No Known Allergies  No results found for this or any previous visit (from the past 2160 hour(s)).  Objective: General: Patient is awake, alert, and oriented x 3 and in no acute distress.  Integument: Skin is warm, dry and supple bilateral. Nails are short thickened and  dystrophic with subungual debris, consistent with onychomycosis, 1-5 on left and 2-5 on right. Right plantar hallux toe ulceration measures 3.5 x 2.5 cm with 100% granular base and reactive macerated keratosis surrounding and a mix of darker fibrotic tissue with malodor, blanchable erythema to the level of the interphalangeal joint, + minimal callus distal tuft of right 2nd toe. Remaining integument unremarkable.  Vasculature:  Dorsalis Pedis pulse 1/4 bilateral. Posterior Tibial pulse  1/4 bilateral.  Capillary fill time <3 sec 1-5 bilateral.  Minimal hair growth to the level of the digits.Temperature gradient within normal limits. No varicosities present bilateral.  Neurology: The patient has absent sensation measured with a 5.07/10g Semmes Weinstein Monofilament at all pedal sites bilateral. Vibratory sensation diminished bilateral with tuning fork. No Babinski sign present bilateral.   Musculoskeletal: No tenderness to palpation to the ulceration site. There is digital contracture.  No tenderness to left ankle however there is significant swelling and subjective instability and patient reports that he is getting some medial ankle discomfort from  compensation.  Assessment and Plan: Problem List Items Addressed This  Visit      Endocrine   Diabetes mellitus (HCC)   Relevant Orders   WOUND CULTURE    Other Visit Diagnoses    Foot ulcer, right, limited to breakdown of skin (HCC)    -  Primary   Relevant Medications   sulfamethoxazole-trimethoprim (BACTRIM DS,SEPTRA DS) 800-160 MG tablet   silver nitrate applicators 75-25 % applicator   Other Relevant Orders   WOUND CULTURE      -Examined  patient. -Re-Discussed and re-educated patient on diabetic foot care, especially with  regards to the vascular, neurological and musculoskeletal systems.  -Stressed the importance of good glycemic control and the detriment of not  controlling glucose levels in relation to the foot. - Excisionally dedbrided ulceration at right great toe to healthy bleeding borders removing nonviable tissue using a sterile chisel blade. Wound measures post debridement as above wound was debrided to the level of the dermis with viable wound base exposed to promote healing. Hemostasis was achieved with manuel pressure. Patient tolerated procedure well without any discomfort or anesthesia necessary for this wound debridement.  Wound culture obtained and started patient empirically on Bactrim we will call patient if there needs to be a change with his antibiotic therapy. -Applied silver nitrate and Betadine wet-to-dry dressing and instructed patient on how to change the dressing alternating with the silver nitrate sticks every other day and the Betadine wet-to-dry daily -Dispense postop shoe -Advised patient to get an over-the-counter ankle brace to prevent against instability as previous - Advised patient to go to the ER or return to office if the wound worsens or if constitutional symptoms are present. -Answered all patient questions -Return to office in 1 week for follow-up wound care.   Asencion Islamitorya Derotha Fishbaugh, DPM

## 2017-09-15 LAB — WOUND CULTURE

## 2017-09-16 ENCOUNTER — Telehealth: Payer: Self-pay | Admitting: *Deleted

## 2017-09-16 NOTE — Telephone Encounter (Signed)
-----   Message from Asencion Islamitorya [name], [place] sent at 09/16/2017 11:56 AM EDT ----- Let patient know that culture showed normal skin bacteria.He should continue with his Bactrim until completed. No other additional antibiotics needed at this time. -Dr. Marylene LandStover

## 2017-09-16 NOTE — Telephone Encounter (Signed)
I informed pt of Dr. [name]'s review of results and orders. Pt states understanding. 

## 2017-09-20 ENCOUNTER — Ambulatory Visit: Payer: BLUE CROSS/BLUE SHIELD | Admitting: Sports Medicine

## 2017-09-20 ENCOUNTER — Encounter: Payer: Self-pay | Admitting: Sports Medicine

## 2017-09-20 VITALS — BP 181/99 | HR 87 | Resp 15

## 2017-09-20 DIAGNOSIS — L97511 Non-pressure chronic ulcer of other part of right foot limited to breakdown of skin: Secondary | ICD-10-CM | POA: Diagnosis not present

## 2017-09-20 DIAGNOSIS — E119 Type 2 diabetes mellitus without complications: Secondary | ICD-10-CM | POA: Diagnosis not present

## 2017-09-20 DIAGNOSIS — I739 Peripheral vascular disease, unspecified: Secondary | ICD-10-CM

## 2017-09-20 DIAGNOSIS — E1140 Type 2 diabetes mellitus with diabetic neuropathy, unspecified: Secondary | ICD-10-CM

## 2017-09-20 NOTE — Progress Notes (Signed)
Subjective: Prince RomeRobert C Winders is a 57 y.o. male patient with history of diabetes who returns to office today for follow up evaluation of right great toe ulceration. Patient reports that on Wednesday he had chills but denies any other symptoms. Reports . Patient is on Bactrim. No other issues noted.  Patient Active Problem List   Diagnosis Date Noted  . GERD (gastroesophageal reflux disease) 12/27/2015  . Arthritis 11/16/2015  . Diabetes mellitus (HCC) 11/16/2015  . Hyperlipidemia 11/16/2015  . Localized edema 11/16/2015  . OBESITY 05/15/2007  . ADD 05/15/2007  . HYPERTENSION 05/15/2007  . Allergic rhinitis 05/15/2007  . SLEEP APNEA 05/15/2007  . COUGH 05/15/2007   Current Outpatient Medications on File Prior to Visit  Medication Sig Dispense Refill  . aspirin EC 81 MG tablet Take 81 mg by mouth daily.    . Cetirizine HCl (ZYRTEC ALLERGY PO) Take 10 mg by mouth daily.    Marland Kitchen. glimepiride (AMARYL) 4 MG tablet Take 4 mg by mouth daily with breakfast.     . insulin NPH-regular Human (NOVOLIN 70/30) (70-30) 100 UNIT/ML injection Inject 40 Units into the skin 2 (two) times daily with a meal.    . metFORMIN (GLUCOPHAGE-XR) 500 MG 24 hr tablet Take 1,000 mg by mouth 2 (two) times daily.     . montelukast (SINGULAIR) 10 MG tablet TAKE 1 TABLET BY MOUTH AT BEDTIME 30 tablet 3  . pantoprazole (PROTONIX) 40 MG tablet TAKE 1 TABLET BY MOUTH TWICE DAILY 60 tablet 3  . ranitidine (ZANTAC) 150 MG tablet Take 1 tablet (150 mg total) by mouth 2 (two) times daily. 60 tablet 2  . silver nitrate applicators 75-25 % applicator Apply topically 3 (three) times a week. 100 each 0  . Spacer/Aero-Holding Chambers (AEROCHAMBER MV) inhaler Use as instructed 1 each 0  . sulfamethoxazole-trimethoprim (BACTRIM DS,SEPTRA DS) 800-160 MG tablet Take 1 tablet by mouth 2 (two) times daily. 28 tablet 0   No current facility-administered medications on file prior to visit.    No Known Allergies  Recent Results (from the past  2160 hour(s))  WOUND CULTURE     Status: None   Collection Time: 09/13/17 12:37 PM  Result Value Ref Range   Gram Stain Result Final report    Organism ID, Bacteria Comment     Comment: Rare white blood cells.   Organism ID, Bacteria Comment     Comment: Moderate gram negative rods.   Organism ID, Bacteria Comment     Comment: Moderate number of gram negative diplococci.   Organism ID, Bacteria Comment     Comment: Few gram positive cocci   Aerobic Bacterial Culture Final report    Organism ID, Bacteria Routine flora     Comment: Heavy growth    Objective: General: Patient is awake, alert, and oriented x 3 and in no acute distress.  Integument: Skin is warm, dry and supple bilateral. Nails are short thickened and  dystrophic with subungual debris, consistent with onychomycosis, 1-5 on left and 2-5 on right. Right plantar hallux toe ulceration measures 1.5 x 1.5 cm with 100% granular base and decreased maceration and mild reactive keratosis, no malodor, blanchable erythema to the level of the interphalangeal joint, + minimal callus distal tuft of right 2nd toe. Remaining integument unremarkable.  Vasculature:  Dorsalis Pedis pulse 1/4 bilateral. Posterior Tibial pulse  1/4 bilateral.  Capillary fill time <3 sec 1-5 bilateral.  Minimal hair growth to the level of the digits.Temperature gradient within normal limits. No varicosities present  bilateral.  Neurology: The patient has absent sensation measured with a 5.07/10g Semmes Weinstein Monofilament at all pedal sites bilateral. Vibratory sensation diminished bilateral with tuning fork. No Babinski sign present bilateral.   Musculoskeletal: No tenderness to palpation to the ulceration site. There is digital contracture.  No tenderness to left ankle however there is significant swelling and subjective instability and patient reports that he is getting some medial ankle discomfort from  compensation.  Assessment and Plan: Problem List  Items Addressed This Visit      Endocrine   Diabetes mellitus (HCC)    Other Visit Diagnoses    Foot ulcer, right, limited to breakdown of skin (HCC)    -  Primary   PAD (peripheral artery disease) (HCC)       Encounter for comprehensive diabetic foot examination, type 2 diabetes mellitus (HCC)          -Examined patient. -Re-Discussed and re-educated patient on diabetic foot care, especially with  regards to the vascular, neurological and musculoskeletal systems.  -Stressed the importance of good glycemic control and the detriment of not  controlling glucose levels in relation to the foot. - Excisionally dedbrided ulceration at right great toe to healthy bleeding borders removing nonviable tissue using a sterile chisel blade. Wound measures post debridement as above wound was debrided to the level of the dermis with viable wound base exposed to promote healing. Hemostasis was achieved with manuel pressure. Patient tolerated procedure well without any discomfort or anesthesia necessary for this wound debridement.   -Applied silver nitrate and Iodosorb dressing and instructed patient on how to change the dressing alternating with the silver nitrate sticks every other day and the Iodosorb daily -Return to post op shoe if worsens -Continue with brace for left - Advised patient to go to the ER or return to office if the wound worsens or if constitutional symptoms are present. -Answered all patient questions -Return to office in 10 days for follow-up wound care.   [name], DPM

## 2017-10-02 ENCOUNTER — Ambulatory Visit: Payer: BLUE CROSS/BLUE SHIELD | Admitting: Sports Medicine

## 2017-10-02 ENCOUNTER — Encounter: Payer: Self-pay | Admitting: Sports Medicine

## 2017-10-02 DIAGNOSIS — I739 Peripheral vascular disease, unspecified: Secondary | ICD-10-CM

## 2017-10-02 DIAGNOSIS — E1140 Type 2 diabetes mellitus with diabetic neuropathy, unspecified: Secondary | ICD-10-CM | POA: Diagnosis not present

## 2017-10-02 DIAGNOSIS — L97511 Non-pressure chronic ulcer of other part of right foot limited to breakdown of skin: Secondary | ICD-10-CM | POA: Diagnosis not present

## 2017-10-02 NOTE — Progress Notes (Signed)
Subjective: [name] is a 57 y.o. male patient with history of diabetes who returns to office today for follow up evaluation of right great toe ulceration. Patient reports no pain to the ulcerated area however does notice that the white skin seems to getting larger around the wound.  Denies nausea, vomiting, fever, chills or any other constitutional symptoms at this time.  Patient finished Bactrim. No other issues noted.  Patient Active Problem List   Diagnosis Date Noted  . GERD (gastroesophageal reflux disease) 12/27/2015  . Arthritis 11/16/2015  . Diabetes mellitus (HCC) 11/16/2015  . Hyperlipidemia 11/16/2015  . Localized edema 11/16/2015  . OBESITY 05/15/2007  . ADD 05/15/2007  . HYPERTENSION 05/15/2007  . Allergic rhinitis 05/15/2007  . SLEEP APNEA 05/15/2007  . COUGH 05/15/2007   Current Outpatient Medications on File Prior to Visit  Medication Sig Dispense Refill  . aspirin EC 81 MG tablet Take 81 mg by mouth daily.    . Cetirizine HCl (ZYRTEC ALLERGY PO) Take 10 mg by mouth daily.    Marland Kitchen. glimepiride (AMARYL) 4 MG tablet Take 4 mg by mouth daily with breakfast.     . insulin NPH-regular Human (NOVOLIN 70/30) (70-30) 100 UNIT/ML injection Inject 40 Units into the skin 2 (two) times daily with a meal.    . metFORMIN (GLUCOPHAGE-XR) 500 MG 24 hr tablet Take 1,000 mg by mouth 2 (two) times daily.     . montelukast (SINGULAIR) 10 MG tablet TAKE 1 TABLET BY MOUTH AT BEDTIME 30 tablet 3  . pantoprazole (PROTONIX) 40 MG tablet TAKE 1 TABLET BY MOUTH TWICE DAILY 60 tablet 3  . ranitidine (ZANTAC) 150 MG tablet Take 1 tablet (150 mg total) by mouth 2 (two) times daily. 60 tablet 2  . silver nitrate applicators 75-25 % applicator Apply topically 3 (three) times a week. 100 each 0  . Spacer/Aero-Holding Chambers (AEROCHAMBER MV) inhaler Use as instructed 1 each 0  . sulfamethoxazole-trimethoprim (BACTRIM DS,SEPTRA DS) 800-160 MG tablet Take 1 tablet by mouth 2 (two) times daily. 28  tablet 0   No current facility-administered medications on file prior to visit.    No Known Allergies  Recent Results (from the past 2160 hour(s))  WOUND CULTURE     Status: None   Collection Time: 09/13/17 12:37 PM  Result Value Ref Range   Gram Stain Result Final report    Organism ID, Bacteria Comment     Comment: Rare white blood cells.   Organism ID, Bacteria Comment     Comment: Moderate gram negative rods.   Organism ID, Bacteria Comment     Comment: Moderate number of gram negative diplococci.   Organism ID, Bacteria Comment     Comment: Few gram positive cocci   Aerobic Bacterial Culture Final report    Organism ID, Bacteria Routine flora     Comment: Heavy growth    Objective: General: Patient is awake, alert, and oriented x 3 and in no acute distress.  Integument: Skin is warm, dry and supple bilateral. Nails are short thickened and  dystrophic with subungual debris, consistent with onychomycosis, 1-5 on left and 2-5 on right. Right plantar hallux toe ulceration measures 1.5 x 1.3 cm with 100% granular base and decreased maceration and mild reactive keratosis, no malodor, blanchable erythema to the level of the interphalangeal joint, + minimal callus distal tuft of right 2nd toe. Remaining integument unremarkable.  Vasculature:  Dorsalis Pedis pulse 1/4 bilateral. Posterior Tibial pulse  1/4 bilateral.  Capillary fill time <  3 sec 1-5 bilateral.  Minimal hair growth to the level of the digits.Temperature gradient within normal limits. No varicosities present bilateral.  Neurology: The patient has absent sensation measured with a 5.07/10g Semmes Weinstein Monofilament at all pedal sites bilateral. Vibratory sensation diminished bilateral with tuning fork. No Babinski sign present bilateral.   Musculoskeletal: No tenderness to palpation to the ulceration site. There is digital contracture.  No tenderness to left ankle however there is significant swelling and subjective  instability and patient reports that he is getting some medial ankle discomfort from  compensation that seems to have slightly improved with new shoes.  Assessment and Plan: Problem List Items Addressed This Visit      Endocrine   Diabetes mellitus (HCC)    Other Visit Diagnoses    Foot ulcer, right, limited to breakdown of skin (HCC)    -  Primary   PAD (peripheral artery disease) (HCC)         -Examined patient. -Re-Discussed and re-educated patient on diabetic foot care, especially with  regards to the vascular, neurological and musculoskeletal systems.  -Stressed the importance of good glycemic control and the detriment of not  controlling glucose levels in relation to the foot. - Excisionally dedbrided ulceration at right great toe to healthy bleeding borders removing nonviable tissue using a sterile chisel blade. Wound measures post debridement as above wound was debrided to the level of the dermis with viable wound base exposed to promote healing. Hemostasis was achieved with manuel pressure. Patient tolerated procedure well without any discomfort or anesthesia necessary for this wound debridement.   -Applied silver nitrate and Iodosorb dressing and instructed patient on how to change the dressing alternating with the silver nitrate sticks every other day and the Iodosorb daily -Bactrim completed -Return to post op shoe if worsens -Continue with brace for left and good supportive shoes - Advised patient to go to the ER or return to office if the wound worsens or if constitutional symptoms are present. -Answered all patient questions -Return to office in 14 days for follow-up wound care.  Discussed with patient if he has trouble paying his co-pay may defer copayment to be billed later by insurance.  Asencion Islamitorya Leam Madero, DPM

## 2017-10-10 DIAGNOSIS — E559 Vitamin D deficiency, unspecified: Secondary | ICD-10-CM | POA: Diagnosis not present

## 2017-10-10 DIAGNOSIS — S93402A Sprain of unspecified ligament of left ankle, initial encounter: Secondary | ICD-10-CM | POA: Diagnosis not present

## 2017-10-10 DIAGNOSIS — R202 Paresthesia of skin: Secondary | ICD-10-CM | POA: Diagnosis not present

## 2017-10-10 DIAGNOSIS — R609 Edema, unspecified: Secondary | ICD-10-CM | POA: Diagnosis not present

## 2017-10-10 DIAGNOSIS — E7800 Pure hypercholesterolemia, unspecified: Secondary | ICD-10-CM | POA: Diagnosis not present

## 2017-10-10 DIAGNOSIS — I10 Essential (primary) hypertension: Secondary | ICD-10-CM | POA: Diagnosis not present

## 2017-10-10 DIAGNOSIS — E119 Type 2 diabetes mellitus without complications: Secondary | ICD-10-CM | POA: Diagnosis not present

## 2017-10-16 ENCOUNTER — Encounter: Payer: Self-pay | Admitting: Sports Medicine

## 2017-10-16 ENCOUNTER — Ambulatory Visit: Payer: BLUE CROSS/BLUE SHIELD | Admitting: Sports Medicine

## 2017-10-16 VITALS — BP 189/99 | HR 96 | Resp 15

## 2017-10-16 DIAGNOSIS — I739 Peripheral vascular disease, unspecified: Secondary | ICD-10-CM | POA: Diagnosis not present

## 2017-10-16 DIAGNOSIS — E1140 Type 2 diabetes mellitus with diabetic neuropathy, unspecified: Secondary | ICD-10-CM

## 2017-10-16 DIAGNOSIS — L97511 Non-pressure chronic ulcer of other part of right foot limited to breakdown of skin: Secondary | ICD-10-CM

## 2017-10-16 NOTE — Progress Notes (Signed)
Subjective: [name] is a 57 y.o. male patient with history of diabetes who returns to office today for follow up evaluation of right great toe ulceration. Patient reports no pain to the ulcerated area however does state that he went to the feet over the weekend and did not care for the wound and he walked on the sand.  Patient denies nausea, vomiting, fever, chills or any other constitutional symptoms at this time. No other issues noted.   Patient Active Problem List   Diagnosis Date Noted  . GERD (gastroesophageal reflux disease) 12/27/2015  . Arthritis 11/16/2015  . Diabetes mellitus (HCC) 11/16/2015  . Hyperlipidemia 11/16/2015  . Localized edema 11/16/2015  . OBESITY 05/15/2007  . ADD 05/15/2007  . HYPERTENSION 05/15/2007  . Allergic rhinitis 05/15/2007  . SLEEP APNEA 05/15/2007  . COUGH 05/15/2007   Current Outpatient Medications on File Prior to Visit  Medication Sig Dispense Refill  . aspirin EC 81 MG tablet Take 81 mg by mouth daily.    . Cetirizine HCl (ZYRTEC ALLERGY PO) Take 10 mg by mouth daily.    Marland Kitchen glimepiride (AMARYL) 4 MG tablet Take 4 mg by mouth daily with breakfast.     . insulin NPH-regular Human (NOVOLIN 70/30) (70-30) 100 UNIT/ML injection Inject 40 Units into the skin 2 (two) times daily with a meal.    . metFORMIN (GLUCOPHAGE-XR) 500 MG 24 hr tablet Take 1,000 mg by mouth 2 (two) times daily.     . montelukast (SINGULAIR) 10 MG tablet TAKE 1 TABLET BY MOUTH AT BEDTIME 30 tablet 3  . pantoprazole (PROTONIX) 40 MG tablet TAKE 1 TABLET BY MOUTH TWICE DAILY 60 tablet 3  . ranitidine (ZANTAC) 150 MG tablet Take 1 tablet (150 mg total) by mouth 2 (two) times daily. 60 tablet 2  . silver nitrate applicators 75-25 % applicator Apply topically 3 (three) times a week. 100 each 0  . Spacer/Aero-Holding Chambers (AEROCHAMBER MV) inhaler Use as instructed 1 each 0  . sulfamethoxazole-trimethoprim (BACTRIM DS,SEPTRA DS) 800-160 MG tablet Take 1 tablet by mouth 2 (two)  times daily. 28 tablet 0   No current facility-administered medications on file prior to visit.    No Known Allergies  Recent Results (from the past 2160 hour(s))  WOUND CULTURE     Status: None   Collection Time: 09/13/17 12:37 PM  Result Value Ref Range   Gram Stain Result Final report    Organism ID, Bacteria Comment     Comment: Rare white blood cells.   Organism ID, Bacteria Comment     Comment: Moderate gram negative rods.   Organism ID, Bacteria Comment     Comment: Moderate number of gram negative diplococci.   Organism ID, Bacteria Comment     Comment: Few gram positive cocci   Aerobic Bacterial Culture Final report    Organism ID, Bacteria Routine flora     Comment: Heavy growth    Objective: General: Patient is awake, alert, and oriented x 3 and in no acute distress.  Integument: Skin is warm, dry and supple bilateral. Nails are short thickened and  dystrophic with subungual debris, consistent with onychomycosis, 1-5 on left and 2-5 on right. Right plantar hallux toe ulceration measures 1.3 x 1.1 cm with 100% granular base and decreased maceration and mild reactive keratosis, no malodor, blanchable erythema to the level of the interphalangeal joint, + minimal callus distal tuft of right 2nd toe. Remaining integument unremarkable.  Vasculature:  Dorsalis Pedis pulse 1/4 bilateral. Posterior  Tibial pulse  1/4 bilateral.  Capillary fill time <3 sec 1-5 bilateral.  Minimal hair growth to the level of the digits.Temperature gradient within normal limits. No varicosities present bilateral.  Neurology: The patient has absent sensation measured with a 5.07/10g Semmes Weinstein Monofilament at all pedal sites bilateral. Vibratory sensation diminished bilateral with tuning fork. No Babinski sign present bilateral.   Musculoskeletal: No tenderness to palpation to the ulceration site. There is digital contracture.  Recurrent tenderness to left ankle however there is significant  swelling and subjective instability and patient reports that he walked on the beach and has been going without his brace.  Assessment and Plan: Problem List Items Addressed This Visit      Endocrine   Diabetes mellitus (HCC)    Other Visit Diagnoses    Foot ulcer, right, limited to breakdown of skin (HCC)    -  Primary   PAD (peripheral artery disease) (HCC)         -Examined patient. -Re-Discussed and re-educated patient on diabetic foot care, especially with  regards to the vascular, neurological and musculoskeletal systems.  -Stressed the importance of good glycemic control and the detriment of not  controlling glucose levels in relation to the foot. - Excisionally dedbrided ulceration at right great toe to healthy bleeding borders removing nonviable tissue using a sterile chisel blade. Wound measures post debridement as above wound was debrided to the level of the dermis with viable wound base exposed to promote healing. Hemostasis was achieved with manuel pressure. Patient tolerated procedure well without any discomfort or anesthesia necessary for this wound debridement.   -Applied silver nitrate and Iodosorb dressing and instructed patient on how to change the dressing alternating with the silver nitrate sticks every other day and the Iodosorb daily as previous -Continue with brace for left and good supportive shoes as previous and advised patient if continues to worsen may need further work-up - Advised patient to go to the ER or return to office if the wound worsens or if constitutional symptoms are present. -Answered all patient questions -Return to office in 2 weeks for follow-up wound care.  [name], DPM

## 2017-10-23 DIAGNOSIS — Z23 Encounter for immunization: Secondary | ICD-10-CM | POA: Diagnosis not present

## 2017-10-24 DIAGNOSIS — R3129 Other microscopic hematuria: Secondary | ICD-10-CM | POA: Diagnosis not present

## 2017-11-01 ENCOUNTER — Ambulatory Visit: Payer: BLUE CROSS/BLUE SHIELD | Admitting: Sports Medicine

## 2017-11-01 ENCOUNTER — Encounter: Payer: Self-pay | Admitting: Sports Medicine

## 2017-11-01 VITALS — BP 165/104 | HR 89 | Temp 96.7°F | Resp 15

## 2017-11-01 DIAGNOSIS — L97511 Non-pressure chronic ulcer of other part of right foot limited to breakdown of skin: Secondary | ICD-10-CM | POA: Diagnosis not present

## 2017-11-01 DIAGNOSIS — E1140 Type 2 diabetes mellitus with diabetic neuropathy, unspecified: Secondary | ICD-10-CM | POA: Diagnosis not present

## 2017-11-01 DIAGNOSIS — I739 Peripheral vascular disease, unspecified: Secondary | ICD-10-CM | POA: Diagnosis not present

## 2017-11-01 NOTE — Progress Notes (Signed)
Subjective: [name] is a 57 y.o. male patient with history of diabetes who returns to office today for follow up evaluation of right great toe ulceration. Patient reports no pain to the ulcerated area.  Reports that he thinks that the wound is improving.states that he has been compliant with using his silver nitrate sticks and Iodosorb.  Patient denies nausea, vomiting, fever, chills or any other constitutional symptoms at this time. No other issues noted.   Fasting blood sugar this morning was 200 last A1c 7.2 and he last saw his primary care doctor 2 weeks ago.  Patient Active Problem List   Diagnosis Date Noted  . GERD (gastroesophageal reflux disease) 12/27/2015  . Arthritis 11/16/2015  . Diabetes mellitus (HCC) 11/16/2015  . Hyperlipidemia 11/16/2015  . Localized edema 11/16/2015  . OBESITY 05/15/2007  . ADD 05/15/2007  . HYPERTENSION 05/15/2007  . Allergic rhinitis 05/15/2007  . SLEEP APNEA 05/15/2007  . COUGH 05/15/2007   Current Outpatient Medications on File Prior to Visit  Medication Sig Dispense Refill  . aspirin EC 81 MG tablet Take 81 mg by mouth daily.    . Cetirizine HCl (ZYRTEC ALLERGY PO) Take 10 mg by mouth daily.    Marland Kitchen glimepiride (AMARYL) 4 MG tablet Take 4 mg by mouth daily with breakfast.     . insulin NPH-regular Human (NOVOLIN 70/30) (70-30) 100 UNIT/ML injection Inject 40 Units into the skin 2 (two) times daily with a meal.    . metFORMIN (GLUCOPHAGE-XR) 500 MG 24 hr tablet Take 1,000 mg by mouth 2 (two) times daily.     . montelukast (SINGULAIR) 10 MG tablet TAKE 1 TABLET BY MOUTH AT BEDTIME 30 tablet 3  . pantoprazole (PROTONIX) 40 MG tablet TAKE 1 TABLET BY MOUTH TWICE DAILY 60 tablet 3  . ranitidine (ZANTAC) 150 MG tablet Take 1 tablet (150 mg total) by mouth 2 (two) times daily. 60 tablet 2  . silver nitrate applicators 75-25 % applicator Apply topically 3 (three) times a week. 100 each 0  . Spacer/Aero-Holding Chambers (AEROCHAMBER MV) inhaler Use  as instructed 1 each 0  . sulfamethoxazole-trimethoprim (BACTRIM DS,SEPTRA DS) 800-160 MG tablet Take 1 tablet by mouth 2 (two) times daily. 28 tablet 0   No current facility-administered medications on file prior to visit.    No Known Allergies  Recent Results (from the past 2160 hour(s))  WOUND CULTURE     Status: None   Collection Time: 09/13/17 12:37 PM  Result Value Ref Range   Gram Stain Result Final report    Organism ID, Bacteria Comment     Comment: Rare white blood cells.   Organism ID, Bacteria Comment     Comment: Moderate gram negative rods.   Organism ID, Bacteria Comment     Comment: Moderate number of gram negative diplococci.   Organism ID, Bacteria Comment     Comment: Few gram positive cocci   Aerobic Bacterial Culture Final report    Organism ID, Bacteria Routine flora     Comment: Heavy growth    Objective: General: Patient is awake, alert, and oriented x 3 and in no acute distress.  Integument: Skin is warm, dry and supple bilateral. Nails are short thickened and  dystrophic with subungual debris, consistent with onychomycosis, 1-5 on left and 2-5 on right. Right plantar hallux toe ulceration measures 1.1 x 0.8 cm with 100% granular base and decreased maceration and mild reactive keratosis, no malodor, blanchable erythema to the level of the interphalangeal joint, +  minimal callus distal tuft of right 2nd toe. Remaining integument unremarkable.  Vasculature:  Dorsalis Pedis pulse 1/4 bilateral. Posterior Tibial pulse  1/4 bilateral.  Capillary fill time <3 sec 1-5 bilateral.  Minimal hair growth to the level of the digits.Temperature gradient within normal limits. No varicosities present bilateral.  Neurology: The patient has absent sensation measured with a 5.07/10g Semmes Weinstein Monofilament at all pedal sites bilateral. Vibratory sensation diminished bilateral with tuning fork. No Babinski sign present bilateral.   Musculoskeletal: No tenderness to  palpation to the ulceration site. There is digital contracture.  Recurrent tenderness to left ankle however there is significant swelling and subjective instability like previous.  Assessment and Plan: Problem List Items Addressed This Visit      Endocrine   Diabetes mellitus (HCC)    Other Visit Diagnoses    Foot ulcer, right, limited to breakdown of skin (HCC)    -  Primary   PAD (peripheral artery disease) (HCC)         -Examined patient. -Re-Discussed and re-educated patient on diabetic foot care, especially with  regards to the vascular, neurological and musculoskeletal systems.  -Stressed the importance of good glycemic control and the detriment of not  controlling glucose levels in relation to the foot. - Excisionally dedbrided ulceration at right great toe to healthy bleeding borders removing nonviable tissue using a sterile chisel blade. Wound measures post debridement as above wound was debrided to the level of the dermis with viable wound base exposed to promote healing. Hemostasis was achieved with manuel pressure. Patient tolerated procedure well without any discomfort or anesthesia necessary for this wound debridement.   -Applied Prisma and tube foam dressing and instructed patient on how to change for this week using the Prisma and to foam and then next week to return to the dressing of alternating with the silver nitrate sticks every other day and the Iodosorb daily as previous since he is planning to go to the beach and I expect possible more maceration -Continue with brace for left and good supportive shoes as previous and advised patient if continues to worsen may need further work-up like previous and to avoid walking on the beach without brace - Advised patient to go to the ER or return to office if the wound worsens or if constitutional symptoms are present. -Answered all patient questions -Return to office in 2-3 weeks for follow-up wound care.  Asencion Islamitorya Dai Apel, DPM

## 2017-11-22 ENCOUNTER — Encounter: Payer: Self-pay | Admitting: Sports Medicine

## 2017-11-22 ENCOUNTER — Ambulatory Visit: Payer: BLUE CROSS/BLUE SHIELD | Admitting: Sports Medicine

## 2017-11-22 VITALS — BP 162/99 | HR 91 | Temp 97.8°F | Resp 16

## 2017-11-22 DIAGNOSIS — I739 Peripheral vascular disease, unspecified: Secondary | ICD-10-CM

## 2017-11-22 DIAGNOSIS — L97511 Non-pressure chronic ulcer of other part of right foot limited to breakdown of skin: Secondary | ICD-10-CM | POA: Diagnosis not present

## 2017-11-22 DIAGNOSIS — E1140 Type 2 diabetes mellitus with diabetic neuropathy, unspecified: Secondary | ICD-10-CM

## 2017-11-22 NOTE — Progress Notes (Signed)
Subjective: [name] is a 57 y.o. male patient with history of diabetes who returns to office today for follow up evaluation of right great toe ulceration. Patient reports no pain to the ulcerated area.  Reports that he thinks that the wound is improving.Patient states that he has been compliant with using his prism and did go to the beach but however did not walk on the beach.  Patient denies nausea, vomiting, fever, chills or any other constitutional symptoms at this time.  Patient admits to continued left ankle swelling of which she saw orthopedics for that seems like it is not getting better.  No other issues noted.   Fasting blood sugar this morning was 134 Ast A1c 7.2 and he last saw his primary care doctor 4 weeks ago.  Patient Active Problem List   Diagnosis Date Noted  . GERD (gastroesophageal reflux disease) 12/27/2015  . Arthritis 11/16/2015  . Diabetes mellitus (HCC) 11/16/2015  . Hyperlipidemia 11/16/2015  . Localized edema 11/16/2015  . OBESITY 05/15/2007  . ADD 05/15/2007  . HYPERTENSION 05/15/2007  . Allergic rhinitis 05/15/2007  . SLEEP APNEA 05/15/2007  . COUGH 05/15/2007   Current Outpatient Medications on File Prior to Visit  Medication Sig Dispense Refill  . aspirin EC 81 MG tablet Take 81 mg by mouth daily.    . Cetirizine HCl (ZYRTEC ALLERGY PO) Take 10 mg by mouth daily.    Marland Kitchen glimepiride (AMARYL) 4 MG tablet Take 4 mg by mouth daily with breakfast.     . insulin NPH-regular Human (NOVOLIN 70/30) (70-30) 100 UNIT/ML injection Inject 40 Units into the skin 2 (two) times daily with a meal.    . metFORMIN (GLUCOPHAGE-XR) 500 MG 24 hr tablet Take 1,000 mg by mouth 2 (two) times daily.     . montelukast (SINGULAIR) 10 MG tablet TAKE 1 TABLET BY MOUTH AT BEDTIME 30 tablet 3  . pantoprazole (PROTONIX) 40 MG tablet TAKE 1 TABLET BY MOUTH TWICE DAILY 60 tablet 3  . ranitidine (ZANTAC) 150 MG tablet Take 1 tablet (150 mg total) by mouth 2 (two) times daily. 60 tablet 2   . silver nitrate applicators 75-25 % applicator Apply topically 3 (three) times a week. 100 each 0  . simvastatin (ZOCOR) 40 MG tablet TAKE 1 TABLET BY MOUTH ONCE DAILY IN THE EVENING FOR 30 DAYS  11  . Spacer/Aero-Holding Chambers (AEROCHAMBER MV) inhaler Use as instructed 1 each 0  . sulfamethoxazole-trimethoprim (BACTRIM DS,SEPTRA DS) 800-160 MG tablet Take 1 tablet by mouth 2 (two) times daily. 28 tablet 0   No current facility-administered medications on file prior to visit.    No Known Allergies  Recent Results (from the past 2160 hour(s))  WOUND CULTURE     Status: None   Collection Time: 09/13/17 12:37 PM  Result Value Ref Range   Gram Stain Result Final report    Organism ID, Bacteria Comment     Comment: Rare white blood cells.   Organism ID, Bacteria Comment     Comment: Moderate gram negative rods.   Organism ID, Bacteria Comment     Comment: Moderate number of gram negative diplococci.   Organism ID, Bacteria Comment     Comment: Few gram positive cocci   Aerobic Bacterial Culture Final report    Organism ID, Bacteria Routine flora     Comment: Heavy growth    Objective: General: Patient is awake, alert, and oriented x 3 and in no acute distress.  Integument: Skin is warm, dry  and supple bilateral. Nails are short thickened and  dystrophic with subungual debris, consistent with onychomycosis, 1-5 on left and 2-5 on right. Right plantar hallux toe ulceration measures 0.8 x 0.6 cm with 100% granular base and decreased maceration and mild reactive keratosis, no malodor, blanchable erythema to the level of the interphalangeal joint, + minimal callus distal tuft of right 2nd toe. Remaining integument unremarkable.  Vasculature:  Dorsalis Pedis pulse 1/4 bilateral. Posterior Tibial pulse  1/4 bilateral.  Capillary fill time <3 sec 1-5 bilateral.  Minimal hair growth to the level of the digits.Temperature gradient within normal limits. No varicosities present  bilateral.  Neurology: The patient has absent sensation measured with a 5.07/10g Semmes Weinstein Monofilament at all pedal sites bilateral. Vibratory sensation diminished bilateral with tuning fork. No Babinski sign present bilateral.   Musculoskeletal: No tenderness to palpation to the ulceration site. There is digital contracture.  Recurrent tenderness to left ankle however there is significant swelling and subjective instability like previous.  Assessment and Plan: Problem List Items Addressed This Visit      Endocrine   Diabetes mellitus (HCC)   Relevant Medications   simvastatin (ZOCOR) 40 MG tablet    Other Visit Diagnoses    Foot ulcer, right, limited to breakdown of skin (HCC)    -  Primary   PAD (peripheral artery disease) (HCC)       Relevant Medications   simvastatin (ZOCOR) 40 MG tablet     -Examined patient. -Re-Discussed and re-educated patient on diabetic foot care, especially with  regards to the vascular, neurological and musculoskeletal systems.  -Stressed the importance of good glycemic control and the detriment of not  controlling glucose levels in relation to the foot. - Excisionally dedbrided ulceration at right great toe to healthy bleeding borders removing nonviable tissue using a sterile chisel blade and saline moistened gauze. Wound measures post debridement as above wound was debrided to the level of the dermis with viable wound base exposed to promote healing. Hemostasis was achieved with manuel pressure. Patient tolerated procedure well without any discomfort or anesthesia necessary for this wound debridement.   -Applied Prisma and tube foam dressing and instructed patient to continue with dressing changes consisting of the same -Advised patient to return to using cam boot for at least 2 weeks and then after if the ankle is feeling better and less swelling to transition back to shoe with brace if does not improve recommend a further work-up for the left  ankle - Advised patient to go to the ER or return to office if the wound worsens or if constitutional symptoms are present. -Answered all patient questions -Return to office in 2-3 weeks for follow-up wound care.  [name], DPM

## 2017-12-13 ENCOUNTER — Ambulatory Visit: Payer: BLUE CROSS/BLUE SHIELD | Admitting: Sports Medicine

## 2017-12-13 ENCOUNTER — Encounter: Payer: Self-pay | Admitting: Sports Medicine

## 2017-12-13 VITALS — BP 173/98 | HR 83 | Temp 97.6°F | Resp 16

## 2017-12-13 DIAGNOSIS — I739 Peripheral vascular disease, unspecified: Secondary | ICD-10-CM

## 2017-12-13 DIAGNOSIS — L97511 Non-pressure chronic ulcer of other part of right foot limited to breakdown of skin: Secondary | ICD-10-CM | POA: Diagnosis not present

## 2017-12-13 DIAGNOSIS — E1140 Type 2 diabetes mellitus with diabetic neuropathy, unspecified: Secondary | ICD-10-CM | POA: Diagnosis not present

## 2017-12-13 DIAGNOSIS — B351 Tinea unguium: Secondary | ICD-10-CM

## 2017-12-13 NOTE — Progress Notes (Signed)
Subjective: [name] is a 57 y.o. male patient with history of diabetes who returns to office today for follow up evaluation of right great toe ulceration. Patient reports no pain to the ulcerated area however there is still a little bit of drainage and reports that he is dressing the wound every day using the collagen sponge as I provided last visit. Patient denies nausea, vomiting, fever, chills or any other constitutional symptoms at this time.  Patient admits to continued left ankle swelling o but denies any pain and admits that he did wear his cam boot as instructed during the work week over the last 2 weeks and states that his ankle does feel stronger after he has wore the boot however still experiences swelling especially when he is in brace the swelling is pushed to his toes.  Patient denies any other acute symptoms at this time. Fasting blood sugar this morning was 142, A1c 7.2 and he last saw his primary care doctor 2 months ago  Patient Active Problem List   Diagnosis Date Noted  . GERD (gastroesophageal reflux disease) 12/27/2015  . Arthritis 11/16/2015  . Diabetes mellitus (HCC) 11/16/2015  . Hyperlipidemia 11/16/2015  . Localized edema 11/16/2015  . OBESITY 05/15/2007  . ADD 05/15/2007  . HYPERTENSION 05/15/2007  . Allergic rhinitis 05/15/2007  . SLEEP APNEA 05/15/2007  . COUGH 05/15/2007   Current Outpatient Medications on File Prior to Visit  Medication Sig Dispense Refill  . aspirin EC 81 MG tablet Take 81 mg by mouth daily.    . Cetirizine HCl (ZYRTEC ALLERGY PO) Take 10 mg by mouth daily.    Marland Kitchen glimepiride (AMARYL) 4 MG tablet Take 4 mg by mouth daily with breakfast.     . insulin NPH-regular Human (NOVOLIN 70/30) (70-30) 100 UNIT/ML injection Inject 40 Units into the skin 2 (two) times daily with a meal.    . metFORMIN (GLUCOPHAGE-XR) 500 MG 24 hr tablet Take 1,000 mg by mouth 2 (two) times daily.     . montelukast (SINGULAIR) 10 MG tablet TAKE 1 TABLET BY MOUTH AT  BEDTIME 30 tablet 3  . pantoprazole (PROTONIX) 40 MG tablet TAKE 1 TABLET BY MOUTH TWICE DAILY 60 tablet 3  . ranitidine (ZANTAC) 150 MG tablet Take 1 tablet (150 mg total) by mouth 2 (two) times daily. 60 tablet 2  . silver nitrate applicators 75-25 % applicator Apply topically 3 (three) times a week. 100 each 0  . simvastatin (ZOCOR) 40 MG tablet TAKE 1 TABLET BY MOUTH ONCE DAILY IN THE EVENING FOR 30 DAYS  11  . Spacer/Aero-Holding Chambers (AEROCHAMBER MV) inhaler Use as instructed 1 each 0  . sulfamethoxazole-trimethoprim (BACTRIM DS,SEPTRA DS) 800-160 MG tablet Take 1 tablet by mouth 2 (two) times daily. 28 tablet 0   No current facility-administered medications on file prior to visit.    No Known Allergies  No results found for this or any previous visit (from the past 2160 hour(s)).  Objective: General: Patient is awake, alert, and oriented x 3 and in no acute distress.  Integument: Skin is warm, dry and supple bilateral. Nails are short thickened and  dystrophic with subungual debris, consistent with onychomycosis, 1-5 on left and 2-5 on right. Right plantar hallux toe ulceration measures 1.2 x 0.8cm with 100% granular base post debridement and decreased maceration and mild reactive keratosis, no malodor, blanchable erythema to the level of the interphalangeal joint, + minimal callus distal tuft of right 2nd toe. Remaining integument unremarkable.  Vasculature:  Dorsalis Pedis pulse 1/4 bilateral. Posterior Tibial pulse  1/4 bilateral.  Capillary fill time <3 sec 1-5 bilateral.  Minimal hair growth to the level of the digits.Temperature gradient within normal limits. No varicosities present bilateral.  Neurology: The patient has absent sensation measured with a 5.07/10g Semmes Weinstein Monofilament at all pedal sites bilateral. Vibratory sensation diminished bilateral with tuning fork. No Babinski sign present bilateral.   Musculoskeletal: No tenderness to palpation to the  ulceration site. There is digital contracture.  No recurrent tenderness to left ankle however there is significant swelling and improved stability.  Assessment and Plan: Problem List Items Addressed This Visit      Endocrine   Diabetes mellitus (HCC)    Other Visit Diagnoses    Foot ulcer, right, limited to breakdown of skin (HCC)    -  Primary   PAD (peripheral artery disease) (HCC)       Onychomycosis       Relevant Orders   Hepatic Function Panel     -Examined patient. -Re-Discussed and re-educated patient on diabetic foot care, especially with  regards to the vascular, neurological and musculoskeletal systems.  - Excisionally dedbrided ulceration at right great toe to healthy bleeding borders removing nonviable tissue using a sterile chisel blade and saline moistened gauze. Wound measures post debridement as above wound was debrided to the level of the dermis with viable wound base exposed to promote healing. Hemostasis was achieved with manuel pressure. Patient tolerated procedure well without any discomfort or anesthesia necessary for this wound debridement.   -Applied Prisma and tube foam dressing and instructed patient to continue with dressing changes consisting of the same -Advised ankle compression sleeve for edema control  - Advised patient to go to the ER or return to office if the wound worsens or if constitutional symptoms are present. -Rx LFTs and advised patient if normal can send lamisil to pharmacy for concern of nail fungus  -Answered all patient questions -Return to office in 2-3 weeks for follow-up wound care.  [name], DPM

## 2017-12-17 DIAGNOSIS — E113411 Type 2 diabetes mellitus with severe nonproliferative diabetic retinopathy with macular edema, right eye: Secondary | ICD-10-CM | POA: Diagnosis not present

## 2017-12-17 DIAGNOSIS — H2511 Age-related nuclear cataract, right eye: Secondary | ICD-10-CM | POA: Diagnosis not present

## 2017-12-17 DIAGNOSIS — E113412 Type 2 diabetes mellitus with severe nonproliferative diabetic retinopathy with macular edema, left eye: Secondary | ICD-10-CM | POA: Diagnosis not present

## 2017-12-17 DIAGNOSIS — H3561 Retinal hemorrhage, right eye: Secondary | ICD-10-CM | POA: Diagnosis not present

## 2017-12-23 DIAGNOSIS — B351 Tinea unguium: Secondary | ICD-10-CM | POA: Diagnosis not present

## 2017-12-24 ENCOUNTER — Telehealth: Payer: Self-pay | Admitting: *Deleted

## 2017-12-24 LAB — HEPATIC FUNCTION PANEL
ALT: 16 IU/L (ref 0–44)
AST: 13 IU/L (ref 0–40)
Albumin: 4.2 g/dL (ref 3.5–5.5)
Alkaline Phosphatase: 90 IU/L (ref 39–117)
Bilirubin Total: 0.7 mg/dL (ref 0.0–1.2)
Bilirubin, Direct: 0.17 mg/dL (ref 0.00–0.40)
Total Protein: 6.5 g/dL (ref 6.0–8.5)

## 2017-12-24 MED ORDER — TERBINAFINE HCL 250 MG PO TABS: 250.0000 mg | ORAL_TABLET | Freq: Every day | ORAL | 0 refills | Status: DC

## 2017-12-24 NOTE — Telephone Encounter (Signed)
-----   Message from [name], [place] sent at 12/24/2017  7:04 AM EST ----- LFTs normal. Send to pharmacy Lamisil 250mg  PO one tab daily x 90 tabs  Thanks Dr. Marylene Land

## 2017-12-24 NOTE — Telephone Encounter (Signed)
I informed pt of Dr. [name]'s review of results and orders. Pt states understanding. 

## 2018-01-03 ENCOUNTER — Encounter: Payer: Self-pay | Admitting: Sports Medicine

## 2018-01-03 ENCOUNTER — Ambulatory Visit: Payer: BLUE CROSS/BLUE SHIELD | Admitting: Sports Medicine

## 2018-01-03 VITALS — BP 178/102 | HR 88 | Temp 96.5°F | Resp 16

## 2018-01-03 DIAGNOSIS — L97511 Non-pressure chronic ulcer of other part of right foot limited to breakdown of skin: Secondary | ICD-10-CM

## 2018-01-03 DIAGNOSIS — E1140 Type 2 diabetes mellitus with diabetic neuropathy, unspecified: Secondary | ICD-10-CM | POA: Diagnosis not present

## 2018-01-03 NOTE — Progress Notes (Signed)
Subjective: [name] is a 57 y.o. male patient with history of diabetes who returns to office today for follow up evaluation of right great toe ulceration. Patient reports no pain to the ulcerated area however there is still a little bit of drainage and reports that he cannot tell if it is better not but has not caused him any problems.  Reports that he also started his Lamisil on 1114 for the fungal changes in his nails and has been consistent with dressing his right great toe using Prisma and he is offloading padding.  Reports that his blood sugar today was 155 his last A1c was 7.2 states that the first few days of taking the Lamisil he did have a little bit of diarrhea but nothing more since then.  Denies nausea vomiting fever chills.  No other pedal complaints or issues noted at this time. Patient Active Problem List   Diagnosis Date Noted  . GERD (gastroesophageal reflux disease) 12/27/2015  . Arthritis 11/16/2015  . Diabetes mellitus (HCC) 11/16/2015  . Hyperlipidemia 11/16/2015  . Localized edema 11/16/2015  . OBESITY 05/15/2007  . ADD 05/15/2007  . HYPERTENSION 05/15/2007  . Allergic rhinitis 05/15/2007  . SLEEP APNEA 05/15/2007  . COUGH 05/15/2007   Current Outpatient Medications on File Prior to Visit  Medication Sig Dispense Refill  . aspirin EC 81 MG tablet Take 81 mg by mouth daily.    . Cetirizine HCl (ZYRTEC ALLERGY PO) Take 10 mg by mouth daily.    Marland Kitchen. glimepiride (AMARYL) 4 MG tablet Take 4 mg by mouth daily with breakfast.     . insulin NPH-regular Human (NOVOLIN 70/30) (70-30) 100 UNIT/ML injection Inject 40 Units into the skin 2 (two) times daily with a meal.    . metFORMIN (GLUCOPHAGE-XR) 500 MG 24 hr tablet Take 1,000 mg by mouth 2 (two) times daily.     . montelukast (SINGULAIR) 10 MG tablet TAKE 1 TABLET BY MOUTH AT BEDTIME 30 tablet 3  . pantoprazole (PROTONIX) 40 MG tablet TAKE 1 TABLET BY MOUTH TWICE DAILY 60 tablet 3  . ranitidine (ZANTAC) 150 MG tablet Take  1 tablet (150 mg total) by mouth 2 (two) times daily. 60 tablet 2  . silver nitrate applicators 75-25 % applicator Apply topically 3 (three) times a week. 100 each 0  . simvastatin (ZOCOR) 40 MG tablet TAKE 1 TABLET BY MOUTH ONCE DAILY IN THE EVENING FOR 30 DAYS  11  . Spacer/Aero-Holding Chambers (AEROCHAMBER MV) inhaler Use as instructed 1 each 0  . sulfamethoxazole-trimethoprim (BACTRIM DS,SEPTRA DS) 800-160 MG tablet Take 1 tablet by mouth 2 (two) times daily. 28 tablet 0  . terbinafine (LAMISIL) 250 MG tablet Take 1 tablet (250 mg total) by mouth daily. 90 tablet 0   No current facility-administered medications on file prior to visit.    No Known Allergies  Recent Results (from the past 2160 hour(s))  Hepatic Function Panel     Status: None   Collection Time: 12/23/17  3:57 PM  Result Value Ref Range   Total Protein 6.5 6.0 - 8.5 g/dL   Albumin 4.2 3.5 - 5.5 g/dL   Bilirubin Total 0.7 0.0 - 1.2 mg/dL   Bilirubin, Direct 1.610.17 0.00 - 0.40 mg/dL   Alkaline Phosphatase 90 39 - 117 IU/L   AST 13 0 - 40 IU/L   ALT 16 0 - 44 IU/L    Objective: General: Patient is awake, alert, and oriented x 3 and in no acute distress.  Integument:  Skin is warm, dry and supple bilateral. Nails are short thickened and  dystrophic with subungual debris, consistent with onychomycosis, 1-5 on left and 2-5 on right. Right plantar hallux toe ulceration measures 0.8 x 0.6 cm with 100% granular base post debridement and decreased maceration and mild reactive keratosis, no malodor, blanchable erythema to the level of the interphalangeal joint, + minimal callus distal tuft of right 2nd toe. Remaining integument unremarkable.  Vasculature:  Dorsalis Pedis pulse 1/4 bilateral. Posterior Tibial pulse  1/4 bilateral.  Capillary fill time <3 sec 1-5 bilateral.  Minimal hair growth to the level of the digits.Temperature gradient within normal limits. No varicosities present bilateral.  Neurology: The patient has  absent sensation measured with a 5.07/10g Semmes Weinstein Monofilament at all pedal sites bilateral. Vibratory sensation diminished bilateral with tuning fork. No Babinski sign present bilateral.   Musculoskeletal: No tenderness to palpation to the ulceration site. There is digital contracture.  No recurrent tenderness to left ankle however there is significant swelling and improved stability.  Assessment and Plan: Problem List Items Addressed This Visit      Endocrine   Diabetes mellitus (HCC)    Other Visit Diagnoses    Foot ulcer, right, limited to breakdown of skin (HCC)    -  Primary     -Examined patient. -Re-Discussed and re-educated patient on diabetic foot care, especially with  regards to the vascular, neurological and musculoskeletal systems.  - Excisionally dedbrided ulceration at right great toe to healthy bleeding borders removing nonviable tissue using a sterile chisel blade and saline moistened gauze. Wound measures post debridement as above wound was debrided to the level of the dermis with viable wound base exposed to promote healing. Hemostasis was achieved with manuel pressure. Patient tolerated procedure well without any discomfort or anesthesia necessary for this wound debridement.   -Applied Prisma and tube foam dressing and instructed patient to continue with dressing changes consisting of the same -Advised patient if wound fails to improve will consider total contact casting -Advised ankle compression sleeve for edema control  - Advised patient to go to the ER or return to office if the wound worsens or if constitutional symptoms are present. -Continue PO lamisil and and 5 weeks will repeat liver function test most recent tests have been normal -Answered all patient questions -Return to office in 2-3 weeks for follow-up wound care.  [name], DPM

## 2018-01-22 DIAGNOSIS — H2511 Age-related nuclear cataract, right eye: Secondary | ICD-10-CM | POA: Diagnosis not present

## 2018-01-22 DIAGNOSIS — E113411 Type 2 diabetes mellitus with severe nonproliferative diabetic retinopathy with macular edema, right eye: Secondary | ICD-10-CM | POA: Diagnosis not present

## 2018-01-22 DIAGNOSIS — H3561 Retinal hemorrhage, right eye: Secondary | ICD-10-CM | POA: Diagnosis not present

## 2018-01-22 DIAGNOSIS — E113412 Type 2 diabetes mellitus with severe nonproliferative diabetic retinopathy with macular edema, left eye: Secondary | ICD-10-CM | POA: Diagnosis not present

## 2018-01-24 ENCOUNTER — Ambulatory Visit (INDEPENDENT_AMBULATORY_CARE_PROVIDER_SITE_OTHER): Payer: BLUE CROSS/BLUE SHIELD

## 2018-01-24 ENCOUNTER — Encounter: Payer: Self-pay | Admitting: Sports Medicine

## 2018-01-24 ENCOUNTER — Ambulatory Visit: Payer: BLUE CROSS/BLUE SHIELD | Admitting: Sports Medicine

## 2018-01-24 DIAGNOSIS — L97511 Non-pressure chronic ulcer of other part of right foot limited to breakdown of skin: Secondary | ICD-10-CM

## 2018-01-24 DIAGNOSIS — E1140 Type 2 diabetes mellitus with diabetic neuropathy, unspecified: Secondary | ICD-10-CM

## 2018-01-24 DIAGNOSIS — M2041 Other hammer toe(s) (acquired), right foot: Secondary | ICD-10-CM

## 2018-01-24 DIAGNOSIS — I739 Peripheral vascular disease, unspecified: Secondary | ICD-10-CM | POA: Diagnosis not present

## 2018-01-24 NOTE — Progress Notes (Signed)
Subjective: [name] is a 57 y.o. male patient with history of diabetes who returns to office today for follow up evaluation of right great toe ulceration. Patient reports that he thinks that the wound has gotten worse on Tuesday felt like a new ulcer had developed adjacent to the original ulcer because he noticed bloody drainage and has had a little bit of redness but denies any pain nausea vomiting fever or chills.  Patient reports that his blood sugar this morning was 162 and his last A1c was 7.2 and has been consistent with dressing the area at his right great toe using Prisma and he is offloading padding.  Patient denies any problems with Lamisil and has been on his medication for roughly 1 month.  No other pedal complaints or issues noted at this time.  Patient Active Problem List   Diagnosis Date Noted  . GERD (gastroesophageal reflux disease) 12/27/2015  . Arthritis 11/16/2015  . Diabetes mellitus (HCC) 11/16/2015  . Hyperlipidemia 11/16/2015  . Localized edema 11/16/2015  . OBESITY 05/15/2007  . ADD 05/15/2007  . HYPERTENSION 05/15/2007  . Allergic rhinitis 05/15/2007  . SLEEP APNEA 05/15/2007  . COUGH 05/15/2007   Current Outpatient Medications on File Prior to Visit  Medication Sig Dispense Refill  . aspirin EC 81 MG tablet Take 81 mg by mouth daily.    . Cetirizine HCl (ZYRTEC ALLERGY PO) Take 10 mg by mouth daily.    Marland Kitchen. glimepiride (AMARYL) 4 MG tablet Take 4 mg by mouth daily with breakfast.     . insulin NPH-regular Human (NOVOLIN 70/30) (70-30) 100 UNIT/ML injection Inject 40 Units into the skin 2 (two) times daily with a meal.    . metFORMIN (GLUCOPHAGE-XR) 500 MG 24 hr tablet Take 1,000 mg by mouth 2 (two) times daily.     . montelukast (SINGULAIR) 10 MG tablet TAKE 1 TABLET BY MOUTH AT BEDTIME 30 tablet 3  . pantoprazole (PROTONIX) 40 MG tablet TAKE 1 TABLET BY MOUTH TWICE DAILY 60 tablet 3  . ranitidine (ZANTAC) 150 MG tablet Take 1 tablet (150 mg total) by mouth 2  (two) times daily. 60 tablet 2  . silver nitrate applicators 75-25 % applicator Apply topically 3 (three) times a week. 100 each 0  . simvastatin (ZOCOR) 40 MG tablet TAKE 1 TABLET BY MOUTH ONCE DAILY IN THE EVENING FOR 30 DAYS  11  . Spacer/Aero-Holding Chambers (AEROCHAMBER MV) inhaler Use as instructed 1 each 0  . sulfamethoxazole-trimethoprim (BACTRIM DS,SEPTRA DS) 800-160 MG tablet Take 1 tablet by mouth 2 (two) times daily. 28 tablet 0  . terbinafine (LAMISIL) 250 MG tablet Take 1 tablet (250 mg total) by mouth daily. 90 tablet 0   No current facility-administered medications on file prior to visit.    No Known Allergies  Recent Results (from the past 2160 hour(s))  Hepatic Function Panel     Status: None   Collection Time: 12/23/17  3:57 PM  Result Value Ref Range   Total Protein 6.5 6.0 - 8.5 g/dL   Albumin 4.2 3.5 - 5.5 g/dL   Bilirubin Total 0.7 0.0 - 1.2 mg/dL   Bilirubin, Direct 1.610.17 0.00 - 0.40 mg/dL   Alkaline Phosphatase 90 39 - 117 IU/L   AST 13 0 - 40 IU/L   ALT 16 0 - 44 IU/L    Objective: General: Patient is awake, alert, and oriented x 3 and in no acute distress.  Integument: Skin is warm, dry and supple bilateral. Nails are short  thickened and  dystrophic with subungual debris, consistent with onychomycosis, 1-5 on left and 2-5 on right. Right plantar hallux toe ulceration measures 2 x 0.9 cm with 100% granular base post debridement and wound maceration and moderate reactive keratosis, no malodor, blanchable erythema to the level of the interphalangeal joint, + minimal callus distal tuft of right 2nd toe. Remaining integument unremarkable.  Vasculature:  Dorsalis Pedis pulse 1/4 bilateral. Posterior Tibial pulse  1/4 bilateral.  Capillary fill time <3 sec 1-5 bilateral.  Minimal hair growth to the level of the digits.Temperature gradient within normal limits. No varicosities present bilateral.  Neurology: The patient has absent sensation measured with a 5.07/10g  Semmes Weinstein Monofilament at all pedal sites bilateral. Vibratory sensation diminished bilateral with tuning fork. No Babinski sign present bilateral.   Musculoskeletal: No tenderness to palpation to the ulceration site. There is digital contracture with the right first digit being most contracted consistent with hammertoe.   Assessment and Plan: Problem List Items Addressed This Visit      Endocrine   Diabetes mellitus (HCC)   Relevant Orders   DG Foot Complete Right   WOUND CULTURE    Other Visit Diagnoses    Foot ulcer, right, limited to breakdown of skin (HCC)    -  Primary   Relevant Orders   DG Foot Complete Right   WOUND CULTURE   Hammertoe of right foot         -Examined patient. -X-rays reviewed with no underlying osseous components present significant hammertoe deformity -Re-Discussed and re-educated patient on diabetic foot care, especially with  regards to the vascular, neurological and musculoskeletal systems.  - Excisionally dedbrided ulceration at right great toe to healthy bleeding borders removing nonviable tissue using a sterile chisel blade and saline moistened gauze. Wound measures post debridement as above wound was debrided to the level of the dermis with viable wound base exposed to promote healing. Hemostasis was achieved with manuel pressure. Patient tolerated procedure well without any discomfort or anesthesia necessary for this wound debridement.   -Wound culture obtained will call patient if need to start antibiotics -Applied Silvadene cream and dancers pad to offload first metatarsal to decrease plantar pressure at hallux and instructed patient to continue with dressing changes consisting of the same -Advised patient if wound fails to improve will consider in office tenotomy and possible total contact casting - Advised patient to go to the ER or return to office if the wound worsens or if constitutional symptoms are present. -Continue PO lamisil and at  next visit will repeat LFTs -Answered all patient questions -Return to office in 2-3 weeks for follow-up wound care and in office tenotomy procedure.  [name], DPM

## 2018-01-29 ENCOUNTER — Telehealth: Payer: Self-pay | Admitting: *Deleted

## 2018-01-29 LAB — WOUND CULTURE

## 2018-01-29 MED ORDER — SULFAMETHOXAZOLE-TRIMETHOPRIM 800-160 MG PO TABS: 1.0000 | ORAL_TABLET | Freq: Two times a day (BID) | ORAL | 0 refills | Status: DC

## 2018-01-29 NOTE — Telephone Encounter (Signed)
-----   Message from Asencion Islamitorya [name], [place] sent at 01/29/2018  4:50 PM EST ----- Culture + for stap please send Bactrim again to patient pharmacy 28 tabs Thanks Dr. Marylene LandStover

## 2018-01-29 NOTE — Telephone Encounter (Signed)
I informed pt of DR. [name]'s 01/29/2018 at 4:50pm orders.

## 2018-02-03 ENCOUNTER — Other Ambulatory Visit: Payer: Self-pay | Admitting: Sports Medicine

## 2018-02-03 DIAGNOSIS — E1140 Type 2 diabetes mellitus with diabetic neuropathy, unspecified: Secondary | ICD-10-CM

## 2018-02-03 DIAGNOSIS — L97511 Non-pressure chronic ulcer of other part of right foot limited to breakdown of skin: Secondary | ICD-10-CM

## 2018-02-03 DIAGNOSIS — M2041 Other hammer toe(s) (acquired), right foot: Secondary | ICD-10-CM

## 2018-02-14 ENCOUNTER — Encounter: Payer: Self-pay | Admitting: Sports Medicine

## 2018-02-14 ENCOUNTER — Ambulatory Visit: Payer: BLUE CROSS/BLUE SHIELD | Admitting: Sports Medicine

## 2018-02-14 DIAGNOSIS — E1140 Type 2 diabetes mellitus with diabetic neuropathy, unspecified: Secondary | ICD-10-CM

## 2018-02-14 DIAGNOSIS — M2041 Other hammer toe(s) (acquired), right foot: Secondary | ICD-10-CM

## 2018-02-14 DIAGNOSIS — M6240 Contracture of muscle, unspecified site: Secondary | ICD-10-CM | POA: Diagnosis not present

## 2018-02-14 DIAGNOSIS — L97511 Non-pressure chronic ulcer of other part of right foot limited to breakdown of skin: Secondary | ICD-10-CM

## 2018-02-14 MED ORDER — SULFAMETHOXAZOLE-TRIMETHOPRIM 800-160 MG PO TABS: 1.0000 | ORAL_TABLET | Freq: Two times a day (BID) | ORAL | 0 refills | Status: DC

## 2018-02-14 NOTE — Progress Notes (Signed)
DATE OF PROCEDURE: 02-14-18  PREOPERATIVE DIAGNOSES: 1 Contracture of tendon 2. Hallux hammertoe 3. Chronic Diabetic foot ulcer at right 1st toe  POSTOPERATIVE DIAGNOSES: 1. Same  ANESTHESIA: 6cc's 1% lidocaine plain and  0.5% marcaine plain in a hallux block fashion.  HEMOSTASIS: Manuel pressure   ESTIMATED BLOOD LOSS: Less than 5 mL.  MATERIALS USED: 18 g needle and chisel blade  PROCEDURE PERFORMED: Flexor tenotomy right great toe  DESCRIPTION OF THE PROCEDURE: The patient was brought to the in office treatment room and placed in the  chair in the supine position.  Procedure was thoroughly explained to patient and the consent was obtained.  The right foot was then prepped using Betadine and a preoperative injection consisting of 6 cc of 1:1 mixture 1% lidocaine plain and 0.5% Marcaine plain was administered in a local field block fashion following anesthesia was then confirmed utilizing a 1 to pick up and then utilizing a chisel blade the ulcer at the distal tuft of the right hallux was then deeply debrided removing all nonviable keratotic tissue exposing a wound bed that measures 2 x 1 cm with a granular base.  Bleeders were managed with manual pressure and topical Lumicain once all bleeders were cauterized attention was then directed to the plantar aspect of the right great toe where there was digital contracture noted of the flexor tendon with subsequent hammertoe deformity thus utilizing a 18-gauge needle at the plantar aspect of the interphalangeal joint a small stab incision was made with the needle and the tendon was slowly released and a small chisel fashion using the sharp point of the 18-gauge needle while applying dorsal pressure to the great toe to fully release the tendon of the flexor hallucis longus.  Once sufficient release was noted then the area was then thoroughly cleansed and Betadine was applied to the surgical wound and the existing chronic diabetic foot ulceration and  dressed with Steri-Strips, copious amounts of fluff and Kling, stockinette, and Coban bandage.  Patient was advised to wear loose fitting tennis shoe or return to postoperative shoe.  Patient tolerated the procedure well.  There were no complications.  The patient was then held in postanesthesia waiting area with vital signs stable and the vascular status at appropriate levels. The patient was given specific instructions and education on how to continue caring for his wound to apply betadine and steristips to splint the toe and resume bactrim; refill provided at this visit. The patient was eventually discharged from office and advised to follow up with Dr. Marylene Land in one week's time for his first postoperative appointment.   [name], DPM  At next visit we will also order liver function test since patient is almost halfway finished with his course of Lamisil for nail fungus.

## 2018-02-20 DIAGNOSIS — R3129 Other microscopic hematuria: Secondary | ICD-10-CM | POA: Diagnosis not present

## 2018-02-20 DIAGNOSIS — E1140 Type 2 diabetes mellitus with diabetic neuropathy, unspecified: Secondary | ICD-10-CM | POA: Diagnosis not present

## 2018-02-20 DIAGNOSIS — E119 Type 2 diabetes mellitus without complications: Secondary | ICD-10-CM | POA: Diagnosis not present

## 2018-02-20 DIAGNOSIS — E7800 Pure hypercholesterolemia, unspecified: Secondary | ICD-10-CM | POA: Diagnosis not present

## 2018-02-20 DIAGNOSIS — F331 Major depressive disorder, recurrent, moderate: Secondary | ICD-10-CM | POA: Diagnosis not present

## 2018-02-20 DIAGNOSIS — Z0000 Encounter for general adult medical examination without abnormal findings: Secondary | ICD-10-CM | POA: Diagnosis not present

## 2018-02-20 DIAGNOSIS — G4730 Sleep apnea, unspecified: Secondary | ICD-10-CM | POA: Diagnosis not present

## 2018-02-20 DIAGNOSIS — Z125 Encounter for screening for malignant neoplasm of prostate: Secondary | ICD-10-CM | POA: Diagnosis not present

## 2018-02-20 DIAGNOSIS — I10 Essential (primary) hypertension: Secondary | ICD-10-CM | POA: Diagnosis not present

## 2018-02-21 ENCOUNTER — Encounter: Payer: Self-pay | Admitting: Sports Medicine

## 2018-02-21 ENCOUNTER — Ambulatory Visit (INDEPENDENT_AMBULATORY_CARE_PROVIDER_SITE_OTHER): Payer: BLUE CROSS/BLUE SHIELD | Admitting: Sports Medicine

## 2018-02-21 VITALS — BP 181/107 | HR 84 | Temp 97.4°F | Resp 16

## 2018-02-21 DIAGNOSIS — L97511 Non-pressure chronic ulcer of other part of right foot limited to breakdown of skin: Secondary | ICD-10-CM

## 2018-02-21 DIAGNOSIS — Z9889 Other specified postprocedural states: Secondary | ICD-10-CM

## 2018-02-21 DIAGNOSIS — E1140 Type 2 diabetes mellitus with diabetic neuropathy, unspecified: Secondary | ICD-10-CM

## 2018-02-21 NOTE — Progress Notes (Signed)
Subjective: [name] is a 58 y.o. male patient seen today in office for POV #1 (DOS 02/14/2018), S/P right hallux flexor tenotomy.  Patient denies pain at surgical site, denies calf pain, denies headache, chest pain, shortness of breath, nausea, vomiting, fever, or chills. Patient states that he is doing well and feels like there is less drainage and seems to be doing better states that he is currently taking his Bactrim as prescribed and using Betadine and Steri-Strips as instructed last visit. No other issues noted.   Fasting blood sugar this morning 132  Patient Active Problem List   Diagnosis Date Noted  . GERD (gastroesophageal reflux disease) 12/27/2015  . Arthritis 11/16/2015  . Diabetes mellitus (HCC) 11/16/2015  . Hyperlipidemia 11/16/2015  . Localized edema 11/16/2015  . OBESITY 05/15/2007  . ADD 05/15/2007  . HYPERTENSION 05/15/2007  . Allergic rhinitis 05/15/2007  . SLEEP APNEA 05/15/2007  . COUGH 05/15/2007    Current Outpatient Medications on File Prior to Visit  Medication Sig Dispense Refill  . aspirin EC 81 MG tablet Take 81 mg by mouth daily.    . Cetirizine HCl (ZYRTEC ALLERGY PO) Take 10 mg by mouth daily.    Marland Kitchen glimepiride (AMARYL) 4 MG tablet Take 4 mg by mouth daily with breakfast.     . insulin NPH-regular Human (NOVOLIN 70/30) (70-30) 100 UNIT/ML injection Inject 40 Units into the skin 2 (two) times daily with a meal.    . metFORMIN (GLUCOPHAGE-XR) 500 MG 24 hr tablet Take 1,000 mg by mouth 2 (two) times daily.     . montelukast (SINGULAIR) 10 MG tablet TAKE 1 TABLET BY MOUTH AT BEDTIME 30 tablet 3  . pantoprazole (PROTONIX) 40 MG tablet TAKE 1 TABLET BY MOUTH TWICE DAILY 60 tablet 3  . ranitidine (ZANTAC) 150 MG tablet Take 1 tablet (150 mg total) by mouth 2 (two) times daily. 60 tablet 2  . silver nitrate applicators 75-25 % applicator Apply topically 3 (three) times a week. 100 each 0  . simvastatin (ZOCOR) 40 MG tablet TAKE 1 TABLET BY MOUTH ONCE DAILY  IN THE EVENING FOR 30 DAYS  11  . Spacer/Aero-Holding Chambers (AEROCHAMBER MV) inhaler Use as instructed 1 each 0  . sulfamethoxazole-trimethoprim (BACTRIM DS,SEPTRA DS) 800-160 MG tablet Take 1 tablet by mouth 2 (two) times daily. 28 tablet 0  . sulfamethoxazole-trimethoprim (BACTRIM DS,SEPTRA DS) 800-160 MG tablet Take 1 tablet by mouth 2 (two) times daily. 28 tablet 0  . terbinafine (LAMISIL) 250 MG tablet Take 1 tablet (250 mg total) by mouth daily. 90 tablet 0   No current facility-administered medications on file prior to visit.     No Known Allergies  Objective: There were no vitals filed for this visit.  General: No acute distress, AAOx3  Right foot: Partial thickness ulceration plantar hallux appears to be improving in size about 50% better measuring today 0.5 x 0.5 at the proximal aspect of the wound with a skin island and measuring 1 cm at the distal aspect of the wound with a healthy red granular base, focal edema, no erythema, no warmth, no drainage, no other signs of infection noted, Capillary fill time <3 seconds in all digits, gross sensation present via light touch to right foot however protective sensation absent secondary to diabetes with neuropathy.  No pain with calf compression.   Assessment and Plan:  Problem List Items Addressed This Visit      Endocrine   Diabetes mellitus (HCC)    Other Visit Diagnoses  S/P foot surgery, right    -  Primary   Foot ulcer, right, limited to breakdown of skin (HCC)           -Patient seen and evaluated -Wound check performed and mechanically debrided using a sterile chisel blade the wound to healthy bleeding margins.  Hemostasis was achieved with manual pressure and then redressed area using Steri-Strips Betadine and dry dressing -Advised patient to continue with dressings consisting of the same -Continue with Bactrim antibiotics -Continue with closely monitoring and if constitutional symptoms are present to return to office  or report to emergency room -Will plan for wound check at next office visit. In the meantime, patient to call office if any issues or problems arise.   [name], DPM

## 2018-02-28 ENCOUNTER — Ambulatory Visit (INDEPENDENT_AMBULATORY_CARE_PROVIDER_SITE_OTHER): Payer: BLUE CROSS/BLUE SHIELD | Admitting: Sports Medicine

## 2018-02-28 ENCOUNTER — Encounter: Payer: Self-pay | Admitting: Sports Medicine

## 2018-02-28 VITALS — BP 172/90 | HR 91 | Temp 98.0°F | Resp 16

## 2018-02-28 DIAGNOSIS — M6240 Contracture of muscle, unspecified site: Secondary | ICD-10-CM

## 2018-02-28 DIAGNOSIS — M2041 Other hammer toe(s) (acquired), right foot: Secondary | ICD-10-CM

## 2018-02-28 DIAGNOSIS — L97511 Non-pressure chronic ulcer of other part of right foot limited to breakdown of skin: Secondary | ICD-10-CM

## 2018-02-28 DIAGNOSIS — Z9889 Other specified postprocedural states: Secondary | ICD-10-CM

## 2018-02-28 DIAGNOSIS — E1140 Type 2 diabetes mellitus with diabetic neuropathy, unspecified: Secondary | ICD-10-CM

## 2018-02-28 MED ORDER — SULFAMETHOXAZOLE-TRIMETHOPRIM 800-160 MG PO TABS: 1.0000 | ORAL_TABLET | Freq: Two times a day (BID) | ORAL | 0 refills | Status: DC

## 2018-02-28 NOTE — Progress Notes (Addendum)
Subjective: [name] is a 58 y.o. male patient seen today in office for POV #2 (DOS 02/14/2018), S/P right hallux flexor tenotomy.  Patient denies pain at surgical site, denies calf pain, denies headache, chest pain, shortness of breath, nausea, vomiting, fever, or chills. Patient states that he is doing well and feels like the wound is healing up.  Patient is taking his Bactrim as prescribed and using Betadine and Steri-Strips as instructed last visit. No other issues noted.   Fasting blood sugar this morning 181  Patient Active Problem List   Diagnosis Date Noted  . GERD (gastroesophageal reflux disease) 12/27/2015  . Arthritis 11/16/2015  . Diabetes mellitus (HCC) 11/16/2015  . Hyperlipidemia 11/16/2015  . Localized edema 11/16/2015  . OBESITY 05/15/2007  . ADD 05/15/2007  . HYPERTENSION 05/15/2007  . Allergic rhinitis 05/15/2007  . SLEEP APNEA 05/15/2007  . COUGH 05/15/2007    Current Outpatient Medications on File Prior to Visit  Medication Sig Dispense Refill  . aspirin EC 81 MG tablet Take 81 mg by mouth daily.    . Cetirizine HCl (ZYRTEC ALLERGY PO) Take 10 mg by mouth daily.    Marland Kitchen glimepiride (AMARYL) 4 MG tablet Take 4 mg by mouth daily with breakfast.     . insulin NPH-regular Human (NOVOLIN 70/30) (70-30) 100 UNIT/ML injection Inject 40 Units into the skin 2 (two) times daily with a meal.    . metFORMIN (GLUCOPHAGE-XR) 500 MG 24 hr tablet Take 1,000 mg by mouth 2 (two) times daily.     . montelukast (SINGULAIR) 10 MG tablet TAKE 1 TABLET BY MOUTH AT BEDTIME 30 tablet 3  . pantoprazole (PROTONIX) 40 MG tablet TAKE 1 TABLET BY MOUTH TWICE DAILY 60 tablet 3  . ranitidine (ZANTAC) 150 MG tablet Take 1 tablet (150 mg total) by mouth 2 (two) times daily. 60 tablet 2  . silver nitrate applicators 75-25 % applicator Apply topically 3 (three) times a week. 100 each 0  . simvastatin (ZOCOR) 40 MG tablet TAKE 1 TABLET BY MOUTH ONCE DAILY IN THE EVENING FOR 30 DAYS  11  .  Spacer/Aero-Holding Chambers (AEROCHAMBER MV) inhaler Use as instructed 1 each 0  . sulfamethoxazole-trimethoprim (BACTRIM DS,SEPTRA DS) 800-160 MG tablet Take 1 tablet by mouth 2 (two) times daily. 28 tablet 0  . sulfamethoxazole-trimethoprim (BACTRIM DS,SEPTRA DS) 800-160 MG tablet Take 1 tablet by mouth 2 (two) times daily. 28 tablet 0  . terbinafine (LAMISIL) 250 MG tablet Take 1 tablet (250 mg total) by mouth daily. 90 tablet 0   No current facility-administered medications on file prior to visit.     No Known Allergies  Objective: There were no vitals filed for this visit.  General: No acute distress, AAOx3  Right foot: Partial thickness ulceration plantar hallux appears to be improving in size measuring today 0.5 x 0.3 cm at the proximal aspect of the wound with a skin island and measuring 0.5 cm at the distal aspect of the wound with a healthy red granular base, focal edema, no erythema, no warmth, no drainage, no other signs of infection noted, Capillary fill time <3 seconds in all digits, gross sensation present via light touch to right foot however protective sensation absent secondary to diabetes with neuropathy.  No pain with calf compression.   Assessment and Plan:  Problem List Items Addressed This Visit      Endocrine   Diabetes mellitus (HCC)    Other Visit Diagnoses    S/P foot surgery, right    -  Primary   Foot ulcer, right, limited to breakdown of skin (HCC)       Contracture of tendon sheath       Hammertoe of right foot           -Patient seen and evaluated -Wound check performed and mechanically debrided using a sterile chisel blade the wound to healthy bleeding margins.  Hemostasis was achieved with manual pressure and then redressed area using Steri-Strips Betadine and dry dressing -Advised patient to continue with dressings consisting of the same -Continue with postop shoe or tennis shoe that does not rub or irritate toe -Continue with Bactrim  antibiotics -Continue with closely monitoring and if constitutional symptoms are present to return to office or report to emergency room -Will plan for wound check at next office visit. In the meantime, patient to call office if any issues or problems arise.  -Reviewed patient's metabolic blood panel and blood work that he had at last office visit with his PCP all blood work within normal limits safe to continue with Lamisil without stopping therapy.  [name], DPM

## 2018-03-12 DIAGNOSIS — H3561 Retinal hemorrhage, right eye: Secondary | ICD-10-CM | POA: Diagnosis not present

## 2018-03-12 DIAGNOSIS — E113411 Type 2 diabetes mellitus with severe nonproliferative diabetic retinopathy with macular edema, right eye: Secondary | ICD-10-CM | POA: Diagnosis not present

## 2018-03-14 ENCOUNTER — Ambulatory Visit (INDEPENDENT_AMBULATORY_CARE_PROVIDER_SITE_OTHER): Payer: BLUE CROSS/BLUE SHIELD | Admitting: Sports Medicine

## 2018-03-14 ENCOUNTER — Encounter: Payer: Self-pay | Admitting: Sports Medicine

## 2018-03-14 VITALS — BP 176/99 | HR 85 | Temp 97.7°F | Resp 16

## 2018-03-14 DIAGNOSIS — L97511 Non-pressure chronic ulcer of other part of right foot limited to breakdown of skin: Secondary | ICD-10-CM

## 2018-03-14 DIAGNOSIS — Z9889 Other specified postprocedural states: Secondary | ICD-10-CM

## 2018-03-14 DIAGNOSIS — E1140 Type 2 diabetes mellitus with diabetic neuropathy, unspecified: Secondary | ICD-10-CM

## 2018-03-14 DIAGNOSIS — S90411A Abrasion, right great toe, initial encounter: Secondary | ICD-10-CM

## 2018-03-14 NOTE — Progress Notes (Signed)
Subjective: [name] is a 58 y.o. male patient seen today in office for POV #3 (DOS 02/14/2018), S/P right hallux flexor tenotomy.  Patient denies pain at surgical site, denies calf pain, denies headache, chest pain, shortness of breath, nausea, vomiting, fever, or chills. Patient states that he is doing well and feels like the wound is almost healed however he was picking at the skin on his toe top and pulled it and the skin tore.  Patient is finished taking his Bactrim as prescribed and using Betadine and Steri-Strips as instructed last visit. No other issues noted.   Fasting blood sugar this morning 140  Patient Active Problem List   Diagnosis Date Noted  . GERD (gastroesophageal reflux disease) 12/27/2015  . Arthritis 11/16/2015  . Diabetes mellitus (HCC) 11/16/2015  . Hyperlipidemia 11/16/2015  . Localized edema 11/16/2015  . OBESITY 05/15/2007  . ADD 05/15/2007  . HYPERTENSION 05/15/2007  . Allergic rhinitis 05/15/2007  . SLEEP APNEA 05/15/2007  . COUGH 05/15/2007    Current Outpatient Medications on File Prior to Visit  Medication Sig Dispense Refill  . aspirin EC 81 MG tablet Take 81 mg by mouth daily.    . Cetirizine HCl (ZYRTEC ALLERGY PO) Take 10 mg by mouth daily.    Marland Kitchen glimepiride (AMARYL) 4 MG tablet Take 4 mg by mouth daily with breakfast.     . insulin NPH-regular Human (NOVOLIN 70/30) (70-30) 100 UNIT/ML injection Inject 40 Units into the skin 2 (two) times daily with a meal.    . metFORMIN (GLUCOPHAGE-XR) 500 MG 24 hr tablet Take 1,000 mg by mouth 2 (two) times daily.     . montelukast (SINGULAIR) 10 MG tablet TAKE 1 TABLET BY MOUTH AT BEDTIME 30 tablet 3  . pantoprazole (PROTONIX) 40 MG tablet TAKE 1 TABLET BY MOUTH TWICE DAILY 60 tablet 3  . ranitidine (ZANTAC) 150 MG tablet Take 1 tablet (150 mg total) by mouth 2 (two) times daily. 60 tablet 2  . silver nitrate applicators 75-25 % applicator Apply topically 3 (three) times a week. 100 each 0  . simvastatin  (ZOCOR) 40 MG tablet TAKE 1 TABLET BY MOUTH ONCE DAILY IN THE EVENING FOR 30 DAYS  11  . Spacer/Aero-Holding Chambers (AEROCHAMBER MV) inhaler Use as instructed 1 each 0  . sulfamethoxazole-trimethoprim (BACTRIM DS,SEPTRA DS) 800-160 MG tablet Take 1 tablet by mouth 2 (two) times daily. 28 tablet 0  . terbinafine (LAMISIL) 250 MG tablet Take 1 tablet (250 mg total) by mouth daily. 90 tablet 0   No current facility-administered medications on file prior to visit.     No Known Allergies  Objective: There were no vitals filed for this visit.  General: No acute distress, AAOx3  Right foot: Partial thickness ulceration plantar hallux appears to be improving in size measuring today 0.1 x 0.1 cm at the proximal aspect of the wound with a skin island and measuring 0.1 cm at the distal aspect of the wound with a healthy red granular base, focal edema, no erythema, no warmth, no drainage, there is a small abrasion noted to the dorsal right hallux IPJ from patient picking at the skin on his toe causing a self-inflicted wound, no other signs of infection noted, Capillary fill time <3 seconds in all digits, gross sensation present via light touch to right foot however protective sensation absent secondary to diabetes with neuropathy.  No pain with calf compression.   Assessment and Plan:  Problem List Items Addressed This Visit  Endocrine   Diabetes mellitus (HCC)    Other Visit Diagnoses    S/P foot surgery, right    -  Primary   Foot ulcer, right, limited to breakdown of skin (HCC)       Abrasion of right great toe, initial encounter           -Patient seen and evaluated -Wound check performed and mechanically debrided using a sterile chisel blade the wound to healthy bleeding margins.  Hemostasis was achieved with manual pressure and then redressed area using Steri-Strips Betadine and open to air dressing -Advised patient to continue with dressings consisting of the same and to refrain from  picking at the toe -Continue with postop shoe or tennis shoe that does not rub or irritate toe -Continue with closely monitoring and if constitutional symptoms are present to return to office or report to emergency room -Will plan for wound check at next office visit. In the meantime, patient to call office if any issues or problems arise. [name].  Raya Mckinstry, DPM

## 2018-03-28 ENCOUNTER — Ambulatory Visit (INDEPENDENT_AMBULATORY_CARE_PROVIDER_SITE_OTHER): Payer: BLUE CROSS/BLUE SHIELD | Admitting: Sports Medicine

## 2018-03-28 ENCOUNTER — Encounter: Payer: Self-pay | Admitting: Sports Medicine

## 2018-03-28 VITALS — BP 180/102 | HR 87 | Temp 97.7°F | Resp 16

## 2018-03-28 DIAGNOSIS — L97511 Non-pressure chronic ulcer of other part of right foot limited to breakdown of skin: Secondary | ICD-10-CM

## 2018-03-28 DIAGNOSIS — E1140 Type 2 diabetes mellitus with diabetic neuropathy, unspecified: Secondary | ICD-10-CM

## 2018-03-28 DIAGNOSIS — Z9889 Other specified postprocedural states: Secondary | ICD-10-CM | POA: Diagnosis not present

## 2018-03-28 DIAGNOSIS — M6240 Contracture of muscle, unspecified site: Secondary | ICD-10-CM

## 2018-03-28 NOTE — Progress Notes (Signed)
Subjective: [name] is a 58 y.o. male patient seen today in office for POV #4 (DOS 02/14/2018), S/P right hallux flexor tenotomy.  Patient denies pain at surgical site but admits swelling states that after last visit he noticed that there was a little hole that started to drain with blood, denies calf pain, denies headache, chest pain, shortness of breath, nausea, vomiting, fever, or chills.  Patient is using Betadine and Steri-Strips as instructed last visit. No other issues noted.   Fasting blood sugar this morning 148.  Patient Active Problem List   Diagnosis Date Noted  . GERD (gastroesophageal reflux disease) 12/27/2015  . Arthritis 11/16/2015  . Diabetes mellitus (HCC) 11/16/2015  . Hyperlipidemia 11/16/2015  . Localized edema 11/16/2015  . OBESITY 05/15/2007  . ADD 05/15/2007  . HYPERTENSION 05/15/2007  . Allergic rhinitis 05/15/2007  . SLEEP APNEA 05/15/2007  . COUGH 05/15/2007    Current Outpatient Medications on File Prior to Visit  Medication Sig Dispense Refill  . aspirin EC 81 MG tablet Take 81 mg by mouth daily.    . Cetirizine HCl (ZYRTEC ALLERGY PO) Take 10 mg by mouth daily.    Marland Kitchen glimepiride (AMARYL) 4 MG tablet Take 4 mg by mouth daily with breakfast.     . insulin NPH-regular Human (NOVOLIN 70/30) (70-30) 100 UNIT/ML injection Inject 40 Units into the skin 2 (two) times daily with a meal.    . metFORMIN (GLUCOPHAGE-XR) 500 MG 24 hr tablet Take 1,000 mg by mouth 2 (two) times daily.     . montelukast (SINGULAIR) 10 MG tablet TAKE 1 TABLET BY MOUTH AT BEDTIME 30 tablet 3  . pantoprazole (PROTONIX) 40 MG tablet TAKE 1 TABLET BY MOUTH TWICE DAILY 60 tablet 3  . ranitidine (ZANTAC) 150 MG tablet Take 1 tablet (150 mg total) by mouth 2 (two) times daily. 60 tablet 2  . silver nitrate applicators 75-25 % applicator Apply topically 3 (three) times a week. 100 each 0  . simvastatin (ZOCOR) 40 MG tablet TAKE 1 TABLET BY MOUTH ONCE DAILY IN THE EVENING FOR 30 DAYS  11  .  Spacer/Aero-Holding Chambers (AEROCHAMBER MV) inhaler Use as instructed 1 each 0  . sulfamethoxazole-trimethoprim (BACTRIM DS,SEPTRA DS) 800-160 MG tablet Take 1 tablet by mouth 2 (two) times daily. 28 tablet 0  . terbinafine (LAMISIL) 250 MG tablet Take 1 tablet (250 mg total) by mouth daily. 90 tablet 0   No current facility-administered medications on file prior to visit.     No Known Allergies  Objective: There were no vitals filed for this visit.  General: No acute distress, AAOx3  Right foot: Partial thickness ulceration plantar hallux appears to be improving in size measuring today 0.3 x 0.1 cm at the proximal aspect of the wound and is healed at the distal aspect of the wound with a healthy red granular base, focal edema, no erythema, no warmth, no drainage, there is a small abrasion noted to the dorsal right hallux IPJ from patient picking at the skin on his toe causing a self-inflicted wound, no other signs of infection noted, Capillary fill time <3 seconds in all digits, gross sensation present via light touch to right foot however protective sensation absent secondary to diabetes with neuropathy.  No pain with calf compression.   Assessment and Plan:  Problem List Items Addressed This Visit      Endocrine   Diabetes mellitus (HCC)    Other Visit Diagnoses    S/P foot surgery, right    -  Primary   Contracture of tendon sheath       Foot ulcer, right, limited to breakdown of skin (HCC)           -Patient seen and evaluated -Wound check performed and mechanically debrided using a sterile chisel blade the wound to healthy bleeding margins.  Hemostasis was achieved with manual pressure and then redressed area using Steri-Strips Betadine and gauze secured with Coban -Advised patient to continue with dressings consisting of the same  -Continue with postop shoe or tennis shoe that does not rub or irritate toe -Continue with closely monitoring and if constitutional symptoms are  present to return to office or report to emergency room -Will plan for wound check at next office visit. In the meantime, patient to call office if any issues or problems arise. [name], DPM

## 2018-04-07 DIAGNOSIS — G4733 Obstructive sleep apnea (adult) (pediatric): Secondary | ICD-10-CM | POA: Diagnosis not present

## 2018-04-11 ENCOUNTER — Ambulatory Visit: Payer: BLUE CROSS/BLUE SHIELD | Admitting: Sports Medicine

## 2018-04-11 ENCOUNTER — Encounter: Payer: Self-pay | Admitting: Sports Medicine

## 2018-04-11 VITALS — BP 186/105 | HR 96 | Temp 98.0°F | Resp 16

## 2018-04-11 DIAGNOSIS — L97511 Non-pressure chronic ulcer of other part of right foot limited to breakdown of skin: Secondary | ICD-10-CM

## 2018-04-11 DIAGNOSIS — Z9889 Other specified postprocedural states: Secondary | ICD-10-CM

## 2018-04-11 DIAGNOSIS — M6240 Contracture of muscle, unspecified site: Secondary | ICD-10-CM

## 2018-04-11 DIAGNOSIS — E1140 Type 2 diabetes mellitus with diabetic neuropathy, unspecified: Secondary | ICD-10-CM

## 2018-04-11 NOTE — Progress Notes (Signed)
Subjective: [name] is a 58 y.o. male patient seen today in office for POV #5 (DOS 02/14/2018), S/P right hallux flexor tenotomy.  Patient denies pain at surgical site but admits swelling states that he has a small amount of bloody drainage at first toe, denies calf pain, denies headache, chest pain, shortness of breath, nausea, vomiting, fever, or chills.  Patient is using Betadine and Steri-Strips as instructed last visit. No other issues noted.   Fasting blood sugar this morning 218.  Patient Active Problem List   Diagnosis Date Noted  . GERD (gastroesophageal reflux disease) 12/27/2015  . Arthritis 11/16/2015  . Diabetes mellitus (HCC) 11/16/2015  . Hyperlipidemia 11/16/2015  . Localized edema 11/16/2015  . OBESITY 05/15/2007  . ADD 05/15/2007  . HYPERTENSION 05/15/2007  . Allergic rhinitis 05/15/2007  . SLEEP APNEA 05/15/2007  . COUGH 05/15/2007    Current Outpatient Medications on File Prior to Visit  Medication Sig Dispense Refill  . aspirin EC 81 MG tablet Take 81 mg by mouth daily.    . Cetirizine HCl (ZYRTEC ALLERGY PO) Take 10 mg by mouth daily.    Marland Kitchen glimepiride (AMARYL) 4 MG tablet Take 4 mg by mouth daily with breakfast.     . insulin NPH-regular Human (NOVOLIN 70/30) (70-30) 100 UNIT/ML injection Inject 40 Units into the skin 2 (two) times daily with a meal.    . metFORMIN (GLUCOPHAGE-XR) 500 MG 24 hr tablet Take 1,000 mg by mouth 2 (two) times daily.     . montelukast (SINGULAIR) 10 MG tablet TAKE 1 TABLET BY MOUTH AT BEDTIME 30 tablet 3  . pantoprazole (PROTONIX) 40 MG tablet TAKE 1 TABLET BY MOUTH TWICE DAILY 60 tablet 3  . ranitidine (ZANTAC) 150 MG tablet Take 1 tablet (150 mg total) by mouth 2 (two) times daily. 60 tablet 2  . silver nitrate applicators 75-25 % applicator Apply topically 3 (three) times a week. 100 each 0  . simvastatin (ZOCOR) 40 MG tablet TAKE 1 TABLET BY MOUTH ONCE DAILY IN THE EVENING FOR 30 DAYS  11  . Spacer/Aero-Holding Chambers  (AEROCHAMBER MV) inhaler Use as instructed 1 each 0  . sulfamethoxazole-trimethoprim (BACTRIM DS,SEPTRA DS) 800-160 MG tablet Take 1 tablet by mouth 2 (two) times daily. 28 tablet 0  . terbinafine (LAMISIL) 250 MG tablet Take 1 tablet (250 mg total) by mouth daily. 90 tablet 0   No current facility-administered medications on file prior to visit.     No Known Allergies  Objective: There were no vitals filed for this visit.  General: No acute distress, AAOx3  Right foot: Partial thickness ulceration plantar hallux appears to be improving in size measuring today 0.1 x 0.1 cm at the proximal aspect of the wound and is healed at the distal aspect of the wound with a healthy red granular base, focal edema, no erythema, no warmth, no drainage, no other signs of infection noted, Capillary fill time <3 seconds in all digits, gross sensation present via light touch to right foot however protective sensation absent secondary to diabetes with neuropathy.  No pain with calf compression.   Assessment and Plan:  Problem List Items Addressed This Visit      Endocrine   Diabetes mellitus (HCC)    Other Visit Diagnoses    Foot ulcer, right, limited to breakdown of skin (HCC)    -  Primary   S/P foot surgery, right       Contracture of tendon sheath           -  Patient seen and evaluated -Wound check performed and mechanically debrided using a sterile chisel blade the wound to healthy bleeding margins.  Hemostasis was achieved with manual pressure and then redressed area using Steri-Strips Iodosorb and gauze secured with Coban -Advised patient to continue with dressings consisting of the same  -Continue with postop shoe or tennis shoe that does not rub or irritate toe -Continue with closely monitoring and if constitutional symptoms are present to return to office or report to emergency room -Will plan for wound check at next office visit in 2 to 3 weeks. In the meantime, patient to call office if any  issues or problems arise. [name], DPM

## 2018-04-25 ENCOUNTER — Encounter: Payer: Self-pay | Admitting: Sports Medicine

## 2018-04-25 ENCOUNTER — Other Ambulatory Visit: Payer: Self-pay

## 2018-04-25 ENCOUNTER — Ambulatory Visit: Payer: BLUE CROSS/BLUE SHIELD | Admitting: Sports Medicine

## 2018-04-25 VITALS — BP 180/99 | HR 88 | Temp 97.7°F | Resp 16

## 2018-04-25 DIAGNOSIS — Z9889 Other specified postprocedural states: Secondary | ICD-10-CM

## 2018-04-25 DIAGNOSIS — E1140 Type 2 diabetes mellitus with diabetic neuropathy, unspecified: Secondary | ICD-10-CM

## 2018-04-25 DIAGNOSIS — M6240 Contracture of muscle, unspecified site: Secondary | ICD-10-CM

## 2018-04-25 DIAGNOSIS — L97511 Non-pressure chronic ulcer of other part of right foot limited to breakdown of skin: Secondary | ICD-10-CM

## 2018-04-25 NOTE — Progress Notes (Signed)
Subjective: [name] is a 58 y.o. male patient seen today in office for POV #6 (DOS 02/14/2018), S/P right hallux flexor tenotomy.  Patient denies pain at surgical site but states that he feels like the first toe wound is getting worse and states that he has pain using Betadine and Steri-Strips dressing but has noticed more yellow drainage coming from the wound denies any redness or significant swelling to the toe denies calf pain, denies headache, chest pain, shortness of breath, nausea, vomiting, fever, or chills. No other issues noted.   Fasting blood sugar this morning 155.  Patient Active Problem List   Diagnosis Date Noted  . GERD (gastroesophageal reflux disease) 12/27/2015  . Arthritis 11/16/2015  . Diabetes mellitus (HCC) 11/16/2015  . Hyperlipidemia 11/16/2015  . Localized edema 11/16/2015  . OBESITY 05/15/2007  . ADD 05/15/2007  . HYPERTENSION 05/15/2007  . Allergic rhinitis 05/15/2007  . SLEEP APNEA 05/15/2007  . COUGH 05/15/2007    Current Outpatient Medications on File Prior to Visit  Medication Sig Dispense Refill  . amLODipine (NORVASC) 5 MG tablet Take 5 mg by mouth daily.    Marland Kitchen aspirin EC 81 MG tablet Take 81 mg by mouth daily.    . Cetirizine HCl (ZYRTEC ALLERGY PO) Take 10 mg by mouth daily.    Marland Kitchen glimepiride (AMARYL) 4 MG tablet Take 4 mg by mouth daily with breakfast.     . insulin NPH-regular Human (NOVOLIN 70/30) (70-30) 100 UNIT/ML injection Inject 40 Units into the skin 2 (two) times daily with a meal.    . metFORMIN (GLUCOPHAGE-XR) 500 MG 24 hr tablet Take 1,000 mg by mouth 2 (two) times daily.     . montelukast (SINGULAIR) 10 MG tablet TAKE 1 TABLET BY MOUTH AT BEDTIME 30 tablet 3  . pantoprazole (PROTONIX) 40 MG tablet TAKE 1 TABLET BY MOUTH TWICE DAILY 60 tablet 3  . ranitidine (ZANTAC) 150 MG tablet Take 1 tablet (150 mg total) by mouth 2 (two) times daily. 60 tablet 2  . silver nitrate applicators 75-25 % applicator Apply topically 3 (three) times a  week. 100 each 0  . simvastatin (ZOCOR) 40 MG tablet TAKE 1 TABLET BY MOUTH ONCE DAILY IN THE EVENING FOR 30 DAYS  11  . Spacer/Aero-Holding Chambers (AEROCHAMBER MV) inhaler Use as instructed 1 each 0  . sulfamethoxazole-trimethoprim (BACTRIM DS,SEPTRA DS) 800-160 MG tablet Take 1 tablet by mouth 2 (two) times daily. 28 tablet 0  . terbinafine (LAMISIL) 250 MG tablet Take 1 tablet (250 mg total) by mouth daily. 90 tablet 0   No current facility-administered medications on file prior to visit.     No Known Allergies  Objective: There were no vitals filed for this visit.  General: No acute distress, AAOx3  Right foot: Partial thickness ulceration plantar hallux appears to be increasing in size today measuring 0.5 x 0.6 cm at the plantar proximal aspect of the right great toe, focal edema, no erythema, no warmth, no drainage, no other signs of infection noted, Capillary fill time <3 seconds in all digits, gross sensation present via light touch to right foot however protective sensation absent secondary to diabetes with neuropathy.  No pain with calf compression.   Assessment and Plan:  Problem List Items Addressed This Visit      Endocrine   Diabetes mellitus (HCC)    Other Visit Diagnoses    Foot ulcer, right, limited to breakdown of skin (HCC)    -  Primary   S/P foot  surgery, right       Contracture of tendon sheath           -Patient seen and evaluated -Wound was debrided using a sterile chisel blade the wound to healthy bleeding margins.  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 In the meantime, patient to call office if any issues or problems arise. [name], DPM

## 2018-05-02 DIAGNOSIS — E119 Type 2 diabetes mellitus without complications: Secondary | ICD-10-CM | POA: Diagnosis not present

## 2018-05-02 DIAGNOSIS — J4520 Mild intermittent asthma, uncomplicated: Secondary | ICD-10-CM | POA: Diagnosis not present

## 2018-05-02 DIAGNOSIS — G4733 Obstructive sleep apnea (adult) (pediatric): Secondary | ICD-10-CM | POA: Diagnosis not present

## 2018-05-02 DIAGNOSIS — K222 Esophageal obstruction: Secondary | ICD-10-CM | POA: Diagnosis not present

## 2018-05-07 DIAGNOSIS — H3561 Retinal hemorrhage, right eye: Secondary | ICD-10-CM | POA: Diagnosis not present

## 2018-05-07 DIAGNOSIS — E113411 Type 2 diabetes mellitus with severe nonproliferative diabetic retinopathy with macular edema, right eye: Secondary | ICD-10-CM | POA: Diagnosis not present

## 2018-05-07 DIAGNOSIS — E113412 Type 2 diabetes mellitus with severe nonproliferative diabetic retinopathy with macular edema, left eye: Secondary | ICD-10-CM | POA: Diagnosis not present

## 2018-05-07 DIAGNOSIS — H2511 Age-related nuclear cataract, right eye: Secondary | ICD-10-CM | POA: Diagnosis not present

## 2018-05-09 ENCOUNTER — Other Ambulatory Visit: Payer: Self-pay

## 2018-05-09 ENCOUNTER — Encounter: Payer: Self-pay | Admitting: Sports Medicine

## 2018-05-09 ENCOUNTER — Ambulatory Visit: Payer: BLUE CROSS/BLUE SHIELD | Admitting: Sports Medicine

## 2018-05-09 DIAGNOSIS — E1140 Type 2 diabetes mellitus with diabetic neuropathy, unspecified: Secondary | ICD-10-CM

## 2018-05-09 DIAGNOSIS — L97511 Non-pressure chronic ulcer of other part of right foot limited to breakdown of skin: Secondary | ICD-10-CM | POA: Diagnosis not present

## 2018-05-09 DIAGNOSIS — M6240 Contracture of muscle, unspecified site: Secondary | ICD-10-CM

## 2018-05-09 DIAGNOSIS — Z9889 Other specified postprocedural states: Secondary | ICD-10-CM

## 2018-05-09 NOTE — Progress Notes (Signed)
Subjective: GLOVER DIBERT is a 58 y.o. male patient seen today in office for POV #7 (DOS 02/14/2018), S/P right hallux flexor tenotomy.  Patient reports that his wound is getting worse deeper than before and that his right great toenail is coming off. Patient denies calf pain, denies headache, chest pain, shortness of breath, nausea, vomiting, fever, or chills. Reports that he has been laid off from work. No other issues noted.   Fasting blood sugar this morning no recorded.   Patient Active Problem List   Diagnosis Date Noted  . GERD (gastroesophageal reflux disease) 12/27/2015  . Arthritis 11/16/2015  . Diabetes mellitus (HCC) 11/16/2015  . Hyperlipidemia 11/16/2015  . Localized edema 11/16/2015  . OBESITY 05/15/2007  . ADD 05/15/2007  . HYPERTENSION 05/15/2007  . Allergic rhinitis 05/15/2007  . SLEEP APNEA 05/15/2007  . COUGH 05/15/2007    Current Outpatient Medications on File Prior to Visit  Medication Sig Dispense Refill  . amLODipine (NORVASC) 5 MG tablet Take 5 mg by mouth daily.    Marland Kitchen aspirin EC 81 MG tablet Take 81 mg by mouth daily.    . Cetirizine HCl (ZYRTEC ALLERGY PO) Take 10 mg by mouth daily.    Marland Kitchen glimepiride (AMARYL) 4 MG tablet Take 4 mg by mouth daily with breakfast.     . insulin NPH-regular Human (NOVOLIN 70/30) (70-30) 100 UNIT/ML injection Inject 40 Units into the skin 2 (two) times daily with a meal.    . metFORMIN (GLUCOPHAGE-XR) 500 MG 24 hr tablet Take 1,000 mg by mouth 2 (two) times daily.     . montelukast (SINGULAIR) 10 MG tablet TAKE 1 TABLET BY MOUTH AT BEDTIME 30 tablet 3  . pantoprazole (PROTONIX) 40 MG tablet TAKE 1 TABLET BY MOUTH TWICE DAILY 60 tablet 3  . ranitidine (ZANTAC) 150 MG tablet Take 1 tablet (150 mg total) by mouth 2 (two) times daily. 60 tablet 2  . silver nitrate applicators 75-25 % applicator Apply topically 3 (three) times a week. 100 each 0  . simvastatin (ZOCOR) 40 MG tablet TAKE 1 TABLET BY MOUTH ONCE DAILY IN THE EVENING FOR 30  DAYS  11  . Spacer/Aero-Holding Chambers (AEROCHAMBER MV) inhaler Use as instructed 1 each 0  . sulfamethoxazole-trimethoprim (BACTRIM DS,SEPTRA DS) 800-160 MG tablet Take 1 tablet by mouth 2 (two) times daily. 28 tablet 0  . terbinafine (LAMISIL) 250 MG tablet Take 1 tablet (250 mg total) by mouth daily. 90 tablet 0   No current facility-administered medications on file prior to visit.     No Known Allergies  Objective: There were no vitals filed for this visit.  General: No acute distress, AAOx3  Right foot: Partial thickness ulceration plantar hallux appears to be increasing in size today measuring 1 x 0.5 cm at the plantar proximal aspect of the right great toe, focal edema, no erythema, no warmth, no drainage, no other signs of infection noted, to the right second toe there is a pre-ulcerative callus, to right great toe nail nail is partially attached with no surrounding signs of infection, capillary fill time <3 seconds in all digits, gross sensation present via light touch to right foot however protective sensation absent secondary to diabetes with neuropathy.  No pain with calf compression.   Assessment and Plan:  Problem List Items Addressed This Visit      Endocrine   Diabetes mellitus (HCC)    Other Visit Diagnoses    Foot ulcer, right, limited to breakdown of skin (HCC)    -  Primary   S/P foot surgery, right       Contracture of tendon sheath           -Patient seen and evaluated -Wound was debrided using a sterile chisel blade the wound to healthy bleeding margins.  Hemostasis was achieved with manual pressure and then redressed area using antibiotic cream offloading padding and short leg fiberglass total contact cast size 4 inch with postop walking shoe applied.  Patient tolerated cast application well with bony areas well-padded with no issues.  Advised patient to keep cast clean dry and intact and will reevaluate patient on next week for reapplication with walking  boot -Continue with closely monitoring and if constitutional symptoms are present to return to office or report to emergency room -Will plan for cast change and wound check at next office visit in 1 week. In the meantime, patient to call office if any issues or problems arise. [name], DPM

## 2018-05-15 ENCOUNTER — Ambulatory Visit: Payer: BLUE CROSS/BLUE SHIELD | Admitting: Sports Medicine

## 2018-05-16 ENCOUNTER — Other Ambulatory Visit: Payer: Self-pay

## 2018-05-16 ENCOUNTER — Encounter: Payer: Self-pay | Admitting: Sports Medicine

## 2018-05-16 ENCOUNTER — Ambulatory Visit: Payer: BLUE CROSS/BLUE SHIELD | Admitting: Sports Medicine

## 2018-05-16 DIAGNOSIS — Z9889 Other specified postprocedural states: Secondary | ICD-10-CM

## 2018-05-16 DIAGNOSIS — E1140 Type 2 diabetes mellitus with diabetic neuropathy, unspecified: Secondary | ICD-10-CM

## 2018-05-16 DIAGNOSIS — M6240 Contracture of muscle, unspecified site: Secondary | ICD-10-CM

## 2018-05-16 DIAGNOSIS — L97511 Non-pressure chronic ulcer of other part of right foot limited to breakdown of skin: Secondary | ICD-10-CM

## 2018-05-16 NOTE — Progress Notes (Signed)
Subjective: [name] is a 58 y.o. male patient seen today in office for POV #8 (DOS 02/14/2018), S/P right hallux flexor tenotomy.  Patient reports that he was able to keep the cast, clean, dry, and intact. Patient denies calf pain, denies headache, chest pain, shortness of breath, nausea, vomiting, fever, or chills. Reports that he has been laid off from work. No other issues noted.   Fasting blood sugar this morning was 166 and is doing better since he has been at home and is out of work.   Patient Active Problem List   Diagnosis Date Noted  . GERD (gastroesophageal reflux disease) 12/27/2015  . Arthritis 11/16/2015  . Diabetes mellitus (HCC) 11/16/2015  . Hyperlipidemia 11/16/2015  . Localized edema 11/16/2015  . OBESITY 05/15/2007  . ADD 05/15/2007  . HYPERTENSION 05/15/2007  . Allergic rhinitis 05/15/2007  . SLEEP APNEA 05/15/2007  . COUGH 05/15/2007    Current Outpatient Medications on File Prior to Visit  Medication Sig Dispense Refill  . amLODipine (NORVASC) 5 MG tablet Take 5 mg by mouth daily.    Marland Kitchen aspirin EC 81 MG tablet Take 81 mg by mouth daily.    . Cetirizine HCl (ZYRTEC ALLERGY PO) Take 10 mg by mouth daily.    Marland Kitchen glimepiride (AMARYL) 4 MG tablet Take 4 mg by mouth daily with breakfast.     . insulin NPH-regular Human (NOVOLIN 70/30) (70-30) 100 UNIT/ML injection Inject 40 Units into the skin 2 (two) times daily with a meal.    . metFORMIN (GLUCOPHAGE-XR) 500 MG 24 hr tablet Take 1,000 mg by mouth 2 (two) times daily.     . montelukast (SINGULAIR) 10 MG tablet TAKE 1 TABLET BY MOUTH AT BEDTIME 30 tablet 3  . pantoprazole (PROTONIX) 40 MG tablet TAKE 1 TABLET BY MOUTH TWICE DAILY 60 tablet 3  . ranitidine (ZANTAC) 150 MG tablet Take 1 tablet (150 mg total) by mouth 2 (two) times daily. 60 tablet 2  . silver nitrate applicators 75-25 % applicator Apply topically 3 (three) times a week. 100 each 0  . simvastatin (ZOCOR) 40 MG tablet TAKE 1 TABLET BY MOUTH ONCE DAILY  IN THE EVENING FOR 30 DAYS  11  . Spacer/Aero-Holding Chambers (AEROCHAMBER MV) inhaler Use as instructed 1 each 0  . sulfamethoxazole-trimethoprim (BACTRIM DS,SEPTRA DS) 800-160 MG tablet Take 1 tablet by mouth 2 (two) times daily. 28 tablet 0  . terbinafine (LAMISIL) 250 MG tablet Take 1 tablet (250 mg total) by mouth daily. 90 tablet 0   No current facility-administered medications on file prior to visit.     No Known Allergies  Objective: There were no vitals filed for this visit.  General: No acute distress, AAOx3  Right foot: Cast was removed. There is a partial thickness ulceration plantar hallux appears to be now closed over after being if TTC for 1 week, to the right second toe there is a pre-ulcerative callus that appears to be much improved from prior, capillary fill time <3 seconds in all digits, gross sensation present via light touch to right foot however protective sensation absent secondary to diabetes with neuropathy.  No pain with calf compression.   Assessment and Plan:  Problem List Items Addressed This Visit      Endocrine   Diabetes mellitus (HCC)    Other Visit Diagnoses    Foot ulcer, right, limited to breakdown of skin (HCC)    -  Primary   S/P foot surgery, right  Contracture of tendon sheath           -Patient seen and evaluated -Wound was debrided using a sterile chisel blade the wound to healthy bleeding margins.  Hemostasis was achieved with manual pressure and then redressed area using antibiotic cream offloading padding and short leg fiberglass total contact cast size 4 inch with walking boot applied.  Patient tolerated cast application well with bony areas well-padded with no issues.  Advised patient to keep cast clean dry and intact and will reevaluate patient on next week for reapplication with walking boot -Continue with monitoring and if constitutional symptoms are present to return to office or report to emergency room -Will plan for cast  change (DOWNSIZE TO SIZE 3) and wound check at next office visit in 1 week. In the meantime, patient to call office if any issues or problems arise. [name], DPM

## 2018-05-22 ENCOUNTER — Ambulatory Visit (INDEPENDENT_AMBULATORY_CARE_PROVIDER_SITE_OTHER): Payer: BLUE CROSS/BLUE SHIELD | Admitting: Sports Medicine

## 2018-05-22 ENCOUNTER — Encounter: Payer: Self-pay | Admitting: Sports Medicine

## 2018-05-22 ENCOUNTER — Other Ambulatory Visit: Payer: Self-pay

## 2018-05-22 ENCOUNTER — Ambulatory Visit: Payer: BLUE CROSS/BLUE SHIELD | Admitting: Sports Medicine

## 2018-05-22 VITALS — BP 180/99 | HR 92 | Temp 97.6°F | Resp 16

## 2018-05-22 DIAGNOSIS — E1140 Type 2 diabetes mellitus with diabetic neuropathy, unspecified: Secondary | ICD-10-CM

## 2018-05-22 DIAGNOSIS — L97511 Non-pressure chronic ulcer of other part of right foot limited to breakdown of skin: Secondary | ICD-10-CM | POA: Diagnosis not present

## 2018-05-22 DIAGNOSIS — M6240 Contracture of muscle, unspecified site: Secondary | ICD-10-CM

## 2018-05-22 DIAGNOSIS — Z9889 Other specified postprocedural states: Secondary | ICD-10-CM

## 2018-05-22 NOTE — Progress Notes (Signed)
Patient returned to office for assessment of TTC. Patient reports that cast put on earlier this morning is rubbing his heel when he is in boot. The cast was removed and the heel was checked with no break in the skin and then re-applied. Patient to return to office in 1 week for cast removal. -Dr. Marylene Land

## 2018-05-22 NOTE — Progress Notes (Signed)
Subjective: [name] is a 58 y.o. male patient seen today in office for POV #9 (DOS 02/14/2018), S/P right hallux flexor tenotomy.  Patient reports that he was able to keep the cast, clean, dry, and intact. Patient denies calf pain, denies headache, chest pain, shortness of breath, nausea, vomiting, fever, or chills. Reports that he has been laid off from work. No other issues noted.   Fasting blood sugar this morning was 119.   Patient Active Problem List   Diagnosis Date Noted  . GERD (gastroesophageal reflux disease) 12/27/2015  . Arthritis 11/16/2015  . Diabetes mellitus (HCC) 11/16/2015  . Hyperlipidemia 11/16/2015  . Localized edema 11/16/2015  . OBESITY 05/15/2007  . ADD 05/15/2007  . HYPERTENSION 05/15/2007  . Allergic rhinitis 05/15/2007  . SLEEP APNEA 05/15/2007  . COUGH 05/15/2007    Current Outpatient Medications on File Prior to Visit  Medication Sig Dispense Refill  . amLODipine (NORVASC) 5 MG tablet Take 5 mg by mouth daily.    Marland Kitchen aspirin EC 81 MG tablet Take 81 mg by mouth daily.    . Cetirizine HCl (ZYRTEC ALLERGY PO) Take 10 mg by mouth daily.    Marland Kitchen glimepiride (AMARYL) 4 MG tablet Take 4 mg by mouth daily with breakfast.     . insulin NPH-regular Human (NOVOLIN 70/30) (70-30) 100 UNIT/ML injection Inject 40 Units into the skin 2 (two) times daily with a meal.    . metFORMIN (GLUCOPHAGE-XR) 500 MG 24 hr tablet Take 1,000 mg by mouth 2 (two) times daily.     . montelukast (SINGULAIR) 10 MG tablet TAKE 1 TABLET BY MOUTH AT BEDTIME 30 tablet 3  . pantoprazole (PROTONIX) 40 MG tablet TAKE 1 TABLET BY MOUTH TWICE DAILY 60 tablet 3  . ranitidine (ZANTAC) 150 MG tablet Take 1 tablet (150 mg total) by mouth 2 (two) times daily. 60 tablet 2  . silver nitrate applicators 75-25 % applicator Apply topically 3 (three) times a week. 100 each 0  . simvastatin (ZOCOR) 40 MG tablet TAKE 1 TABLET BY MOUTH ONCE DAILY IN THE EVENING FOR 30 DAYS  11  . Spacer/Aero-Holding Chambers  (AEROCHAMBER MV) inhaler Use as instructed 1 each 0  . sulfamethoxazole-trimethoprim (BACTRIM DS,SEPTRA DS) 800-160 MG tablet Take 1 tablet by mouth 2 (two) times daily. 28 tablet 0  . terbinafine (LAMISIL) 250 MG tablet Take 1 tablet (250 mg total) by mouth daily. 90 tablet 0   No current facility-administered medications on file prior to visit.     No Known Allergies  Objective: There were no vitals filed for this visit.  General: No acute distress, AAOx3  Right foot: Cast was removed. There is a partial thickness ulceration plantar hallux appears to be now closed over after being if TTC for 2 weeks now, to the right second toe there is a pre-ulcerative callus that appears to be much improved from prior like before with no opening, capillary fill time <3 seconds in all digits, gross sensation present via light touch to right foot however protective sensation absent secondary to diabetes with neuropathy. +Right hallux rigidus, Hammertoe, and pes planus deformity No pain with palpation to foot or calf compression.   Assessment and Plan:  Problem List Items Addressed This Visit      Endocrine   Diabetes mellitus (HCC)    Other Visit Diagnoses    Foot ulcer, right, limited to breakdown of skin (HCC)    -  Primary   R plantar hallux prematurely healed  S/P foot surgery, right       Contracture of tendon sheath          -Patient seen and evaluated -Wound and pre-ulcerative area was padded with offloading padding and short leg fiberglass total contact cast size 3 inch with walking boot applied.  This is cast #3. Patient tolerated cast application well with bony areas well-padded with no issues.  Advised patient to keep cast clean dry and intact and will reevaluate patient on next week for reapplication with walking boot -Continue with monitoring and if constitutional symptoms are present to return to office or report to emergency room -Will plan for cast removal and wound check at next  office visit in 1 week. In the meantime, patient to call office if any issues or problems arise. Patient at next visit will use post op shoe after cast is removed and then the following week will see rick for diabetic insoles and shoes if covered. Safe step paperwork completed.   Asencion Islamitorya Lasundra Hascall, DPM

## 2018-05-30 ENCOUNTER — Other Ambulatory Visit: Payer: Self-pay

## 2018-05-30 ENCOUNTER — Ambulatory Visit: Payer: BLUE CROSS/BLUE SHIELD | Admitting: Sports Medicine

## 2018-05-30 ENCOUNTER — Encounter: Payer: Self-pay | Admitting: Sports Medicine

## 2018-05-30 VITALS — Temp 98.3°F | Resp 16

## 2018-05-30 DIAGNOSIS — M6240 Contracture of muscle, unspecified site: Secondary | ICD-10-CM

## 2018-05-30 DIAGNOSIS — E1140 Type 2 diabetes mellitus with diabetic neuropathy, unspecified: Secondary | ICD-10-CM

## 2018-05-30 DIAGNOSIS — M2042 Other hammer toe(s) (acquired), left foot: Secondary | ICD-10-CM

## 2018-05-30 DIAGNOSIS — M2041 Other hammer toe(s) (acquired), right foot: Secondary | ICD-10-CM | POA: Diagnosis not present

## 2018-05-30 DIAGNOSIS — L97511 Non-pressure chronic ulcer of other part of right foot limited to breakdown of skin: Secondary | ICD-10-CM | POA: Diagnosis not present

## 2018-05-30 DIAGNOSIS — Z9889 Other specified postprocedural states: Secondary | ICD-10-CM

## 2018-05-30 DIAGNOSIS — M2020 Hallux rigidus, unspecified foot: Secondary | ICD-10-CM

## 2018-05-30 NOTE — Addendum Note (Signed)
Addended by: Renaldo Reel A on: 05/30/2018 12:46 PM   Modules accepted: Orders

## 2018-05-30 NOTE — Progress Notes (Signed)
Subjective: [name] is a 58 y.o. male patient seen today in office for POV #10 (DOS 02/14/2018), S/P right hallux flexor tenotomy.  Patient reports that he was able to keep the cast, clean, dry, and intact but it felt softer than the size 4 cast. Patient denies calf pain, denies headache, chest pain, shortness of breath, nausea, vomiting, fever, or chills. No other issues noted.   Fasting blood sugar this morning was 115, A1c 7.9.   Patient Active Problem List   Diagnosis Date Noted  . Laryngopharyngeal reflux (LPR) 02/07/2016  . GERD (gastroesophageal reflux disease) 12/27/2015  . Arthritis 11/16/2015  . Diabetes mellitus (HCC) 11/16/2015  . Hyperlipidemia 11/16/2015  . Localized edema 11/16/2015  . OBESITY 05/15/2007  . ADD 05/15/2007  . HYPERTENSION 05/15/2007  . Allergic rhinitis 05/15/2007  . SLEEP APNEA 05/15/2007  . COUGH 05/15/2007    Current Outpatient Medications on File Prior to Visit  Medication Sig Dispense Refill  . amLODipine (NORVASC) 5 MG tablet Take 5 mg by mouth daily.    Marland Kitchen. aspirin EC 81 MG tablet Take 81 mg by mouth daily.    . BD INSULIN SYRINGE U/F 31G X 5/16" 1 ML MISC See admin instructions.    . Cetirizine HCl (ZYRTEC ALLERGY PO) Take 10 mg by mouth daily.    Marland Kitchen. glimepiride (AMARYL) 4 MG tablet Take 4 mg by mouth daily with breakfast.     . insulin NPH-regular Human (NOVOLIN 70/30) (70-30) 100 UNIT/ML injection Inject 40 Units into the skin 2 (two) times daily with a meal.    . metFORMIN (GLUCOPHAGE-XR) 500 MG 24 hr tablet Take 1,000 mg by mouth 2 (two) times daily.     . montelukast (SINGULAIR) 10 MG tablet TAKE 1 TABLET BY MOUTH AT BEDTIME 30 tablet 3  . pantoprazole (PROTONIX) 40 MG tablet TAKE 1 TABLET BY MOUTH TWICE DAILY 60 tablet 3  . ranitidine (ZANTAC) 150 MG tablet Take 1 tablet (150 mg total) by mouth 2 (two) times daily. 60 tablet 2  . silver nitrate applicators 75-25 % applicator Apply topically 3 (three) times a week. 100 each 0  .  simvastatin (ZOCOR) 40 MG tablet TAKE 1 TABLET BY MOUTH ONCE DAILY IN THE EVENING FOR 30 DAYS  11  . Spacer/Aero-Holding Chambers (AEROCHAMBER MV) inhaler Use as instructed 1 each 0  . sulfamethoxazole-trimethoprim (BACTRIM DS,SEPTRA DS) 800-160 MG tablet Take 1 tablet by mouth 2 (two) times daily. 28 tablet 0  . terbinafine (LAMISIL) 250 MG tablet Take 1 tablet (250 mg total) by mouth daily. 90 tablet 0   No current facility-administered medications on file prior to visit.     No Known Allergies  Objective: There were no vitals filed for this visit.  General: No acute distress, AAOx3  Right foot: Cast was removed. There is a healed ulceration to right 1st toe and to the right second toe there is a pre-ulcerative callus that appears to be much improved from prior like before with no opening, capillary fill time <3 seconds in all digits, gross sensation present via light touch to right foot however protective sensation absent secondary to diabetes with neuropathy. +Right hallux rigidus, Hammertoe, and pes planus deformity No pain with palpation to foot or calf compression.   Assessment and Plan:  Problem List Items Addressed This Visit      Endocrine   Diabetes mellitus (HCC)    Other Visit Diagnoses    Foot ulcer, right, limited to breakdown of skin (HCC)    -  Primary   S/P foot surgery, right       Contracture of tendon sheath       Hammertoe of right foot       Hallux rigidus, unspecified laterality          -Patient seen and evaluated -Wound and pre-ulcerative ares are healed -Patient may now use vasaline after shower to the healed/dry skin areas -Recommend use Post op shoe  -Patient was casted at today's visit for diabetic insoles to offload right 1st toe and 2nd toes bilateral  -Return to pick up orthotics or sooner if problems or issues arise  [name], DPM

## 2018-06-09 DIAGNOSIS — L089 Local infection of the skin and subcutaneous tissue, unspecified: Secondary | ICD-10-CM | POA: Diagnosis not present

## 2018-06-09 DIAGNOSIS — J4520 Mild intermittent asthma, uncomplicated: Secondary | ICD-10-CM | POA: Diagnosis not present

## 2018-06-09 DIAGNOSIS — G4733 Obstructive sleep apnea (adult) (pediatric): Secondary | ICD-10-CM | POA: Diagnosis not present

## 2018-06-11 ENCOUNTER — Telehealth: Payer: Self-pay | Admitting: *Deleted

## 2018-06-11 ENCOUNTER — Other Ambulatory Visit: Payer: Self-pay

## 2018-06-11 ENCOUNTER — Ambulatory Visit: Payer: BLUE CROSS/BLUE SHIELD | Admitting: Sports Medicine

## 2018-06-11 ENCOUNTER — Ambulatory Visit (INDEPENDENT_AMBULATORY_CARE_PROVIDER_SITE_OTHER): Payer: BLUE CROSS/BLUE SHIELD

## 2018-06-11 ENCOUNTER — Other Ambulatory Visit: Payer: Self-pay | Admitting: Sports Medicine

## 2018-06-11 ENCOUNTER — Encounter: Payer: Self-pay | Admitting: Sports Medicine

## 2018-06-11 DIAGNOSIS — L97511 Non-pressure chronic ulcer of other part of right foot limited to breakdown of skin: Secondary | ICD-10-CM | POA: Diagnosis not present

## 2018-06-11 DIAGNOSIS — M2041 Other hammer toe(s) (acquired), right foot: Secondary | ICD-10-CM

## 2018-06-11 DIAGNOSIS — E1140 Type 2 diabetes mellitus with diabetic neuropathy, unspecified: Secondary | ICD-10-CM

## 2018-06-11 DIAGNOSIS — M216X1 Other acquired deformities of right foot: Secondary | ICD-10-CM

## 2018-06-11 DIAGNOSIS — Z9889 Other specified postprocedural states: Secondary | ICD-10-CM

## 2018-06-11 DIAGNOSIS — M6240 Contracture of muscle, unspecified site: Secondary | ICD-10-CM

## 2018-06-11 NOTE — Addendum Note (Signed)
Addended by: Renaldo Reel A on: 06/11/2018 02:32 PM   Modules accepted: Orders

## 2018-06-11 NOTE — Telephone Encounter (Addendum)
"  Dr. Marylene Land told me to give you a call to schedule my surgery.  Do you have my information?"  I do not but I can go ahead and schedule a date for you.  She does surgeries on Mondays.  Do you have a date in mind?  "I'd like to do it this coming Monday."  I don't think this Monday is available.  The surgical center is only allowing a limited schedule.  She can do it on Jun 23, 2018.  If the scheduler tells me time is available for next week, I'll call and let you know.  "She also told me to ask you about get information of how much I'll have to pay.  I think she said I will have a facility fee and an anesthesia fee."  Yes, that is correct.  You will have three fees.  I'll get Chip Boer in our insurance department to give you a call.  She'll provide you with an estimate.  You'll need to call the surgical center to get their estimate as well as anesthesia fee.  Your procedure will take about 30 to 45 minutes.  "Okay, thank you so much."   Chip Boer - [phone]

## 2018-06-11 NOTE — Progress Notes (Signed)
Subjective: [name] is a 58 y.o. male patient seen today in office for POV #11 (DOS 02/14/2018), S/P right hallux flexor tenotomy performed in office.  Patient reports on Friday he noticed a small amount of blood to the right second toe and reports that the area started to darken and he is concerned that the ulcerations are coming back and getting worse.  Patient denies nausea vomiting fever chills or any other constitutional symptoms at this time.  Patient denies calf pain, denies headache, chest pain, shortness of breath, nausea, vomiting, fever, or chills. No other issues noted.   Fasting blood sugar this morning was 179, A1c 7.9.   Patient Active Problem List   Diagnosis Date Noted  . Laryngopharyngeal reflux (LPR) 02/07/2016  . GERD (gastroesophageal reflux disease) 12/27/2015  . Arthritis 11/16/2015  . Diabetes mellitus (HCC) 11/16/2015  . Hyperlipidemia 11/16/2015  . Localized edema 11/16/2015  . OBESITY 05/15/2007  . ADD 05/15/2007  . HYPERTENSION 05/15/2007  . Allergic rhinitis 05/15/2007  . SLEEP APNEA 05/15/2007  . COUGH 05/15/2007    Current Outpatient Medications on File Prior to Visit  Medication Sig Dispense Refill  . amLODipine (NORVASC) 5 MG tablet Take 5 mg by mouth daily.    Marland Kitchen aspirin EC 81 MG tablet Take 81 mg by mouth daily.    . BD INSULIN SYRINGE U/F 31G X 5/16" 1 ML MISC See admin instructions.    . Cetirizine HCl (ZYRTEC ALLERGY PO) Take 10 mg by mouth daily.    Marland Kitchen glimepiride (AMARYL) 4 MG tablet Take 4 mg by mouth daily with breakfast.     . insulin NPH-regular Human (NOVOLIN 70/30) (70-30) 100 UNIT/ML injection Inject 40 Units into the skin 2 (two) times daily with a meal.    . metFORMIN (GLUCOPHAGE-XR) 500 MG 24 hr tablet Take 1,000 mg by mouth 2 (two) times daily.     . montelukast (SINGULAIR) 10 MG tablet TAKE 1 TABLET BY MOUTH AT BEDTIME 30 tablet 3  . pantoprazole (PROTONIX) 40 MG tablet TAKE 1 TABLET BY MOUTH TWICE DAILY 60 tablet 3  . ranitidine  (ZANTAC) 150 MG tablet Take 1 tablet (150 mg total) by mouth 2 (two) times daily. 60 tablet 2  . silver nitrate applicators 75-25 % applicator Apply topically 3 (three) times a week. 100 each 0  . simvastatin (ZOCOR) 40 MG tablet TAKE 1 TABLET BY MOUTH ONCE DAILY IN THE EVENING FOR 30 DAYS  11  . Spacer/Aero-Holding Chambers (AEROCHAMBER MV) inhaler Use as instructed 1 each 0  . sulfamethoxazole-trimethoprim (BACTRIM DS,SEPTRA DS) 800-160 MG tablet Take 1 tablet by mouth 2 (two) times daily. 28 tablet 0  . terbinafine (LAMISIL) 250 MG tablet Take 1 tablet (250 mg total) by mouth daily. 90 tablet 0   No current facility-administered medications on file prior to visit.     No Known Allergies  Objective: There were no vitals filed for this visit.  General: No acute distress, AAOx3  Right foot: There is a pinpoint ulceration measures less than 0.5 cm at the right second toe and at the right first toe distal tuft that appears to be reopened, capillary fill time <3 seconds in all digits, gross sensation present via light touch to right foot however protective sensation absent secondary to diabetes with neuropathy. +Right hallux rigidus, Hammertoe, and pes planus deformity, limited ankle range of motion previous history of an Achilles rupture that secondarily healing with contracture, no pain with palpation to foot or calf compression.  X-rays negative for any osseous erosions at the distal tuft however there is hammertoe deformity midtarsal breech supportive of pes planus deformity, no other acute findings.  Assessment and Plan:  Problem List Items Addressed This Visit      Endocrine   Diabetes mellitus (HCC)    Other Visit Diagnoses    Foot ulcer, right, limited to breakdown of skin (HCC)    -  Primary   Relevant Orders   DG Foot Complete Right   S/P foot surgery, right       Contracture of tendon sheath       Hammertoe of right foot       Acquired equinus deformity of right foot           -Patient seen and evaluated -X-rays reviewed -Discussed treatment options for recurrent ulceration in the setting of mechanical hammertoe deformity and equinus and diabetes -Mechanically debrided ulcerations to healthy bleeding base and hemostasis was achieved using manual pressure applied Betadine and offloading foam padding to toes and advised patient to do the same at home daily until time for surgery --Patient opt for surgical management. Consent obtained for Right 1st and 2nd hammertoe repair with kwire vs screw and TAL with splint. Pre and Post op course explained. Risks, benefits, alternatives explained. No guarantees given or implied. Surgical booking slip submitted and provided patient with Surgical packet and info for [facility]. Advised patient to speak with bill office for estimated out of pocket. -Patient will need to be NWB after surgery for at minimal 2 weeks; Knee scooter requested -Return to in 1 week or for surgery when scheduled.  Asencion Islamitorya Jaycelynn Knickerbocker, DPM

## 2018-06-11 NOTE — Addendum Note (Signed)
Addended by: Renaldo Reel A on: 06/11/2018 02:37 PM   Modules accepted: Orders

## 2018-06-11 NOTE — Patient Instructions (Signed)
Pre-Operative Instructions  Congratulations, you have decided to take an important step towards improving your quality of life.  You can be assured that the doctors and staff at [facility] will be with you every step of the way.  Here are some important things you should know:  1. Plan to be at the surgery center/hospital at least 1 (one) hour prior to your scheduled time, unless otherwise directed by the surgical center/hospital staff.  You must have a responsible adult accompany you, remain during the surgery and drive you home.  Make sure you have directions to the surgical center/hospital to ensure you arrive on time. 2. If you are having surgery at [facility] or [facility] hospitals, you will need a copy of your medical history and physical form from your family physician within one month prior to the date of surgery. We will give you a form for your primary physician to complete.  3. We make every effort to accommodate the date you request for surgery.  However, there are times where surgery dates or times have to be moved.  We will contact you as soon as possible if a change in schedule is required.   4. No aspirin/ibuprofen for one week before surgery.  If you are on aspirin, any non-steroidal anti-inflammatory medications (Mobic, Aleve, Ibuprofen) should not be taken seven (7) days prior to your surgery.  You make take Tylenol for pain prior to surgery.  5. Medications - If you are taking daily heart and blood pressure medications, seizure, reflux, allergy, asthma, anxiety, pain or diabetes medications, make sure you notify the surgery center/hospital before the day of surgery so they can tell you which medications you should take or avoid the day of surgery. 6. No food or drink after midnight the night before surgery unless directed otherwise by surgical center/hospital staff. 7. No alcoholic beverages 24-hours prior to surgery.  No smoking 24-hours prior or 24-hours after  surgery. 8. Wear loose pants or shorts. They should be loose enough to fit over bandages, boots, and casts. 9. Don't wear slip-on shoes. Sneakers are preferred. 10. Bring your boot with you to the surgery center/hospital.  Also bring crutches or a walker if your physician has prescribed it for you.  If you do not have this equipment, it will be provided for you after surgery. 11. If you have not been contacted by the surgery center/hospital by the day before your surgery, call to confirm the date and time of your surgery. 12. Leave-time from work may vary depending on the type of surgery you have.  Appropriate arrangements should be made prior to surgery with your employer. 13. Prescriptions will be provided immediately following surgery by your doctor.  Fill these as soon as possible after surgery and take the medication as directed. Pain medications will not be refilled on weekends and must be approved by the doctor. 14. Remove nail polish on the operative foot and avoid getting pedicures prior to surgery. 15. Wash the night before surgery.  The night before surgery wash the foot and leg well with water and the antibacterial soap provided. Be sure to pay special attention to beneath the toenails and in between the toes.  Wash for at least three (3) minutes. Rinse thoroughly with water and dry well with a towel.  Perform this wash unless told not to do so by your physician.  Enclosed: 1 Ice pack (please put in freezer the night before surgery)   1 Hibiclens skin cleaner     Pre-op instructions  If you have any questions regarding the instructions, please do not hesitate to call our office.  [place]: [address], [place], [place] [address] -- [phone]  [place]: [address] [place], [place] [address] -- [phone]  [place]: [address]  [place], [place] [address] -- [phone]  [place]: [address], [address], [place], [place] [address] -- [phone]  Website:  [email or web address] 

## 2018-06-13 HISTORY — PX: TOE SURGERY: SHX1073

## 2018-06-16 DIAGNOSIS — Z1211 Encounter for screening for malignant neoplasm of colon: Secondary | ICD-10-CM | POA: Diagnosis not present

## 2018-06-18 ENCOUNTER — Telehealth: Payer: Self-pay | Admitting: *Deleted

## 2018-06-18 NOTE — Telephone Encounter (Signed)
"  I'd like to know how much my professional fee is going to be for my surgery that's scheduled for Monday with Dr. Marylene Land."  I'll ask Chip Boer in our insurance department to give you a call in regards to an estimate.  "Can you tell me what the cost is going to be for a screw versus a pin?"  You need to call the surgical center to inquire about that question.  (Tendo-Achilles Lengthening and Hammer Toe Repair 1,2 right foot)

## 2018-06-19 ENCOUNTER — Telehealth: Payer: Self-pay | Admitting: *Deleted

## 2018-06-19 ENCOUNTER — Other Ambulatory Visit: Payer: Self-pay

## 2018-06-19 ENCOUNTER — Encounter: Payer: Self-pay | Admitting: Sports Medicine

## 2018-06-19 ENCOUNTER — Ambulatory Visit: Payer: BLUE CROSS/BLUE SHIELD | Admitting: Sports Medicine

## 2018-06-19 DIAGNOSIS — E1140 Type 2 diabetes mellitus with diabetic neuropathy, unspecified: Secondary | ICD-10-CM

## 2018-06-19 DIAGNOSIS — M6240 Contracture of muscle, unspecified site: Secondary | ICD-10-CM

## 2018-06-19 DIAGNOSIS — M216X1 Other acquired deformities of right foot: Secondary | ICD-10-CM

## 2018-06-19 DIAGNOSIS — M2041 Other hammer toe(s) (acquired), right foot: Secondary | ICD-10-CM

## 2018-06-19 DIAGNOSIS — Z9889 Other specified postprocedural states: Secondary | ICD-10-CM

## 2018-06-19 DIAGNOSIS — L97511 Non-pressure chronic ulcer of other part of right foot limited to breakdown of skin: Secondary | ICD-10-CM

## 2018-06-19 NOTE — Telephone Encounter (Signed)
DOS 06/23/2018 CPT CODES - 76160 AND 73710 X 2 TENDO-ACHILLES LENGTHENING AND HAMMER TOE REPAIR 1,2 RIGHT FOOT  [employer] - Policy Effective : 02/12/2018 - 02/11/9998   Member Liability Summary      In-Network    Max Per Benefit Period Year-to-Date Remaining  CoInsurance  20%    Deductible  $3500.00 $3500.00  Out-Of-Pocket 3  $7000.00 $5964.90   In Network  [employer] Coinsurance Authorization Required  Not Applicable  20% per Service Year  No

## 2018-06-19 NOTE — Progress Notes (Signed)
Subjective: [name] is a 58 y.o. male patient seen today in office for POV #12 (DOS 02/14/2018), S/P right hallux flexor tenotomy performed in office.  Patient comes to office for a wound check prior to his scheduled surgery on Monday.  Patient reports that the first toe seems to be doing all right however the second toe still draining and bleeding and he is not sure if it is worse.  Patient denies calf pain, denies headache, chest pain, shortness of breath, nausea, vomiting, fever, or chills. No other issues noted.   Fasting blood sugar this morning was 143, A1c 7.9.   Patient Active Problem List   Diagnosis Date Noted  . Laryngopharyngeal reflux (LPR) 02/07/2016  . GERD (gastroesophageal reflux disease) 12/27/2015  . Arthritis 11/16/2015  . Diabetes mellitus (HCC) 11/16/2015  . Hyperlipidemia 11/16/2015  . Localized edema 11/16/2015  . OBESITY 05/15/2007  . ADD 05/15/2007  . HYPERTENSION 05/15/2007  . Allergic rhinitis 05/15/2007  . SLEEP APNEA 05/15/2007  . COUGH 05/15/2007    Current Outpatient Medications on File Prior to Visit  Medication Sig Dispense Refill  . amLODipine (NORVASC) 5 MG tablet Take 5 mg by mouth daily.    Marland Kitchen. aspirin EC 81 MG tablet Take 81 mg by mouth daily.    . BD INSULIN SYRINGE U/F 31G X 5/16" 1 ML MISC See admin instructions.    . Cetirizine HCl (ZYRTEC ALLERGY PO) Take 10 mg by mouth daily.    Marland Kitchen. glimepiride (AMARYL) 4 MG tablet Take 4 mg by mouth daily with breakfast.     . insulin NPH-regular Human (NOVOLIN 70/30) (70-30) 100 UNIT/ML injection Inject 40 Units into the skin 2 (two) times daily with a meal.    . metFORMIN (GLUCOPHAGE-XR) 500 MG 24 hr tablet Take 1,000 mg by mouth 2 (two) times daily.     . montelukast (SINGULAIR) 10 MG tablet TAKE 1 TABLET BY MOUTH AT BEDTIME 30 tablet 3  . pantoprazole (PROTONIX) 40 MG tablet TAKE 1 TABLET BY MOUTH TWICE DAILY 60 tablet 3  . ranitidine (ZANTAC) 150 MG tablet Take 1 tablet (150 mg total) by mouth 2  (two) times daily. 60 tablet 2  . silver nitrate applicators 75-25 % applicator Apply topically 3 (three) times a week. 100 each 0  . simvastatin (ZOCOR) 40 MG tablet TAKE 1 TABLET BY MOUTH ONCE DAILY IN THE EVENING FOR 30 DAYS  11  . Spacer/Aero-Holding Chambers (AEROCHAMBER MV) inhaler Use as instructed 1 each 0  . sulfamethoxazole-trimethoprim (BACTRIM DS,SEPTRA DS) 800-160 MG tablet Take 1 tablet by mouth 2 (two) times daily. 28 tablet 0  . terbinafine (LAMISIL) 250 MG tablet Take 1 tablet (250 mg total) by mouth daily. 90 tablet 0   No current facility-administered medications on file prior to visit.     No Known Allergies  Objective: There were no vitals filed for this visit.  General: No acute distress, AAOx3  Right foot: There is a pinpoint ulceration measures less than 0.5 cm at the right second toe and at the right first toe distal tuft that appears to be still open with a wound that measures less than 0.3 cm with a granular base, capillary fill time <3 seconds in all digits, gross sensation present via light touch to right foot however protective sensation absent secondary to diabetes with neuropathy. +Right hallux rigidus, Hammertoe, and pes planus deformity, limited ankle range of motion previous history of an Achilles rupture that secondarily healing with contracture, no pain with palpation  to foot or calf compression.   Assessment and Plan:  Problem List Items Addressed This Visit      Endocrine   Diabetes mellitus (HCC)    Other Visit Diagnoses    Foot ulcer, right, limited to breakdown of skin (HCC)    -  Primary   S/P foot surgery, right       Contracture of tendon sheath       Hammertoe of right foot       Acquired equinus deformity of right foot          -Patient seen and evaluated -Re-Discussed treatment options for recurrent ulceration in the setting of mechanical hammertoe deformity and equinus and diabetes -Mechanically debrided ulcerations to healthy  bleeding base and hemostasis was achieved using manual pressure applied Betadine and Band-Aid to toes and advised patient to do the same at home daily until time for surgery --Patient to go to the OR on Monday consent was previously obtained for Right 1st and 2nd hammertoe repair with kwire vs screw and TAL with splint. Pre and Post op course explained. Risks, benefits, alternatives explained. No guarantees given or implied. Surgical booking slip submitted and provided patient with Surgical packet and info for [facility] as given last visit patient reports that he is already made phone calls to the facility and has got from the estimated quotes/out-of-pocket cost -Patient will bring crutches to be nonweightbearing for at minimum 2 weeks and already has a cam boot of which she will use after 2 weeks -Return after surgery for follow-up postoperative care  [name], DPM

## 2018-06-23 ENCOUNTER — Other Ambulatory Visit: Payer: Self-pay | Admitting: Sports Medicine

## 2018-06-23 DIAGNOSIS — Z9889 Other specified postprocedural states: Secondary | ICD-10-CM

## 2018-06-23 DIAGNOSIS — K219 Gastro-esophageal reflux disease without esophagitis: Secondary | ICD-10-CM | POA: Diagnosis not present

## 2018-06-23 DIAGNOSIS — M6701 Short Achilles tendon (acquired), right ankle: Secondary | ICD-10-CM | POA: Diagnosis not present

## 2018-06-23 DIAGNOSIS — M216X1 Other acquired deformities of right foot: Secondary | ICD-10-CM | POA: Diagnosis not present

## 2018-06-23 DIAGNOSIS — Q6651 Congenital pes planus, right foot: Secondary | ICD-10-CM | POA: Diagnosis not present

## 2018-06-23 DIAGNOSIS — M2041 Other hammer toe(s) (acquired), right foot: Secondary | ICD-10-CM | POA: Diagnosis not present

## 2018-06-23 MED ORDER — IBUPROFEN 800 MG PO TABS: 800.0000 mg | ORAL_TABLET | Freq: Three times a day (TID) | ORAL | 0 refills | Status: DC | PRN

## 2018-06-23 MED ORDER — PROMETHAZINE HCL 25 MG PO TABS: 25.0000 mg | ORAL_TABLET | Freq: Three times a day (TID) | ORAL | 0 refills | Status: DC | PRN

## 2018-06-23 MED ORDER — HYDROCODONE-ACETAMINOPHEN 10-325 MG PO TABS: 1.0000 | ORAL_TABLET | Freq: Four times a day (QID) | ORAL | 0 refills | Status: AC | PRN

## 2018-06-23 MED ORDER — DOCUSATE SODIUM 100 MG PO CAPS: 100.0000 mg | ORAL_CAPSULE | Freq: Two times a day (BID) | ORAL | 0 refills | Status: DC

## 2018-06-23 NOTE — Progress Notes (Signed)
Post op meds entered -Dr. S 

## 2018-06-24 ENCOUNTER — Telehealth: Payer: Self-pay | Admitting: Sports Medicine

## 2018-06-24 NOTE — Telephone Encounter (Signed)
S/p R 1st and 2nd hammertoe repair and achilles tendon lengthening performed 06-23-18 at [facility] by Dr. Marylene Land

## 2018-06-24 NOTE — Telephone Encounter (Signed)
Post op phone call made to patient. Patient reports that he is doing well. Had a little pain last night that was relieved with Norco. Reports no issues but does states that he has to call the surgical center to ask about his mask that he had on yesterday. I advised patient to continue with rest, ice, elevation, and his PRN meds. I advised patient to call the office if he has any ?s or concerns before his next office visit. Patient expressed thanks for my call. -Dr. Marylene Land

## 2018-07-02 ENCOUNTER — Ambulatory Visit (INDEPENDENT_AMBULATORY_CARE_PROVIDER_SITE_OTHER): Payer: BLUE CROSS/BLUE SHIELD | Admitting: Sports Medicine

## 2018-07-02 ENCOUNTER — Other Ambulatory Visit: Payer: Self-pay

## 2018-07-02 ENCOUNTER — Ambulatory Visit (INDEPENDENT_AMBULATORY_CARE_PROVIDER_SITE_OTHER): Payer: BLUE CROSS/BLUE SHIELD

## 2018-07-02 ENCOUNTER — Encounter: Payer: Self-pay | Admitting: Sports Medicine

## 2018-07-02 VITALS — Temp 97.9°F | Resp 16

## 2018-07-02 DIAGNOSIS — M2041 Other hammer toe(s) (acquired), right foot: Secondary | ICD-10-CM | POA: Diagnosis not present

## 2018-07-02 DIAGNOSIS — L97511 Non-pressure chronic ulcer of other part of right foot limited to breakdown of skin: Secondary | ICD-10-CM

## 2018-07-02 DIAGNOSIS — Z9889 Other specified postprocedural states: Secondary | ICD-10-CM

## 2018-07-02 DIAGNOSIS — E1140 Type 2 diabetes mellitus with diabetic neuropathy, unspecified: Secondary | ICD-10-CM

## 2018-07-02 DIAGNOSIS — M216X1 Other acquired deformities of right foot: Secondary | ICD-10-CM

## 2018-07-02 DIAGNOSIS — M6240 Contracture of muscle, unspecified site: Secondary | ICD-10-CM

## 2018-07-02 MED ORDER — SULFAMETHOXAZOLE-TRIMETHOPRIM 800-160 MG PO TABS: 1.0000 | ORAL_TABLET | Freq: Two times a day (BID) | ORAL | 0 refills | Status: DC

## 2018-07-02 NOTE — Progress Notes (Signed)
Subjective: [name] is a 58 y.o. male patient seen today in office for POV #1  (DOS 06/23/2018), S/P right Achilles tendon lengthening, right first and second hammertoe repair, patient denies pain at surgical site, denies calf pain, denies headache, chest pain, shortness of breath, nausea, vomiting, fever, or chills. Patient states that he did however fall off the bed 2 days ago while dreaming but denies any injury.  Patient is taking as needed Norco and Motrin and is using crutches to stay off his foot.  No other issues noted.   Fasting blood sugar this morning was 167.  Patient Active Problem List   Diagnosis Date Noted  . Laryngopharyngeal reflux (LPR) 02/07/2016  . GERD (gastroesophageal reflux disease) 12/27/2015  . Arthritis 11/16/2015  . Diabetes mellitus (HCC) 11/16/2015  . Hyperlipidemia 11/16/2015  . Localized edema 11/16/2015  . OBESITY 05/15/2007  . ADD 05/15/2007  . HYPERTENSION 05/15/2007  . Allergic rhinitis 05/15/2007  . SLEEP APNEA 05/15/2007  . COUGH 05/15/2007    Current Outpatient Medications on File Prior to Visit  Medication Sig Dispense Refill  . amLODipine (NORVASC) 5 MG tablet Take 5 mg by mouth daily.    Marland Kitchen aspirin EC 81 MG tablet Take 81 mg by mouth daily.    . BD INSULIN SYRINGE U/F 31G X 5/16" 1 ML MISC See admin instructions.    . Cetirizine HCl (ZYRTEC ALLERGY PO) Take 10 mg by mouth daily.    Marland Kitchen docusate sodium (COLACE) 100 MG capsule Take 1 capsule (100 mg total) by mouth 2 (two) times daily. 10 capsule 0  . glimepiride (AMARYL) 4 MG tablet Take 4 mg by mouth daily with breakfast.     . ibuprofen (ADVIL) 800 MG tablet Take 1 tablet (800 mg total) by mouth every 8 (eight) hours as needed. 30 tablet 0  . insulin NPH-regular Human (NOVOLIN 70/30) (70-30) 100 UNIT/ML injection Inject 40 Units into the skin 2 (two) times daily with a meal.    . metFORMIN (GLUCOPHAGE-XR) 500 MG 24 hr tablet Take 1,000 mg by mouth 2 (two) times daily.     . montelukast  (SINGULAIR) 10 MG tablet TAKE 1 TABLET BY MOUTH AT BEDTIME 30 tablet 3  . pantoprazole (PROTONIX) 40 MG tablet TAKE 1 TABLET BY MOUTH TWICE DAILY 60 tablet 3  . promethazine (PHENERGAN) 25 MG tablet Take 1 tablet (25 mg total) by mouth every 8 (eight) hours as needed for nausea or vomiting. 20 tablet 0  . ranitidine (ZANTAC) 150 MG tablet Take 1 tablet (150 mg total) by mouth 2 (two) times daily. 60 tablet 2  . silver nitrate applicators 75-25 % applicator Apply topically 3 (three) times a week. 100 each 0  . simvastatin (ZOCOR) 40 MG tablet TAKE 1 TABLET BY MOUTH ONCE DAILY IN THE EVENING FOR 30 DAYS  11  . Spacer/Aero-Holding Chambers (AEROCHAMBER MV) inhaler Use as instructed 1 each 0  . terbinafine (LAMISIL) 250 MG tablet Take 1 tablet (250 mg total) by mouth daily. 90 tablet 0   No current facility-administered medications on file prior to visit.     No Known Allergies  Objective: There were no vitals filed for this visit.  General: No acute distress, AAOx3  Right foot: Sutures intact with no gapping or dehiscence at surgical sitesx3, moderate swelling to first and second toes with dried blood blisters at the tips, faint blanchable erythema, mild warmth, no drainage, no other signs of infection noted, Capillary fill time <3 seconds in all digits,  gross sensation present via light touch to right foot. No pain or crepitation with range of motion right foot.  No pain with calf compression.   Post Op Xray, Right foot: There are no acute findings hardware intact at right first and second toes with rectus digital presentation and alignment, soft Ross swelling within normal limits for post op status.   Assessment and Plan:  Problem List Items Addressed This Visit      Endocrine   Diabetes mellitus (HCC)    Other Visit Diagnoses    S/P foot surgery, right    -  Primary   Relevant Orders   DG Foot Complete Right   Foot ulcer, right, limited to breakdown of skin (HCC)       Hammertoe of  right foot       Contracture of tendon sheath       Acquired equinus deformity of right foot           -Patient seen and evaluated -X-rays reviewed -Applied Betadine and dry sterile dressing to surgical sites foot and lower leg secured with ACE wrap and stockinet  -Advised patient to make sure to keep dressings clean, dry, and intact to right surgical site, removing the ACE as needed  -Advised patient now may use cam boot and must keep cam boot in place at all times to act like a cast and must continue with nonweightbearing with use of his crutches -Advised patient to limit activity to necessity  -Advised patient to ice and elevate as necessary  -Prescribed Bactrim for preventative measures for blanchable erythema to toes with significant warmth and swelling -Will plan for possible suture removal at next office visit. In the meantime, patient to call office if any issues or problems arise.   [name], DPM

## 2018-07-03 ENCOUNTER — Other Ambulatory Visit: Payer: Self-pay | Admitting: Sports Medicine

## 2018-07-03 DIAGNOSIS — L97511 Non-pressure chronic ulcer of other part of right foot limited to breakdown of skin: Secondary | ICD-10-CM

## 2018-07-03 DIAGNOSIS — Z9889 Other specified postprocedural states: Secondary | ICD-10-CM

## 2018-07-03 DIAGNOSIS — M2041 Other hammer toe(s) (acquired), right foot: Secondary | ICD-10-CM

## 2018-07-09 ENCOUNTER — Encounter: Payer: Self-pay | Admitting: Sports Medicine

## 2018-07-09 ENCOUNTER — Ambulatory Visit (INDEPENDENT_AMBULATORY_CARE_PROVIDER_SITE_OTHER): Payer: BLUE CROSS/BLUE SHIELD | Admitting: Sports Medicine

## 2018-07-09 ENCOUNTER — Other Ambulatory Visit: Payer: Self-pay

## 2018-07-09 VITALS — BP 194/103 | HR 81 | Temp 97.2°F | Resp 16

## 2018-07-09 DIAGNOSIS — Z9889 Other specified postprocedural states: Secondary | ICD-10-CM

## 2018-07-09 DIAGNOSIS — M2041 Other hammer toe(s) (acquired), right foot: Secondary | ICD-10-CM

## 2018-07-09 DIAGNOSIS — M6240 Contracture of muscle, unspecified site: Secondary | ICD-10-CM

## 2018-07-09 DIAGNOSIS — L97511 Non-pressure chronic ulcer of other part of right foot limited to breakdown of skin: Secondary | ICD-10-CM

## 2018-07-09 DIAGNOSIS — Z9119 Patient's noncompliance with other medical treatment and regimen: Secondary | ICD-10-CM

## 2018-07-09 DIAGNOSIS — E1140 Type 2 diabetes mellitus with diabetic neuropathy, unspecified: Secondary | ICD-10-CM

## 2018-07-09 DIAGNOSIS — Z91199 Patient's noncompliance with other medical treatment and regimen due to unspecified reason: Secondary | ICD-10-CM

## 2018-07-09 NOTE — Progress Notes (Signed)
Subjective: [name] is a 58 y.o. male patient seen today in office for POV # 2 (DOS 06/23/2018), S/P right Achilles tendon lengthening, right first and second hammertoe repair, patient denies pain at surgical site, denies calf pain, denies headache, chest pain, shortness of breath, nausea, vomiting, fever, or chills. Patient states that he did go to the beach for the holiday weekend and walked on the beach without his boot and did not use crutches against my recommendation.  Denies any issues.  Fasting blood sugar this morning was 153 and last A1c of 7.9.  Patient Active Problem List   Diagnosis Date Noted  . Laryngopharyngeal reflux (LPR) 02/07/2016  . GERD (gastroesophageal reflux disease) 12/27/2015  . Arthritis 11/16/2015  . Diabetes mellitus (HCC) 11/16/2015  . Hyperlipidemia 11/16/2015  . Localized edema 11/16/2015  . OBESITY 05/15/2007  . ADD 05/15/2007  . HYPERTENSION 05/15/2007  . Allergic rhinitis 05/15/2007  . SLEEP APNEA 05/15/2007  . COUGH 05/15/2007    Current Outpatient Medications on File Prior to Visit  Medication Sig Dispense Refill  . amLODipine (NORVASC) 5 MG tablet Take 5 mg by mouth daily.    Marland Kitchen. aspirin EC 81 MG tablet Take 81 mg by mouth daily.    . BD INSULIN SYRINGE U/F 31G X 5/16" 1 ML MISC See admin instructions.    . Cetirizine HCl (ZYRTEC ALLERGY PO) Take 10 mg by mouth daily.    Marland Kitchen. docusate sodium (COLACE) 100 MG capsule Take 1 capsule (100 mg total) by mouth 2 (two) times daily. 10 capsule 0  . glimepiride (AMARYL) 4 MG tablet Take 4 mg by mouth daily with breakfast.     . ibuprofen (ADVIL) 800 MG tablet Take 1 tablet (800 mg total) by mouth every 8 (eight) hours as needed. 30 tablet 0  . insulin NPH-regular Human (NOVOLIN 70/30) (70-30) 100 UNIT/ML injection Inject 40 Units into the skin 2 (two) times daily with a meal.    . metFORMIN (GLUCOPHAGE-XR) 500 MG 24 hr tablet Take 1,000 mg by mouth 2 (two) times daily.     . montelukast (SINGULAIR) 10 MG  tablet TAKE 1 TABLET BY MOUTH AT BEDTIME 30 tablet 3  . pantoprazole (PROTONIX) 40 MG tablet TAKE 1 TABLET BY MOUTH TWICE DAILY 60 tablet 3  . promethazine (PHENERGAN) 25 MG tablet Take 1 tablet (25 mg total) by mouth every 8 (eight) hours as needed for nausea or vomiting. 20 tablet 0  . ranitidine (ZANTAC) 150 MG tablet Take 1 tablet (150 mg total) by mouth 2 (two) times daily. 60 tablet 2  . silver nitrate applicators 75-25 % applicator Apply topically 3 (three) times a week. 100 each 0  . simvastatin (ZOCOR) 40 MG tablet TAKE 1 TABLET BY MOUTH ONCE DAILY IN THE EVENING FOR 30 DAYS  11  . Spacer/Aero-Holding Chambers (AEROCHAMBER MV) inhaler Use as instructed 1 each 0  . sulfamethoxazole-trimethoprim (BACTRIM DS) 800-160 MG tablet Take 1 tablet by mouth 2 (two) times daily. 28 tablet 0  . terbinafine (LAMISIL) 250 MG tablet Take 1 tablet (250 mg total) by mouth daily. 90 tablet 0   No current facility-administered medications on file prior to visit.     No Known Allergies  Objective: There were no vitals filed for this visit.  General: No acute distress, AAOx3  Right foot: Sutures intact with no gapping or dehiscence at surgical sitesx3, moderate swelling to first and second toes with dried blood blisters at the tips that is much improved since last  visit, faint blanchable erythema, mild warmth, no drainage, no other signs of infection noted, Capillary fill time <3 seconds in all digits, gross sensation present via light touch to right foot. No pain or crepitation with range of motion right foot.  No pain with calf compression.   Assessment and Plan:  Problem List Items Addressed This Visit      Endocrine   Diabetes mellitus (HCC)    Other Visit Diagnoses    S/P foot surgery, right    -  Primary   Foot ulcer, right, limited to breakdown of skin (HCC)       Hammertoe of right foot       Contracture of tendon sheath       Noncompliance          -Patient seen and  evaluated -Sutures removed at calf but sutures were left intact at toes 1 and 2 on right due to swelling -Applied Betadine and dry sterile dressing to surgical sites foot and lower leg secured with ACE wrap and stockinet  -Advised patient to make sure to keep dressings clean, dry, and intact to right surgical site, removing the ACE as needed  -Advised patient now may use cam boot and must keep cam boot in place at all times to act like a cast and must continue with nonweightbearing with use of his crutches -Stressed to the patient the importance of being compliant with staying off his foot to avoid excessive swelling in the toes which could cause dehiscence of the surgical site and issues with healing and complications -Advised patient to limit activity to necessity  -Advised patient to ice and elevate as necessary  -Continue with Bactrim until completed -Will plan for possible suture removal at next office visit. In the meantime, patient to call office if any issues or problems arise.   [name], DPM

## 2018-07-18 ENCOUNTER — Ambulatory Visit (INDEPENDENT_AMBULATORY_CARE_PROVIDER_SITE_OTHER): Payer: Self-pay | Admitting: Sports Medicine

## 2018-07-18 ENCOUNTER — Other Ambulatory Visit: Payer: Self-pay

## 2018-07-18 ENCOUNTER — Encounter: Payer: Self-pay | Admitting: Sports Medicine

## 2018-07-18 VITALS — Temp 96.5°F

## 2018-07-18 DIAGNOSIS — L97511 Non-pressure chronic ulcer of other part of right foot limited to breakdown of skin: Secondary | ICD-10-CM

## 2018-07-18 DIAGNOSIS — M2041 Other hammer toe(s) (acquired), right foot: Secondary | ICD-10-CM

## 2018-07-18 DIAGNOSIS — E1140 Type 2 diabetes mellitus with diabetic neuropathy, unspecified: Secondary | ICD-10-CM

## 2018-07-18 DIAGNOSIS — Z9889 Other specified postprocedural states: Secondary | ICD-10-CM

## 2018-07-18 DIAGNOSIS — M6240 Contracture of muscle, unspecified site: Secondary | ICD-10-CM

## 2018-07-18 NOTE — Progress Notes (Signed)
Subjective: James RomeRobert C Wrobleski is a 58 y.o. male patient seen today in office for POV # 3 (DOS 06/23/2018), S/P right Achilles tendon lengthening, right first and second hammertoe repair, patient denies pain at surgical site, denies calf pain, denies headache, chest pain, shortness of breath, nausea, vomiting, fever, or chills. Reports that he got his dressing wet this AM.   FBS not recorded.   Patient Active Problem List   Diagnosis Date Noted  . Laryngopharyngeal reflux (LPR) 02/07/2016  . GERD (gastroesophageal reflux disease) 12/27/2015  . Arthritis 11/16/2015  . Diabetes mellitus (HCC) 11/16/2015  . Hyperlipidemia 11/16/2015  . Localized edema 11/16/2015  . OBESITY 05/15/2007  . ADD 05/15/2007  . HYPERTENSION 05/15/2007  . Allergic rhinitis 05/15/2007  . SLEEP APNEA 05/15/2007  . COUGH 05/15/2007    Current Outpatient Medications on File Prior to Visit  Medication Sig Dispense Refill  . amLODipine (NORVASC) 5 MG tablet Take 5 mg by mouth daily.    Marland Kitchen. aspirin EC 81 MG tablet Take 81 mg by mouth daily.    . BD INSULIN SYRINGE U/F 31G X 5/16" 1 ML MISC See admin instructions.    . Cetirizine HCl (ZYRTEC ALLERGY PO) Take 10 mg by mouth daily.    Marland Kitchen. docusate sodium (COLACE) 100 MG capsule Take 1 capsule (100 mg total) by mouth 2 (two) times daily. 10 capsule 0  . glimepiride (AMARYL) 4 MG tablet Take 4 mg by mouth daily with breakfast.     . ibuprofen (ADVIL) 800 MG tablet Take 1 tablet (800 mg total) by mouth every 8 (eight) hours as needed. 30 tablet 0  . insulin NPH-regular Human (NOVOLIN 70/30) (70-30) 100 UNIT/ML injection Inject 40 Units into the skin 2 (two) times daily with a meal.    . metFORMIN (GLUCOPHAGE-XR) 500 MG 24 hr tablet Take 1,000 mg by mouth 2 (two) times daily.     . montelukast (SINGULAIR) 10 MG tablet TAKE 1 TABLET BY MOUTH AT BEDTIME 30 tablet 3  . pantoprazole (PROTONIX) 40 MG tablet TAKE 1 TABLET BY MOUTH TWICE DAILY 60 tablet 3  . promethazine (PHENERGAN) 25 MG  tablet Take 1 tablet (25 mg total) by mouth every 8 (eight) hours as needed for nausea or vomiting. 20 tablet 0  . ranitidine (ZANTAC) 150 MG tablet Take 1 tablet (150 mg total) by mouth 2 (two) times daily. 60 tablet 2  . silver nitrate applicators 75-25 % applicator Apply topically 3 (three) times a week. 100 each 0  . simvastatin (ZOCOR) 40 MG tablet TAKE 1 TABLET BY MOUTH ONCE DAILY IN THE EVENING FOR 30 DAYS  11  . Spacer/Aero-Holding Chambers (AEROCHAMBER MV) inhaler Use as instructed 1 each 0  . sulfamethoxazole-trimethoprim (BACTRIM DS) 800-160 MG tablet Take 1 tablet by mouth 2 (two) times daily. 28 tablet 0  . terbinafine (LAMISIL) 250 MG tablet Take 1 tablet (250 mg total) by mouth daily. 90 tablet 0   No current facility-administered medications on file prior to visit.     No Known Allergies  Objective: There were no vitals filed for this visit.  General: No acute distress, AAOx3  Right foot: Sutures intact with no gapping or dehiscence at surgical sitesx 2, moderate swelling to first and second toes, faint blanchable erythema, mild warmth, no drainage, no other signs of infection noted, Capillary fill time <3 seconds in all digits, gross sensation present via light touch to right foot. No pain or crepitation with range of motion right foot.  No pain  with calf compression.   Assessment and Plan:  Problem List Items Addressed This Visit      Endocrine   Diabetes mellitus (HCC)    Other Visit Diagnoses    S/P foot surgery, right    -  Primary   Foot ulcer, right, limited to breakdown of skin (HCC)       Hammertoe of right foot       Contracture of tendon sheath         -Patient seen and evaluated -A few sutures were removed -Applied Betadine and dry sterile dressing to surgical sites foot and lower leg secured with ACE wrap and stockinet  -Advised patient to make sure to keep dressings clean, dry, and intact to right surgical site, removing the ACE as needed  -Advised  patient to continue with NWB with CAM boot and crutches  -Advised patient to limit activity to necessity  -Advised patient to ice and elevate as necessary  -Will plan for xray and finishing suture removal at next office visit. If areas are healed will allow patient to weightbear with CAM boot at next visit. In the meantime, patient to call office if any issues or problems arise.   [name], DPM

## 2018-07-25 ENCOUNTER — Encounter: Payer: Self-pay | Admitting: Sports Medicine

## 2018-07-25 ENCOUNTER — Other Ambulatory Visit: Payer: Self-pay | Admitting: Sports Medicine

## 2018-07-25 ENCOUNTER — Other Ambulatory Visit: Payer: Self-pay

## 2018-07-25 ENCOUNTER — Ambulatory Visit (INDEPENDENT_AMBULATORY_CARE_PROVIDER_SITE_OTHER): Payer: Self-pay

## 2018-07-25 ENCOUNTER — Ambulatory Visit (INDEPENDENT_AMBULATORY_CARE_PROVIDER_SITE_OTHER): Payer: Self-pay | Admitting: Sports Medicine

## 2018-07-25 DIAGNOSIS — Z9119 Patient's noncompliance with other medical treatment and regimen: Secondary | ICD-10-CM

## 2018-07-25 DIAGNOSIS — Z9889 Other specified postprocedural states: Secondary | ICD-10-CM

## 2018-07-25 DIAGNOSIS — Z91199 Patient's noncompliance with other medical treatment and regimen due to unspecified reason: Secondary | ICD-10-CM

## 2018-07-25 DIAGNOSIS — L97511 Non-pressure chronic ulcer of other part of right foot limited to breakdown of skin: Secondary | ICD-10-CM

## 2018-07-25 DIAGNOSIS — M2041 Other hammer toe(s) (acquired), right foot: Secondary | ICD-10-CM

## 2018-07-25 DIAGNOSIS — M6240 Contracture of muscle, unspecified site: Secondary | ICD-10-CM

## 2018-07-25 DIAGNOSIS — E1140 Type 2 diabetes mellitus with diabetic neuropathy, unspecified: Secondary | ICD-10-CM

## 2018-07-25 NOTE — Progress Notes (Signed)
Subjective: [name] is a 58 y.o. male patient seen today in office for POV # 4 (DOS 06/23/2018), S/P right Achilles tendon lengthening, right first and second hammertoe repair, patient denies pain at surgical site, denies calf pain, denies headache, chest pain, shortness of breath, nausea, vomiting, fever, or chills. Reports that he has been doing well with no issues.  FBS not recorded.   Patient Active Problem List   Diagnosis Date Noted  . Laryngopharyngeal reflux (LPR) 02/07/2016  . GERD (gastroesophageal reflux disease) 12/27/2015  . Arthritis 11/16/2015  . Diabetes mellitus (HCC) 11/16/2015  . Hyperlipidemia 11/16/2015  . Localized edema 11/16/2015  . OBESITY 05/15/2007  . ADD 05/15/2007  . HYPERTENSION 05/15/2007  . Allergic rhinitis 05/15/2007  . SLEEP APNEA 05/15/2007  . COUGH 05/15/2007    Current Outpatient Medications on File Prior to Visit  Medication Sig Dispense Refill  . amLODipine (NORVASC) 5 MG tablet Take 5 mg by mouth daily.    Marland Kitchen. aspirin EC 81 MG tablet Take 81 mg by mouth daily.    . BD INSULIN SYRINGE U/F 31G X 5/16" 1 ML MISC See admin instructions.    . Cetirizine HCl (ZYRTEC ALLERGY PO) Take 10 mg by mouth daily.    Marland Kitchen. docusate sodium (COLACE) 100 MG capsule Take 1 capsule (100 mg total) by mouth 2 (two) times daily. 10 capsule 0  . glimepiride (AMARYL) 4 MG tablet Take 4 mg by mouth daily with breakfast.     . ibuprofen (ADVIL) 800 MG tablet Take 1 tablet (800 mg total) by mouth every 8 (eight) hours as needed. 30 tablet 0  . insulin NPH-regular Human (NOVOLIN 70/30) (70-30) 100 UNIT/ML injection Inject 40 Units into the skin 2 (two) times daily with a meal.    . metFORMIN (GLUCOPHAGE-XR) 500 MG 24 hr tablet Take 1,000 mg by mouth 2 (two) times daily.     . montelukast (SINGULAIR) 10 MG tablet TAKE 1 TABLET BY MOUTH AT BEDTIME 30 tablet 3  . pantoprazole (PROTONIX) 40 MG tablet TAKE 1 TABLET BY MOUTH TWICE DAILY 60 tablet 3  . promethazine (PHENERGAN)  25 MG tablet Take 1 tablet (25 mg total) by mouth every 8 (eight) hours as needed for nausea or vomiting. 20 tablet 0  . ranitidine (ZANTAC) 150 MG tablet Take 1 tablet (150 mg total) by mouth 2 (two) times daily. 60 tablet 2  . silver nitrate applicators 75-25 % applicator Apply topically 3 (three) times a week. 100 each 0  . simvastatin (ZOCOR) 40 MG tablet TAKE 1 TABLET BY MOUTH ONCE DAILY IN THE EVENING FOR 30 DAYS  11  . Spacer/Aero-Holding Chambers (AEROCHAMBER MV) inhaler Use as instructed 1 each 0  . sulfamethoxazole-trimethoprim (BACTRIM DS) 800-160 MG tablet Take 1 tablet by mouth 2 (two) times daily. 28 tablet 0  . terbinafine (LAMISIL) 250 MG tablet Take 1 tablet (250 mg total) by mouth daily. 90 tablet 0   No current facility-administered medications on file prior to visit.     No Known Allergies  Objective: There were no vitals filed for this visit.  General: No acute distress, AAOx3  Right foot: Remaining sutures intact with no gapping or dehiscence at surgical sitesx 2, moderate swelling to first and second toes, faint blanchable erythema, mild warmth, no drainage, no other signs of infection noted, Capillary fill time <3 seconds in all digits, gross sensation present via light touch to right foot. No pain or crepitation with range of motion right foot.  No  pain with calf compression.   X-rays consistent with postoperative status hardware intact at first and second toes  Assessment and Plan:  Problem List Items Addressed This Visit      Endocrine   Diabetes mellitus ([place])    Other Visit Diagnoses    S/P foot surgery, right    -  Primary   Foot ulcer, right, limited to breakdown of skin ([place])       Hammertoe of right foot       Contracture of tendon sheath       Noncompliance         -Patient seen and evaluated -X-rays reviewed -Remaining sutures removed -Applied Betadine and dry sterile dressing to surgical sites foot and lower leg secured with ACE wrap and  stockinet  -Advised patient to make sure to keep dressings clean, dry, and intact to right surgical site, removing the ACE as needed for today however on tomorrow may remove dressing and may shower -Patient may weight-bear with cam boot -Advised patient to limit activity to necessity  -Advised patient to ice and elevate as necessary  -Will plan for postop wound check at next office visit. In the meantime, patient to call office if any issues or problems arise.   Landis Martins, DPM

## 2018-08-05 DIAGNOSIS — E113411 Type 2 diabetes mellitus with severe nonproliferative diabetic retinopathy with macular edema, right eye: Secondary | ICD-10-CM | POA: Diagnosis not present

## 2018-08-05 DIAGNOSIS — E113412 Type 2 diabetes mellitus with severe nonproliferative diabetic retinopathy with macular edema, left eye: Secondary | ICD-10-CM | POA: Diagnosis not present

## 2018-08-05 DIAGNOSIS — H2511 Age-related nuclear cataract, right eye: Secondary | ICD-10-CM | POA: Diagnosis not present

## 2018-08-05 DIAGNOSIS — H3561 Retinal hemorrhage, right eye: Secondary | ICD-10-CM | POA: Diagnosis not present

## 2018-08-07 ENCOUNTER — Other Ambulatory Visit: Payer: Self-pay

## 2018-08-07 ENCOUNTER — Encounter: Payer: Self-pay | Admitting: Sports Medicine

## 2018-08-07 ENCOUNTER — Ambulatory Visit (INDEPENDENT_AMBULATORY_CARE_PROVIDER_SITE_OTHER): Payer: Self-pay | Admitting: Sports Medicine

## 2018-08-07 DIAGNOSIS — E1140 Type 2 diabetes mellitus with diabetic neuropathy, unspecified: Secondary | ICD-10-CM

## 2018-08-07 DIAGNOSIS — Z91199 Patient's noncompliance with other medical treatment and regimen due to unspecified reason: Secondary | ICD-10-CM

## 2018-08-07 DIAGNOSIS — Z9119 Patient's noncompliance with other medical treatment and regimen: Secondary | ICD-10-CM

## 2018-08-07 DIAGNOSIS — M216X1 Other acquired deformities of right foot: Secondary | ICD-10-CM

## 2018-08-07 DIAGNOSIS — M6240 Contracture of muscle, unspecified site: Secondary | ICD-10-CM

## 2018-08-07 DIAGNOSIS — L97511 Non-pressure chronic ulcer of other part of right foot limited to breakdown of skin: Secondary | ICD-10-CM

## 2018-08-07 DIAGNOSIS — M2041 Other hammer toe(s) (acquired), right foot: Secondary | ICD-10-CM

## 2018-08-07 DIAGNOSIS — Z9889 Other specified postprocedural states: Secondary | ICD-10-CM

## 2018-08-07 NOTE — Progress Notes (Signed)
Subjective: [name] is a 58 y.o. male patient seen today in office for POV # 5 (DOS 06/23/2018), S/P right Achilles tendon lengthening, right first and second hammertoe repair, patient denies pain at surgical site, denies calf pain, denies headache, chest pain, shortness of breath, nausea, vomiting, fever, or chills. Reports that he went away to girlfriend family house this weekend and bumped 1st toe and experienced bleeding that he treated with neosporin and bandaid and admits that he does walk around barefoot and has wore a normal shoe once.   FBS 179  Patient Active Problem List   Diagnosis Date Noted  . Laryngopharyngeal reflux (LPR) 02/07/2016  . GERD (gastroesophageal reflux disease) 12/27/2015  . Arthritis 11/16/2015  . Diabetes mellitus ([place]) 11/16/2015  . Hyperlipidemia 11/16/2015  . Localized edema 11/16/2015  . OBESITY 05/15/2007  . ADD 05/15/2007  . HYPERTENSION 05/15/2007  . Allergic rhinitis 05/15/2007  . SLEEP APNEA 05/15/2007  . COUGH 05/15/2007    Current Outpatient Medications on File Prior to Visit  Medication Sig Dispense Refill  . amLODipine (NORVASC) 5 MG tablet Take 5 mg by mouth daily.    Marland Kitchen aspirin EC 81 MG tablet Take 81 mg by mouth daily.    . BD INSULIN SYRINGE U/F 31G X 5/16" 1 ML MISC See admin instructions.    . Cetirizine HCl (ZYRTEC ALLERGY PO) Take 10 mg by mouth daily.    Marland Kitchen docusate sodium (COLACE) 100 MG capsule Take 1 capsule (100 mg total) by mouth 2 (two) times daily. 10 capsule 0  . glimepiride (AMARYL) 4 MG tablet Take 4 mg by mouth daily with breakfast.     . ibuprofen (ADVIL) 800 MG tablet Take 1 tablet (800 mg total) by mouth every 8 (eight) hours as needed. 30 tablet 0  . insulin NPH-regular Human (NOVOLIN 70/30) (70-30) 100 UNIT/ML injection Inject 40 Units into the skin 2 (two) times daily with a meal.    . metFORMIN (GLUCOPHAGE-XR) 500 MG 24 hr tablet Take 1,000 mg by mouth 2 (two) times daily.     . montelukast (SINGULAIR) 10 MG  tablet TAKE 1 TABLET BY MOUTH AT BEDTIME 30 tablet 3  . pantoprazole (PROTONIX) 40 MG tablet TAKE 1 TABLET BY MOUTH TWICE DAILY 60 tablet 3  . promethazine (PHENERGAN) 25 MG tablet Take 1 tablet (25 mg total) by mouth every 8 (eight) hours as needed for nausea or vomiting. 20 tablet 0  . ranitidine (ZANTAC) 150 MG tablet Take 1 tablet (150 mg total) by mouth 2 (two) times daily. 60 tablet 2  . silver nitrate applicators 38-75 % applicator Apply topically 3 (three) times a week. 100 each 0  . simvastatin (ZOCOR) 40 MG tablet TAKE 1 TABLET BY MOUTH ONCE DAILY IN THE EVENING FOR 30 DAYS  11  . Spacer/Aero-Holding Chambers (AEROCHAMBER MV) inhaler Use as instructed 1 each 0  . sulfamethoxazole-trimethoprim (BACTRIM DS) 800-160 MG tablet Take 1 tablet by mouth 2 (two) times daily. 28 tablet 0  . terbinafine (LAMISIL) 250 MG tablet Take 1 tablet (250 mg total) by mouth daily. 90 tablet 0   No current facility-administered medications on file prior to visit.     No Known Allergies  Objective: There were no vitals filed for this visit.  General: No acute distress, AAOx3  Right foot: Surgical site healed, there is a new abrasion at sub met 5 on right that measures less than 0.5cm with no surrounding signs of infections. + mild swelling to first and second  toes, faint blanchable erythema, no warmth, no drainage, no other signs of infection noted, Capillary fill time <3 seconds in all digits, gross sensation present via light touch to right foot. No pain or crepitation with range of motion right foot.  No pain with calf compression.   Assessment and Plan:  Problem List Items Addressed This Visit      Endocrine   Diabetes mellitus ([place])    Other Visit Diagnoses    S/P foot surgery, right    -  Primary   Foot ulcer, right, limited to breakdown of skin ([place])       Healed at right 1st toe but now new abrasion/ulcer sub met 5 on right   Hammertoe of right foot       Contracture of tendon sheath        Noncompliance       Acquired equinus deformity of right foot         -Patient seen and evaluated -Advised patient to use neosporin and bandaid to right sub met 5 and to refrain from walking barefoot and trying to wear a normal shoes until 2 weeks from now -Patient to now use post op shoe and then slowly transition to tennis shoe after 2 weeks -Advised patient to limit activity to necessity  -Advised patient to ice and elevate as necessary  -Will plan for postop wound check at next office visit. In the meantime, patient to call office if any issues or problems arise.   Landis Martins, DPM

## 2018-08-19 ENCOUNTER — Encounter: Payer: Self-pay | Admitting: Sports Medicine

## 2018-08-19 ENCOUNTER — Ambulatory Visit (INDEPENDENT_AMBULATORY_CARE_PROVIDER_SITE_OTHER): Payer: Self-pay | Admitting: Sports Medicine

## 2018-08-19 ENCOUNTER — Other Ambulatory Visit: Payer: Self-pay

## 2018-08-19 VITALS — Temp 97.3°F

## 2018-08-19 DIAGNOSIS — Z91199 Patient's noncompliance with other medical treatment and regimen due to unspecified reason: Secondary | ICD-10-CM

## 2018-08-19 DIAGNOSIS — E1140 Type 2 diabetes mellitus with diabetic neuropathy, unspecified: Secondary | ICD-10-CM

## 2018-08-19 DIAGNOSIS — Z9119 Patient's noncompliance with other medical treatment and regimen: Secondary | ICD-10-CM

## 2018-08-19 DIAGNOSIS — T25222A Burn of second degree of left foot, initial encounter: Secondary | ICD-10-CM | POA: Diagnosis not present

## 2018-08-19 DIAGNOSIS — T25021A Burn of unspecified degree of right foot, initial encounter: Secondary | ICD-10-CM

## 2018-08-19 DIAGNOSIS — S90415A Abrasion, left lesser toe(s), initial encounter: Secondary | ICD-10-CM
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
## 2018-08-20 DIAGNOSIS — E113411 Type 2 diabetes mellitus with severe nonproliferative diabetic retinopathy with macular edema, right eye: Secondary | ICD-10-CM | POA: Diagnosis not present

## 2018-08-20 DIAGNOSIS — E113412 Type 2 diabetes mellitus with severe nonproliferative diabetic retinopathy with macular edema, left eye: Secondary | ICD-10-CM | POA: Diagnosis not present

## 2018-08-29 ENCOUNTER — Ambulatory Visit: Payer: Self-pay | Admitting: Sports Medicine

## 2018-09-03 DIAGNOSIS — E113411 Type 2 diabetes mellitus with severe nonproliferative diabetic retinopathy with macular edema, right eye: Secondary | ICD-10-CM | POA: Diagnosis not present

## 2018-09-03 DIAGNOSIS — E113412 Type 2 diabetes mellitus with severe nonproliferative diabetic retinopathy with macular edema, left eye: Secondary | ICD-10-CM | POA: Diagnosis not present

## 2018-09-04 ENCOUNTER — Encounter: Payer: Self-pay | Admitting: Sports Medicine

## 2018-09-04 ENCOUNTER — Ambulatory Visit (INDEPENDENT_AMBULATORY_CARE_PROVIDER_SITE_OTHER): Payer: Self-pay | Admitting: Sports Medicine

## 2018-09-04 ENCOUNTER — Other Ambulatory Visit: Payer: Self-pay

## 2018-09-04 VITALS — Temp 95.7°F | Resp 16

## 2018-09-04 DIAGNOSIS — Z91199 Patient's noncompliance with other medical treatment and regimen due to unspecified reason: Secondary | ICD-10-CM

## 2018-09-04 DIAGNOSIS — Z9119 Patient's noncompliance with other medical treatment and regimen: Secondary | ICD-10-CM

## 2018-09-04 DIAGNOSIS — T148XXA Other injury of unspecified body region, initial encounter: Secondary | ICD-10-CM

## 2018-09-04 DIAGNOSIS — Z9889 Other specified postprocedural states: Secondary | ICD-10-CM

## 2018-09-04 DIAGNOSIS — T25021A Burn of unspecified degree of right foot, initial encounter: Secondary | ICD-10-CM

## 2018-09-04 DIAGNOSIS — E1140 Type 2 diabetes mellitus with diabetic neuropathy, unspecified: Secondary | ICD-10-CM
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
## 2018-09-12 DIAGNOSIS — G4733 Obstructive sleep apnea (adult) (pediatric): Secondary | ICD-10-CM | POA: Diagnosis not present

## 2018-09-18 ENCOUNTER — Encounter: Payer: Self-pay | Admitting: Sports Medicine

## 2018-09-18 ENCOUNTER — Ambulatory Visit (INDEPENDENT_AMBULATORY_CARE_PROVIDER_SITE_OTHER): Payer: BLUE CROSS/BLUE SHIELD | Admitting: Sports Medicine

## 2018-09-18 ENCOUNTER — Other Ambulatory Visit: Payer: Self-pay

## 2018-09-18 VITALS — Temp 96.7°F | Resp 16

## 2018-09-18 DIAGNOSIS — Z9119 Patient's noncompliance with other medical treatment and regimen: Secondary | ICD-10-CM

## 2018-09-18 DIAGNOSIS — Z9889 Other specified postprocedural states: Secondary | ICD-10-CM

## 2018-09-18 DIAGNOSIS — E1140 Type 2 diabetes mellitus with diabetic neuropathy, unspecified: Secondary | ICD-10-CM

## 2018-09-18 DIAGNOSIS — Z91199 Patient's noncompliance with other medical treatment and regimen due to unspecified reason: Secondary | ICD-10-CM

## 2018-09-18 DIAGNOSIS — T148XXA Other injury of unspecified body region, initial encounter: Secondary | ICD-10-CM

## 2018-09-18 DIAGNOSIS — T25021D Burn of unspecified degree of right foot, subsequent encounter: Secondary | ICD-10-CM

## 2018-09-18 MED ORDER — SULFAMETHOXAZOLE-TRIMETHOPRIM 400-80 MG PO TABS: 1.0000 | ORAL_TABLET | Freq: Two times a day (BID) | ORAL | 0 refills | Status: DC

## 2018-09-18 NOTE — Patient Instructions (Signed)
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
## 2018-10-03 ENCOUNTER — Encounter: Payer: Self-pay | Admitting: Sports Medicine

## 2018-10-03 ENCOUNTER — Ambulatory Visit (INDEPENDENT_AMBULATORY_CARE_PROVIDER_SITE_OTHER): Payer: BC Managed Care – PPO | Admitting: Sports Medicine

## 2018-10-03 ENCOUNTER — Other Ambulatory Visit: Payer: Self-pay

## 2018-10-03 DIAGNOSIS — M2041 Other hammer toe(s) (acquired), right foot: Secondary | ICD-10-CM

## 2018-10-03 DIAGNOSIS — E1140 Type 2 diabetes mellitus with diabetic neuropathy, unspecified: Secondary | ICD-10-CM

## 2018-10-03 DIAGNOSIS — T25021D Burn of unspecified degree of right foot, subsequent encounter: Secondary | ICD-10-CM

## 2018-10-03 DIAGNOSIS — T148XXA Other injury of unspecified body region, initial encounter: Secondary | ICD-10-CM

## 2018-10-03 DIAGNOSIS — Z9889 Other specified postprocedural states: Secondary | ICD-10-CM

## 2018-10-03 DIAGNOSIS — L97511 Non-pressure chronic ulcer of other part of right foot limited to breakdown of skin: Secondary | ICD-10-CM
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
## 2018-10-08 DIAGNOSIS — E291 Testicular hypofunction: Secondary | ICD-10-CM | POA: Diagnosis not present

## 2018-10-13 DIAGNOSIS — G4733 Obstructive sleep apnea (adult) (pediatric): Secondary | ICD-10-CM | POA: Diagnosis not present

## 2018-10-24 ENCOUNTER — Other Ambulatory Visit: Payer: Self-pay

## 2018-10-24 ENCOUNTER — Ambulatory Visit: Payer: BC Managed Care – PPO | Admitting: Sports Medicine

## 2018-10-24 ENCOUNTER — Ambulatory Visit (INDEPENDENT_AMBULATORY_CARE_PROVIDER_SITE_OTHER): Payer: BC Managed Care – PPO

## 2018-10-24 DIAGNOSIS — E1140 Type 2 diabetes mellitus with diabetic neuropathy, unspecified: Secondary | ICD-10-CM

## 2018-10-24 DIAGNOSIS — Z9889 Other specified postprocedural states: Secondary | ICD-10-CM

## 2018-10-24 DIAGNOSIS — M6240 Contracture of muscle, unspecified site: Secondary | ICD-10-CM

## 2018-10-24 DIAGNOSIS — T25021D Burn of unspecified degree of right foot, subsequent encounter: Secondary | ICD-10-CM

## 2018-10-24 DIAGNOSIS — Z9119 Patient's noncompliance with other medical treatment and regimen: Secondary | ICD-10-CM

## 2018-10-24 DIAGNOSIS — L97511 Non-pressure chronic ulcer of other part of right foot limited to breakdown of skin: Secondary | ICD-10-CM

## 2018-10-24 DIAGNOSIS — Z91199 Patient's noncompliance with other medical treatment and regimen due to unspecified reason: Secondary | ICD-10-CM

## 2018-10-24 DIAGNOSIS — M2041 Other hammer toe(s) (acquired), right foot: Secondary | ICD-10-CM

## 2018-10-24 MED ORDER — AMOXICILLIN-POT CLAVULANATE 875-125 MG PO TABS: 1.0000 | ORAL_TABLET | Freq: Two times a day (BID) | ORAL | 0 refills | Status: DC

## 2018-10-24 MED ORDER — SULFAMETHOXAZOLE-TRIMETHOPRIM 400-80 MG PO TABS: 1.0000 | ORAL_TABLET | Freq: Two times a day (BID) | ORAL | 0 refills | Status: DC

## 2018-10-25 ENCOUNTER — Encounter: Payer: Self-pay | Admitting: Sports Medicine
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
## 2018-10-29 DIAGNOSIS — G4733 Obstructive sleep apnea (adult) (pediatric): Secondary | ICD-10-CM | POA: Diagnosis not present

## 2018-10-30 ENCOUNTER — Other Ambulatory Visit: Payer: Self-pay

## 2018-10-30 ENCOUNTER — Ambulatory Visit (INDEPENDENT_AMBULATORY_CARE_PROVIDER_SITE_OTHER): Payer: BC Managed Care – PPO | Admitting: Sports Medicine

## 2018-10-30 ENCOUNTER — Encounter: Payer: Self-pay | Admitting: Sports Medicine

## 2018-10-30 DIAGNOSIS — Z9119 Patient's noncompliance with other medical treatment and regimen: Secondary | ICD-10-CM

## 2018-10-30 DIAGNOSIS — L97511 Non-pressure chronic ulcer of other part of right foot limited to breakdown of skin: Secondary | ICD-10-CM

## 2018-10-30 DIAGNOSIS — E1140 Type 2 diabetes mellitus with diabetic neuropathy, unspecified: Secondary | ICD-10-CM

## 2018-10-30 DIAGNOSIS — Z9889 Other specified postprocedural states: Secondary | ICD-10-CM

## 2018-10-30 DIAGNOSIS — Z91199 Patient's noncompliance with other medical treatment and regimen due to unspecified reason: Secondary | ICD-10-CM

## 2018-10-30 DIAGNOSIS — M2041 Other hammer toe(s) (acquired), right foot: Secondary | ICD-10-CM

## 2018-10-30 NOTE — Progress Notes (Signed)
Subjective: [name] is a 58 y.o. male patient seen today in office for POV #1 (DOS 10-24-18), S/P R 3rd toe tenotomy performed in office. Patient denies pain at surgical site, denies calf pain, denies headache, chest pain, shortness of breath, nausea, vomiting, fever, or chills. Patient states that he is doing well applying Iodosorb to the ulcerated areas as instructed.  No other issues noted.   Patient Active Problem List   Diagnosis Date Noted  . Laryngopharyngeal reflux (LPR) 02/07/2016  . GERD (gastroesophageal reflux disease) 12/27/2015  . Arthritis 11/16/2015  . Diabetes mellitus ([place]) 11/16/2015  . Hyperlipidemia 11/16/2015  . Localized edema 11/16/2015  . OBESITY 05/15/2007  . ADD 05/15/2007  . HYPERTENSION 05/15/2007  . Allergic rhinitis 05/15/2007  . SLEEP APNEA 05/15/2007  . COUGH 05/15/2007    Current Outpatient Medications on File Prior to Visit  Medication Sig Dispense Refill  . amLODipine (NORVASC) 5 MG tablet Take 5 mg by mouth daily.    Marland Kitchen amoxicillin-clavulanate (AUGMENTIN) 875-125 MG tablet Take 1 tablet by mouth 2 (two) times daily. 28 tablet 0  . aspirin EC 81 MG tablet Take 81 mg by mouth daily.    . BD INSULIN SYRINGE U/F 31G X 5/16" 1 ML MISC See admin instructions.    . Cetirizine HCl (ZYRTEC ALLERGY PO) Take 10 mg by mouth daily.    Marland Kitchen docusate sodium (COLACE) 100 MG capsule Take 1 capsule (100 mg total) by mouth 2 (two) times daily. 10 capsule 0  . glimepiride (AMARYL) 4 MG tablet Take 4 mg by mouth daily with breakfast.     . ibuprofen (ADVIL) 800 MG tablet Take 1 tablet (800 mg total) by mouth every 8 (eight) hours as needed. 30 tablet 0  . insulin NPH-regular Human (NOVOLIN 70/30) (70-30) 100 UNIT/ML injection Inject 40 Units into the skin 2 (two) times daily with a meal.    . metFORMIN (GLUCOPHAGE-XR) 500 MG 24 hr tablet Take 1,000 mg by mouth 2 (two) times daily.     . montelukast (SINGULAIR) 10 MG tablet TAKE 1 TABLET BY MOUTH AT BEDTIME 30 tablet  3  . pantoprazole (PROTONIX) 40 MG tablet TAKE 1 TABLET BY MOUTH TWICE DAILY 60 tablet 3  . promethazine (PHENERGAN) 25 MG tablet Take 1 tablet (25 mg total) by mouth every 8 (eight) hours as needed for nausea or vomiting. 20 tablet 0  . ranitidine (ZANTAC) 150 MG tablet Take 1 tablet (150 mg total) by mouth 2 (two) times daily. 60 tablet 2  . silver nitrate applicators 22-48 % applicator Apply topically 3 (three) times a week. 100 each 0  . simvastatin (ZOCOR) 40 MG tablet TAKE 1 TABLET BY MOUTH ONCE DAILY IN THE EVENING FOR 30 DAYS  11  . Spacer/Aero-Holding Chambers (AEROCHAMBER MV) inhaler Use as instructed 1 each 0  . SSD 1 % cream APPLY CREAM TO AFFECTED AREA ONCE DAILY UNTIL BURN IS HEALED    . sulfamethoxazole-trimethoprim (BACTRIM) 400-80 MG tablet Take 1 tablet by mouth 2 (two) times daily. 28 tablet 0  . terbinafine (LAMISIL) 250 MG tablet Take 1 tablet (250 mg total) by mouth daily. 90 tablet 0   No current facility-administered medications on file prior to visit.     No Known Allergies  Objective: There were no vitals filed for this visit.  General: No acute distress, AAOx3  Focused foot exam: Surgical site healed plantar aspect of right third toe, previous burn to plantar forefoot well-healed there is a pinpoint ulceration at  right 3rd toe that now measures 0.1 x 0.1cm  and sub met 5 ulceration that is pinpoint in nature once debrided revealing a granular base that measures 0.5 x 0.6 cm with granular tissues with no signs of infection. + mild swelling to toes no drainage, no other signs of infection noted, Capillary fill time <3 seconds in all digits, protective sensation diminished right greater than left no pain or crepitation with range of motion right foot.  Third through fifth hammertoe deformity, no pain with calf compression.  Abrasions noted to the left toes without signs of infection. Ankle instability reported bilateral.  Assessment and Plan:  Problem List Items  Addressed This Visit      Endocrine   Diabetes mellitus ([place])    Other Visit Diagnoses    S/P foot surgery, right    -  Primary   Foot ulcer, right, limited to breakdown of skin ([place])       Hammertoe of right foot       Noncompliance           -Patient seen and evaluated -Applied Iodosorb and Band-Aid dressings to the right foot and instructed patient to do the same at home daily -Patient to continue with ankle brace as tolerated -Patient to continue with Bactrim until completed -Will plan for wound check here in 2 weeks if the plantar ulcer is not better patient may have to return to using his cam boot. Landis Martins, DPM

## 2018-11-12 DIAGNOSIS — G4733 Obstructive sleep apnea (adult) (pediatric): Secondary | ICD-10-CM | POA: Diagnosis not present

## 2018-11-13 ENCOUNTER — Ambulatory Visit: Payer: BC Managed Care – PPO | Admitting: Sports Medicine

## 2018-11-14 ENCOUNTER — Ambulatory Visit: Payer: BC Managed Care – PPO | Admitting: Sports Medicine

## 2018-11-20 ENCOUNTER — Ambulatory Visit: Payer: BC Managed Care – PPO | Admitting: Sports Medicine

## 2018-11-20 ENCOUNTER — Encounter: Payer: Self-pay | Admitting: Sports Medicine

## 2018-11-20 ENCOUNTER — Other Ambulatory Visit: Payer: Self-pay

## 2018-11-20 DIAGNOSIS — L97511 Non-pressure chronic ulcer of other part of right foot limited to breakdown of skin: Secondary | ICD-10-CM | POA: Diagnosis not present

## 2018-11-20 DIAGNOSIS — Z9119 Patient's noncompliance with other medical treatment and regimen: Secondary | ICD-10-CM

## 2018-11-20 DIAGNOSIS — Z9889 Other specified postprocedural states: Secondary | ICD-10-CM

## 2018-11-20 DIAGNOSIS — M2041 Other hammer toe(s) (acquired), right foot: Secondary | ICD-10-CM

## 2018-11-20 DIAGNOSIS — E1140 Type 2 diabetes mellitus with diabetic neuropathy, unspecified: Secondary | ICD-10-CM

## 2018-11-20 DIAGNOSIS — Z91199 Patient's noncompliance with other medical treatment and regimen due to unspecified reason: Secondary | ICD-10-CM

## 2018-11-20 NOTE — Progress Notes (Signed)
Subjective: [name] is a 58 y.o. male patient seen today in office for POV #2 (DOS 10-24-18), S/P R 3rd toe tenotomy performed in office. Patient denies pain at surgical site, denies calf pain, denies headache, chest pain, shortness of breath, nausea, vomiting, fever, or chills. Patient states that he is doing well applying Iodosorb to the ulcerated areas as instructed with some drainage.  No other issues noted.   Fasting blood sugar today 159  Patient Active Problem List   Diagnosis Date Noted  . Laryngopharyngeal reflux (LPR) 02/07/2016  . GERD (gastroesophageal reflux disease) 12/27/2015  . Arthritis 11/16/2015  . Diabetes mellitus ([place]) 11/16/2015  . Hyperlipidemia 11/16/2015  . Localized edema 11/16/2015  . OBESITY 05/15/2007  . ADD 05/15/2007  . HYPERTENSION 05/15/2007  . Allergic rhinitis 05/15/2007  . SLEEP APNEA 05/15/2007  . COUGH 05/15/2007    Current Outpatient Medications on File Prior to Visit  Medication Sig Dispense Refill  . amLODipine (NORVASC) 5 MG tablet Take 5 mg by mouth daily.    Marland Kitchen amoxicillin-clavulanate (AUGMENTIN) 875-125 MG tablet Take 1 tablet by mouth 2 (two) times daily. 28 tablet 0  . aspirin EC 81 MG tablet Take 81 mg by mouth daily.    . BD INSULIN SYRINGE U/F 31G X 5/16" 1 ML MISC See admin instructions.    . Cetirizine HCl (ZYRTEC ALLERGY PO) Take 10 mg by mouth daily.    Marland Kitchen docusate sodium (COLACE) 100 MG capsule Take 1 capsule (100 mg total) by mouth 2 (two) times daily. 10 capsule 0  . glimepiride (AMARYL) 4 MG tablet Take 4 mg by mouth daily with breakfast.     . ibuprofen (ADVIL) 800 MG tablet Take 1 tablet (800 mg total) by mouth every 8 (eight) hours as needed. 30 tablet 0  . insulin NPH-regular Human (NOVOLIN 70/30) (70-30) 100 UNIT/ML injection Inject 40 Units into the skin 2 (two) times daily with a meal.    . metFORMIN (GLUCOPHAGE-XR) 500 MG 24 hr tablet Take 1,000 mg by mouth 2 (two) times daily.     . montelukast (SINGULAIR) 10 MG  tablet TAKE 1 TABLET BY MOUTH AT BEDTIME 30 tablet 3  . pantoprazole (PROTONIX) 40 MG tablet TAKE 1 TABLET BY MOUTH TWICE DAILY 60 tablet 3  . promethazine (PHENERGAN) 25 MG tablet Take 1 tablet (25 mg total) by mouth every 8 (eight) hours as needed for nausea or vomiting. 20 tablet 0  . ranitidine (ZANTAC) 150 MG tablet Take 1 tablet (150 mg total) by mouth 2 (two) times daily. 60 tablet 2  . silver nitrate applicators 40-98 % applicator Apply topically 3 (three) times a week. 100 each 0  . simvastatin (ZOCOR) 40 MG tablet TAKE 1 TABLET BY MOUTH ONCE DAILY IN THE EVENING FOR 30 DAYS  11  . Spacer/Aero-Holding Chambers (AEROCHAMBER MV) inhaler Use as instructed 1 each 0  . SSD 1 % cream APPLY CREAM TO AFFECTED AREA ONCE DAILY UNTIL BURN IS HEALED    . sulfamethoxazole-trimethoprim (BACTRIM) 400-80 MG tablet Take 1 tablet by mouth 2 (two) times daily. 28 tablet 0  . terbinafine (LAMISIL) 250 MG tablet Take 1 tablet (250 mg total) by mouth daily. 90 tablet 0   No current facility-administered medications on file prior to visit.     No Known Allergies  Objective: There were no vitals filed for this visit.  General: No acute distress, AAOx3  Focused foot exam: Surgical site healed plantar aspect of right third toe, previous burn to  plantar forefoot well-healed there is a pinpoint ulceration at right 3rd toe prematurely healed  and sub met 5 ulceration that is pinpoint in nature once debrided revealing a granular base that measures 1x1  cm with hyper-granular tissues with no signs of infection. + mild swelling to toes no drainage, no other signs of infection noted, Capillary fill time <3 seconds in all digits, protective sensation diminished right greater than left no pain or crepitation with range of motion right foot.  Third through fifth hammertoe deformity, no pain with calf compression.  Abrasions noted to the left toes without signs of infection. Ankle instability reported  bilateral.  Assessment and Plan:  Problem List Items Addressed This Visit      Endocrine   Diabetes mellitus ([place])    Other Visit Diagnoses    Foot ulcer, right, limited to breakdown of skin ([place])    -  Primary   Hammertoe of right foot       S/P foot surgery, right       Noncompliance           -Patient seen and evaluated -Applied Prisma and dry dressings to the right foot and instructed patient to do the same at home daily -Return to using cam boot -Patient to continue with ankle brace as tolerated on left -Will plan for wound check here in 2 weeks   Landis Martins, DPM

## 2018-12-05 ENCOUNTER — Encounter: Payer: Self-pay | Admitting: Sports Medicine

## 2018-12-05 ENCOUNTER — Other Ambulatory Visit: Payer: Self-pay

## 2018-12-05 ENCOUNTER — Ambulatory Visit: Payer: BC Managed Care – PPO | Admitting: Sports Medicine

## 2018-12-05 DIAGNOSIS — Z9889 Other specified postprocedural states: Secondary | ICD-10-CM

## 2018-12-05 DIAGNOSIS — Z91199 Patient's noncompliance with other medical treatment and regimen due to unspecified reason: Secondary | ICD-10-CM

## 2018-12-05 DIAGNOSIS — L97511 Non-pressure chronic ulcer of other part of right foot limited to breakdown of skin: Secondary | ICD-10-CM | POA: Diagnosis not present

## 2018-12-05 DIAGNOSIS — E1140 Type 2 diabetes mellitus with diabetic neuropathy, unspecified: Secondary | ICD-10-CM

## 2018-12-05 DIAGNOSIS — Z9119 Patient's noncompliance with other medical treatment and regimen: Secondary | ICD-10-CM

## 2018-12-05 NOTE — Progress Notes (Signed)
Subjective: [name] is a 58 y.o. male patient seen today in office for POV #3 (DOS 10-24-18), S/P R 3rd toe tenotomy performed in office. Patient reports that the right foot ulcer has been draining more after he went to play a round of golf and normal golf shoes and admits that he has not been wearing the boot constantly and does not when in the house he walks around barefoot on the right.  Patient denies pain at surgical site, denies calf pain, denies headache, chest pain, shortness of breath, nausea, vomiting, fever, or chills.  No other issues noted.   Fasting blood sugar today 161.   Patient Active Problem List   Diagnosis Date Noted  . Laryngopharyngeal reflux (LPR) 02/07/2016  . GERD (gastroesophageal reflux disease) 12/27/2015  . Arthritis 11/16/2015  . Diabetes mellitus ([place]) 11/16/2015  . Hyperlipidemia 11/16/2015  . Localized edema 11/16/2015  . OBESITY 05/15/2007  . ADD 05/15/2007  . HYPERTENSION 05/15/2007  . Allergic rhinitis 05/15/2007  . SLEEP APNEA 05/15/2007  . COUGH 05/15/2007    Current Outpatient Medications on File Prior to Visit  Medication Sig Dispense Refill  . amLODipine (NORVASC) 5 MG tablet Take 5 mg by mouth daily.    Marland Kitchen amoxicillin-clavulanate (AUGMENTIN) 875-125 MG tablet Take 1 tablet by mouth 2 (two) times daily. 28 tablet 0  . aspirin EC 81 MG tablet Take 81 mg by mouth daily.    . BD INSULIN SYRINGE U/F 31G X 5/16" 1 ML MISC See admin instructions.    . Cetirizine HCl (ZYRTEC ALLERGY PO) Take 10 mg by mouth daily.    Marland Kitchen docusate sodium (COLACE) 100 MG capsule Take 1 capsule (100 mg total) by mouth 2 (two) times daily. 10 capsule 0  . glimepiride (AMARYL) 4 MG tablet Take 4 mg by mouth daily with breakfast.     . ibuprofen (ADVIL) 800 MG tablet Take 1 tablet (800 mg total) by mouth every 8 (eight) hours as needed. 30 tablet 0  . insulin NPH-regular Human (NOVOLIN 70/30) (70-30) 100 UNIT/ML injection Inject 40 Units into the skin 2 (two) times daily  with a meal.    . metFORMIN (GLUCOPHAGE-XR) 500 MG 24 hr tablet Take 1,000 mg by mouth 2 (two) times daily.     . montelukast (SINGULAIR) 10 MG tablet TAKE 1 TABLET BY MOUTH AT BEDTIME 30 tablet 3  . pantoprazole (PROTONIX) 40 MG tablet TAKE 1 TABLET BY MOUTH TWICE DAILY 60 tablet 3  . promethazine (PHENERGAN) 25 MG tablet Take 1 tablet (25 mg total) by mouth every 8 (eight) hours as needed for nausea or vomiting. 20 tablet 0  . ranitidine (ZANTAC) 150 MG tablet Take 1 tablet (150 mg total) by mouth 2 (two) times daily. 60 tablet 2  . silver nitrate applicators 32-44 % applicator Apply topically 3 (three) times a week. 100 each 0  . simvastatin (ZOCOR) 40 MG tablet TAKE 1 TABLET BY MOUTH ONCE DAILY IN THE EVENING FOR 30 DAYS  11  . Spacer/Aero-Holding Chambers (AEROCHAMBER MV) inhaler Use as instructed 1 each 0  . SSD 1 % cream APPLY CREAM TO AFFECTED AREA ONCE DAILY UNTIL BURN IS HEALED    . sulfamethoxazole-trimethoprim (BACTRIM) 400-80 MG tablet Take 1 tablet by mouth 2 (two) times daily. 28 tablet 0  . terbinafine (LAMISIL) 250 MG tablet Take 1 tablet (250 mg total) by mouth daily. 90 tablet 0   No current facility-administered medications on file prior to visit.     No Known  Allergies  Objective: There were no vitals filed for this visit.  General: No acute distress, AAOx3  Focused foot exam:  Previous surgical sites well-healed, there is sub met 5 ulceration  measures 1x1  cm with hyper-granular tissues with no signs of infection. + mild swelling to toes no drainage, no other signs of infection noted, Capillary fill time <3 seconds in all digits, protective sensation diminished right greater than left no pain or crepitation with range of motion right foot.  Third through fifth hammertoe deformity, no pain with calf compression.  Abrasions noted to the left toes without signs of infection. Ankle instability reported bilateral.  Assessment and Plan:  Problem List Items Addressed This  Visit      Endocrine   Diabetes mellitus ([place])    Other Visit Diagnoses    Foot ulcer, right, limited to breakdown of skin ([place])    -  Primary   S/P foot surgery, right       Noncompliance          -Patient seen and evaluated -Applied Silver nitrate and dry dressings to the right foot and instructed patient to do the same at home daily consisting of the same -Advised patient to strictly use cam boot on right -Patient to continue with ankle brace as tolerated on left -Will plan for wound check here in 2 weeks or sooner if problems or issues arise  Landis Martins, DPM

## 2018-12-13 DIAGNOSIS — G4733 Obstructive sleep apnea (adult) (pediatric): Secondary | ICD-10-CM | POA: Diagnosis not present

## 2018-12-17 DIAGNOSIS — H3561 Retinal hemorrhage, right eye: Secondary | ICD-10-CM | POA: Diagnosis not present

## 2018-12-17 DIAGNOSIS — H2511 Age-related nuclear cataract, right eye: Secondary | ICD-10-CM | POA: Diagnosis not present

## 2018-12-17 DIAGNOSIS — E113412 Type 2 diabetes mellitus with severe nonproliferative diabetic retinopathy with macular edema, left eye: Secondary | ICD-10-CM | POA: Diagnosis not present

## 2018-12-17 DIAGNOSIS — E113411 Type 2 diabetes mellitus with severe nonproliferative diabetic retinopathy with macular edema, right eye: Secondary | ICD-10-CM | POA: Diagnosis not present

## 2018-12-19 ENCOUNTER — Encounter: Payer: Self-pay | Admitting: Sports Medicine

## 2018-12-19 ENCOUNTER — Ambulatory Visit: Payer: BC Managed Care – PPO | Admitting: Sports Medicine

## 2018-12-19 ENCOUNTER — Other Ambulatory Visit: Payer: Self-pay

## 2018-12-19 DIAGNOSIS — Z91199 Patient's noncompliance with other medical treatment and regimen due to unspecified reason: Secondary | ICD-10-CM

## 2018-12-19 DIAGNOSIS — L97511 Non-pressure chronic ulcer of other part of right foot limited to breakdown of skin: Secondary | ICD-10-CM | POA: Diagnosis not present

## 2018-12-19 DIAGNOSIS — E1140 Type 2 diabetes mellitus with diabetic neuropathy, unspecified: Secondary | ICD-10-CM

## 2018-12-19 DIAGNOSIS — Z9889 Other specified postprocedural states: Secondary | ICD-10-CM

## 2018-12-19 DIAGNOSIS — Z9119 Patient's noncompliance with other medical treatment and regimen: Secondary | ICD-10-CM

## 2018-12-19 NOTE — Progress Notes (Signed)
Subjective: [name] is a 58 y.o. male patient seen today in office for POV #4 (DOS 10-24-18), S/P R 3rd toe tenotomy performed in office. Patient reports that he feels like the area is getting a little better some of the bulging from the wound has decreased but there is the same amount of drainage has been using silver stick and has been using boot as I instructed but verbally admits that he will not be using the boot this weekend because he will be going to the beach.  Patient denies pain at surgical site, denies calf pain, denies headache, chest pain, shortness of breath, nausea, vomiting, fever, or chills.  No other issues noted.   Fasting blood sugar today 210.  Patient Active Problem List   Diagnosis Date Noted  . Laryngopharyngeal reflux (LPR) 02/07/2016  . GERD (gastroesophageal reflux disease) 12/27/2015  . Arthritis 11/16/2015  . Diabetes mellitus ([place]) 11/16/2015  . Hyperlipidemia 11/16/2015  . Localized edema 11/16/2015  . OBESITY 05/15/2007  . ADD 05/15/2007  . HYPERTENSION 05/15/2007  . Allergic rhinitis 05/15/2007  . SLEEP APNEA 05/15/2007  . COUGH 05/15/2007    Current Outpatient Medications on File Prior to Visit  Medication Sig Dispense Refill  . amLODipine (NORVASC) 5 MG tablet Take 5 mg by mouth daily.    Marland Kitchen amoxicillin-clavulanate (AUGMENTIN) 875-125 MG tablet Take 1 tablet by mouth 2 (two) times daily. 28 tablet 0  . aspirin EC 81 MG tablet Take 81 mg by mouth daily.    . BD INSULIN SYRINGE U/F 31G X 5/16" 1 ML MISC See admin instructions.    . Cetirizine HCl (ZYRTEC ALLERGY PO) Take 10 mg by mouth daily.    Marland Kitchen docusate sodium (COLACE) 100 MG capsule Take 1 capsule (100 mg total) by mouth 2 (two) times daily. 10 capsule 0  . glimepiride (AMARYL) 4 MG tablet Take 4 mg by mouth daily with breakfast.     . ibuprofen (ADVIL) 800 MG tablet Take 1 tablet (800 mg total) by mouth every 8 (eight) hours as needed. 30 tablet 0  . insulin NPH-regular Human (NOVOLIN 70/30)  (70-30) 100 UNIT/ML injection Inject 40 Units into the skin 2 (two) times daily with a meal.    . metFORMIN (GLUCOPHAGE-XR) 500 MG 24 hr tablet Take 1,000 mg by mouth 2 (two) times daily.     . montelukast (SINGULAIR) 10 MG tablet TAKE 1 TABLET BY MOUTH AT BEDTIME 30 tablet 3  . pantoprazole (PROTONIX) 40 MG tablet TAKE 1 TABLET BY MOUTH TWICE DAILY 60 tablet 3  . promethazine (PHENERGAN) 25 MG tablet Take 1 tablet (25 mg total) by mouth every 8 (eight) hours as needed for nausea or vomiting. 20 tablet 0  . ranitidine (ZANTAC) 150 MG tablet Take 1 tablet (150 mg total) by mouth 2 (two) times daily. 60 tablet 2  . silver nitrate applicators 07-62 % applicator Apply topically 3 (three) times a week. 100 each 0  . simvastatin (ZOCOR) 40 MG tablet TAKE 1 TABLET BY MOUTH ONCE DAILY IN THE EVENING FOR 30 DAYS  11  . Spacer/Aero-Holding Chambers (AEROCHAMBER MV) inhaler Use as instructed 1 each 0  . SSD 1 % cream APPLY CREAM TO AFFECTED AREA ONCE DAILY UNTIL BURN IS HEALED    . sulfamethoxazole-trimethoprim (BACTRIM) 400-80 MG tablet Take 1 tablet by mouth 2 (two) times daily. 28 tablet 0  . terbinafine (LAMISIL) 250 MG tablet Take 1 tablet (250 mg total) by mouth daily. 90 tablet 0   No  current facility-administered medications on file prior to visit.     No Known Allergies  Objective: There were no vitals filed for this visit.  General: No acute distress, AAOx3  Focused foot exam:  Previous surgical sites well-healed, there is sub met 5 ulceration  measures 0.6 x 0.9 cm with hyper-granular tissues that appears to be improving with application of silver nitrate with no signs of infection. + mild swelling to toes no drainage, no other signs of infection noted, Capillary fill time <3 seconds in all digits, protective sensation diminished right greater than left no pain or crepitation with range of motion right foot.  Third through fifth hammertoe deformity, no pain with calf compression.  Abrasions  noted to the left toes without signs of infection. Ankle instability reported bilateral.  Assessment and Plan:  Problem List Items Addressed This Visit      Endocrine   Diabetes mellitus ([place])    Other Visit Diagnoses    Foot ulcer, right, limited to breakdown of skin ([place])    -  Primary   S/P foot surgery, right       Noncompliance          -Patient seen and evaluated -Discussed with patient proper wound care and encourage compliance with wound care regimen to prevent slowing of wound healing or complications like worsening infection -Mechanically debrided ulceration plantar right forefoot using a sterile chisel blade.  Hemostasis was achieved with manual pressure.  Patient tolerated the debridement procedure well without need for anesthesia.  The debridement was performed through the level of the dermis to healthy bleeding granular margins with measurements post debridement as above. -Re-Applied Silver nitrate and dry dressings to the right foot and instructed patient to do the same at home daily consisting of the same -Advised patient to strictly use cam boot on right like before on right -Patient to continue with ankle brace as tolerated on left -Will plan for wound check here in 2 weeks or sooner if problems or issues arise  Landis Martins, DPM

## 2019-01-02 ENCOUNTER — Encounter: Payer: Self-pay | Admitting: Sports Medicine

## 2019-01-02 ENCOUNTER — Ambulatory Visit: Payer: BC Managed Care – PPO | Admitting: Sports Medicine

## 2019-01-02 ENCOUNTER — Other Ambulatory Visit: Payer: Self-pay

## 2019-01-02 DIAGNOSIS — E1140 Type 2 diabetes mellitus with diabetic neuropathy, unspecified: Secondary | ICD-10-CM | POA: Diagnosis not present

## 2019-01-02 DIAGNOSIS — L97511 Non-pressure chronic ulcer of other part of right foot limited to breakdown of skin: Secondary | ICD-10-CM

## 2019-01-02 DIAGNOSIS — Z9889 Other specified postprocedural states: Secondary | ICD-10-CM

## 2019-01-02 NOTE — Progress Notes (Signed)
Subjective: [name] is a 58 y.o. male patient seen today in office for POV #5 (DOS 10-24-18), S/P R 3rd toe tenotomy performed in office. Patient reports that he feels like the area is getting better and has not applied silver stick in several days.  Patient denies pain at surgical site, denies calf pain, denies headache, chest pain, shortness of breath, nausea, vomiting, fever, or chills.  No other issues noted.   Fasting blood sugar today 125.  Patient Active Problem List   Diagnosis Date Noted  . Laryngopharyngeal reflux (LPR) 02/07/2016  . GERD (gastroesophageal reflux disease) 12/27/2015  . Arthritis 11/16/2015  . Diabetes mellitus ([place]) 11/16/2015  . Hyperlipidemia 11/16/2015  . Localized edema 11/16/2015  . OBESITY 05/15/2007  . ADD 05/15/2007  . HYPERTENSION 05/15/2007  . Allergic rhinitis 05/15/2007  . SLEEP APNEA 05/15/2007  . COUGH 05/15/2007    Current Outpatient Medications on File Prior to Visit  Medication Sig Dispense Refill  . amLODipine (NORVASC) 5 MG tablet Take 5 mg by mouth daily.    Marland Kitchen amoxicillin-clavulanate (AUGMENTIN) 875-125 MG tablet Take 1 tablet by mouth 2 (two) times daily. 28 tablet 0  . aspirin EC 81 MG tablet Take 81 mg by mouth daily.    . BD INSULIN SYRINGE U/F 31G X 5/16" 1 ML MISC See admin instructions.    . Cetirizine HCl (ZYRTEC ALLERGY PO) Take 10 mg by mouth daily.    Marland Kitchen docusate sodium (COLACE) 100 MG capsule Take 1 capsule (100 mg total) by mouth 2 (two) times daily. 10 capsule 0  . glimepiride (AMARYL) 4 MG tablet Take 4 mg by mouth daily with breakfast.     . ibuprofen (ADVIL) 800 MG tablet Take 1 tablet (800 mg total) by mouth every 8 (eight) hours as needed. 30 tablet 0  . insulin NPH-regular Human (NOVOLIN 70/30) (70-30) 100 UNIT/ML injection Inject 40 Units into the skin 2 (two) times daily with a meal.    . metFORMIN (GLUCOPHAGE-XR) 500 MG 24 hr tablet Take 1,000 mg by mouth 2 (two) times daily.     . montelukast (SINGULAIR) 10  MG tablet TAKE 1 TABLET BY MOUTH AT BEDTIME 30 tablet 3  . pantoprazole (PROTONIX) 40 MG tablet TAKE 1 TABLET BY MOUTH TWICE DAILY 60 tablet 3  . promethazine (PHENERGAN) 25 MG tablet Take 1 tablet (25 mg total) by mouth every 8 (eight) hours as needed for nausea or vomiting. 20 tablet 0  . ranitidine (ZANTAC) 150 MG tablet Take 1 tablet (150 mg total) by mouth 2 (two) times daily. 60 tablet 2  . silver nitrate applicators 32-12 % applicator Apply topically 3 (three) times a week. 100 each 0  . simvastatin (ZOCOR) 40 MG tablet TAKE 1 TABLET BY MOUTH ONCE DAILY IN THE EVENING FOR 30 DAYS  11  . Spacer/Aero-Holding Chambers (AEROCHAMBER MV) inhaler Use as instructed 1 each 0  . SSD 1 % cream APPLY CREAM TO AFFECTED AREA ONCE DAILY UNTIL BURN IS HEALED    . sulfamethoxazole-trimethoprim (BACTRIM) 400-80 MG tablet Take 1 tablet by mouth 2 (two) times daily. 28 tablet 0  . terbinafine (LAMISIL) 250 MG tablet Take 1 tablet (250 mg total) by mouth daily. 90 tablet 0   No current facility-administered medications on file prior to visit.     No Known Allergies  Objective: There were no vitals filed for this visit.  General: No acute distress, AAOx3  Focused foot exam:  Previous surgical sites well-healed, there is sub met  5 ulceration  measures 0.3 x 0.6 cm with granular tissues that appears to be improving with application of silver nitrate with no signs of infection. + mild swelling to toes no drainage, no other signs of infection noted, Capillary fill time <3 seconds in all digits, protective sensation diminished right greater than left no pain or crepitation with range of motion right foot.  Third through fifth hammertoe deformity, no pain with calf compression.   Assessment and Plan:  Problem List Items Addressed This Visit      Endocrine   Diabetes mellitus ([place])    Other Visit Diagnoses    Foot ulcer, right, limited to breakdown of skin ([place])    -  Primary   S/P foot surgery, right           -Patient seen and evaluated -Discussed treatment options for right foot ulcer -Mechanically debrided ulceration plantar right forefoot using a sterile chisel blade.  Hemostasis was achieved with manual pressure.  Patient tolerated the debridement procedure well without need for anesthesia.  The debridement was performed through the level of the dermis to healthy bleeding granular margins with measurements post debridement as above. -Re-Applied Silver nitrate and dry dressings to the right foot and instructed patient to do the same at home daily consisting of the same one weekly -Advised patient to strictly use cam boot on right like before on right to assist with healing this wound -Will plan for wound check in 3 weeks or sooner if problems or issues arise  Landis Martins, DPM

## 2019-01-12 DIAGNOSIS — G4733 Obstructive sleep apnea (adult) (pediatric): Secondary | ICD-10-CM | POA: Diagnosis not present

## 2019-01-23 ENCOUNTER — Other Ambulatory Visit: Payer: Self-pay

## 2019-01-23 ENCOUNTER — Ambulatory Visit: Payer: BC Managed Care – PPO | Admitting: Sports Medicine

## 2019-01-23 ENCOUNTER — Encounter: Payer: Self-pay | Admitting: Sports Medicine

## 2019-01-23 DIAGNOSIS — L97511 Non-pressure chronic ulcer of other part of right foot limited to breakdown of skin: Secondary | ICD-10-CM | POA: Diagnosis not present

## 2019-01-23 DIAGNOSIS — E1140 Type 2 diabetes mellitus with diabetic neuropathy, unspecified: Secondary | ICD-10-CM

## 2019-01-23 DIAGNOSIS — Z9889 Other specified postprocedural states: Secondary | ICD-10-CM

## 2019-01-23 NOTE — Progress Notes (Signed)
Subjective: [name] is a 58 y.o. male patient seen today in office for POV #6 (DOS 10-24-18), S/P R 3rd toe tenotomy performed in office. Patient reports that he feels like the ulcer at the bottom of the foot is doing about the same has applied silver stick but has also forgotten to do it weekly.  Patient reports that he has been compliant with wearing the cam boot but he is getting tired of it because it is hurting his back. Patient denies pain at surgical site, denies calf pain, denies headache, chest pain, shortness of breath, nausea, vomiting, fever, or chills.  No other issues noted.   Fasting blood sugar today 163.  Patient Active Problem List   Diagnosis Date Noted  . Laryngopharyngeal reflux (LPR) 02/07/2016  . GERD (gastroesophageal reflux disease) 12/27/2015  . Arthritis 11/16/2015  . Diabetes mellitus ([name]) 11/16/2015  . Hyperlipidemia 11/16/2015  . Localized edema 11/16/2015  . OBESITY 05/15/2007  . ADD 05/15/2007  . HYPERTENSION 05/15/2007  . Allergic rhinitis 05/15/2007  . SLEEP APNEA 05/15/2007  . COUGH 05/15/2007    Current Outpatient Medications on File Prior to Visit  Medication Sig Dispense Refill  . amLODipine (NORVASC) 5 MG tablet Take 5 mg by mouth daily.    Marland Kitchen amoxicillin-clavulanate (AUGMENTIN) 875-125 MG tablet Take 1 tablet by mouth 2 (two) times daily. 28 tablet 0  . aspirin EC 81 MG tablet Take 81 mg by mouth daily.    . BD INSULIN SYRINGE U/F 31G X 5/16" 1 ML MISC See admin instructions.    . Cetirizine HCl (ZYRTEC ALLERGY PO) Take 10 mg by mouth daily.    Marland Kitchen docusate sodium (COLACE) 100 MG capsule Take 1 capsule (100 mg total) by mouth 2 (two) times daily. 10 capsule 0  . glimepiride (AMARYL) 4 MG tablet Take 4 mg by mouth daily with breakfast.     . ibuprofen (ADVIL) 800 MG tablet Take 1 tablet (800 mg total) by mouth every 8 (eight) hours as needed. 30 tablet 0  . insulin NPH-regular Human (NOVOLIN 70/30) (70-30) 100 UNIT/ML injection Inject 40  Units into the skin 2 (two) times daily with a meal.    . metFORMIN (GLUCOPHAGE-XR) 500 MG 24 hr tablet Take 1,000 mg by mouth 2 (two) times daily.     . montelukast (SINGULAIR) 10 MG tablet TAKE 1 TABLET BY MOUTH AT BEDTIME 30 tablet 3  . pantoprazole (PROTONIX) 40 MG tablet TAKE 1 TABLET BY MOUTH TWICE DAILY 60 tablet 3  . promethazine (PHENERGAN) 25 MG tablet Take 1 tablet (25 mg total) by mouth every 8 (eight) hours as needed for nausea or vomiting. 20 tablet 0  . ranitidine (ZANTAC) 150 MG tablet Take 1 tablet (150 mg total) by mouth 2 (two) times daily. 60 tablet 2  . silver nitrate applicators 57-84 % applicator Apply topically 3 (three) times a week. 100 each 0  . simvastatin (ZOCOR) 40 MG tablet TAKE 1 TABLET BY MOUTH ONCE DAILY IN THE EVENING FOR 30 DAYS  11  . Spacer/Aero-Holding Chambers (AEROCHAMBER MV) inhaler Use as instructed 1 each 0  . SSD 1 % cream APPLY CREAM TO AFFECTED AREA ONCE DAILY UNTIL BURN IS HEALED    . sulfamethoxazole-trimethoprim (BACTRIM) 400-80 MG tablet Take 1 tablet by mouth 2 (two) times daily. 28 tablet 0  . terbinafine (LAMISIL) 250 MG tablet Take 1 tablet (250 mg total) by mouth daily. 90 tablet 0   No current facility-administered medications on file prior to visit.  No Known Allergies  Objective: There were no vitals filed for this visit.  General: No acute distress, AAOx3  Focused foot exam:  Previous surgical sites well-healed, there is sub met 5 ulceration  measures 0.2 x 0.3 cm with granular tissues that appears to be improving with application of silver nitrate with no signs of infection. + mild swelling to toes no drainage, no other signs of infection noted, Capillary fill time <3 seconds in all digits, protective sensation diminished right greater than left no pain or crepitation with range of motion right foot.  Third through fifth hammertoe deformity, no pain with calf compression.   Assessment and Plan:  Problem List Items Addressed This  Visit      Endocrine   Diabetes mellitus ([place])    Other Visit Diagnoses    Foot ulcer, right, limited to breakdown of skin ([place])    -  Primary   S/P foot surgery, right          -Patient seen and evaluated -Discussed treatment options for right foot ulcer -Discussed with patient possibility of primary closure in office of right foot pinpoint ulceration patient is agreeable verbal consent obtained for primary closure wound plantar right foot.  After sterile prep using Betadine a local injection of 1% lidocaine plain and 0.5% Marcaine plain was administered in a local field block fashion to the plantar fifth metatarsophalangeal joint.  After anesthesia was confirmed then utilizing a 1 2 pickup and 4-0 nylon suture the wound was primarily closed and approximated.  Afterwards the area was then dressed with Betadine soaked gauze and dry dressing secured with Coban.  Advised patient to keep dressing clean dry and intact and may change on Sunday on a schedule of every other day consisting of the same.  Patient tolerated the primary closure procedure well and local anesthesia well without any complications.   -Patient was advised to continue with cam boot or supportive tennis shoe as tolerated -Will plan for wound check in 2-3 weeks or sooner if problems or issues arise  Landis Martins, DPM

## 2019-02-04 ENCOUNTER — Encounter: Payer: Self-pay | Admitting: Sports Medicine

## 2019-02-04 ENCOUNTER — Ambulatory Visit (INDEPENDENT_AMBULATORY_CARE_PROVIDER_SITE_OTHER): Payer: BC Managed Care – PPO | Admitting: Sports Medicine

## 2019-02-04 ENCOUNTER — Other Ambulatory Visit: Payer: Self-pay

## 2019-02-04 DIAGNOSIS — E1140 Type 2 diabetes mellitus with diabetic neuropathy, unspecified: Secondary | ICD-10-CM

## 2019-02-04 DIAGNOSIS — L97511 Non-pressure chronic ulcer of other part of right foot limited to breakdown of skin: Secondary | ICD-10-CM | POA: Diagnosis not present

## 2019-02-04 DIAGNOSIS — Z9889 Other specified postprocedural states: Secondary | ICD-10-CM

## 2019-02-04 NOTE — Progress Notes (Signed)
Subjective: [name] is a 58 y.o. male patient seen today in office for POV #7 (DOS 10-24-18), S/P R 3rd toe tenotomy performed in office. Patient reports that he has had bleeding ever since the stitches were placed last visit tried to use a tennis shoe but noticed that there is increase in bleeding so went back to the boot bleeding is about the same as when changing dressing at least every other day. Patient denies pain at surgical site, denies calf pain, denies headache, chest pain, shortness of breath, nausea, vomiting, fever, or chills.  No other issues noted.   Fasting blood sugar today 128.  Patient Active Problem List   Diagnosis Date Noted  . Laryngopharyngeal reflux (LPR) 02/07/2016  . GERD (gastroesophageal reflux disease) 12/27/2015  . Arthritis 11/16/2015  . Diabetes mellitus ([place]) 11/16/2015  . Hyperlipidemia 11/16/2015  . Localized edema 11/16/2015  . OBESITY 05/15/2007  . ADD 05/15/2007  . HYPERTENSION 05/15/2007  . Allergic rhinitis 05/15/2007  . SLEEP APNEA 05/15/2007  . COUGH 05/15/2007    Current Outpatient Medications on File Prior to Visit  Medication Sig Dispense Refill  . amLODipine (NORVASC) 5 MG tablet Take 5 mg by mouth daily.    Marland Kitchen amoxicillin-clavulanate (AUGMENTIN) 875-125 MG tablet Take 1 tablet by mouth 2 (two) times daily. 28 tablet 0  . aspirin EC 81 MG tablet Take 81 mg by mouth daily.    . BD INSULIN SYRINGE U/F 31G X 5/16" 1 ML MISC See admin instructions.    . Cetirizine HCl (ZYRTEC ALLERGY PO) Take 10 mg by mouth daily.    Marland Kitchen docusate sodium (COLACE) 100 MG capsule Take 1 capsule (100 mg total) by mouth 2 (two) times daily. 10 capsule 0  . glimepiride (AMARYL) 4 MG tablet Take 4 mg by mouth daily with breakfast.     . ibuprofen (ADVIL) 800 MG tablet Take 1 tablet (800 mg total) by mouth every 8 (eight) hours as needed. 30 tablet 0  . insulin NPH-regular Human (NOVOLIN 70/30) (70-30) 100 UNIT/ML injection Inject 40 Units into the skin 2 (two)  times daily with a meal.    . metFORMIN (GLUCOPHAGE-XR) 500 MG 24 hr tablet Take 1,000 mg by mouth 2 (two) times daily.     . montelukast (SINGULAIR) 10 MG tablet TAKE 1 TABLET BY MOUTH AT BEDTIME 30 tablet 3  . pantoprazole (PROTONIX) 40 MG tablet TAKE 1 TABLET BY MOUTH TWICE DAILY 60 tablet 3  . promethazine (PHENERGAN) 25 MG tablet Take 1 tablet (25 mg total) by mouth every 8 (eight) hours as needed for nausea or vomiting. 20 tablet 0  . ranitidine (ZANTAC) 150 MG tablet Take 1 tablet (150 mg total) by mouth 2 (two) times daily. 60 tablet 2  . silver nitrate applicators 53-97 % applicator Apply topically 3 (three) times a week. 100 each 0  . simvastatin (ZOCOR) 40 MG tablet TAKE 1 TABLET BY MOUTH ONCE DAILY IN THE EVENING FOR 30 DAYS  11  . Spacer/Aero-Holding Chambers (AEROCHAMBER MV) inhaler Use as instructed 1 each 0  . SSD 1 % cream APPLY CREAM TO AFFECTED AREA ONCE DAILY UNTIL BURN IS HEALED    . sulfamethoxazole-trimethoprim (BACTRIM) 400-80 MG tablet Take 1 tablet by mouth 2 (two) times daily. 28 tablet 0  . terbinafine (LAMISIL) 250 MG tablet Take 1 tablet (250 mg total) by mouth daily. 90 tablet 0   No current facility-administered medications on file prior to visit.    No Known Allergies  Objective:  There were no vitals filed for this visit.  General: No acute distress, AAOx3  Focused foot exam:  Previous surgical sites well-healed, there is sub met 5 ulceration  measures 0.2 x 0.1 cm with granular tissues with a few stitches that have broken loose otherwise the remaining stitches are holding, Capillary fill time <3 seconds in all digits, protective sensation diminished right greater than left no pain or crepitation with range of motion right foot.  Third through fifth hammertoe deformity, no pain with calf compression.   Assessment and Plan:  Problem List Items Addressed This Visit      Endocrine   Diabetes mellitus ([place])    Other Visit Diagnoses    Foot ulcer, right,  limited to breakdown of skin ([place])    -  Primary   S/P foot surgery, right          -Patient seen and evaluated -Re-Discussed treatment options for right foot ulcer that was closed by primary sutures at last visit -Applied Dermabond and Steri-Strips to the area and advised patient to replace the Steri-Strips are lifting up to help keep the skin edges in close approximation to help with healing of this wound -Patient to continue with cam boot as tolerated and may slowly transition back to tennis shoe if there is no bleeding -Will plan for wound check in 2-3 weeks or sooner if problems or issues arise  Landis Martins, DPM

## 2019-02-12 DIAGNOSIS — G4733 Obstructive sleep apnea (adult) (pediatric): Secondary | ICD-10-CM | POA: Diagnosis not present

## 2019-02-18 ENCOUNTER — Other Ambulatory Visit: Payer: Self-pay

## 2019-02-18 ENCOUNTER — Ambulatory Visit: Payer: BC Managed Care – PPO | Admitting: Sports Medicine

## 2019-02-18 ENCOUNTER — Encounter: Payer: Self-pay | Admitting: Sports Medicine

## 2019-02-18 DIAGNOSIS — L03119 Cellulitis of unspecified part of limb: Secondary | ICD-10-CM

## 2019-02-18 DIAGNOSIS — L97511 Non-pressure chronic ulcer of other part of right foot limited to breakdown of skin: Secondary | ICD-10-CM | POA: Diagnosis not present

## 2019-02-18 DIAGNOSIS — M86171 Other acute osteomyelitis, right ankle and foot: Secondary | ICD-10-CM

## 2019-02-18 DIAGNOSIS — L02619 Cutaneous abscess of unspecified foot: Secondary | ICD-10-CM

## 2019-02-18 DIAGNOSIS — E1140 Type 2 diabetes mellitus with diabetic neuropathy, unspecified: Secondary | ICD-10-CM

## 2019-02-18 NOTE — Progress Notes (Signed)
Subjective: [name] is a 59 y.o. male patient seen today in office for POV #8 (DOS 10-24-18), S/P R 3rd toe tenotomy performed in office. Patient reports that he is frustrated because its not healing and still bleeding, denies calf pain, denies headache, chest pain, shortness of breath, nausea, vomiting, fever, or chills.  No other issues noted.   Fasting blood sugar today 144.  Patient Active Problem List   Diagnosis Date Noted  . Laryngopharyngeal reflux (LPR) 02/07/2016  . GERD (gastroesophageal reflux disease) 12/27/2015  . Arthritis 11/16/2015  . Diabetes mellitus ([place]) 11/16/2015  . Hyperlipidemia 11/16/2015  . Localized edema 11/16/2015  . OBESITY 05/15/2007  . ADD 05/15/2007  . HYPERTENSION 05/15/2007  . Allergic rhinitis 05/15/2007  . SLEEP APNEA 05/15/2007  . COUGH 05/15/2007    Current Outpatient Medications on File Prior to Visit  Medication Sig Dispense Refill  . amLODipine (NORVASC) 5 MG tablet Take 5 mg by mouth daily.    Marland Kitchen amoxicillin-clavulanate (AUGMENTIN) 875-125 MG tablet Take 1 tablet by mouth 2 (two) times daily. 28 tablet 0  . aspirin EC 81 MG tablet Take 81 mg by mouth daily.    . BD INSULIN SYRINGE U/F 31G X 5/16" 1 ML MISC See admin instructions.    . Cetirizine HCl (ZYRTEC ALLERGY PO) Take 10 mg by mouth daily.    Marland Kitchen docusate sodium (COLACE) 100 MG capsule Take 1 capsule (100 mg total) by mouth 2 (two) times daily. 10 capsule 0  . glimepiride (AMARYL) 4 MG tablet Take 4 mg by mouth daily with breakfast.     . ibuprofen (ADVIL) 800 MG tablet Take 1 tablet (800 mg total) by mouth every 8 (eight) hours as needed. 30 tablet 0  . insulin NPH-regular Human (NOVOLIN 70/30) (70-30) 100 UNIT/ML injection Inject 40 Units into the skin 2 (two) times daily with a meal.    . metFORMIN (GLUCOPHAGE-XR) 500 MG 24 hr tablet Take 1,000 mg by mouth 2 (two) times daily.     . montelukast (SINGULAIR) 10 MG tablet TAKE 1 TABLET BY MOUTH AT BEDTIME 30 tablet 3  .  pantoprazole (PROTONIX) 40 MG tablet TAKE 1 TABLET BY MOUTH TWICE DAILY 60 tablet 3  . promethazine (PHENERGAN) 25 MG tablet Take 1 tablet (25 mg total) by mouth every 8 (eight) hours as needed for nausea or vomiting. 20 tablet 0  . ranitidine (ZANTAC) 150 MG tablet Take 1 tablet (150 mg total) by mouth 2 (two) times daily. 60 tablet 2  . silver nitrate applicators 43-32 % applicator Apply topically 3 (three) times a week. 100 each 0  . simvastatin (ZOCOR) 40 MG tablet TAKE 1 TABLET BY MOUTH ONCE DAILY IN THE EVENING FOR 30 DAYS  11  . Spacer/Aero-Holding Chambers (AEROCHAMBER MV) inhaler Use as instructed 1 each 0  . SSD 1 % cream APPLY CREAM TO AFFECTED AREA ONCE DAILY UNTIL BURN IS HEALED    . sulfamethoxazole-trimethoprim (BACTRIM) 400-80 MG tablet Take 1 tablet by mouth 2 (two) times daily. 28 tablet 0  . terbinafine (LAMISIL) 250 MG tablet Take 1 tablet (250 mg total) by mouth daily. 90 tablet 0   No current facility-administered medications on file prior to visit.    No Known Allergies  Objective: There were no vitals filed for this visit.  General: No acute distress, AAOx3  Focused foot exam:  Previous surgical sites well-healed, there is sub met 5 ulceration  Measures 0.8 x 1cm with granular tissues with a few stitches that  have broken loose with significant surrounding maceration wound appears larger than previous, Capillary fill time <3 seconds in all digits, protective sensation diminished right greater than left no pain or crepitation with range of motion right foot.  Third through fifth hammertoe deformity, no pain with calf compression.   Assessment and Plan:  Problem List Items Addressed This Visit      Endocrine   Diabetes mellitus ([place])    Other Visit Diagnoses    Foot ulcer, right, limited to breakdown of skin ([place])    -  Primary   Cellulitis and abscess of foot, except toes       Acute osteomyelitis of right ankle or foot ([place])          -Patient seen and  evaluated -Re-Discussed treatment options for right foot ulcer that was closed by primary sutures at last visit that has opened up and appears to be reulcerating -Mechanically debrided ulceration plantar aspect of the right foot to healthy bleeding granular borders down to the level of subcutaneous tissue using a sterile tissue nipper patient able to tolerate debridement without need for anesthesia.  Applied gauze packing and dry dressing and advised patient to do the same daily. -Wound culture obtained will call patient if there is any need for oral antibiotics. -Ordered MRI for further evaluation to rule out osteomyelitis or cellulitis with abscess at fifth metatarsal phalangeal joint due to the chronic nature of this ulceration not improved -Dispense wedge postop shoe to help with offloading ulceration since cam boot straps keep breaking -Will plan for wound check in 2 weeks or sooner if problems or issues arise  Landis Martins, DPM

## 2019-02-18 NOTE — Addendum Note (Signed)
Addended by: Dinah Beers on: 02/18/2019 03:09 PM   Modules accepted: Orders

## 2019-02-19 ENCOUNTER — Telehealth: Payer: Self-pay | Admitting: *Deleted

## 2019-02-19 ENCOUNTER — Telehealth: Payer: Self-pay

## 2019-02-19 DIAGNOSIS — Z01812 Encounter for preprocedural laboratory examination: Secondary | ICD-10-CM

## 2019-02-19 DIAGNOSIS — L02619 Cutaneous abscess of unspecified foot: Secondary | ICD-10-CM

## 2019-02-19 DIAGNOSIS — E1140 Type 2 diabetes mellitus with diabetic neuropathy, unspecified: Secondary | ICD-10-CM | POA: Diagnosis not present

## 2019-02-19 DIAGNOSIS — M86171 Other acute osteomyelitis, right ankle and foot: Secondary | ICD-10-CM

## 2019-02-19 DIAGNOSIS — L97511 Non-pressure chronic ulcer of other part of right foot limited to breakdown of skin: Secondary | ICD-10-CM | POA: Diagnosis not present

## 2019-02-19 DIAGNOSIS — L03119 Cellulitis of unspecified part of limb: Secondary | ICD-10-CM | POA: Diagnosis not present

## 2019-02-19 NOTE — Telephone Encounter (Signed)
Keep dry.  ?

## 2019-02-19 NOTE — Telephone Encounter (Signed)
Hey, patient needs a MRI. Misty Stanley

## 2019-02-19 NOTE — Telephone Encounter (Signed)
[employer] - MRI Right foot   Request Status: Authorized  Order ID: [id number]  Valid Dates: 02/19/2019 - 08/17/2019  Schedule at:  [facility] [address] [place] , [place] [address] Phone: [phone] Fax: NPI: [id number] TIN: [id number]

## 2019-02-19 NOTE — Telephone Encounter (Signed)
Pt would like to know if he is able to shower and get his foot wet? Please advice

## 2019-02-19 NOTE — Telephone Encounter (Signed)
Called and notified Pt to keep foot dry. Pt stated understanding

## 2019-02-19 NOTE — Telephone Encounter (Signed)
-----   Message from [name], [place] sent at 02/18/2019 11:59 AM EST ----- Regarding: MRI R R foot evaluate for abscess and r/o osteomyelitis at 5th MTPJ  Patient wants to know cost of MRI I told him that the imaging center can discuss it with him

## 2019-02-20 NOTE — Telephone Encounter (Addendum)
[facility] Antony Contras scheduled pt for MRI [id number] right foot on 02/24/2019, arrive at [facility] 8:00am for blood work, arrive imaging 8:45am for 9:00am imaging. Faxed to [facility]. I informed pt of 02/23/2018 [facility] appt. Pt request scheduling information and billing phone number for [facility] [phone] to be emailed.

## 2019-02-23 ENCOUNTER — Telehealth: Payer: Self-pay

## 2019-02-23 ENCOUNTER — Other Ambulatory Visit: Payer: Self-pay | Admitting: Sports Medicine

## 2019-02-23 LAB — WOUND CULTURE: Organism ID, Bacteria: NONE SEEN

## 2019-02-23 MED ORDER — SULFAMETHOXAZOLE-TRIMETHOPRIM 400-80 MG PO TABS: 1.0000 | ORAL_TABLET | Freq: Two times a day (BID) | ORAL | 0 refills | Status: DC

## 2019-02-23 NOTE — Telephone Encounter (Signed)
He can wait on MRI and take antibiotics 1st for 2 weeks and then if not better go for MRI  Dr. Kathie Rhodes

## 2019-02-23 NOTE — Telephone Encounter (Signed)
-----   Message from [name], [place] sent at 02/23/2019  7:04 AM EST ----- Will you let patient know that his would culture was + and I have sent Bactrim antibiotic to his pharmacy for him to pick up and take for 2 weeks.  Thanks Dr. Kathie Rhodes

## 2019-02-23 NOTE — Telephone Encounter (Signed)
Notified Pt of Dr's instructions to hold off MRI until abx is completed

## 2019-02-23 NOTE — Telephone Encounter (Signed)
Notified Pt of wound cx results and abx Rx that was sent to his pharmacy. Pt stated understanding.  Pt states he has an MRI appointment for tomorrow (1/12), pt states he was told an estimate of the cost would be aprox. $200 or more, Pt states he is very tight with money right now and would like to know if he really needs it or know further instructions. Please advice

## 2019-02-23 NOTE — Progress Notes (Signed)
Sent bactrim for + wound culture 

## 2019-03-04 ENCOUNTER — Encounter: Payer: Self-pay | Admitting: Sports Medicine

## 2019-03-04 ENCOUNTER — Other Ambulatory Visit: Payer: Self-pay

## 2019-03-04 ENCOUNTER — Ambulatory Visit: Payer: BC Managed Care – PPO | Admitting: Sports Medicine

## 2019-03-04 DIAGNOSIS — L03119 Cellulitis of unspecified part of limb: Secondary | ICD-10-CM

## 2019-03-04 DIAGNOSIS — E1140 Type 2 diabetes mellitus with diabetic neuropathy, unspecified: Secondary | ICD-10-CM | POA: Diagnosis not present

## 2019-03-04 DIAGNOSIS — L97512 Non-pressure chronic ulcer of other part of right foot with fat layer exposed: Secondary | ICD-10-CM | POA: Diagnosis not present

## 2019-03-04 DIAGNOSIS — M86171 Other acute osteomyelitis, right ankle and foot: Secondary | ICD-10-CM

## 2019-03-04 DIAGNOSIS — L02619 Cutaneous abscess of unspecified foot: Secondary | ICD-10-CM

## 2019-03-04 NOTE — Progress Notes (Signed)
Subjective: [name] is a 59 y.o. male patient seen today in office for POV # 9 (DOS 10-24-18), S/P R 3rd toe tenotomy performed in office.  There is no issue with his toes however there is still a chronic plantar fifth metatarsal head ulcer that is not healing per patient report patient reports that when he is on his foot too much the area still hurts the which shoes seems to help and seems like it is not healing patient has had trouble with keeping the dressings from getting wet and reports that he is still on antibiotics.  Patient has not went for MRI of the right foot due to cost and wants to further discuss it again at today's visit.  Patient denies calf pain, denies headache, chest pain, shortness of breath, nausea, vomiting, fever, or chills.  No other issues noted.   Fasting blood sugar today 145.  Patient Active Problem List   Diagnosis Date Noted  . Laryngopharyngeal reflux (LPR) 02/07/2016  . GERD (gastroesophageal reflux disease) 12/27/2015  . Arthritis 11/16/2015  . Diabetes mellitus ([place]) 11/16/2015  . Hyperlipidemia 11/16/2015  . Localized edema 11/16/2015  . OBESITY 05/15/2007  . ADD 05/15/2007  . HYPERTENSION 05/15/2007  . Allergic rhinitis 05/15/2007  . SLEEP APNEA 05/15/2007  . COUGH 05/15/2007    Current Outpatient Medications on File Prior to Visit  Medication Sig Dispense Refill  . amLODipine (NORVASC) 5 MG tablet Take 5 mg by mouth daily.    Marland Kitchen amoxicillin-clavulanate (AUGMENTIN) 875-125 MG tablet Take 1 tablet by mouth 2 (two) times daily. 28 tablet 0  . aspirin EC 81 MG tablet Take 81 mg by mouth daily.    . BD INSULIN SYRINGE U/F 31G X 5/16" 1 ML MISC See admin instructions.    . Cetirizine HCl (ZYRTEC ALLERGY PO) Take 10 mg by mouth daily.    Marland Kitchen docusate sodium (COLACE) 100 MG capsule Take 1 capsule (100 mg total) by mouth 2 (two) times daily. 10 capsule 0  . glimepiride (AMARYL) 4 MG tablet Take 4 mg by mouth daily with breakfast.     . ibuprofen (ADVIL)  800 MG tablet Take 1 tablet (800 mg total) by mouth every 8 (eight) hours as needed. 30 tablet 0  . insulin NPH-regular Human (NOVOLIN 70/30) (70-30) 100 UNIT/ML injection Inject 40 Units into the skin 2 (two) times daily with a meal.    . metFORMIN (GLUCOPHAGE-XR) 500 MG 24 hr tablet Take 1,000 mg by mouth 2 (two) times daily.     . montelukast (SINGULAIR) 10 MG tablet TAKE 1 TABLET BY MOUTH AT BEDTIME 30 tablet 3  . pantoprazole (PROTONIX) 40 MG tablet TAKE 1 TABLET BY MOUTH TWICE DAILY 60 tablet 3  . promethazine (PHENERGAN) 25 MG tablet Take 1 tablet (25 mg total) by mouth every 8 (eight) hours as needed for nausea or vomiting. 20 tablet 0  . ranitidine (ZANTAC) 150 MG tablet Take 1 tablet (150 mg total) by mouth 2 (two) times daily. 60 tablet 2  . silver nitrate applicators 63-33 % applicator Apply topically 3 (three) times a week. 100 each 0  . simvastatin (ZOCOR) 40 MG tablet TAKE 1 TABLET BY MOUTH ONCE DAILY IN THE EVENING FOR 30 DAYS  11  . Spacer/Aero-Holding Chambers (AEROCHAMBER MV) inhaler Use as instructed 1 each 0  . SSD 1 % cream APPLY CREAM TO AFFECTED AREA ONCE DAILY UNTIL BURN IS HEALED    . sulfamethoxazole-trimethoprim (BACTRIM) 400-80 MG tablet Take 1 tablet by mouth  2 (two) times daily. 28 tablet 0  . terbinafine (LAMISIL) 250 MG tablet Take 1 tablet (250 mg total) by mouth daily. 90 tablet 0   No current facility-administered medications on file prior to visit.    No Known Allergies  Objective: There were no vitals filed for this visit.  General: No acute distress, AAOx3  Focused foot exam:  Previous surgical sites well-healed, there is right sub met 5 ulceration  Measures 1.5 x 2 cm with granular tissues larger than previous with surrounding keratotic skin with underlying maceration, beefy granular tissue to the wound base with no purulence however the wound does probe 0.3 cm to soft tissue in range, Capillary fill time <3 seconds in all digits, protective sensation  diminished right greater than left no pain or crepitation with range of motion right foot.  Asymptomatic Third through fifth hammertoe deformity, no pain with calf compression.   Assessment and Plan:  Problem List Items Addressed This Visit      Endocrine   Diabetes mellitus ([place])    Other Visit Diagnoses    Foot ulcer, right, with fat layer exposed ([place])    -  Primary   Cellulitis and abscess of foot, except toes       Acute osteomyelitis of right ankle or foot ([place])          -Patient seen and evaluated -Re-Discussed treatment options for right foot ulcer  -Mechanically debrided ulceration plantar aspect of the right foot to healthy bleeding granular borders down to the level of subcutaneous tissue using a sterile tissue nipper patient able to tolerate debridement without need for anesthesia.  Applied gauze packing and dry dressing and advised patient to do the same daily. -Continue with antibiotics until completed -Patient to go for MRI to rule out osteomyelitis in the setting of chronic ulcer that appears to not be healing -Continue with wedge postop shoe to help with offloading ulceration  -Will plan for wound check in 2 weeks and discussion of MRI results or sooner if problems or issues arise  Landis Martins, DPM

## 2019-03-06 ENCOUNTER — Telehealth (INDEPENDENT_AMBULATORY_CARE_PROVIDER_SITE_OTHER): Payer: BC Managed Care – PPO | Admitting: Sports Medicine

## 2019-03-06 DIAGNOSIS — M86171 Other acute osteomyelitis, right ankle and foot: Secondary | ICD-10-CM | POA: Diagnosis not present

## 2019-03-06 DIAGNOSIS — Z01812 Encounter for preprocedural laboratory examination: Secondary | ICD-10-CM | POA: Diagnosis not present

## 2019-03-06 DIAGNOSIS — L03119 Cellulitis of unspecified part of limb: Secondary | ICD-10-CM | POA: Diagnosis not present

## 2019-03-06 DIAGNOSIS — L02619 Cutaneous abscess of unspecified foot: Secondary | ICD-10-CM | POA: Diagnosis not present

## 2019-03-06 DIAGNOSIS — L03115 Cellulitis of right lower limb: Secondary | ICD-10-CM | POA: Diagnosis not present

## 2019-03-06 NOTE — Telephone Encounter (Signed)
MRI results reviewed with patient. Expressed to patient that MRI is + for osteomyelitis at 5th metatarsal head and base of 5th toe. Patient elects for surgery which will involve removal of bone at base of 5th toe and metatarsal head on right. Patient will come by office on Monday to sign consent and to pick up surgery packet. Patient thanked me for calling to go over his results. -Dr. Marylene Land

## 2019-03-10 ENCOUNTER — Telehealth: Payer: Self-pay | Admitting: *Deleted

## 2019-03-10 NOTE — Telephone Encounter (Signed)
"  I'd like to schedule my surgery with Dr. Marylene Land."  Dr. Marylene Land does surgeries on Mondays.  She can do it on Monday, March 23, 2019.  "I'd like to do it on February 1, if possible."  She can do it on March 16, 2019.  I'll schedule it.  You will receive a call from someone from the surgical center a day or two prior to your surgery date and that person will give you your arrival time.

## 2019-03-10 NOTE — Telephone Encounter (Signed)
DOS 03/16/2019 REMOVAL OF BONE 5TH - 28113 AND WOUND DEBRIDEMENT 5TH - 41030  BCBS: Eligibility Date - 02/13/2019 - 02/11/2198   In-Network    Max Per Benefit Period Year-to-Date Remaining  CoInsurance  40%    Deductible  $0.00 $0.00  Out-Of-Pocket 3  $700.00 $645.50   In Network  Copay Coinsurance Authorization Required  Not Applicable  30% per Service Year  No  Messages: BDC [facility] liability   Not Applicable  40% per Service Year  No

## 2019-03-11 ENCOUNTER — Telehealth: Payer: Self-pay

## 2019-03-11 DIAGNOSIS — Z20828 Contact with and (suspected) exposure to other viral communicable diseases: Secondary | ICD-10-CM | POA: Diagnosis not present

## 2019-03-11 NOTE — Telephone Encounter (Signed)
Pt called stating he has Sx with Dr. Marylene Land Monday. Pt. States he is not feeling good, pt states he feels like he has a cold,  N&V and a headache. Pt. States he does not have a thermometer and is unable to check if he has a temperature or not.

## 2019-03-11 NOTE — Telephone Encounter (Signed)
Please contact patient re: feeling sick and see if we need to reschedule his surgery.

## 2019-03-11 NOTE — Telephone Encounter (Signed)
I am calling on behalf of Dr. Marylene Land.  She wanted me to call and see how you are doing.  "I feel like shit.  I went and did a Covid test.  I should know something in three days."  Would you like to reschedule your surgery?  "Let me see how I feel tomorrow.  If I don't feel any better I will let you know.  It may just be the flu."  Give me a call tomorrow and let me know.  "I will."

## 2019-03-12 ENCOUNTER — Other Ambulatory Visit: Payer: Self-pay

## 2019-03-12 ENCOUNTER — Encounter (HOSPITAL_COMMUNITY): Payer: Self-pay | Admitting: Emergency Medicine

## 2019-03-12 ENCOUNTER — Telehealth: Payer: Self-pay | Admitting: *Deleted

## 2019-03-12 ENCOUNTER — Emergency Department (HOSPITAL_COMMUNITY): Payer: BC Managed Care – PPO

## 2019-03-12 ENCOUNTER — Inpatient Hospital Stay (HOSPITAL_COMMUNITY)
Admission: EM | Admit: 2019-03-12 | Discharge: 2019-03-17 | DRG: 853 | Disposition: A | Discharge status: Home Health | Payer: BC Managed Care – PPO | Attending: Internal Medicine | Admitting: Internal Medicine

## 2019-03-12 DIAGNOSIS — G4733 Obstructive sleep apnea (adult) (pediatric): Secondary | ICD-10-CM | POA: Diagnosis not present

## 2019-03-12 DIAGNOSIS — E1165 Type 2 diabetes mellitus with hyperglycemia: Secondary | ICD-10-CM | POA: Diagnosis not present

## 2019-03-12 DIAGNOSIS — E119 Type 2 diabetes mellitus without complications: Secondary | ICD-10-CM | POA: Diagnosis not present

## 2019-03-12 DIAGNOSIS — E1169 Type 2 diabetes mellitus with other specified complication: Secondary | ICD-10-CM | POA: Diagnosis present

## 2019-03-12 DIAGNOSIS — E1110 Type 2 diabetes mellitus with ketoacidosis without coma: Secondary | ICD-10-CM | POA: Diagnosis present

## 2019-03-12 DIAGNOSIS — I10 Essential (primary) hypertension: Secondary | ICD-10-CM | POA: Diagnosis present

## 2019-03-12 DIAGNOSIS — L03115 Cellulitis of right lower limb: Secondary | ICD-10-CM

## 2019-03-12 DIAGNOSIS — M869 Osteomyelitis, unspecified: Secondary | ICD-10-CM | POA: Diagnosis not present

## 2019-03-12 DIAGNOSIS — R7881 Bacteremia: Secondary | ICD-10-CM | POA: Diagnosis not present

## 2019-03-12 DIAGNOSIS — E785 Hyperlipidemia, unspecified: Secondary | ICD-10-CM | POA: Diagnosis not present

## 2019-03-12 DIAGNOSIS — L97519 Non-pressure chronic ulcer of other part of right foot with unspecified severity: Secondary | ICD-10-CM | POA: Diagnosis not present

## 2019-03-12 DIAGNOSIS — R6520 Severe sepsis without septic shock: Secondary | ICD-10-CM | POA: Diagnosis present

## 2019-03-12 DIAGNOSIS — B9561 Methicillin susceptible Staphylococcus aureus infection as the cause of diseases classified elsewhere: Secondary | ICD-10-CM

## 2019-03-12 DIAGNOSIS — I248 Other forms of acute ischemic heart disease: Secondary | ICD-10-CM

## 2019-03-12 DIAGNOSIS — Z833 Family history of diabetes mellitus: Secondary | ICD-10-CM

## 2019-03-12 DIAGNOSIS — R0602 Shortness of breath: Secondary | ICD-10-CM | POA: Diagnosis not present

## 2019-03-12 DIAGNOSIS — Z79899 Other long term (current) drug therapy: Secondary | ICD-10-CM

## 2019-03-12 DIAGNOSIS — K219 Gastro-esophageal reflux disease without esophagitis: Secondary | ICD-10-CM | POA: Diagnosis present

## 2019-03-12 DIAGNOSIS — R05 Cough: Secondary | ICD-10-CM | POA: Diagnosis not present

## 2019-03-12 DIAGNOSIS — Z20822 Contact with and (suspected) exposure to covid-19: Secondary | ICD-10-CM | POA: Diagnosis present

## 2019-03-12 DIAGNOSIS — E11621 Type 2 diabetes mellitus with foot ulcer: Secondary | ICD-10-CM | POA: Diagnosis not present

## 2019-03-12 DIAGNOSIS — M86171 Other acute osteomyelitis, right ankle and foot: Secondary | ICD-10-CM

## 2019-03-12 DIAGNOSIS — Z1624 Resistance to multiple antibiotics: Secondary | ICD-10-CM | POA: Diagnosis not present

## 2019-03-12 DIAGNOSIS — Z955 Presence of coronary angioplasty implant and graft: Secondary | ICD-10-CM | POA: Diagnosis not present

## 2019-03-12 DIAGNOSIS — L97514 Non-pressure chronic ulcer of other part of right foot with necrosis of bone: Secondary | ICD-10-CM | POA: Diagnosis not present

## 2019-03-12 DIAGNOSIS — Z9889 Other specified postprocedural states: Secondary | ICD-10-CM

## 2019-03-12 DIAGNOSIS — R739 Hyperglycemia, unspecified: Secondary | ICD-10-CM

## 2019-03-12 DIAGNOSIS — Z89421 Acquired absence of other right toe(s): Secondary | ICD-10-CM | POA: Diagnosis not present

## 2019-03-12 DIAGNOSIS — R0902 Hypoxemia: Secondary | ICD-10-CM | POA: Diagnosis not present

## 2019-03-12 DIAGNOSIS — Z7982 Long term (current) use of aspirin: Secondary | ICD-10-CM | POA: Diagnosis not present

## 2019-03-12 DIAGNOSIS — Z8249 Family history of ischemic heart disease and other diseases of the circulatory system: Secondary | ICD-10-CM | POA: Diagnosis not present

## 2019-03-12 DIAGNOSIS — A4901 Methicillin susceptible Staphylococcus aureus infection, unspecified site: Secondary | ICD-10-CM | POA: Diagnosis not present

## 2019-03-12 DIAGNOSIS — I152 Hypertension secondary to endocrine disorders: Secondary | ICD-10-CM | POA: Diagnosis present

## 2019-03-12 DIAGNOSIS — G4489 Other headache syndrome: Secondary | ICD-10-CM | POA: Diagnosis not present

## 2019-03-12 DIAGNOSIS — B9620 Unspecified Escherichia coli [E. coli] as the cause of diseases classified elsewhere: Secondary | ICD-10-CM | POA: Diagnosis not present

## 2019-03-12 DIAGNOSIS — Z209 Contact with and (suspected) exposure to unspecified communicable disease: Secondary | ICD-10-CM | POA: Diagnosis not present

## 2019-03-12 DIAGNOSIS — I340 Nonrheumatic mitral (valve) insufficiency: Secondary | ICD-10-CM | POA: Diagnosis not present

## 2019-03-12 DIAGNOSIS — L02611 Cutaneous abscess of right foot: Secondary | ICD-10-CM | POA: Diagnosis not present

## 2019-03-12 DIAGNOSIS — N179 Acute kidney failure, unspecified: Secondary | ICD-10-CM | POA: Diagnosis not present

## 2019-03-12 DIAGNOSIS — R509 Fever, unspecified: Secondary | ICD-10-CM | POA: Diagnosis not present

## 2019-03-12 DIAGNOSIS — I252 Old myocardial infarction: Secondary | ICD-10-CM

## 2019-03-12 DIAGNOSIS — E1140 Type 2 diabetes mellitus with diabetic neuropathy, unspecified: Secondary | ICD-10-CM | POA: Diagnosis present

## 2019-03-12 DIAGNOSIS — A4101 Sepsis due to Methicillin susceptible Staphylococcus aureus: Secondary | ICD-10-CM | POA: Diagnosis not present

## 2019-03-12 DIAGNOSIS — Z6841 Body Mass Index (BMI) 40.0 and over, adult: Secondary | ICD-10-CM | POA: Diagnosis not present

## 2019-03-12 DIAGNOSIS — B954 Other streptococcus as the cause of diseases classified elsewhere: Secondary | ICD-10-CM | POA: Diagnosis not present

## 2019-03-12 DIAGNOSIS — E782 Mixed hyperlipidemia: Secondary | ICD-10-CM | POA: Diagnosis not present

## 2019-03-12 DIAGNOSIS — E669 Obesity, unspecified: Secondary | ICD-10-CM | POA: Diagnosis present

## 2019-03-12 DIAGNOSIS — A419 Sepsis, unspecified organism: Secondary | ICD-10-CM

## 2019-03-12 DIAGNOSIS — L97512 Non-pressure chronic ulcer of other part of right foot with fat layer exposed: Secondary | ICD-10-CM | POA: Diagnosis not present

## 2019-03-12 DIAGNOSIS — Z794 Long term (current) use of insulin: Secondary | ICD-10-CM

## 2019-03-12 HISTORY — DX: Sleep apnea, unspecified: G47.30

## 2019-03-12 LAB — CBC WITH DIFFERENTIAL/PLATELET
Abs Immature Granulocytes: 0.12 10*3/uL — ABNORMAL HIGH (ref 0.00–0.07)
Basophils Absolute: 0.1 10*3/uL (ref 0.0–0.1)
Basophils Relative: 1 %
Eosinophils Absolute: 0.1 10*3/uL (ref 0.0–0.5)
Eosinophils Relative: 1 %
HCT: 43.2 % (ref 39.0–52.0)
Hemoglobin: 14.5 g/dL (ref 13.0–17.0)
Immature Granulocytes: 1 %
Lymphocytes Relative: 3 %
Lymphs Abs: 0.4 10*3/uL — ABNORMAL LOW (ref 0.7–4.0)
MCH: 28.9 pg (ref 26.0–34.0)
MCHC: 33.6 g/dL (ref 30.0–36.0)
MCV: 86.2 fL (ref 80.0–100.0)
Monocytes Absolute: 1.2 10*3/uL — ABNORMAL HIGH (ref 0.1–1.0)
Monocytes Relative: 8 %
Neutro Abs: 13.6 10*3/uL — ABNORMAL HIGH (ref 1.7–7.7)
Neutrophils Relative %: 86 %
Platelets: 269 10*3/uL (ref 150–400)
RBC: 5.01 MIL/uL (ref 4.22–5.81)
RDW: 13 % (ref 11.5–15.5)
WBC: 15.5 10*3/uL — ABNORMAL HIGH (ref 4.0–10.5)
nRBC: 0 % (ref 0.0–0.2)

## 2019-03-12 LAB — COMPREHENSIVE METABOLIC PANEL
ALT: 24 U/L (ref 0–44)
AST: 30 U/L (ref 15–41)
Albumin: 3 g/dL — ABNORMAL LOW (ref 3.5–5.0)
Alkaline Phosphatase: 79 U/L (ref 38–126)
Anion gap: 15 (ref 5–15)
BUN: 30 mg/dL — ABNORMAL HIGH (ref 6–20)
CO2: 19 mmol/L — ABNORMAL LOW (ref 22–32)
Calcium: 8.8 mg/dL — ABNORMAL LOW (ref 8.9–10.3)
Chloride: 93 mmol/L — ABNORMAL LOW (ref 98–111)
Creatinine, Ser: 1.83 mg/dL — ABNORMAL HIGH (ref 0.61–1.24)
GFR calc Af Amer: 47 mL/min — ABNORMAL LOW (ref >60)
GFR calc non Af Amer: 40 mL/min — ABNORMAL LOW (ref >60)
Glucose, Bld: 356 mg/dL — ABNORMAL HIGH (ref 70–99)
Potassium: 4 mmol/L (ref 3.5–5.1)
Sodium: 127 mmol/L — ABNORMAL LOW (ref 135–145)
Total Bilirubin: 1.3 mg/dL — ABNORMAL HIGH (ref 0.3–1.2)
Total Protein: 7.2 g/dL (ref 6.5–8.1)

## 2019-03-12 MED ORDER — ACETAMINOPHEN 500 MG PO TABS
1000.0000 mg | ORAL_TABLET | Freq: Once | ORAL | Status: AC
  Administered 2019-03-13: 1000 mg via ORAL
  Filled 2019-03-12: qty 2

## 2019-03-12 MED ORDER — PIPERACILLIN-TAZOBACTAM 3.375 G IVPB 30 MIN
3.3750 g | Freq: Once | INTRAVENOUS | Status: AC
  Administered 2019-03-13: 01:00:00 3.375 g via INTRAVENOUS
  Filled 2019-03-12: qty 50

## 2019-03-12 MED ORDER — ACETAMINOPHEN 325 MG PO TABS
650.0000 mg | ORAL_TABLET | Freq: Once | ORAL | Status: AC
  Administered 2019-03-12: 23:00:00 650 mg via ORAL
  Filled 2019-03-12: qty 2

## 2019-03-12 MED ORDER — VANCOMYCIN HCL IN DEXTROSE 1-5 GM/200ML-% IV SOLN
1000.0000 mg | Freq: Once | INTRAVENOUS | Status: AC
  Administered 2019-03-13: 01:00:00 1000 mg via INTRAVENOUS
  Filled 2019-03-12: qty 200

## 2019-03-12 NOTE — ED Provider Notes (Signed)
TIME SEEN: 11:56 PM  CHIEF COMPLAINT: Fever, cough, shortness of breath, right foot redness  HPI: Patient is a 59 year old male with history of obesity, hypertension, diabetes, hyperlipidemia who presents to the emergency department with fever, cough, shortness of breath, generalized weakness and fatigue, nausea and vomiting that started yesterday.  Was tested for Covid yesterday and does not have his results back.  Also has had redness to the right foot today.  No injury.  States he is scheduled to have surgery on Monday for infection in his foot at [facility].  Reports most of his care is at [facility].  PCP is Dr. Marden Noble here in [place].  Last took Aleve this afternoon.  ROS: See HPI Constitutional:  fever  Eyes: no drainage  ENT: no runny nose   Cardiovascular:   chest pain  Resp: no SOB  GI: vomiting x 2 GU: no dysuria Integumentary: no rash  Allergy: no hives  Musculoskeletal: no leg swelling  Neurological: no slurred speech ROS otherwise negative  PAST MEDICAL HISTORY/PAST SURGICAL HISTORY:  Past Medical History:  Diagnosis Date  . Diabetes (HCC)   . Hyperlipidemia   . Hypertension   . NSTEMI (non-ST elevated myocardial infarction) (HCC)   . Obesity     MEDICATIONS:  Prior to Admission medications   Medication Sig Start Date End Date Taking? Authorizing Provider  amLODipine (NORVASC) 5 MG tablet Take 5 mg by mouth daily. 04/16/18   [provider]  amoxicillin-clavulanate (AUGMENTIN) 875-125 MG tablet Take 1 tablet by mouth 2 (two) times daily. 10/24/18   [name], DPM  aspirin EC 81 MG tablet Take 81 mg by mouth daily.    [provider]  BD INSULIN SYRINGE U/F 31G X 5/16" 1 ML MISC See admin instructions. 05/02/18   [provider]  Cetirizine HCl (ZYRTEC ALLERGY PO) Take 10 mg by mouth daily.    [provider]  docusate sodium (COLACE) 100 MG capsule Take 1 capsule (100 mg total) by mouth 2 (two) times  daily. 06/23/18   [name], DPM  glimepiride (AMARYL) 4 MG tablet Take 4 mg by mouth daily with breakfast.  10/01/15   [provider]  ibuprofen (ADVIL) 800 MG tablet Take 1 tablet (800 mg total) by mouth every 8 (eight) hours as needed. 06/23/18   [name], Cassandria Anger, DPM  insulin NPH-regular Human (NOVOLIN 70/30) (70-30) 100 UNIT/ML injection Inject 40 Units into the skin 2 (two) times daily with a meal.    [provider]  metFORMIN (GLUCOPHAGE-XR) 500 MG 24 hr tablet Take 1,000 mg by mouth 2 (two) times daily.  10/01/15   [provider]  montelukast (SINGULAIR) 10 MG tablet TAKE 1 TABLET BY MOUTH AT BEDTIME 01/14/17   Roslynn Amble, MD  pantoprazole (PROTONIX) 40 MG tablet TAKE 1 TABLET BY MOUTH TWICE DAILY 01/08/17   Roslynn Amble, MD  promethazine (PHENERGAN) 25 MG tablet Take 1 tablet (25 mg total) by mouth every 8 (eight) hours as needed for nausea or vomiting. 06/23/18   [name], DPM  ranitidine (ZANTAC) 150 MG tablet Take 1 tablet (150 mg total) by mouth 2 (two) times daily. 05/02/17   Bevelyn Ngo, NP  silver nitrate applicators 75-25 % applicator Apply topically 3 (three) times a week. 09/13/17   [name], DPM  simvastatin (ZOCOR) 40 MG tablet TAKE 1 TABLET BY MOUTH ONCE DAILY IN THE EVENING FOR 30 DAYS 11/14/17   [provider]  Spacer/Aero-Holding Chambers (AEROCHAMBER MV)  inhaler Use as instructed 08/29/16   Roslynn Amble, MD  SSD 1 % cream APPLY CREAM TO AFFECTED AREA ONCE DAILY UNTIL BURN IS HEALED 08/19/18   [provider]  sulfamethoxazole-trimethoprim (BACTRIM) 400-80 MG tablet Take 1 tablet by mouth 2 (two) times daily. 02/23/19   [name], DPM  terbinafine (LAMISIL) 250 MG tablet Take 1 tablet (250 mg total) by mouth daily. 12/24/17   [name], DPM    ALLERGIES:  No Known Allergies  SOCIAL HISTORY:  Social History   Tobacco Use  . Smoking status: Passive Smoke Exposure - Never Smoker  .  Smokeless tobacco: Never Used  . Tobacco comment: Both parents growing up.   Substance Use Topics  . Alcohol use: Yes    Comment: 4-5 on weekends    FAMILY HISTORY: Family History  Problem Relation Age of Onset  . Cancer Other   . Diabetes Other   . Heart disease Other   . Breast cancer Mother   . Heart disease Father   . Diabetes Father   . Leukemia Brother   . Asthma Maternal Uncle     EXAM: BP (!) 161/110 (BP Location: Left Arm)   Pulse (!) 123   Temp (!) 101.2 F (38.4 C) (Oral)   Resp (!) 28   SpO2 96%  CONSTITUTIONAL: Alert and oriented and responds appropriately to questions.  Obese HEAD: Normocephalic EYES: Conjunctivae clear, pupils appear equal, EOM appear intact ENT: normal nose; moist mucous membranes NECK: Supple, normal ROM CARD: Regular and tachycardic; S1 and S2 appreciated; no murmurs, no clicks, no rubs, no gallops RESP: Minimally tachypneic; breath sounds clear and equal bilaterally; no wheezes, no rhonchi, no rales, no hypoxia or respiratory distress, speaking full sentences ABD/GI: Normal bowel sounds; non-distended; soft, non-tender, no rebound, no guarding, no peritoneal signs, no hepatosplenomegaly BACK:  The back appears normal EXT: Normal ROM in all joints; no deformity noted, no edema; no cyanosis, 2+ DP pulses bilaterally SKIN: Normal color for age and race; warm; patient has area of redness and warmth to the lateral right foot without abscess, ulcerated lesion to the right lateral foot, drainage or bleeding, no induration or fluctuance NEURO: Moves all extremities equally PSYCH: The patient's mood and manner are appropriate.   MEDICAL DECISION MAKING: Patient here with concerns for COVID-19 infection.  Chest x-ray ordered from triage is clear.  He is not hypoxic or in respiratory distress but has mild tachypnea which may be due to his fever.  He is febrile here and tachycardic.  Will give Tylenol.  Also concerned that this could be from cellulitis  to the right foot.  States he recently had an MRI of this foot showing infection.  He is unable to tell me if this was osteomyelitis but states he is scheduled for surgery in 3 days at [facility].  Will attempt to obtain these records.  Labs, cultures, urine, Covid test pending.  Will give vancomycin and Zosyn for his cellulitis.  ED PROGRESS: 1:50 AM  Patient's troponins are mildly elevated.  EKG shows no ischemic change.  He denies any chest pain.  This could be demand ischemia in the setting of sepsis.  Lactate normal.  Blood pressures initially hypertensive but trending down but still normal.  Does have a leukocytosis of 15,000 with left shift.  Getting gentle IV hydration for acute kidney injury and hyperglycemia without DKA (anion gap normal, mildly decreased bicarb but this is in the setting of acute kidney injury and elevated BUN).  Given he is not hypotensive and has normal lactate, will be cautious with fluids given he could have COVID-19.  Respiratory panel is pending.  Urine is pending.  I did receive the MRI from [facility] that was completed on 03/06/2019.  MRI was done of the right foot with and without contrast.  He has osteomyelitis of the head of the fifth metatarsal, osteomyelitis of the proximal phalanx of the fifth toe, probable infectious synovitis of the PIP joint of the fifth toe.  States he is supposed to have surgery with Dr. Cannon Kettle, podiatrist on Monday.  2:40 AM  Pt's vital signs have significantly improved as his temperature has come down.  Covid and flu swabs are negative.  Will admit to medicine for sepsis secondary to cellulitis, AKI, demand ischemia.  3:05 AM Discussed patient's case with hospitalist, Dr. Criss Alvine.  I have recommended admission and patient (and family if present) agree with this plan. Admitting physician will place admission orders.   I reviewed all nursing notes, vitals, pertinent previous records and interpreted all EKGs, lab and urine results,  imaging (as available).     EKG Interpretation  Date/Time:  Thursday March 12 2019 22:29:04 EST Ventricular Rate:  132 PR Interval:  150 QRS Duration: 76 QT Interval:  296 QTC Calculation: 438 R Axis:   -9 Text Interpretation: Sinus tachycardia Low voltage QRS Possible Inferior infarct , age undetermined Cannot rule out Anterior infarct , age undetermined Abnormal ECG No significant change since last tracing other than rate is faster Confirmed by Pryor Curia [phone]) on 03/12/2019 11:56:44 PM        CRITICAL CARE Performed by: Cyril Mourning Yacoub Diltz   Total critical care time: 50 minutes  Critical care time was exclusive of separately billable procedures and treating other patients.  Critical care was necessary to treat or prevent imminent or life-threatening deterioration.  Critical care was time spent personally by me on the following activities: development of treatment plan with patient and/or surrogate as well as nursing, discussions with consultants, evaluation of patient's response to treatment, examination of patient, obtaining history from patient or surrogate, ordering and performing treatments and interventions, ordering and review of laboratory studies, ordering and review of radiographic studies, pulse oximetry and re-evaluation of patient's condition.   COLLIS THEDE was evaluated in Emergency Department on 03/12/2019 for the symptoms described in the history of present illness. He was evaluated in the context of the global COVID-19 pandemic, which necessitated consideration that the patient might be at risk for infection with the SARS-CoV-2 virus that causes COVID-19. Institutional protocols and algorithms that pertain to the evaluation of patients at risk for COVID-19 are in a state of rapid change based on information released by regulatory bodies including the CDC and federal and state organizations. These policies and algorithms were followed during the patient's care in  the ED.  Patient was seen wearing N95, face shield, gloves, gown.    Audi Wettstein, Delice Bison, DO 03/13/19 [phone]

## 2019-03-12 NOTE — Telephone Encounter (Signed)
FYI, I told the patient when he called the [place] office today that he has to be feeling better and his test results have to be negative in order for [place] to continue as planned with surgery. He will call the doctor on call (Dr. Allena Katz) over the weekend to let us know what his covid results are and to let us know how he is doing. -Dr. Marylene Land

## 2019-03-12 NOTE — Telephone Encounter (Signed)
"  Please see email"  Jeanelle Malling   [facility] Physician Office Liaison, Business Office  ---------------------------------------------------------------------------------  From: Margaretann Loveless [email or web address]> Sent: Thursday, March 12, 2019 3:36 PM To: Jeanelle Malling [email or web address]> Subject: [name] 2/1    [name] ([place]) Sex: Male, Date of Birth: [birth year], Age: 59 [facility] [address]) Medical Record # [id number] Procedure: RIGHT FOOT REMOVAL OF INFECTED BONE AT [facility] TOE JOINT WITH DEBRIDMENT OF ULCER on 03/16/2019 Physician: [name]  [address] [place], [place] [address] Mobile: [phone]   Patient started with covid like symptoms yesterday 1/27 and had a covid test. Results won't be back till 1/30 he says. He needs to reschedule till he is feeling 100%. Can you let the office know?  Thanks,   Kellie Moor RN [facility] Pre Anesthesia Assessment Nurse ----------------------------------------------------------------------------------------------------------------  I am calling to see how you're doing.  "I feel much better than I did yesterday."  You went and had a Covid test done, correct?  "Yes, I did.  I should get the results on January 30."  So you want to reschedule your surgery correct?  "No, I want to see what my results are going to say.  They told me to call the on-call pager on Saturday to let them know of the results.  So I will cancel then if the results are positive."  Okay, please let us know.

## 2019-03-12 NOTE — ED Triage Notes (Signed)
Patient arrived with EMS from home reports SOB , fever. chills, productive cough , fatigue and generalized weakness onset yesterday . CBG= 310 .

## 2019-03-13 ENCOUNTER — Encounter (HOSPITAL_COMMUNITY): Payer: Self-pay | Admitting: Emergency Medicine

## 2019-03-13 DIAGNOSIS — A419 Sepsis, unspecified organism: Secondary | ICD-10-CM | POA: Diagnosis not present

## 2019-03-13 DIAGNOSIS — Z8249 Family history of ischemic heart disease and other diseases of the circulatory system: Secondary | ICD-10-CM | POA: Diagnosis not present

## 2019-03-13 DIAGNOSIS — M86171 Other acute osteomyelitis, right ankle and foot: Secondary | ICD-10-CM | POA: Diagnosis not present

## 2019-03-13 DIAGNOSIS — E785 Hyperlipidemia, unspecified: Secondary | ICD-10-CM | POA: Diagnosis present

## 2019-03-13 DIAGNOSIS — R739 Hyperglycemia, unspecified: Secondary | ICD-10-CM

## 2019-03-13 DIAGNOSIS — L02611 Cutaneous abscess of right foot: Secondary | ICD-10-CM | POA: Diagnosis not present

## 2019-03-13 DIAGNOSIS — E1110 Type 2 diabetes mellitus with ketoacidosis without coma: Secondary | ICD-10-CM | POA: Diagnosis present

## 2019-03-13 DIAGNOSIS — I2489 Other forms of acute ischemic heart disease: Secondary | ICD-10-CM | POA: Insufficient documentation

## 2019-03-13 DIAGNOSIS — E1169 Type 2 diabetes mellitus with other specified complication: Secondary | ICD-10-CM | POA: Diagnosis present

## 2019-03-13 DIAGNOSIS — R6520 Severe sepsis without septic shock: Secondary | ICD-10-CM | POA: Diagnosis present

## 2019-03-13 DIAGNOSIS — Z833 Family history of diabetes mellitus: Secondary | ICD-10-CM | POA: Diagnosis not present

## 2019-03-13 DIAGNOSIS — E119 Type 2 diabetes mellitus without complications: Secondary | ICD-10-CM

## 2019-03-13 DIAGNOSIS — I252 Old myocardial infarction: Secondary | ICD-10-CM | POA: Diagnosis not present

## 2019-03-13 DIAGNOSIS — N179 Acute kidney failure, unspecified: Secondary | ICD-10-CM | POA: Diagnosis not present

## 2019-03-13 DIAGNOSIS — L03115 Cellulitis of right lower limb: Secondary | ICD-10-CM | POA: Diagnosis present

## 2019-03-13 DIAGNOSIS — B9561 Methicillin susceptible Staphylococcus aureus infection as the cause of diseases classified elsewhere: Secondary | ICD-10-CM | POA: Diagnosis not present

## 2019-03-13 DIAGNOSIS — Z794 Long term (current) use of insulin: Secondary | ICD-10-CM | POA: Diagnosis not present

## 2019-03-13 DIAGNOSIS — L97519 Non-pressure chronic ulcer of other part of right foot with unspecified severity: Secondary | ICD-10-CM | POA: Diagnosis not present

## 2019-03-13 DIAGNOSIS — E782 Mixed hyperlipidemia: Secondary | ICD-10-CM

## 2019-03-13 DIAGNOSIS — R7881 Bacteremia: Secondary | ICD-10-CM | POA: Diagnosis not present

## 2019-03-13 DIAGNOSIS — Z955 Presence of coronary angioplasty implant and graft: Secondary | ICD-10-CM | POA: Diagnosis not present

## 2019-03-13 DIAGNOSIS — G4733 Obstructive sleep apnea (adult) (pediatric): Secondary | ICD-10-CM | POA: Diagnosis present

## 2019-03-13 DIAGNOSIS — I248 Other forms of acute ischemic heart disease: Secondary | ICD-10-CM

## 2019-03-13 DIAGNOSIS — Z79899 Other long term (current) drug therapy: Secondary | ICD-10-CM | POA: Diagnosis not present

## 2019-03-13 DIAGNOSIS — Z20822 Contact with and (suspected) exposure to covid-19: Secondary | ICD-10-CM | POA: Diagnosis present

## 2019-03-13 DIAGNOSIS — M869 Osteomyelitis, unspecified: Secondary | ICD-10-CM | POA: Diagnosis present

## 2019-03-13 DIAGNOSIS — R509 Fever, unspecified: Secondary | ICD-10-CM | POA: Diagnosis present

## 2019-03-13 DIAGNOSIS — Z6841 Body Mass Index (BMI) 40.0 and over, adult: Secondary | ICD-10-CM | POA: Diagnosis not present

## 2019-03-13 DIAGNOSIS — K219 Gastro-esophageal reflux disease without esophagitis: Secondary | ICD-10-CM | POA: Diagnosis present

## 2019-03-13 DIAGNOSIS — I340 Nonrheumatic mitral (valve) insufficiency: Secondary | ICD-10-CM | POA: Diagnosis not present

## 2019-03-13 DIAGNOSIS — I10 Essential (primary) hypertension: Secondary | ICD-10-CM | POA: Diagnosis present

## 2019-03-13 DIAGNOSIS — E11621 Type 2 diabetes mellitus with foot ulcer: Secondary | ICD-10-CM | POA: Diagnosis present

## 2019-03-13 DIAGNOSIS — L97512 Non-pressure chronic ulcer of other part of right foot with fat layer exposed: Secondary | ICD-10-CM | POA: Diagnosis not present

## 2019-03-13 DIAGNOSIS — A4101 Sepsis due to Methicillin susceptible Staphylococcus aureus: Secondary | ICD-10-CM | POA: Diagnosis present

## 2019-03-13 DIAGNOSIS — E1140 Type 2 diabetes mellitus with diabetic neuropathy, unspecified: Secondary | ICD-10-CM | POA: Diagnosis not present

## 2019-03-13 DIAGNOSIS — Z7982 Long term (current) use of aspirin: Secondary | ICD-10-CM | POA: Diagnosis not present

## 2019-03-13 LAB — COMPREHENSIVE METABOLIC PANEL
ALT: 25 U/L (ref 0–44)
AST: 32 U/L (ref 15–41)
Albumin: 2.7 g/dL — ABNORMAL LOW (ref 3.5–5.0)
Alkaline Phosphatase: 82 U/L (ref 38–126)
Anion gap: 11 (ref 5–15)
BUN: 29 mg/dL — ABNORMAL HIGH (ref 6–20)
CO2: 24 mmol/L (ref 22–32)
Calcium: 8.6 mg/dL — ABNORMAL LOW (ref 8.9–10.3)
Chloride: 96 mmol/L — ABNORMAL LOW (ref 98–111)
Creatinine, Ser: 1.96 mg/dL — ABNORMAL HIGH (ref 0.61–1.24)
GFR calc Af Amer: 43 mL/min — ABNORMAL LOW (ref >60)
GFR calc non Af Amer: 37 mL/min — ABNORMAL LOW (ref >60)
Glucose, Bld: 320 mg/dL — ABNORMAL HIGH (ref 70–99)
Potassium: 3.6 mmol/L (ref 3.5–5.1)
Sodium: 131 mmol/L — ABNORMAL LOW (ref 135–145)
Total Bilirubin: 1.3 mg/dL — ABNORMAL HIGH (ref 0.3–1.2)
Total Protein: 6.9 g/dL (ref 6.5–8.1)

## 2019-03-13 LAB — URINALYSIS, ROUTINE W REFLEX MICROSCOPIC
Bilirubin Urine: NEGATIVE
Glucose, UA: >=500 mg/dL — AB
Ketones, ur: 5 mg/dL — AB
Leukocytes,Ua: NEGATIVE
Nitrite: NEGATIVE
Protein, ur: 100 mg/dL — AB
Specific Gravity, Urine: 1.021 (ref 1.005–1.030)
pH: 5 (ref 5.0–8.0)

## 2019-03-13 LAB — TROPONIN I (HIGH SENSITIVITY)
Troponin I (High Sensitivity): 56 ng/L — ABNORMAL HIGH (ref <18)
Troponin I (High Sensitivity): 81 ng/L — ABNORMAL HIGH (ref <18)

## 2019-03-13 LAB — BLOOD CULTURE ID PANEL (REFLEXED)

## 2019-03-13 LAB — CBC
HCT: 42.3 % (ref 39.0–52.0)
Hemoglobin: 14.1 g/dL (ref 13.0–17.0)
MCH: 28.8 pg (ref 26.0–34.0)
MCHC: 33.3 g/dL (ref 30.0–36.0)
MCV: 86.3 fL (ref 80.0–100.0)
Platelets: 246 10*3/uL (ref 150–400)
RBC: 4.9 MIL/uL (ref 4.22–5.81)
RDW: 13.1 % (ref 11.5–15.5)
WBC: 10.2 10*3/uL (ref 4.0–10.5)
nRBC: 0 % (ref 0.0–0.2)

## 2019-03-13 LAB — CBG MONITORING, ED: Glucose-Capillary: 334 mg/dL — ABNORMAL HIGH (ref 70–99)

## 2019-03-13 LAB — GLUCOSE, CAPILLARY
Glucose-Capillary: 129 mg/dL — ABNORMAL HIGH (ref 70–99)
Glucose-Capillary: 145 mg/dL — ABNORMAL HIGH (ref 70–99)
Glucose-Capillary: 189 mg/dL — ABNORMAL HIGH (ref 70–99)
Glucose-Capillary: 227 mg/dL — ABNORMAL HIGH (ref 70–99)
Glucose-Capillary: 234 mg/dL — ABNORMAL HIGH (ref 70–99)

## 2019-03-13 LAB — HEMOGLOBIN A1C
Hgb A1c MFr Bld: 8.8 % — ABNORMAL HIGH (ref 4.8–5.6)
Mean Plasma Glucose: 205.86 mg/dL

## 2019-03-13 LAB — URINE CULTURE: Culture: NO GROWTH

## 2019-03-13 LAB — LACTIC ACID, PLASMA: Lactic Acid, Venous: 1.7 mmol/L (ref 0.5–1.9)

## 2019-03-13 LAB — RESPIRATORY PANEL BY RT PCR (FLU A&B, COVID)
Influenza A by PCR: NEGATIVE
Influenza B by PCR: NEGATIVE
SARS Coronavirus 2 by RT PCR: NEGATIVE

## 2019-03-13 LAB — PROTIME-INR
INR: 1 (ref 0.8–1.2)
Prothrombin Time: 13.5 seconds (ref 11.4–15.2)

## 2019-03-13 LAB — MAGNESIUM: Magnesium: 1.9 mg/dL (ref 1.7–2.4)

## 2019-03-13 LAB — PROCALCITONIN: Procalcitonin: 3.07 ng/mL

## 2019-03-13 LAB — HIV ANTIBODY (ROUTINE TESTING W REFLEX): HIV Screen 4th Generation wRfx: NONREACTIVE

## 2019-03-13 LAB — CORTISOL-AM, BLOOD: Cortisol - AM: 22.1 ug/dL (ref 6.7–22.6)

## 2019-03-13 MED ORDER — SODIUM CHLORIDE 0.9 % IV SOLN: 250.0000 mL | INTRAVENOUS | Status: DC | PRN

## 2019-03-13 MED ORDER — VITAMIN D 25 MCG (1000 UNIT) PO TABS
1000.0000 [IU] | ORAL_TABLET | Freq: Every day | ORAL | Status: DC
  Administered 2019-03-13 – 2019-03-17 (×5): 1000 [IU] via ORAL
  Filled 2019-03-13 (×5): qty 1

## 2019-03-13 MED ORDER — INSULIN ASPART 100 UNIT/ML ~~LOC~~ SOLN
0.0000 [IU] | SUBCUTANEOUS | Status: DC
  Administered 2019-03-13: 21:00:00 7 [IU] via SUBCUTANEOUS
  Administered 2019-03-13: 12:00:00 4 [IU] via SUBCUTANEOUS
  Administered 2019-03-13: 7 [IU] via SUBCUTANEOUS
  Administered 2019-03-13: 15 [IU] via SUBCUTANEOUS
  Administered 2019-03-13 – 2019-03-14 (×4): 3 [IU] via SUBCUTANEOUS

## 2019-03-13 MED ORDER — SODIUM CHLORIDE 0.9 % IV BOLUS (SEPSIS)
1000.0000 mL | Freq: Once | INTRAVENOUS | Status: AC
  Administered 2019-03-13: 01:00:00 1000 mL via INTRAVENOUS

## 2019-03-13 MED ORDER — SIMVASTATIN 20 MG PO TABS
40.0000 mg | ORAL_TABLET | Freq: Every evening | ORAL | Status: DC
  Administered 2019-03-13 – 2019-03-16 (×4): 40 mg via ORAL
  Filled 2019-03-13 (×4): qty 2

## 2019-03-13 MED ORDER — CHLORHEXIDINE GLUCONATE CLOTH 2 % EX PADS
6.0000 | MEDICATED_PAD | Freq: Once | CUTANEOUS | Status: AC
  Administered 2019-03-14: 07:00:00 6 via TOPICAL

## 2019-03-13 MED ORDER — PANTOPRAZOLE SODIUM 40 MG PO TBEC
40.0000 mg | DELAYED_RELEASE_TABLET | Freq: Two times a day (BID) | ORAL | Status: DC
  Administered 2019-03-13 – 2019-03-17 (×9): 40 mg via ORAL
  Filled 2019-03-13 (×9): qty 1

## 2019-03-13 MED ORDER — ACETAMINOPHEN 325 MG PO TABS
650.0000 mg | ORAL_TABLET | Freq: Four times a day (QID) | ORAL | Status: DC | PRN
  Administered 2019-03-13 – 2019-03-15 (×5): 650 mg via ORAL
  Filled 2019-03-13 (×5): qty 2

## 2019-03-13 MED ORDER — ONDANSETRON HCL 4 MG PO TABS: 4.0000 mg | ORAL_TABLET | Freq: Four times a day (QID) | ORAL | Status: DC | PRN

## 2019-03-13 MED ORDER — ASPIRIN EC 81 MG PO TBEC
81.0000 mg | DELAYED_RELEASE_TABLET | Freq: Every day | ORAL | Status: DC
  Administered 2019-03-13 – 2019-03-17 (×5): 81 mg via ORAL
  Filled 2019-03-13 (×5): qty 1

## 2019-03-13 MED ORDER — INSULIN GLARGINE 100 UNIT/ML ~~LOC~~ SOLN
40.0000 [IU] | Freq: Once | SUBCUTANEOUS | Status: AC
  Administered 2019-03-13: 06:00:00 40 [IU] via SUBCUTANEOUS
  Filled 2019-03-13: qty 0.4

## 2019-03-13 MED ORDER — SODIUM CHLORIDE 0.9% FLUSH
3.0000 mL | Freq: Two times a day (BID) | INTRAVENOUS | Status: DC
  Administered 2019-03-13 – 2019-03-16 (×5): 3 mL via INTRAVENOUS

## 2019-03-13 MED ORDER — VANCOMYCIN HCL IN DEXTROSE 1-5 GM/200ML-% IV SOLN
1000.0000 mg | Freq: Two times a day (BID) | INTRAVENOUS | Status: DC
  Administered 2019-03-13: 17:00:00 1000 mg via INTRAVENOUS
  Filled 2019-03-13 (×2): qty 200

## 2019-03-13 MED ORDER — PIPERACILLIN-TAZOBACTAM 3.375 G IVPB
3.3750 g | Freq: Three times a day (TID) | INTRAVENOUS | Status: DC
  Administered 2019-03-13 – 2019-03-16 (×9): 3.375 g via INTRAVENOUS
  Filled 2019-03-13 (×11): qty 50

## 2019-03-13 MED ORDER — ONDANSETRON HCL 4 MG/2ML IJ SOLN: 4.0000 mg | Freq: Four times a day (QID) | INTRAMUSCULAR | Status: DC | PRN

## 2019-03-13 MED ORDER — ACETAMINOPHEN 650 MG RE SUPP: 650.0000 mg | Freq: Four times a day (QID) | RECTAL | Status: DC | PRN

## 2019-03-13 MED ORDER — MONTELUKAST SODIUM 10 MG PO TABS
10.0000 mg | ORAL_TABLET | Freq: Every day | ORAL | Status: DC
  Administered 2019-03-13 – 2019-03-16 (×4): 10 mg via ORAL
  Filled 2019-03-13 (×4): qty 1

## 2019-03-13 MED ORDER — HYDRALAZINE HCL 20 MG/ML IJ SOLN: 5.0000 mg | INTRAMUSCULAR | Status: DC | PRN

## 2019-03-13 MED ORDER — ASPIRIN 81 MG PO CHEW
324.0000 mg | CHEWABLE_TABLET | Freq: Once | ORAL | Status: AC
  Administered 2019-03-13: 02:00:00 324 mg via ORAL
  Filled 2019-03-13: qty 4

## 2019-03-13 MED ORDER — SODIUM CHLORIDE 0.9% FLUSH: 3.0000 mL | INTRAVENOUS | Status: DC | PRN

## 2019-03-13 MED ORDER — SODIUM CHLORIDE 0.9 % IV SOLN: INTRAVENOUS | Status: DC

## 2019-03-13 MED ORDER — VANCOMYCIN HCL IN DEXTROSE 1-5 GM/200ML-% IV SOLN
1000.0000 mg | Freq: Once | INTRAVENOUS | Status: AC
  Administered 2019-03-13: 06:00:00 1000 mg via INTRAVENOUS
  Filled 2019-03-13: qty 200

## 2019-03-13 MED ORDER — VITAMIN B-12 1000 MCG PO TABS
1000.0000 ug | ORAL_TABLET | Freq: Every day | ORAL | Status: DC
  Administered 2019-03-13 – 2019-03-17 (×5): 1000 ug via ORAL
  Filled 2019-03-13 (×5): qty 1

## 2019-03-13 MED ORDER — CHLORHEXIDINE GLUCONATE CLOTH 2 % EX PADS
6.0000 | MEDICATED_PAD | Freq: Once | CUTANEOUS | Status: AC
  Administered 2019-03-13: 23:00:00 6 via TOPICAL

## 2019-03-13 MED ORDER — ENOXAPARIN SODIUM 40 MG/0.4ML ~~LOC~~ SOLN
40.0000 mg | Freq: Every day | SUBCUTANEOUS | Status: DC
  Administered 2019-03-13 – 2019-03-17 (×5): 40 mg via SUBCUTANEOUS
  Filled 2019-03-13 (×5): qty 0.4

## 2019-03-13 NOTE — ED Notes (Signed)
Patient given urinal.  States he is unable to urinate at this time.  Educated on need for ordered urine specimen

## 2019-03-13 NOTE — Consult Note (Addendum)
Consult requested for right foot wound; performed remotely after review of the progress notes in the EMR.  Pt has a full thickness wound and red rash, according to notes, and has been diagnosed with osteomylitis and is scheduled for surgery on Monday.  This complex medical condition is beyond the scope of practice for WOC nurses. Ortho consult is pending, according to physician notes. Please refer to their team for further plan of care.  Topical treatment recommendations provided for bedside nurses to perform Q day until further recommendations are available: Apply moist gauze dressing and kerlex to right foot wound Q day Please re-consult if further assistance is needed.  Thank-you,  Cammie Mcgee MSN, RN, CWOCN, [place], CNS [phone]

## 2019-03-13 NOTE — Telephone Encounter (Signed)
Per Dr. Marylene Land, [name] was admitted at [facility].  She asked me to cancel his surgery at [facility]

## 2019-03-13 NOTE — Progress Notes (Signed)
Patient stated he has been taking off/putting on CPAP himself. Informed patient to call if needed any assistance.

## 2019-03-13 NOTE — Progress Notes (Signed)
PHARMACY - PHYSICIAN COMMUNICATION CRITICAL VALUE ALERT - BLOOD CULTURE IDENTIFICATION (BCID)  [name] is an 59 y.o. male who presented to [name] on 03/12/2019   Assessment:  Diabetic foot infection with MSSA in blood  Name of physician (or Provider) Contacted: Dr Elvera Lennox  Current antibiotics: Vanc Zosyn  Changes to prescribed antibiotics recommended:  DC Vanc Continue zosyn for now pending surgery Auto ID consult  Results for orders placed or performed during the hospital encounter of 03/12/19  Blood Culture ID Panel (Reflexed) (Collected: 03/13/2019 12:36 AM)  Result Value Ref Range   Enterococcus species NOT DETECTED NOT DETECTED   Listeria monocytogenes NOT DETECTED NOT DETECTED   Staphylococcus species DETECTED (A) NOT DETECTED   Staphylococcus aureus (BCID) DETECTED (A) NOT DETECTED   Methicillin resistance NOT DETECTED NOT DETECTED   Streptococcus species NOT DETECTED NOT DETECTED   Streptococcus agalactiae NOT DETECTED NOT DETECTED   Streptococcus pneumoniae NOT DETECTED NOT DETECTED   Streptococcus pyogenes NOT DETECTED NOT DETECTED   Acinetobacter baumannii NOT DETECTED NOT DETECTED   Enterobacteriaceae species NOT DETECTED NOT DETECTED   Enterobacter cloacae complex NOT DETECTED NOT DETECTED   Escherichia coli NOT DETECTED NOT DETECTED   Klebsiella oxytoca NOT DETECTED NOT DETECTED   Klebsiella pneumoniae NOT DETECTED NOT DETECTED   Proteus species NOT DETECTED NOT DETECTED   Serratia marcescens NOT DETECTED NOT DETECTED   Haemophilus influenzae NOT DETECTED NOT DETECTED   Neisseria meningitidis NOT DETECTED NOT DETECTED   Pseudomonas aeruginosa NOT DETECTED NOT DETECTED   Candida albicans NOT DETECTED NOT DETECTED   Candida glabrata NOT DETECTED NOT DETECTED   Candida krusei NOT DETECTED NOT DETECTED   Candida parapsilosis NOT DETECTED NOT DETECTED   Candida tropicalis NOT DETECTED NOT DETECTED   Elmer Sow, PharmD, BCPS, BCCCP Clinical  Pharmacist [phone]  Please check AMION for all [facility] Pharmacy numbers  03/13/2019 7:08 PM

## 2019-03-13 NOTE — Telephone Encounter (Signed)
[facility] I will see him in hospital and take care of him there. Cancel surgery at [facility] for Monday. Thanks Dr. Kathie Rhodes

## 2019-03-13 NOTE — ED Notes (Signed)
Lab states they did not receive COVID/flu swab for patient.  Recollected and taken to lab

## 2019-03-13 NOTE — Telephone Encounter (Signed)
I called Aram Beecham, scheduler at [facility], and canceled Mr. Lafon's surgery for 03/17/2019.

## 2019-03-13 NOTE — H&P (Addendum)
History and Physical    [name] KDX:[id number] DOB: 1960-04-06 DOA: 03/12/2019  PCP: Marden Noble, MD    Patient coming from: Home    Chief Complaint: Shortness of breath fever chills nausea vomiting general weakness  HPI: [name] is a 59 y.o. male with medical history significant of morbid obesity, hyperlipidemia, hypertension, diabetes, osteomyelitis right foot, supposed to have surgery Monday at [facility], came with a chief complaint of fever cough shortness of breath general weakness nausea vomiting. Patient was tested for Covid yesterday but does not have the results. Patient has documented osteomyelitis of the right foot  ED Course:  Patient with severe sepsis, fever chills vomiting Probably secondary to right foot osteomyelitis Covid test pending Patient started on IV fluids, Zosyn and vancomycin CPAP for shortness of breath Blood work acute kidney injury Mild DKA anion gap 15 Kept n.p.o. Lantus since insulin sliding scale per sensitivity factor When patient more stable and gap closed advance diet .  Troponin elevated, no chest pain, electrocardiogram no acute ST-T changes   Review of Systems: As per HPI otherwise 10 point review of systems negative.  Except fever chills shortness of breath general weakness fatigue nausea vomiting, redness right foot  Past Medical History:  Diagnosis Date  . Diabetes (HCC)   . Hyperlipidemia   . Hypertension   . NSTEMI (non-ST elevated myocardial infarction) (HCC)   . Obesity     Past Surgical History:  Procedure Laterality Date  . ACHILLES TENDON REPAIR Right   . heart stent  2012   x1  . SHOULDER ARTHROSCOPY Right   . UVULOPALATOPHARYNGOPLASTY       reports that he is a non-smoker but has been exposed to tobacco smoke. He has never used smokeless tobacco. He reports current alcohol use. He reports that he does not use drugs.  No Known Allergies  Family History  Problem Relation Age of Onset   . Cancer Other   . Diabetes Other   . Heart disease Other   . Breast cancer Mother   . Heart disease Father   . Diabetes Father   . Leukemia Brother   . Asthma Maternal Uncle      Prior to Admission medications   Medication Sig Start Date End Date Taking? Authorizing Provider  amLODipine (NORVASC) 5 MG tablet Take 5 mg by mouth daily. 04/16/18  Yes [provider]  aspirin EC 81 MG tablet Take 81 mg by mouth daily.   Yes [provider]  cholecalciferol (VITAMIN D3) 25 MCG (1000 UNIT) tablet Take 1,000 Units by mouth daily.   Yes [provider]  glimepiride (AMARYL) 4 MG tablet Take 4 mg by mouth daily with breakfast.  10/01/15  Yes [provider]  insulin NPH-regular Human (NOVOLIN 70/30) (70-30) 100 UNIT/ML injection Inject 40-50 Units into the skin 2 (two) times daily with a meal.    Yes [provider]  loratadine (CLARITIN) 10 MG tablet Take 10 mg by mouth daily.   Yes [provider]  metFORMIN (GLUCOPHAGE-XR) 500 MG 24 hr tablet Take 1,000 mg by mouth 2 (two) times daily.  10/01/15  Yes [provider]  montelukast (SINGULAIR) 10 MG tablet TAKE 1 TABLET BY MOUTH AT BEDTIME 01/14/17  Yes Roslynn Amble, MD  pantoprazole (PROTONIX) 40 MG tablet TAKE 1 TABLET BY MOUTH TWICE DAILY 01/08/17  Yes Roslynn Amble, MD  simvastatin (ZOCOR) 40 MG tablet Take 40 mg by mouth every evening.  11/14/17  Yes  [provider]  vitamin B-12 (CYANOCOBALAMIN) 1000 MCG tablet Take 1,000 mcg by mouth daily.   Yes [provider]  BD INSULIN SYRINGE U/F 31G X 5/16" 1 ML MISC See admin instructions. 05/02/18   [provider]  Spacer/Aero-Holding Chambers (AEROCHAMBER MV) inhaler Use as instructed 08/29/16   Roslynn Amble, MD  sulfamethoxazole-trimethoprim (BACTRIM) 400-80 MG tablet Take 1 tablet by mouth 2 (two) times daily. Patient not taking: Reported on 03/13/2019 02/23/19   [name], DPM     Physical Exam: Vitals:   03/13/19 0030 03/13/19 0045 03/13/19 0200 03/13/19 0245  BP:  (!) 148/82 136/78 121/73  Pulse: (!) 108 (!) 109 94 95  Resp:  (!) 23 19 (!) 25  Temp:      TempSrc:      SpO2: 95% 100% 95% 98%    Constitutional: NAD, calm, comfortable Vitals:   03/13/19 0030 03/13/19 0045 03/13/19 0200 03/13/19 0245  BP:  (!) 148/82 136/78 121/73  Pulse: (!) 108 (!) 109 94 95  Resp:  (!) 23 19 (!) 25  Temp:      TempSrc:      SpO2: 95% 100% 95% 98%   Eyes: PERRL, lids and conjunctivae normal ENMT: Mucous membranes are moist. Posterior pharynx clear of any exudate or lesions.Normal dentition.  Neck: normal, supple, no masses, no thyromegaly Respiratory: clear to auscultation bilaterally, no wheezing, no crackles. Normal respiratory effort. No accessory muscle use.  Cardiovascular: Regular rate and rhythm, no murmurs / rubs / gallops. No extremity edema. 2+ pedal pulses. No carotid bruits.  Abdomen: no tenderness, no masses palpated. No hepatosplenomegaly. Bowel sounds positive.  Musculoskeletal: no clubbing / cyanosis. No joint deformity upper and lower extremities. Good ROM, no contractures. Normal muscle tone.  Skin:  rashes, right foot Neurologic: CN 2-12 grossly intact. Sensation intact, DTR normal. Strength 5/5 in all 4.  Psychiatric: Normal judgment and insight. Alert and oriented x 3. Normal mood.    Labs on Admission: I have personally reviewed following labs and imaging studies  CBC: Recent Labs  Lab 03/12/19 2243  WBC 15.5*  NEUTROABS 13.6*  HGB 14.5  HCT 43.2  MCV 86.2  PLT 269   Basic Metabolic Panel: Recent Labs  Lab 03/12/19 2243  NA 127*  K 4.0  CL 93*  CO2 19*  GLUCOSE 356*  BUN 30*  CREATININE 1.83*  CALCIUM 8.8*   GFR: CrCl cannot be calculated (Unknown ideal weight.). Liver Function Tests: Recent Labs  Lab 03/12/19 2243  AST 30  ALT 24  ALKPHOS 79  BILITOT 1.3*  PROT 7.2  ALBUMIN 3.0*   No results for input(s):  LIPASE, AMYLASE in the last 168 hours. No results for input(s): AMMONIA in the last 168 hours. Coagulation Profile: No results for input(s): INR, PROTIME in the last 168 hours. Cardiac Enzymes: No results for input(s): CKTOTAL, CKMB, CKMBINDEX, TROPONINI in the last 168 hours. BNP (last 3 results) No results for input(s): PROBNP in the last 8760 hours. HbA1C: No results for input(s): HGBA1C in the last 72 hours. CBG: Recent Labs  Lab 03/13/19 0340  GLUCAP 334*   Lipid Profile: No results for input(s): CHOL, HDL, LDLCALC, TRIG, CHOLHDL, LDLDIRECT in the last 72 hours. Thyroid Function Tests: No results for input(s): TSH, T4TOTAL, FREET4, T3FREE, THYROIDAB in the last 72 hours. Anemia Panel: No results for input(s): VITAMINB12, FOLATE, FERRITIN, TIBC, IRON, RETICCTPCT in the last 72 hours. Urine analysis: No results found for: COLORURINE, APPEARANCEUR, LABSPEC, PHURINE, GLUCOSEU, HGBUR, BILIRUBINUR,  Derrill Memo, UROBILINOGEN, NITRITE, LEUKOCYTESUR  Radiological Exams on Admission: DG Chest Portable 1 View  Result Date: 03/12/2019 CLINICAL DATA:  Shortness of breath, fever, cough EXAM: PORTABLE CHEST 1 VIEW COMPARISON:  CT 11/30/2015, radiograph 09/26/2015 FINDINGS: Accounting for some atelectasis in the bases and patient body habitus, the lungs are clear. No consolidation, features of edema, pneumothorax, or effusion. The cardiomediastinal contours are unremarkable. No acute osseous or soft tissue abnormality. IMPRESSION: Accounting for body habitus, the lungs are clear. Electronically Signed   By: Lovena Le M.D.   On: 03/12/2019 23:47    EKG: Independently reviewed.  Sinus tachycardia no acute ST-T changes  Assessment and plan  Severe sepsis Fever chills shortness of breath, tachycardia tachypnea elevated white count  secondary to osteomyelitis right foot  Covid test respiratory panel pending Plan blood cultures IV fluids serial lactic acid Zosyn and  vancomycin Await for Covid test  Osteomyelitis right foot Supposed to have surgery next week Plan Zosyn vancomycin Needs to be called podiatrist see the last note  Diabetes mellitus with hyperglycemia Mild DKA Anion gap 15 secondary to renal failure and sepsis and mild DKA We will keep n.p.o. Give Lantus and insulin sliding scale per sensitivity factor When patient more stable advance diet  Obstructive sleep apnea CPAP during the night and as needed Respiratory consult  Acute kidney injury IV fluids follow BUN and creatinine avoid nephrotoxins  Elevated troponin probably secondary to demand angina No chest pain Electrocardiogram sinus tach no acute ST-T changes Plan follow enzymes/received aspirin in the emergency room  Essential hypertension Hold blood pressure medication  GERD PPI  Hyperlipidemia  statins  Morbid obesity BMI more than 40 Encouraged to lose weight   Assessment/Plan Principal Problem:   Sepsis (HCC) Active Problems:   OBESITY   Obesity, diabetes, and hypertension syndrome (HCC)   Obstructive sleep apnea   Diabetes mellitus (HCC)   Hyperlipidemia   GERD (gastroesophageal reflux disease)   Osteomyelitis of fifth toe of right foot (HCC)   DKA (diabetic ketoacidosis) (HCC)      DVT prophylaxis: Lovenox Code Status: Full code Family Communication: No team at home Disposition Plan: Home Consults called: Needs to be called in the morning Dr. Cannon Kettle podiatry Admission status: Full admission   Linard Millers Britini Garcilazo MD [facility]  If 7PM-7AM, please contact night-coverage [email or web address]   03/13/2019, 3:46 AM

## 2019-03-13 NOTE — ED Notes (Signed)
Patients sheets were soaking wet from patient sweating , linens changed patient resting comfortably at present.

## 2019-03-13 NOTE — Progress Notes (Signed)
Pt placed on Auto CPAP (Min 7-Max 17)at this time with 2 L O2 bled in. Pt states he feels much better wearing CPAP. Advised pt to notify for RT if he needs any further assistance with CPAP. Lights turned off, pt resting comfortably at this time. R will continue to monitor.

## 2019-03-13 NOTE — ED Notes (Signed)
Tele   Breakfast ordered  

## 2019-03-13 NOTE — Progress Notes (Signed)
Pharmacy Antibiotic Note  [name] is a 59 y.o. male admitted on 03/12/2019 with sepsis likely secondary to osteomyelitis.  Pharmacy has been consulted for Vancomycin/Zosyn dosing. WBC elevated. Noted renal dysfunction.   Plan: Vancomycin 2000 mg IV x 1, the 1000 mg IV q12h >>Estimated AUC: 504 Zosyn 3.375G IV q8h to be infused over 4 hours Trend WBC, temp, renal function  F/U infectious work-up Drug levels as indicated  Temp (24hrs), Avg:102.2 F (39 C), Min:101.2 F (38.4 C), Max:103.1 F (39.5 C)  Recent Labs  Lab 03/12/19 2243 03/13/19 0011  WBC 15.5*  --   CREATININE 1.83*  --   LATICACIDVEN  --  1.7    CrCl cannot be calculated (Unknown ideal weight.).    No Known Allergies  Abran Duke, PharmD, BCPS Clinical Pharmacist Phone: [phone]

## 2019-03-13 NOTE — Consult Note (Signed)
H&P/Consult: Podiatry  Reason: R Foot infection  Referring Doctor: Dr. Cruzita Lederer, MD   Consultant: Dr. Wallie Char, DPM   PYK:DXIPJA C Slane 59 y.o. male  patient with a history of DFU seen on several occassions in office for wound care was admitted yesterday for right foot infection/spesis. Patient reports that he is now feeling much better tham yesterday and reports no longer has fever and is able to eat without feeling nauseous.   This am patient denies any other pedal complaints. Denies Chills/night sweats/overnight events. No other issues noted.   Patient Active Problem List   Diagnosis Date Noted  . Acute sepsis ([place]) 03/13/2019  . Osteomyelitis of fifth toe of right foot ([place]) 03/13/2019  . DKA (diabetic ketoacidosis) ([place]) 03/13/2019  . Acute osteomyelitis of toe of right foot ([place])   . AKI (acute kidney injury) ([place])   . Demand ischemia ([place])   . Hyperglycemia   . Laryngopharyngeal reflux (LPR) 02/07/2016  . GERD (gastroesophageal reflux disease) 12/27/2015  . Arthritis 11/16/2015  . Diabetes mellitus ([place]) 11/16/2015  . Hyperlipidemia 11/16/2015  . Localized edema 11/16/2015  . OBESITY 05/15/2007  . ADD 05/15/2007  . Obesity, diabetes, and hypertension syndrome ([place]) 05/15/2007  . Allergic rhinitis 05/15/2007  . Obstructive sleep apnea 05/15/2007  . COUGH 05/15/2007    No current facility-administered medications on file prior to encounter.   Current Outpatient Medications on File Prior to Encounter  Medication Sig Dispense Refill  . amLODipine (NORVASC) 5 MG tablet Take 5 mg by mouth daily.    Marland Kitchen aspirin EC 81 MG tablet Take 81 mg by mouth daily.    . cholecalciferol (VITAMIN D3) 25 MCG (1000 UNIT) tablet Take 1,000 Units by mouth daily.    Marland Kitchen glimepiride (AMARYL) 4 MG tablet Take 4 mg by mouth daily with breakfast.     . insulin NPH-regular Human (NOVOLIN 70/30) (70-30) 100 UNIT/ML injection Inject 40-50 Units into the skin 2 (two) times daily with a meal.     .  loratadine (CLARITIN) 10 MG tablet Take 10 mg by mouth daily.    . metFORMIN (GLUCOPHAGE-XR) 500 MG 24 hr tablet Take 1,000 mg by mouth 2 (two) times daily.     . montelukast (SINGULAIR) 10 MG tablet TAKE 1 TABLET BY MOUTH AT BEDTIME 30 tablet 3  . pantoprazole (PROTONIX) 40 MG tablet TAKE 1 TABLET BY MOUTH TWICE DAILY 60 tablet 3  . simvastatin (ZOCOR) 40 MG tablet Take 40 mg by mouth every evening.   11  . vitamin B-12 (CYANOCOBALAMIN) 1000 MCG tablet Take 1,000 mcg by mouth daily.    . BD INSULIN SYRINGE U/F 31G X 5/16" 1 ML MISC See admin instructions.    Marland Kitchen Spacer/Aero-Holding Chambers (AEROCHAMBER MV) inhaler Use as instructed 1 each 0  . sulfamethoxazole-trimethoprim (BACTRIM) 400-80 MG tablet Take 1 tablet by mouth 2 (two) times daily. (Patient not taking: Reported on 03/13/2019) 28 tablet 0     No Known Allergies  Social History   Socioeconomic History  . Marital status: Married    Spouse name: Not on file  . Number of children: Not on file  . Years of education: Not on file  . Highest education level: Not on file  Occupational History  . Not on file  Tobacco Use  . Smoking status: Passive Smoke Exposure - Never Smoker  . Smokeless tobacco: Never Used  . Tobacco comment: Both parents growing up.   Substance and Sexual Activity  . Alcohol use: Yes  Comment: 4-5 on weekends  . Drug use: No  . Sexual activity: Not on file  Other Topics Concern  . Not on file  Social History Narrative   Originally from [place], [place]. Previously has lived in [place]. 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 No pets currently. No bird or hot tub exposure. Could have mold in his current home and has lived there for 1.5 years now.    Social Determinants of Health   Financial Resource Strain:   . Difficulty of Paying Living Expenses: Not on file  Food Insecurity:   . Worried About Charity fundraiser in the Last Year: Not on file  . Ran Out  of Food in the Last Year: Not on file  Transportation Needs:   . Lack of Transportation (Medical): Not on file  . Lack of Transportation (Non-Medical): Not on file  Physical Activity:   . Days of Exercise per Week: Not on file  . Minutes of Exercise per Session: Not on file  Stress:   . Feeling of Stress : Not on file  Social Connections:   . Frequency of Communication with Friends and Family: Not on file  . Frequency of Social Gatherings with Friends and Family: Not on file  . Attends Religious Services: Not on file  . Active Member of Clubs or Organizations: Not on file  . Attends Archivist Meetings: Not on file  . Marital Status: Not on file  Intimate Partner Violence:   . Fear of Current or Ex-Partner: Not on file  . Emotionally Abused: Not on file  . Physically Abused: Not on file  . Sexually Abused: Not on file    Past Surgical History:  Procedure Laterality Date  . ACHILLES TENDON REPAIR Right   . heart stent  2012   x1  . SHOULDER ARTHROSCOPY Right   . TOE SURGERY Right 06/2018   for a hammer toe  . UVULOPALATOPHARYNGOPLASTY      Family History  Problem Relation Age of Onset  . Cancer Other   . Diabetes Other   . Heart disease Other   . Breast cancer Mother   . Heart disease Father   . Diabetes Father   . Leukemia Brother   . Asthma Maternal Uncle      REVIEW OF SYSTEMS: Neurologic: Denies vertigo, syncope, convulsions or headaches. Musculoskeletal: No muscle or joint pain. Cardiorespiratory:  Denies shortness of breath, dyspnea on exertion, chest pain,  + cough but no  hemoptysis. Gastrointestinal: Denies loose stool, Denies emesis, melena, constipationor rectal  bleeding. Genitourinary: No difficulty with voiding noted.  PHYSICAL EXAMINATION:  Today's Vitals   03/13/19 0930 03/13/19 1228 03/13/19 1240 03/13/19 1340  BP:  (!) 157/87    Pulse:  (!) 109    Resp:      Temp:  100 F (37.8 C)    TempSrc:  Oral    SpO2:  98%     Weight:      Height:      PainSc: 0-No pain  4  0-No pain    GENERAL: Well-developed, well-nourished, in no acute distress. Alert and cooperative.  DERMATOLOGY: Skin warm and supple bilateral with marked cellulitis to the level of the dorsolateral midfoot on right VASCULAR: Dorsalis Pedis 2/4 and Posterior Tibial 1/4 pedal pulses bilateral, Temperature gradient increased on right, Capillary fill time 3 seconds, positive pedal hair growth present bilateral. NEUROLOGY: Protective sensation severely diminished  MUSCULOSKELETAL:+ pain with palpation to area of concernon right foot    MRI R foot performed at [facility] consistent with Osteomyelitis at 5th MTPJ   Labs: As in chart  ASSESSMENTS:  1.Cellulitis/Abscess Right foot 2. Acute osteomyelitis 3. Diabetic foot ulcer, sub met 5 on right 4. Diabetic neuropathy  PLAN OF CARE: Patient seen and evaluated 1. History and physical completed and chart reviewed  2. Patient to be NPO after midnight for OR tomorrow 03-14-19 3. Previous Imaging reviewed  4. Consent for surgery to be obtained  5. Patient to undergo right foot I&D with possible 5th toe amputation  6. Case discussed with patient 7. Continue with broad spectrum antibiotics will tailor based on intra-op cultures  8. Weight bear for bedside and bathroom to R heel only. 9. Consult appreciated 10. Podiatry to follow   Landis Martins, DPM

## 2019-03-13 NOTE — Telephone Encounter (Signed)
"  Dr. Marylene Land is supposed to surgery on me Monday.  Since then, I have gone to the hospital with a high fever.  I do not have Covid and I do not have the flu.  What's happened is that my foot has become inflammed.  It's taken off and given me all kind of high grade fever.  I'm in the hospital right now but Dr. Marylene Land is coming over sometime this afternoon.  I don't know when and they are going to make a determination about what to do with me.  I just want to let you know."

## 2019-03-13 NOTE — Progress Notes (Signed)
Inpatient Diabetes Program Recommendations  AACE/ADA: New Consensus Statement on Inpatient Glycemic Control   Target Ranges:  Prepandial:   less than 140 mg/dL      Peak postprandial:   less than 180 mg/dL (1-2 hours)      Critically ill patients:  140 - 180 mg/dL   Results for Kozub, Tallen C "[place]" (MRN [id number]) as of 03/13/2019 12:34  Ref. Range 03/13/2019 03:40 03/13/2019 09:06 03/13/2019 11:43  Glucose-Capillary Latest Ref Range: 70 - 99 mg/dL 698 (H) 614 (H) 830 (H)    Review of Glycemic Control  Diabetes history: DM2 Outpatient Diabetes medications: Amaryl 4 mg QAM, 70/30 40-50 units BID, Metformin XR 1000 mg BID Current orders for Inpatient glycemic control: Novolog 0-20 units Q4H  Inpatient Diabetes Program Recommendations:    Insulin-Basal: Noted patient received Lantus 40 units at 5:35 am today as one time order. Please consider ordering Lantus 40 units daily (to start 03/14/19).  Thanks, Orlando Penner, RN, MSN, CDE Diabetes Coordinator Inpatient Diabetes Program [phone] (Team Pager from 8am to 5pm)

## 2019-03-13 NOTE — Progress Notes (Signed)
No charge note  Patient seen and examined this morning, H&P reviewed and agree with assessment and plan.  This is a 59 year old male with history of morbid obesity, HLD, HTN, DM 2, right foot osteomyelitis supposed to have surgery Monday, came with chief complaints of fever, generalized weakness, and also has noted his right foot has become more red.  He was found to be septic with fever, elevated white count, likely in the setting of right foot osteomyelitis/cellulitis  Sepsis due to right foot cellulitis/underlying osteomyelitis -He follows with Dr. Marylene Land as an outpatient, underwent an MRI in [place] which showed osteomyelitis and he was scheduled to have surgery done to his right foot on Monday, 03/16/2019.  I have consulted Dr. Marylene Land who will see patient later today  DM2, hyperglycemia -Continue Lantus, sliding scale  CBG (last 3)  Recent Labs    03/13/19 0340 03/13/19 0906  GLUCAP 334* 234*   OSA -Nightly CPAP  Acute kidney injury -Continue IV fluids, avoid nephrotoxic agents  Elevated troponin -Secondary to demand ischemia, no chest pain, not in a pattern consistent with ACS  Essential hypertension -Hold home medications in the setting of sepsis  Hyperlipidemia -Continue statin  Morbid obesity with BMI greater than 40 -Patient will benefit from weight loss   Scheduled Meds: . aspirin EC  81 mg Oral Daily  . cholecalciferol  1,000 Units Oral Daily  . enoxaparin (LOVENOX) injection  40 mg Subcutaneous Daily  . insulin aspart  0-20 Units Subcutaneous Q4H  . montelukast  10 mg Oral QHS  . pantoprazole  40 mg Oral BID  . simvastatin  40 mg Oral QPM  . sodium chloride flush  3 mL Intravenous Q12H  . vitamin B-12  1,000 mcg Oral Daily   Continuous Infusions: . sodium chloride 125 mL/hr at 03/13/19 0550  . sodium chloride    . piperacillin-tazobactam (ZOSYN)  IV    . vancomycin     PRN Meds:.sodium chloride, acetaminophen **OR** acetaminophen, hydrALAZINE,  ondansetron **OR** ondansetron (ZOFRAN) IV, sodium chloride flush  Ryliee Figge M. Elvera Lennox, MD, PhD [facility]  Between 7 am - 7 pm I am available, please contact me via Amion or Securechat Between 7 pm - 7 am I am not available, please contact night coverage MD/APP via Amion

## 2019-03-14 ENCOUNTER — Inpatient Hospital Stay (HOSPITAL_COMMUNITY): Payer: BC Managed Care – PPO | Admitting: Certified Registered Nurse Anesthetist

## 2019-03-14 ENCOUNTER — Inpatient Hospital Stay (HOSPITAL_COMMUNITY): Payer: BC Managed Care – PPO

## 2019-03-14 ENCOUNTER — Encounter (HOSPITAL_COMMUNITY): Payer: Self-pay | Admitting: Emergency Medicine

## 2019-03-14 ENCOUNTER — Encounter (HOSPITAL_COMMUNITY)
Admission: EM | Disposition: A | Discharge status: Home Health | Payer: Self-pay | Source: Home / Self Care | Attending: Internal Medicine

## 2019-03-14 DIAGNOSIS — L97512 Non-pressure chronic ulcer of other part of right foot with fat layer exposed: Secondary | ICD-10-CM | POA: Diagnosis not present

## 2019-03-14 DIAGNOSIS — A419 Sepsis, unspecified organism: Secondary | ICD-10-CM | POA: Diagnosis not present

## 2019-03-14 DIAGNOSIS — Z794 Long term (current) use of insulin: Secondary | ICD-10-CM | POA: Diagnosis not present

## 2019-03-14 DIAGNOSIS — L97514 Non-pressure chronic ulcer of other part of right foot with necrosis of bone: Secondary | ICD-10-CM | POA: Diagnosis not present

## 2019-03-14 DIAGNOSIS — E1140 Type 2 diabetes mellitus with diabetic neuropathy, unspecified: Secondary | ICD-10-CM | POA: Diagnosis not present

## 2019-03-14 DIAGNOSIS — E1169 Type 2 diabetes mellitus with other specified complication: Secondary | ICD-10-CM | POA: Diagnosis not present

## 2019-03-14 DIAGNOSIS — M86171 Other acute osteomyelitis, right ankle and foot: Secondary | ICD-10-CM | POA: Diagnosis not present

## 2019-03-14 DIAGNOSIS — I10 Essential (primary) hypertension: Secondary | ICD-10-CM | POA: Diagnosis not present

## 2019-03-14 DIAGNOSIS — M869 Osteomyelitis, unspecified: Secondary | ICD-10-CM | POA: Diagnosis not present

## 2019-03-14 DIAGNOSIS — Z89421 Acquired absence of other right toe(s): Secondary | ICD-10-CM | POA: Diagnosis not present

## 2019-03-14 DIAGNOSIS — R509 Fever, unspecified: Secondary | ICD-10-CM | POA: Diagnosis not present

## 2019-03-14 DIAGNOSIS — A4101 Sepsis due to Methicillin susceptible Staphylococcus aureus: Secondary | ICD-10-CM | POA: Diagnosis not present

## 2019-03-14 HISTORY — PX: AMPUTATION TOE: SHX6595

## 2019-03-14 HISTORY — PX: INCISION AND DRAINAGE: SHX5863

## 2019-03-14 LAB — GLUCOSE, CAPILLARY
Glucose-Capillary: 134 mg/dL — ABNORMAL HIGH (ref 70–99)
Glucose-Capillary: 152 mg/dL — ABNORMAL HIGH (ref 70–99)
Glucose-Capillary: 151 mg/dL — ABNORMAL HIGH (ref 70–99)
Glucose-Capillary: 154 mg/dL — ABNORMAL HIGH (ref 70–99)
Glucose-Capillary: 181 mg/dL — ABNORMAL HIGH (ref 70–99)
Glucose-Capillary: 241 mg/dL — ABNORMAL HIGH (ref 70–99)
Glucose-Capillary: 299 mg/dL — ABNORMAL HIGH (ref 70–99)

## 2019-03-14 LAB — COMPREHENSIVE METABOLIC PANEL
ALT: 39 U/L (ref 0–44)
AST: 71 U/L — ABNORMAL HIGH (ref 15–41)
Albumin: 2.2 g/dL — ABNORMAL LOW (ref 3.5–5.0)
Alkaline Phosphatase: 84 U/L (ref 38–126)
Anion gap: 10 (ref 5–15)
BUN: 21 mg/dL — ABNORMAL HIGH (ref 6–20)
CO2: 26 mmol/L (ref 22–32)
Calcium: 8.2 mg/dL — ABNORMAL LOW (ref 8.9–10.3)
Chloride: 98 mmol/L (ref 98–111)
Creatinine, Ser: 1.66 mg/dL — ABNORMAL HIGH (ref 0.61–1.24)
GFR calc Af Amer: 53 mL/min — ABNORMAL LOW (ref >60)
GFR calc non Af Amer: 45 mL/min — ABNORMAL LOW (ref >60)
Glucose, Bld: 149 mg/dL — ABNORMAL HIGH (ref 70–99)
Potassium: 3.7 mmol/L (ref 3.5–5.1)
Sodium: 134 mmol/L — ABNORMAL LOW (ref 135–145)
Total Bilirubin: 1.2 mg/dL (ref 0.3–1.2)
Total Protein: 5.9 g/dL — ABNORMAL LOW (ref 6.5–8.1)

## 2019-03-14 LAB — CBC
HCT: 38.2 % — ABNORMAL LOW (ref 39.0–52.0)
Hemoglobin: 12.7 g/dL — ABNORMAL LOW (ref 13.0–17.0)
MCH: 28.8 pg (ref 26.0–34.0)
MCHC: 33.2 g/dL (ref 30.0–36.0)
MCV: 86.6 fL (ref 80.0–100.0)
Platelets: 202 10*3/uL (ref 150–400)
RBC: 4.41 MIL/uL (ref 4.22–5.81)
RDW: 12.9 % (ref 11.5–15.5)
WBC: 6.4 10*3/uL (ref 4.0–10.5)
nRBC: 0 % (ref 0.0–0.2)

## 2019-03-14 LAB — SURGICAL PCR SCREEN
MRSA, PCR: NEGATIVE
Staphylococcus aureus: NEGATIVE

## 2019-03-14 LAB — MAGNESIUM: Magnesium: 1.7 mg/dL (ref 1.7–2.4)

## 2019-03-14 SURGERY — INCISION AND DRAINAGE
Anesthesia: Monitor Anesthesia Care | Site: Toe | Laterality: Right

## 2019-03-14 MED ORDER — PROPOFOL 10 MG/ML IV BOLUS
INTRAVENOUS | Status: AC
  Filled 2019-03-14: qty 20

## 2019-03-14 MED ORDER — GUAIFENESIN-CODEINE 100-10 MG/5ML PO SOLN
10.0000 mL | Freq: Four times a day (QID) | ORAL | Status: DC | PRN
  Administered 2019-03-14 – 2019-03-16 (×8): 10 mL via ORAL
  Filled 2019-03-14 (×8): qty 10

## 2019-03-14 MED ORDER — BUPIVACAINE HCL (PF) 0.25 % IJ SOLN
INTRAMUSCULAR | Status: AC
  Filled 2019-03-14: qty 30

## 2019-03-14 MED ORDER — FENTANYL CITRATE (PF) 250 MCG/5ML IJ SOLN
INTRAMUSCULAR | Status: DC | PRN
  Administered 2019-03-14: 50 ug via INTRAVENOUS

## 2019-03-14 MED ORDER — PROPOFOL 1000 MG/100ML IV EMUL
INTRAVENOUS | Status: AC
  Filled 2019-03-14: qty 100

## 2019-03-14 MED ORDER — PROPOFOL 500 MG/50ML IV EMUL
INTRAVENOUS | Status: DC | PRN
  Administered 2019-03-14: 80 ug/kg/min via INTRAVENOUS

## 2019-03-14 MED ORDER — FENTANYL CITRATE (PF) 250 MCG/5ML IJ SOLN
INTRAMUSCULAR | Status: AC
  Filled 2019-03-14: qty 5

## 2019-03-14 MED ORDER — PROPOFOL 10 MG/ML IV BOLUS
INTRAVENOUS | Status: DC | PRN
  Administered 2019-03-14: 15 mg via INTRAVENOUS
  Administered 2019-03-14: 20 mg via INTRAVENOUS

## 2019-03-14 MED ORDER — SODIUM CHLORIDE 0.9 % IV SOLN
INTRAVENOUS | Status: DC | PRN
  Administered 2019-03-14 – 2019-03-15 (×2): 1000 mL via INTRAVENOUS

## 2019-03-14 MED ORDER — MIDAZOLAM HCL 2 MG/2ML IJ SOLN
INTRAMUSCULAR | Status: AC
  Filled 2019-03-14: qty 2

## 2019-03-14 MED ORDER — INSULIN ASPART 100 UNIT/ML ~~LOC~~ SOLN
0.0000 [IU] | Freq: Three times a day (TID) | SUBCUTANEOUS | Status: DC
  Administered 2019-03-14: 21:00:00 11 [IU] via SUBCUTANEOUS
  Administered 2019-03-14 – 2019-03-15 (×2): 7 [IU] via SUBCUTANEOUS
  Administered 2019-03-15: 3 [IU] via SUBCUTANEOUS
  Administered 2019-03-15: 07:00:00 4 [IU] via SUBCUTANEOUS
  Administered 2019-03-15: 21:00:00 11 [IU] via SUBCUTANEOUS
  Administered 2019-03-15 – 2019-03-16 (×2): 7 [IU] via SUBCUTANEOUS
  Administered 2019-03-16: 07:00:00 4 [IU] via SUBCUTANEOUS
  Administered 2019-03-16: 15 [IU] via SUBCUTANEOUS
  Administered 2019-03-16 – 2019-03-17 (×2): 4 [IU] via SUBCUTANEOUS
  Administered 2019-03-17: 06:00:00 7 [IU] via SUBCUTANEOUS

## 2019-03-14 MED ORDER — HYDROMORPHONE HCL 1 MG/ML IJ SOLN: 0.2500 mg | INTRAMUSCULAR | Status: DC | PRN

## 2019-03-14 MED ORDER — LACTATED RINGERS IV SOLN: INTRAVENOUS | Status: DC

## 2019-03-14 MED ORDER — MIDAZOLAM HCL 2 MG/2ML IJ SOLN
INTRAMUSCULAR | Status: DC | PRN
  Administered 2019-03-14: 2 mg via INTRAVENOUS

## 2019-03-14 MED ORDER — LIDOCAINE HCL (PF) 1 % IJ SOLN
INTRAMUSCULAR | Status: DC | PRN
  Administered 2019-03-14: 20 mL via INTRADERMAL

## 2019-03-14 MED ORDER — LIDOCAINE HCL (PF) 1 % IJ SOLN
INTRAMUSCULAR | Status: AC
  Filled 2019-03-14: qty 30

## 2019-03-14 MED ORDER — BUPIVACAINE HCL 0.25 % IJ SOLN
INTRAMUSCULAR | Status: DC | PRN
  Administered 2019-03-14: 20 mL

## 2019-03-14 SURGICAL SUPPLY — 32 items
BLADE SAW SGTL NAR THIN XSHT (BLADE) ×3 IMPLANT
BLADE SURG 10 STRL SS (BLADE) ×3 IMPLANT
BNDG COHESIVE 4X5 TAN STRL (GAUZE/BANDAGES/DRESSINGS) ×3 IMPLANT
BNDG ELASTIC 4X5.8 VLCR STR LF (GAUZE/BANDAGES/DRESSINGS) ×3 IMPLANT
BNDG ESMARK 4X9 LF (GAUZE/BANDAGES/DRESSINGS) ×3 IMPLANT
BNDG GAUZE ELAST 4 BULKY (GAUZE/BANDAGES/DRESSINGS) ×3 IMPLANT
COVER SURGICAL LIGHT HANDLE (MISCELLANEOUS) ×3 IMPLANT
COVER WAND RF STERILE (DRAPES) ×3 IMPLANT
CUFF TOURN SGL QUICK 18X4 (TOURNIQUET CUFF) ×3 IMPLANT
CUFF TOURN SGL QUICK 24 (TOURNIQUET CUFF) ×1
CUFF TRNQT CYL 24X4X16.5-23 (TOURNIQUET CUFF) ×2 IMPLANT
DRSG PAD ABDOMINAL 8X10 ST (GAUZE/BANDAGES/DRESSINGS) ×3 IMPLANT
DURAPREP 26ML APPLICATOR (WOUND CARE) ×3 IMPLANT
GAUZE PACKING IODOFORM 1/4X15 (GAUZE/BANDAGES/DRESSINGS) ×3 IMPLANT
GAUZE SPONGE 4X4 12PLY STRL LF (GAUZE/BANDAGES/DRESSINGS) ×3 IMPLANT
GAUZE SPONGE 4X4 16PLY XRAY LF (GAUZE/BANDAGES/DRESSINGS) ×3 IMPLANT
GLOVE BIO SURGEON STRL SZ 6.5 (GLOVE) ×3 IMPLANT
GLOVE INDICATOR 6.5 STRL GRN (GLOVE) ×3 IMPLANT
GOWN STRL REUS W/ TWL LRG LVL3 (GOWN DISPOSABLE) ×4 IMPLANT
GOWN STRL REUS W/TWL LRG LVL3 (GOWN DISPOSABLE) ×2
KIT BASIN OR (CUSTOM PROCEDURE TRAY) ×3 IMPLANT
KIT TURNOVER KIT B (KITS) ×3 IMPLANT
NEEDLE HYPO 25GX1X1/2 BEV (NEEDLE) ×3 IMPLANT
NS IRRIG 1000ML POUR BTL (IV SOLUTION) ×3 IMPLANT
PACK ORTHO EXTREMITY (CUSTOM PROCEDURE TRAY) ×3 IMPLANT
PAD ABD 8X10 STRL (GAUZE/BANDAGES/DRESSINGS) ×3 IMPLANT
PAD ARMBOARD 7.5X6 YLW CONV (MISCELLANEOUS) ×6 IMPLANT
SUT PROLENE 3 0 PS 2 (SUTURE) ×6 IMPLANT
SUT VIC AB 2-0 SH 27 (SUTURE) ×1
SUT VIC AB 2-0 SH 27XBRD (SUTURE) ×2 IMPLANT
SYR CONTROL 10ML LL (SYRINGE) ×3 IMPLANT
TUBE CONNECTING 12X1/4 (SUCTIONS) ×3 IMPLANT

## 2019-03-14 NOTE — Anesthesia Procedure Notes (Signed)
Procedure Name: MAC Date/Time: 03/14/2019 8:58 AM Performed by: Janace Litten, CRNA Pre-anesthesia Checklist: Patient identified, Emergency Drugs available, Suction available and Patient being monitored Patient Re-evaluated:Patient Re-evaluated prior to induction Oxygen Delivery Method: Simple face mask

## 2019-03-14 NOTE — Op Note (Signed)
DATE: 03-14-19  SURGEON: Landis Martins, DPM  PREOPERATIVE DIAGNOSIS: Right 5th toe osteomyelitis and cellulitis with abscess     POSTOPERATIVE DIAGNOSIS:Same     PROCEDURE PERFORMED:right foot incision and drainage with 5th toe and distal metatarsal amputation     HEMOSTASIS: right ankle tourniquet     ESTIMATED BLOOD LOSS: Minimal     ANESTHESIA: MAC with local 20cc of 1:1 mixture 1% lidocaine and 0.5% marcaine plain pre and post for total of 40ccs     SPECIMENS:Bone and soft tissue- path, wound swab- micro   COMPLICATIONS: None.     INDICATIONS FOR PROCEDURE: This patient is a pleasant 59 y.o. diabetic male who has been seen in office for infected toe with history of DFU that started to develop after the wound would not heal. Patient opt for surgical management. Risks and complications include but are not limited to infection, recurrence of symptoms, pain, numbness, wound dehiscence, delayed healing, as well as need for future surgery/further amputation. No guarantees were given or applied. All questions were answered to the patient's satisfaction, and the patient has consented to the above procedure. All preoperative labs and H&P, medical clearances have been obtained and NPO status past midnight has been confirmed. After surgery patient to be transferred back to medical floor for continued medical management.     PREPARATION FOR PROCEDURE: The patient was brought to the operating room and placed on the operating table in supine position. A pneumatic ankle tourniquet was placed about the patient's right foot but not yet inflated. After the department of anesthesia had administered MAC anesthesia. A local block was administered andthe right foot was then scrubbed, prepped, and draped in the usual aseptic manner. An Esmarch bandage was utilized to exsanguinate the patient's right foot and leg, and the pneumatic tourniquet was inflated to 250  mmHg.  PROCEDURE IN DETAIL: Attention was then directed to the dorsomedial aspect of the patient's right foot where a racquet-type incision was made, extending distally, looping around the 5th toe and into the digital web space. The incision was made in the routine fashion, single layer down to the level of bone. When doing so, there was purulence noted at the incision site and puss draining from the plantar ulceration site. The incision was carried deep down to the level of the fifth metatarsophalangeal joint where the 5th toe was disarticulated at that level. Once disarticulated, the 5th metatarsophalangeal joint, the 5th toe was removed from the operative field and placed on the back stable to be sent for pathologic analysis. The 5th metatarsal head was inspected and resected at level of midshaft. Any remaining devitalizing infected tissue was debrided as necessary. The extensor tendon was dissected as proximally as visible and transected at that level. The operative site was then irrigated utilizing a pulse lavage, 3 liters of normal sterile saline. A small tissue culture from the remaining tissue/bone was then taken post irrigation, to be sent for culture and sensitivity analysis as clean margins. Also, bleeders were bovied as necessary. Skin closure was then obtained utilizing 2-0 vicryl and 3-0 prolene and 0 prolene in combination of simple interrupted suture fashion. The incision was partially closed with the distal 1.3 left open measuring less than 1cm.   The right foot was then dressed with 1/2 inch packing overlying the opening distal incision and suture sites, 4 x 4 gauze, abd, Kerlix, coban and ACE. At this time, the right pneumatic tourniquet was deflated, and a positive hyperemic response was noted to the remaninig  digits. The patient tolerated the procedure and anesthesia well. Upon transfer to the recovery room, the patient's vital signs were stable, and neurovascular status was intact.    Podiatry to follow. Patient to be transferred back to floor for continue medical management.  Anticipating send patient home on PO antibiotics after intra-op culture results are available with follow up in office for wound/dressing care.  Landis Martins, DPM

## 2019-03-14 NOTE — Anesthesia Preprocedure Evaluation (Addendum)
Anesthesia Evaluation  Patient identified by MRN, date of birth, ID band Patient awake    Reviewed: Allergy & Precautions, H&P , NPO status , Patient's Chart, lab work & pertinent test results  Airway Mallampati: III  TM Distance: >3 FB Neck ROM: Full    Dental no notable dental hx. (+) Teeth Intact, Dental Advisory Given   Pulmonary sleep apnea and Continuous Positive Airway Pressure Ventilation ,    Pulmonary exam normal breath sounds clear to auscultation       Cardiovascular hypertension, + Past MI   Rhythm:Regular Rate:Normal     Neuro/Psych negative neurological ROS  negative psych ROS   GI/Hepatic Neg liver ROS, GERD  ,  Endo/Other  diabetes, Insulin Dependent, Oral Hypoglycemic AgentsMorbid obesity  Renal/GU Renal InsufficiencyRenal disease  negative genitourinary   Musculoskeletal  (+) Arthritis , Osteoarthritis,    Abdominal   Peds  Hematology negative hematology ROS (+)   Anesthesia Other Findings   Reproductive/Obstetrics negative OB ROS                            Anesthesia Physical Anesthesia Plan  ASA: III  Anesthesia Plan: MAC   Post-op Pain Management:    Induction: Intravenous  PONV Risk Score and Plan: 2 and Ondansetron, Propofol infusion and Midazolam  Airway Management Planned: Simple Face Mask  Additional Equipment:   Intra-op Plan:   Post-operative Plan:   Informed Consent: I have reviewed the patients History and Physical, chart, labs and discussed the procedure including the risks, benefits and alternatives for the proposed anesthesia with the patient or authorized representative who has indicated his/her understanding and acceptance.     Dental advisory given  Plan Discussed with: CRNA  Anesthesia Plan Comments:         Anesthesia Quick Evaluation

## 2019-03-14 NOTE — Plan of Care (Signed)
  Problem: Education: Goal: Knowledge of General Education information will improve Description Including pain rating scale, medication(s)/side effects and non-pharmacologic comfort measures Outcome: Progressing   Problem: Health Behavior/Discharge Planning: Goal: Ability to manage health-related needs will improve Outcome: Progressing   

## 2019-03-14 NOTE — Consult Note (Signed)
[facility]  Total days of antibiotics 3/vanco-piptazo               Reason for Consult:staph aureus bacteremia    Referring Physician: [email or web address]  Principal Problem:   Acute sepsis (HCC) Active Problems:   OBESITY   Obesity, diabetes, and hypertension syndrome (HCC)   Obstructive sleep apnea   Diabetes mellitus (HCC)   Hyperlipidemia   GERD (gastroesophageal reflux disease)   Osteomyelitis of fifth toe of right foot (HCC)   DKA (diabetic ketoacidosis) (HCC)    HPI: [name] is a [age] y.o. male with history of DM, DFU, HTN, obesity admitted with fever, cough ,n/v,in the setting of worsening erythema to his right foot that he is to have upcoming surgery with dr stover, his podiatrist. He was ruled out for covid, but hfound to have high temp of 103.29F with WBC fo 10K. Physical exam did show erythema, swelling drainage to lateral foot near ulceration. He was started on vanco/piptazo. His infectious work up revealed MSSA bacteremia. He was taken to the OR on 1/30 for I x D and distal 5th MT amputation of right foot by dr stover. Tissue was sent for culture. His fever curve is trending down.   Past Medical History:  Diagnosis Date  . Diabetes (HCC)   . Hyperlipidemia   . Hypertension   . NSTEMI (non-ST elevated myocardial infarction) (HCC)   . Obesity     Allergies: No Known Allergies   MEDICATIONS: . aspirin EC  81 mg Oral Daily  . cholecalciferol  1,000 Units Oral Daily  . enoxaparin (LOVENOX) injection  40 mg Subcutaneous Daily  . insulin aspart  0-20 Units Subcutaneous Q4H  . montelukast  10 mg Oral QHS  . pantoprazole  40 mg Oral BID  . simvastatin  40 mg Oral QPM  . sodium chloride flush  3 mL Intravenous Q12H  . vitamin B-12  1,000 mcg Oral Daily    Social History   Tobacco Use  . Smoking status: Passive Smoke Exposure - Never Smoker  . Smokeless tobacco: Never Used  . Tobacco comment: Both parents growing up.   Substance Use  Topics  . Alcohol use: Yes    Comment: 4-5 on weekends  . Drug use: No    Family History  Problem Relation Age of Onset  . Cancer Other   . Diabetes Other   . Heart disease Other   . Breast cancer Mother   . Heart disease Father   . Diabetes Father   . Leukemia Brother   . Asthma Maternal Uncle     Review of Systems -   OBJECTIVE: Temp:  [97.4 F (36.3 C)-101.4 F (38.6 C)] 98.5 F (36.9 C) (01/30 1154) Pulse Rate:  [82-98] 82 (01/30 1154) Resp:  [18-29] 21 (01/30 1154) BP: (119-146)/(61-85) 119/61 (01/30 1154) SpO2:  [95 %-98 %] 98 % (01/30 1154) Weight:  [142 kg] 142 kg (01/30 0013) Physical Exam  Constitutional: He is oriented to person, place, and time. He appears well-developed and well-nourished. No distress.  HENT:  Mouth/Throat: Oropharynx is clear and moist. No oropharyngeal exudate.  Cardiovascular: Normal rate, regular rhythm and normal heart sounds. Exam reveals no gallop and no friction rub.  No murmur heard.  Pulmonary/Chest: Effort normal and breath sounds normal. No respiratory distress. He has no wheezes.  Abdominal: Soft. Bowel sounds are normal. He exhibits no distension. There is no tenderness.  Lymphadenopathy:  He has no cervical adenopathy.  Neurological: He is alert and oriented to person, place, and time.  Ext: foot is wrappedfrom right 5th toe amputation Psychiatric: He has a normal mood and affect. His behavior is normal.     LABS: Results for orders placed or performed during the hospital encounter of 03/12/19 (from the past 48 hour(s))  CBC with Differential     Status: Abnormal   Collection Time: 03/12/19 10:43 PM  Result Value Ref Range   WBC 15.5 (H) 4.0 - 10.5 K/uL   RBC 5.01 4.22 - 5.81 MIL/uL   Hemoglobin 14.5 13.0 - 17.0 g/dL   HCT 43.2 39.0 - 52.0 %   MCV 86.2 80.0 - 100.0 fL   MCH 28.9 26.0 - 34.0 pg   MCHC 33.6 30.0 - 36.0 g/dL   RDW 13.0 11.5 - 15.5 %   Platelets 269 150 - 400 K/uL   nRBC 0.0 0.0 - 0.2 %    Neutrophils Relative % 86 %   Neutro Abs 13.6 (H) 1.7 - 7.7 K/uL   Lymphocytes Relative 3 %   Lymphs Abs 0.4 (L) 0.7 - 4.0 K/uL   Monocytes Relative 8 %   Monocytes Absolute 1.2 (H) 0.1 - 1.0 K/uL   Eosinophils Relative 1 %   Eosinophils Absolute 0.1 0.0 - 0.5 K/uL   Basophils Relative 1 %   Basophils Absolute 0.1 0.0 - 0.1 K/uL   Immature Granulocytes 1 %   Abs Immature Granulocytes 0.12 (H) 0.00 - 0.07 K/uL    Comment: Performed at [facility], [address], [place], [place] [address]  Comprehensive metabolic panel     Status: Abnormal   Collection Time: 03/12/19 10:43 PM  Result Value Ref Range   Sodium 127 (L) 135 - 145 mmol/L   Potassium 4.0 3.5 - 5.1 mmol/L   Chloride 93 (L) 98 - 111 mmol/L   CO2 19 (L) 22 - 32 mmol/L   Glucose, Bld 356 (H) 70 - 99 mg/dL   BUN 30 (H) 6 - 20 mg/dL   Creatinine, Ser 1.83 (H) 0.61 - 1.24 mg/dL   Calcium 8.8 (L) 8.9 - 10.3 mg/dL   Total Protein 7.2 6.5 - 8.1 g/dL   Albumin 3.0 (L) 3.5 - 5.0 g/dL   AST 30 15 - 41 U/L   ALT 24 0 - 44 U/L   Alkaline Phosphatase 79 38 - 126 U/L   Total Bilirubin 1.3 (H) 0.3 - 1.2 mg/dL   GFR calc non Af Amer 40 (L) >60 mL/min   GFR calc Af Amer 47 (L) >60 mL/min   Anion gap 15 5 - 15    Comment: Performed at [facility], [place] [address], [place], [place] [address]  Troponin I (High Sensitivity)     Status: Abnormal   Collection Time: 03/12/19 10:43 PM  Result Value Ref Range   Troponin I (High Sensitivity) 56 (H) <18 ng/L    Comment: (NOTE) Elevated high sensitivity troponin I (hsTnI) values and significant  changes across serial measurements may suggest ACS but many other  chronic and acute conditions are known to elevate hsTnI results.  Refer to the "Links" section for chest pain algorithms and additional  guidance. Performed at [facility], [place] [address], [place], [place] [address]   Blood culture (routine x 2)     Status: Abnormal (Preliminary result)   Collection Time:  03/13/19 12:11 AM   Specimen: BLOOD  Result Value Ref Range   Specimen Description BLOOD LEFT FOREARM  Special Requests      BOTTLES DRAWN AEROBIC AND ANAEROBIC Blood Culture adequate volume   Culture  Setup Time      GRAM POSITIVE COCCI IN CLUSTERS IN BOTH AEROBIC AND ANAEROBIC BOTTLES CRITICAL RESULT CALLED TO, READ BACK BY AND VERIFIED WITH: [facility] 03/13/19 1829 JDW Performed at [facility], [address], [place], [place] [address]    Culture STAPHYLOCOCCUS AUREUS (A)    Report Status PENDING   Respiratory Panel by RT PCR (Flu A&B, Covid) - Nasopharyngeal Swab     Status: None   Collection Time: 03/13/19 12:11 AM   Specimen: Nasopharyngeal Swab  Result Value Ref Range   SARS Coronavirus 2 by RT PCR NEGATIVE NEGATIVE    Comment: (NOTE) SARS-CoV-2 target nucleic acids are NOT DETECTED. The SARS-CoV-2 RNA is generally detectable in upper respiratoy specimens during the acute phase of infection. The lowest concentration of SARS-CoV-2 viral copies this assay can detect is 131 copies/mL. A negative result does not preclude SARS-Cov-2 infection and should not be used as the sole basis for treatment or other patient management decisions. A negative result may occur with  improper specimen collection/handling, submission of specimen other than nasopharyngeal swab, presence of viral mutation(s) within the areas targeted by this assay, and inadequate number of viral copies (<131 copies/mL). A negative result must be combined with clinical observations, patient history, and epidemiological information. The expected result is Negative. Fact Sheet for Patients:  https://www.moore.com/ Fact Sheet for Healthcare Providers:  https://www.young.biz/ This test is not yet ap proved or cleared by the [place] FDA and  has been authorized for detection and/or diagnosis of SARS-CoV-2 by FDA under an Emergency Use Authorization (EUA).  This EUA will remain  in effect (meaning this test can be used) for the duration of the COVID-19 declaration under Section 564(b)(1) of the Act, 21 U.S.C. section 360bbb-3(b)(1), unless the authorization is terminated or revoked sooner.    Influenza A by PCR NEGATIVE NEGATIVE   Influenza B by PCR NEGATIVE NEGATIVE    Comment: (NOTE) The Xpert Xpress SARS-CoV-2/FLU/RSV assay is intended as an aid in  the diagnosis of influenza from Nasopharyngeal swab specimens and  should not be used as a sole basis for treatment. Nasal washings and  aspirates are unacceptable for Xpert Xpress SARS-CoV-2/FLU/RSV  testing. Fact Sheet for Patients: https://www.moore.com/ Fact Sheet for Healthcare Providers: https://www.young.biz/ This test is not yet approved or cleared by the [place] FDA and  has been authorized for detection and/or diagnosis of SARS-CoV-2 by  FDA under an Emergency Use Authorization (EUA). This EUA will remain  in effect (meaning this test can be used) for the duration of the  Covid-19 declaration under Section 564(b)(1) of the Act, 21  U.S.C. section 360bbb-3(b)(1), unless the authorization is  terminated or revoked. Performed at [facility], [address], [place], [place] [address]   Lactic acid, plasma     Status: None   Collection Time: 03/13/19 12:11 AM  Result Value Ref Range   Lactic Acid, Venous 1.7 0.5 - 1.9 mmol/L    Comment: Performed at [facility], [address] [place], [place] [address]  Troponin I (High Sensitivity)     Status: Abnormal   Collection Time: 03/13/19 12:35 AM  Result Value Ref Range   Troponin I (High Sensitivity) 81 (H) <18 ng/L    Comment: RESULT CALLED TO, READ BACK BY AND VERIFIED WITH: PEICKERT P,RN 03/13/19 0143 WAYK Performed at [facility], [address] [place], [place] [address]   Blood culture (routine x 2)     Status: Abnormal (Preliminary result)   Collection  Time: 03/13/19 12:36 AM   Specimen: BLOOD  Result Value Ref Range   Specimen Description BLOOD RIGHT HAND    Special Requests      BOTTLES DRAWN AEROBIC AND ANAEROBIC Blood Culture results may not be optimal due to an inadequate volume of blood received in culture bottles   Culture  Setup Time      GRAM POSITIVE COCCI IN CLUSTERS ANAEROBIC BOTTLE ONLY CRITICAL RESULT CALLED TO, READ BACK BY AND VERIFIED WITH: [facility] 03/13/19 1829 JDW Performed at [facility], [address], [place], [place] [address]    Culture STAPHYLOCOCCUS AUREUS (A)    Report Status PENDING   Blood Culture ID Panel (Reflexed)     Status: Abnormal   Collection Time: 03/13/19 12:36 AM  Result Value Ref Range   Enterococcus species NOT DETECTED NOT DETECTED   Listeria monocytogenes NOT DETECTED NOT DETECTED   Staphylococcus species DETECTED (A) NOT DETECTED    Comment: CRITICAL RESULT CALLED TO, READ BACK BY AND VERIFIED WITH: [facility] 03/13/19 1829 JDW    Staphylococcus aureus (BCID) DETECTED (A) NOT DETECTED    Comment: Methicillin (oxacillin) susceptible Staphylococcus aureus (MSSA). Preferred therapy is anti staphylococcal beta lactam antibiotic (Cefazolin or Nafcillin), unless clinically contraindicated. CRITICAL RESULT CALLED TO, READ BACK BY AND VERIFIED WITH: [facility] 03/13/19 1829 JDW    Methicillin resistance NOT DETECTED NOT DETECTED   Streptococcus species NOT DETECTED NOT DETECTED   Streptococcus agalactiae NOT DETECTED NOT DETECTED   Streptococcus pneumoniae NOT DETECTED NOT DETECTED   Streptococcus pyogenes NOT DETECTED NOT DETECTED   Acinetobacter baumannii NOT DETECTED NOT DETECTED   Enterobacteriaceae species NOT DETECTED NOT DETECTED   Enterobacter cloacae complex NOT DETECTED NOT DETECTED   Escherichia coli NOT DETECTED NOT DETECTED   Klebsiella oxytoca NOT DETECTED NOT DETECTED   Klebsiella pneumoniae NOT DETECTED NOT DETECTED   Proteus species NOT DETECTED NOT  DETECTED   Serratia marcescens NOT DETECTED NOT DETECTED   Haemophilus influenzae NOT DETECTED NOT DETECTED   Neisseria meningitidis NOT DETECTED NOT DETECTED   Pseudomonas aeruginosa NOT DETECTED NOT DETECTED   Candida albicans NOT DETECTED NOT DETECTED   Candida glabrata NOT DETECTED NOT DETECTED   Candida krusei NOT DETECTED NOT DETECTED   Candida parapsilosis NOT DETECTED NOT DETECTED   Candida tropicalis NOT DETECTED NOT DETECTED    Comment: Performed at [facility], [address], [place], [place] [address]  CBG monitoring, ED     Status: Abnormal   Collection Time: 03/13/19  3:40 AM  Result Value Ref Range   Glucose-Capillary 334 (H) 70 - 99 mg/dL  Urinalysis, Routine w reflex microscopic     Status: Abnormal   Collection Time: 03/13/19  3:49 AM  Result Value Ref Range   Color, Urine YELLOW YELLOW   APPearance HAZY (A) CLEAR   Specific Gravity, Urine 1.021 1.005 - 1.030   pH 5.0 5.0 - 8.0   Glucose, UA >=500 (A) NEGATIVE mg/dL   Hgb urine dipstick LARGE (A) NEGATIVE   Bilirubin Urine NEGATIVE NEGATIVE   Ketones, ur 5 (A) NEGATIVE mg/dL   Protein, ur 621 (A) NEGATIVE mg/dL   Nitrite NEGATIVE NEGATIVE   Leukocytes,Ua NEGATIVE NEGATIVE   RBC / HPF 0-5 0 - 5 RBC/hpf   WBC, UA 6-10 0 -  5 WBC/hpf   Bacteria, UA RARE (A) NONE SEEN   Squamous Epithelial / LPF 0-5 0 - 5   Mucus PRESENT     Comment: Performed at [facility], [address] [place], [place] [address]  Urine culture     Status: None   Collection Time: 03/13/19  3:52 AM   Specimen: Urine, Clean Catch  Result Value Ref Range   Specimen Description URINE, CLEAN CATCH    Special Requests NONE    Culture      NO GROWTH Performed at [facility], [address] [place], [place] [address]    Report Status 03/13/2019 FINAL   Hemoglobin A1c     Status: Abnormal   Collection Time: 03/13/19  6:52 AM  Result Value Ref Range   Hgb A1c MFr Bld 8.8 (H) 4.8 - 5.6 %    Comment: (NOTE) Pre diabetes:           5.7%-6.4% Diabetes:              >6.4% Glycemic control for   <7.0% adults with diabetes    Mean Plasma Glucose 205.86 mg/dL    Comment: Performed at [facility], [address] [place], [place] [address]  HIV Antibody (routine testing w rflx)     Status: None   Collection Time: 03/13/19  6:52 AM  Result Value Ref Range   HIV Screen 4th Generation wRfx NON REACTIVE NON REACTIVE    Comment: Performed at [facility], [address] [place], [place] [address]  Cortisol-am, blood     Status: None   Collection Time: 03/13/19  6:52 AM  Result Value Ref Range   Cortisol - AM 22.1 6.7 - 22.6 ug/dL    Comment: Performed at [facility], [address] [place], [place] [address]  Procalcitonin     Status: None   Collection Time: 03/13/19  6:52 AM  Result Value Ref Range   Procalcitonin 3.07 ng/mL    Comment:        Interpretation: PCT > 2 ng/mL: Systemic infection (sepsis) is likely, unless other causes are known. (NOTE)       Sepsis PCT Algorithm           Lower Respiratory Tract                                      Infection PCT Algorithm    ----------------------------     ----------------------------         PCT < 0.25 ng/mL                PCT < 0.10 ng/mL         Strongly encourage             Strongly discourage   discontinuation of antibiotics    initiation of antibiotics    ----------------------------     -----------------------------       PCT 0.25 - 0.50 ng/mL            PCT 0.10 - 0.25 ng/mL               OR       >80% decrease in PCT            Discourage initiation of  antibiotics      Encourage discontinuation           of antibiotics    ----------------------------     -----------------------------         PCT >= 0.50 ng/mL              PCT 0.26 - 0.50 ng/mL               AND       <80% decrease in PCT              Encourage initiation of                                              antibiotics       Encourage continuation           of antibiotics    ----------------------------     -----------------------------        PCT >= 0.50 ng/mL                  PCT > 0.50 ng/mL               AND         increase in PCT                  Strongly encourage                                      initiation of antibiotics    Strongly encourage escalation           of antibiotics                                     -----------------------------                                           PCT <= 0.25 ng/mL                                                 OR                                        > 80% decrease in PCT                                     Discontinue / Do not initiate                                             antibiotics Performed at [facility], [address], [place], [place] [address]   Comprehensive metabolic panel     Status: Abnormal   Collection Time:  03/13/19  6:52 AM  Result Value Ref Range   Sodium 131 (L) 135 - 145 mmol/L   Potassium 3.6 3.5 - 5.1 mmol/L   Chloride 96 (L) 98 - 111 mmol/L   CO2 24 22 - 32 mmol/L   Glucose, Bld 320 (H) 70 - 99 mg/dL   BUN 29 (H) 6 - 20 mg/dL   Creatinine, Ser 2.59 (H) 0.61 - 1.24 mg/dL   Calcium 8.6 (L) 8.9 - 10.3 mg/dL   Total Protein 6.9 6.5 - 8.1 g/dL   Albumin 2.7 (L) 3.5 - 5.0 g/dL   AST 32 15 - 41 U/L   ALT 25 0 - 44 U/L   Alkaline Phosphatase 82 38 - 126 U/L   Total Bilirubin 1.3 (H) 0.3 - 1.2 mg/dL   GFR calc non Af Amer 37 (L) >60 mL/min   GFR calc Af Amer 43 (L) >60 mL/min   Anion gap 11 5 - 15    Comment: Performed at [facility], [address] [place], [place] [address]  Magnesium     Status: None   Collection Time: 03/13/19  6:52 AM  Result Value Ref Range   Magnesium 1.9 1.7 - 2.4 mg/dL    Comment: Performed at [facility], [address] [place], [place] [address]  CBC     Status: None   Collection Time: 03/13/19  6:52 AM  Result Value Ref Range   WBC 10.2 4.0 - 10.5  K/uL   RBC 4.90 4.22 - 5.81 MIL/uL   Hemoglobin 14.1 13.0 - 17.0 g/dL   HCT 29.5 18.8 - 41.6 %   MCV 86.3 80.0 - 100.0 fL   MCH 28.8 26.0 - 34.0 pg   MCHC 33.3 30.0 - 36.0 g/dL   RDW 60.6 30.1 - 60.1 %   Platelets 246 150 - 400 K/uL   nRBC 0.0 0.0 - 0.2 %    Comment: Performed at [facility], [address], [place], [place] [address]  Protime-INR     Status: None   Collection Time: 03/13/19  8:02 AM  Result Value Ref Range   Prothrombin Time 13.5 11.4 - 15.2 seconds   INR 1.0 0.8 - 1.2    Comment: (NOTE) INR goal varies based on device and disease states. Performed at [facility], [address] [place], [place] [address]   Glucose, capillary     Status: Abnormal   Collection Time: 03/13/19  9:06 AM  Result Value Ref Range   Glucose-Capillary 234 (H) 70 - 99 mg/dL  Glucose, capillary     Status: Abnormal   Collection Time: 03/13/19 11:43 AM  Result Value Ref Range   Glucose-Capillary 189 (H) 70 - 99 mg/dL  Glucose, capillary     Status: Abnormal   Collection Time: 03/13/19  4:06 PM  Result Value Ref Range   Glucose-Capillary 129 (H) 70 - 99 mg/dL  Glucose, capillary     Status: Abnormal   Collection Time: 03/13/19  8:31 PM  Result Value Ref Range   Glucose-Capillary 227 (H) 70 - 99 mg/dL  Surgical pcr screen     Status: None   Collection Time: 03/13/19 10:31 PM   Specimen: Nasal Mucosa; Nasal Swab  Result Value Ref Range   MRSA, PCR NEGATIVE NEGATIVE   Staphylococcus aureus NEGATIVE NEGATIVE    Comment: (NOTE) The Xpert SA Assay (FDA approved for NASAL specimens in patients 47 years of age and older), is one component of  a comprehensive surveillance program. It is not intended to diagnose infection nor to guide or monitor treatment. Performed at [facility], [address], [place], [place] [address]   Glucose, capillary     Status: Abnormal   Collection Time: 03/13/19 11:44 PM  Result Value Ref Range   Glucose-Capillary 145 (H) 70 - 99  mg/dL   Comment 1 Notify RN    Comment 2 Document in Chart   Glucose, capillary     Status: Abnormal   Collection Time: 03/14/19  4:09 AM  Result Value Ref Range   Glucose-Capillary 134 (H) 70 - 99 mg/dL  Comprehensive metabolic panel     Status: Abnormal   Collection Time: 03/14/19  4:51 AM  Result Value Ref Range   Sodium 134 (L) 135 - 145 mmol/L   Potassium 3.7 3.5 - 5.1 mmol/L   Chloride 98 98 - 111 mmol/L   CO2 26 22 - 32 mmol/L   Glucose, Bld 149 (H) 70 - 99 mg/dL   BUN 21 (H) 6 - 20 mg/dL   Creatinine, Ser 8.09 (H) 0.61 - 1.24 mg/dL   Calcium 8.2 (L) 8.9 - 10.3 mg/dL   Total Protein 5.9 (L) 6.5 - 8.1 g/dL   Albumin 2.2 (L) 3.5 - 5.0 g/dL   AST 71 (H) 15 - 41 U/L   ALT 39 0 - 44 U/L   Alkaline Phosphatase 84 38 - 126 U/L   Total Bilirubin 1.2 0.3 - 1.2 mg/dL   GFR calc non Af Amer 45 (L) >60 mL/min   GFR calc Af Amer 53 (L) >60 mL/min   Anion gap 10 5 - 15    Comment: Performed at [facility], [address], [place], [place] [address]  Magnesium     Status: None   Collection Time: 03/14/19  4:51 AM  Result Value Ref Range   Magnesium 1.7 1.7 - 2.4 mg/dL    Comment: Performed at [facility], [address], [place], [place] [address]  CBC     Status: Abnormal   Collection Time: 03/14/19  4:51 AM  Result Value Ref Range   WBC 6.4 4.0 - 10.5 K/uL   RBC 4.41 4.22 - 5.81 MIL/uL   Hemoglobin 12.7 (L) 13.0 - 17.0 g/dL   HCT 97.6 (L) 73.4 - 19.3 %   MCV 86.6 80.0 - 100.0 fL   MCH 28.8 26.0 - 34.0 pg   MCHC 33.2 30.0 - 36.0 g/dL   RDW 79.0 24.0 - 97.3 %   Platelets 202 150 - 400 K/uL   nRBC 0.0 0.0 - 0.2 %    Comment: Performed at [facility], [address], [place], [place] [address]  Glucose, capillary     Status: Abnormal   Collection Time: 03/14/19  7:26 AM  Result Value Ref Range   Glucose-Capillary 152 (H) 70 - 99 mg/dL   Comment 1 Notify RN    Comment 2 Document in Chart   Glucose, capillary     Status: Abnormal   Collection Time:  03/14/19  8:01 AM  Result Value Ref Range   Glucose-Capillary 151 (H) 70 - 99 mg/dL  Glucose, capillary     Status: Abnormal   Collection Time: 03/14/19 10:17 AM  Result Value Ref Range   Glucose-Capillary 154 (H) 70 - 99 mg/dL  Glucose, capillary     Status: Abnormal   Collection Time: 03/14/19 11:51 AM  Result Value Ref Range   Glucose-Capillary  181 (H) 70 - 99 mg/dL   Comment 1 Notify RN    Comment 2 Document in Chart     MICRO: 1/29 blood cx MSSA  1/30 tissue cx pending IMAGING: DG Chest Portable 1 View  Result Date: 03/12/2019 CLINICAL DATA:  Shortness of breath, fever, cough EXAM: PORTABLE CHEST 1 VIEW COMPARISON:  CT 11/30/2015, radiograph 09/26/2015 FINDINGS: Accounting for some atelectasis in the bases and patient body habitus, the lungs are clear. No consolidation, features of edema, pneumothorax, or effusion. The cardiomediastinal contours are unremarkable. No acute osseous or soft tissue abnormality. IMPRESSION: Accounting for body habitus, the lungs are clear. Electronically Signed   By: Kreg Shropshire M.D.   On: 03/12/2019 23:47   DG Foot 2 Views Right  Result Date: 03/14/2019 CLINICAL DATA:  Post right foot surgery. 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 Evidence of recent fifth toe amputation including the metatarsal and phalanges. There is a well-defined horizontal lucency over the base of the fourth metatarsal likely postsurgical. Postsurgical soft tissue changes over the lateral mid to forefoot. Remainder the exam is unchanged. IMPRESSION: 1. Postsurgical changes compatible recent fifth toe amputation. Smooth well-defined horizontal lucency over the base of the fourth metatarsal also likely postsurgical. 2.  Stable postsurgical changes over the first and second phalanges. Electronically Signed   By: Elberta Fortis M.D.   On: 03/14/2019 11:33      Assessment/Plan: 59yo M who presented with sepsis like picture from MSSA bacteremia likely secondary to DFU/osteo/cellulitis. He is PD#0 from amputation and I x D (serves as source control), leukocytosis improving  - recommend to discontinue vancomycin and can continue with piptazo for now (to still mop up polymicrobial dfu and cover MSSA) - will need repeat blood cx tomorrow to ensure clearance of bacteremia - he will need TEE to ensure that he does not have endocarditis and can treat for shorter course of iv abtx - he will need IV abtx for minimum of 2wks possibly longer.   Osteo - if there is thought to still ongoing residual infection, would side on treating with 4 wk of abtx.  Duke Salvia Drue Second MD MPH [facility] [phone]

## 2019-03-14 NOTE — Progress Notes (Signed)
   Vital Signs MEWS/VS Documentation      03/14/2019 1523 03/14/2019 1631 03/14/2019 1900 03/14/2019 2037   MEWS Score:  1  1  1  2    MEWS Score Color:   Green  Green  Yellow   Resp:  --  (!) 21  --  20   Pulse:  --  99  --  91   BP:  --  140/74  --  126/77   Temp:  99.5 F (37.5 C)  (!) 100.4 F (38 C)  --  (!) 102.1 F (38.9 C)   O2 Device:  --  Room Air  --  Room Air       Gave PRN medication, will repeat temp. measurement.    Chilton Si 03/14/2019,10:34 PM

## 2019-03-14 NOTE — Brief Op Note (Signed)
03/14/2019  10:13 AM  PATIENT:  [name]  59 y.o. male  PRE-OPERATIVE DIAGNOSIS:  Cellulitis and Abscess and osteomyelitis Right 5th toe/metatasal  POST-OPERATIVE DIAGNOSIS:  Cellulitis and Abscess and osteomyelitis Right 5th toe/metatasal  PROCEDURE:  Procedure(s): INCISION AND DRAINAGE (Right) AMPUTATION TOE, 5th toe (Right) Distal 5th Metatarsal Amputation, Right foot  SURGEON:  Surgeon(s) and Role:    Landis Martins, DPM - Primary  PHYSICIAN ASSISTANT: None  ASSISTANTS: None  ANESTHESIA:   MAC with local  Pre and post op 20cc of lidocaine and marcaine plain   EBL: Minimal   BLOOD ADMINISTERED:None  DRAINS:None  LOCAL MEDICATIONS USED: Lidocaine and Marcaine  SPECIMEN:  Right 5th toe and bone-pathology Clean margin-pathology Wound culture- Micro   DISPOSITION OF SPECIMEN:  PATHOLOGY  COUNTS:  YES  TOURNIQUET:   Total Tourniquet Time Documented: Calf (Right) - 55 minutes Total: Calf (Right) - 55 minutes   DICTATION: .Note written in EPIC  PLAN OF CARE: Transfer back to [place]:  PACU - hemodynamically stable.   Delay start of Pharmacological VTE agent (>24hrs) due to surgical blood loss or risk of bleeding: no

## 2019-03-14 NOTE — Progress Notes (Signed)
[facility]                                                                              Patient Demographics  [name], is a 59 y.o. male, DOB - 12-18-1960, WIO:[id number]  Admit date - 03/12/2019   Admitting Physician Assunta Found, MD  Outpatient Primary MD for the patient is Josetta Huddle, MD  Outpatient specialists:   LOS - 1  days    Chief Complaint  Patient presents with  . SOB/Fever/Cough       Brief summary   This is a 59 year old male with history of morbid obesity, HLD, HTN, DM 2, right foot osteomyelitis supposed to have surgery Monday, came with chief complaints of fever, generalized weakness, and also has noted his right foot has become more red.  He was found to be septic with fever, elevated white count, likely in the setting of right foot osteomyelitis/cellulitis    Assessment & Plan    Principal Problem:   Acute sepsis ([place]) Active Problems:   OBESITY   Obesity, diabetes, and hypertension syndrome ([facility])   Obstructive sleep apnea   Diabetes mellitus ([place])   Hyperlipidemia   GERD (gastroesophageal reflux disease)   Osteomyelitis of fifth toe of right foot ([place])   DKA (diabetic ketoacidosis) ([place])   Sepsis due to right foot cellulitis/underlying osteomyelitis -He follows with Dr. Cannon Kettle as an outpatient, underwent an MRI in [place] which showed osteomyelitis and had surgery right foot , 03/14/2019, and recc  patient home on PO antibiotics after intra-op culture results are available with follow up in office for wound/dressing care.   DM2, hyperglycemia -Continue Lantus, sliding scale  OSA -Nightly CPAP  Acute kidney injury -Continue IV fluids, avoid nephrotoxic agents  Elevated troponin -Secondary to demand ischemia, no chest pain, not in a pattern consistent with ACS  Essential hypertension -Hold home medications in the setting of sepsis  Hyperlipidemia -Continue statin  Morbid obesity with BMI greater than  40 -Patient will benefit from weight loss       Code Status: Full code DVT Prophylaxis:  Lovenox Family Communication: Discussed in detail with the patient, all imaging results, lab results explained to the patient   Disposition Plan: Home  Time Spent in minutes 35  minutes  Procedures:  right foot incision and drainage with 5th toe and distal metatarsal amputation   Consultants:   *Podiatry  Antimicrobials:   zosyn   Medications  Scheduled Meds: . aspirin EC  81 mg Oral Daily  . cholecalciferol  1,000 Units Oral Daily  . enoxaparin (LOVENOX) injection  40 mg Subcutaneous Daily  . insulin aspart  0-20 Units Subcutaneous Q4H  . montelukast  10 mg Oral QHS  . pantoprazole  40 mg Oral BID  . simvastatin  40 mg Oral QPM  . sodium chloride flush  3 mL Intravenous Q12H  . vitamin B-12  1,000 mcg Oral Daily   Continuous Infusions: . sodium chloride 125 mL/hr at 03/14/19 1212  . sodium chloride    . lactated ringers 10 mL/hr at 03/14/19 0833  . piperacillin-tazobactam (ZOSYN)  IV 3.375 g (03/14/19 0631)   PRN  Meds:.sodium chloride, acetaminophen **OR** acetaminophen, hydrALAZINE, ondansetron **OR** ondansetron (ZOFRAN) IV, sodium chloride flush   Antibiotics   Anti-infectives (From admission, onward)   Start     Dose/Rate Route Frequency Ordered Stop   03/13/19 1800  vancomycin (VANCOCIN) IVPB 1000 mg/200 mL premix  Status:  Discontinued     1,000 mg 200 mL/hr over 60 Minutes Intravenous Every 12 hours 03/13/19 0434 03/13/19 1907   03/13/19 0600  piperacillin-tazobactam (ZOSYN) IVPB 3.375 g     3.375 g 12.5 mL/hr over 240 Minutes Intravenous Every 8 hours 03/13/19 0427     03/13/19 0430  vancomycin (VANCOCIN) IVPB 1000 mg/200 mL premix     1,000 mg 200 mL/hr over 60 Minutes Intravenous  Once 03/13/19 0427 03/13/19 0657   03/13/19 0000  vancomycin (VANCOCIN) IVPB 1000 mg/200 mL premix     1,000 mg 200 mL/hr over 60 Minutes Intravenous  Once 03/12/19 2356  03/13/19 0140   03/13/19 0000  piperacillin-tazobactam (ZOSYN) IVPB 3.375 g     3.375 g 100 mL/hr over 30 Minutes Intravenous  Once 03/12/19 2356 03/13/19 0110        Subjective:   [name] was seen and examined today.  Patient denies dizziness, chest pain, shortness of breath, abdominal pain, N/V/D/C, new weakness, numbess, tingling. No acute events overnight.    Objective:   Vitals:   03/14/19 0729 03/14/19 1015 03/14/19 1030 03/14/19 1154  BP: 140/85 133/78 130/77 119/61  Pulse: 90 88 83 82  Resp: (!) 21 (!) 22 (!) 29 (!) 21  Temp: 99.6 F (37.6 C) (!) 97.4 F (36.3 C) 98.1 F (36.7 C) 98.5 F (36.9 C)  TempSrc: Oral   Oral  SpO2: 96% 98% 98% 98%  Weight:      Height:        Intake/Output Summary (Last 24 hours) at 03/14/2019 1338 Last data filed at 03/14/2019 1015 Gross per 24 hour  Intake 2799.6 ml  Output 1170 ml  Net 1629.6 ml     Wt Readings from Last 3 Encounters:  03/14/19 (!) 142 kg  07/19/17 136.1 kg  12/11/16 (!) 142.5 kg     Exam  General: NAD  HEENT: NCAT,  PERRL,MMM  Neck: SUPPLE, (-) JVD  Cardiovascular: RRR, (-) GALLOP, (-) MURMUR  Respiratory: CTA  Gastrointestinal: SOFT, (-) DISTENSION, BS(+), (_) TENDERNESS  Ext: (-) CYANOSIS, left foot post op wound dressing noted  Neuro: A, OX 3   Psych:NORMAL AFFECT/MOOD   Data Reviewed:  I have personally reviewed following labs and imaging studies  Micro Results Recent Results (from the past 240 hour(s))  Blood culture (routine x 2)     Status: Abnormal (Preliminary result)   Collection Time: 03/13/19 12:11 AM   Specimen: BLOOD  Result Value Ref Range Status   Specimen Description BLOOD LEFT FOREARM  Final   Special Requests   Final    BOTTLES DRAWN AEROBIC AND ANAEROBIC Blood Culture adequate volume   Culture  Setup Time   Final    GRAM POSITIVE COCCI IN CLUSTERS IN BOTH AEROBIC AND ANAEROBIC BOTTLES CRITICAL RESULT CALLED TO, READ BACK BY AND VERIFIED WITH: Lytle Butte  [facility] 03/13/19 1829 JDW Performed at [facility], [address], [place], [place] [address]    Culture STAPHYLOCOCCUS AUREUS (A)  Final   Report Status PENDING  Incomplete  Respiratory Panel by RT PCR (Flu A&B, Covid) - Nasopharyngeal Swab     Status: None   Collection Time: 03/13/19 12:11 AM   Specimen:  Nasopharyngeal Swab  Result Value Ref Range Status   SARS Coronavirus 2 by RT PCR NEGATIVE NEGATIVE Final    Comment: (NOTE) SARS-CoV-2 target nucleic acids are NOT DETECTED. The SARS-CoV-2 RNA is generally detectable in upper respiratoy specimens during the acute phase of infection. The lowest concentration of SARS-CoV-2 viral copies this assay can detect is 131 copies/mL. A negative result does not preclude SARS-Cov-2 infection and should not be used as the sole basis for treatment or other patient management decisions. A negative result may occur with  improper specimen collection/handling, submission of specimen other than nasopharyngeal swab, presence of viral mutation(s) within the areas targeted by this assay, and inadequate number of viral copies (<131 copies/mL). A negative result must be combined with clinical observations, patient history, and epidemiological information. The expected result is Negative. Fact Sheet for Patients:  https://www.moore.com/ Fact Sheet for Healthcare Providers:  https://www.young.biz/ This test is not yet ap proved or cleared by the [place] FDA and  has been authorized for detection and/or diagnosis of SARS-CoV-2 by FDA under an Emergency Use Authorization (EUA). This EUA will remain  in effect (meaning this test can be used) for the duration of the COVID-19 declaration under Section 564(b)(1) of the Act, 21 U.S.C. section 360bbb-3(b)(1), unless the authorization is terminated or revoked sooner.    Influenza A by PCR NEGATIVE NEGATIVE Final   Influenza B by PCR NEGATIVE NEGATIVE Final     Comment: (NOTE) The Xpert Xpress SARS-CoV-2/FLU/RSV assay is intended as an aid in  the diagnosis of influenza from Nasopharyngeal swab specimens and  should not be used as a sole basis for treatment. Nasal washings and  aspirates are unacceptable for Xpert Xpress SARS-CoV-2/FLU/RSV  testing. Fact Sheet for Patients: https://www.moore.com/ Fact Sheet for Healthcare Providers: https://www.young.biz/ This test is not yet approved or cleared by the [place] FDA and  has been authorized for detection and/or diagnosis of SARS-CoV-2 by  FDA under an Emergency Use Authorization (EUA). This EUA will remain  in effect (meaning this test can be used) for the duration of the  Covid-19 declaration under Section 564(b)(1) of the Act, 21  U.S.C. section 360bbb-3(b)(1), unless the authorization is  terminated or revoked. Performed at [facility], [address], [place], [place] [address]   Blood culture (routine x 2)     Status: Abnormal (Preliminary result)   Collection Time: 03/13/19 12:36 AM   Specimen: BLOOD  Result Value Ref Range Status   Specimen Description BLOOD RIGHT HAND  Final   Special Requests   Final    BOTTLES DRAWN AEROBIC AND ANAEROBIC Blood Culture results may not be optimal due to an inadequate volume of blood received in culture bottles   Culture  Setup Time   Final    GRAM POSITIVE COCCI IN CLUSTERS ANAEROBIC BOTTLE ONLY CRITICAL RESULT CALLED TO, READ BACK BY AND VERIFIED WITH: Lytle Butte [facility] 03/13/19 1829 JDW Performed at [facility], [address], [place], [place] [address]    Culture STAPHYLOCOCCUS AUREUS (A)  Final   Report Status PENDING  Incomplete  Blood Culture ID Panel (Reflexed)     Status: Abnormal   Collection Time: 03/13/19 12:36 AM  Result Value Ref Range Status   Enterococcus species NOT DETECTED NOT DETECTED Final   Listeria monocytogenes NOT DETECTED NOT DETECTED Final   Staphylococcus species  DETECTED (A) NOT DETECTED Final    Comment: CRITICAL RESULT CALLED TO, READ BACK BY AND VERIFIED WITH: Lytle Butte PHARMD  03/13/19 1829 JDW    Staphylococcus aureus (BCID) DETECTED (A) NOT DETECTED Final    Comment: Methicillin (oxacillin) susceptible Staphylococcus aureus (MSSA). Preferred therapy is anti staphylococcal beta lactam antibiotic (Cefazolin or Nafcillin), unless clinically contraindicated. CRITICAL RESULT CALLED TO, READ BACK BY AND VERIFIED WITH: Lytle Butte [facility] 03/13/19 1829 JDW    Methicillin resistance NOT DETECTED NOT DETECTED Final   Streptococcus species NOT DETECTED NOT DETECTED Final   Streptococcus agalactiae NOT DETECTED NOT DETECTED Final   Streptococcus pneumoniae NOT DETECTED NOT DETECTED Final   Streptococcus pyogenes NOT DETECTED NOT DETECTED Final   Acinetobacter baumannii NOT DETECTED NOT DETECTED Final   Enterobacteriaceae species NOT DETECTED NOT DETECTED Final   Enterobacter cloacae complex NOT DETECTED NOT DETECTED Final   Escherichia coli NOT DETECTED NOT DETECTED Final   Klebsiella oxytoca NOT DETECTED NOT DETECTED Final   Klebsiella pneumoniae NOT DETECTED NOT DETECTED Final   Proteus species NOT DETECTED NOT DETECTED Final   Serratia marcescens NOT DETECTED NOT DETECTED Final   Haemophilus influenzae NOT DETECTED NOT DETECTED Final   Neisseria meningitidis NOT DETECTED NOT DETECTED Final   Pseudomonas aeruginosa NOT DETECTED NOT DETECTED Final   Candida albicans NOT DETECTED NOT DETECTED Final   Candida glabrata NOT DETECTED NOT DETECTED Final   Candida krusei NOT DETECTED NOT DETECTED Final   Candida parapsilosis NOT DETECTED NOT DETECTED Final   Candida tropicalis NOT DETECTED NOT DETECTED Final    Comment: Performed at [facility], [address] [place], [place] [address]  Urine culture     Status: None   Collection Time: 03/13/19  3:52 AM   Specimen: Urine, Clean Catch  Result Value Ref Range Status   Specimen Description URINE, CLEAN  CATCH  Final   Special Requests NONE  Final   Culture   Final    NO GROWTH Performed at [facility], [address], [place], [place] [address]    Report Status 03/13/2019 FINAL  Final  Surgical pcr screen     Status: None   Collection Time: 03/13/19 10:31 PM   Specimen: Nasal Mucosa; Nasal Swab  Result Value Ref Range Status   MRSA, PCR NEGATIVE NEGATIVE Final   Staphylococcus aureus NEGATIVE NEGATIVE Final    Comment: (NOTE) The Xpert SA Assay (FDA approved for NASAL specimens in patients 46 years of age and older), is one component of a comprehensive surveillance program. It is not intended to diagnose infection nor to guide or monitor treatment. Performed at [facility], [address], [place], [place] [address]     Radiology Reports DG Chest Portable 1 View  Result Date: 03/12/2019 CLINICAL DATA:  Shortness of breath, fever, cough EXAM: PORTABLE CHEST 1 VIEW COMPARISON:  CT 11/30/2015, radiograph 09/26/2015 FINDINGS: Accounting for some atelectasis in the bases and patient body habitus, the lungs are clear. No consolidation, features of edema, pneumothorax, or effusion. The cardiomediastinal contours are unremarkable. No acute osseous or soft tissue abnormality. IMPRESSION: Accounting for body habitus, the lungs are clear. Electronically Signed   By: Kreg Shropshire M.D.   On: 03/12/2019 23:47   DG Foot 2 Views Right  Result Date: 03/14/2019 CLINICAL DATA:  Post right foot surgery. 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 Evidence of recent fifth toe amputation including the metatarsal and phalanges. There is a well-defined horizontal lucency over the base of the  fourth metatarsal likely postsurgical. Postsurgical soft tissue changes over the lateral mid to forefoot. Remainder the exam is unchanged. IMPRESSION: 1. Postsurgical changes  compatible recent fifth toe amputation. Smooth well-defined horizontal lucency over the base of the fourth metatarsal also likely postsurgical. 2.  Stable postsurgical changes over the first and second phalanges. Electronically Signed   By: Elberta Fortisaniel  Boyle M.D.   On: 03/14/2019 11:33    Lab Data:  CBC: Recent Labs  Lab 03/12/19 2243 03/13/19 0652 03/14/19 0451  WBC 15.5* 10.2 6.4  NEUTROABS 13.6*  --   --   HGB 14.5 14.1 12.7*  HCT 43.2 42.3 38.2*  MCV 86.2 86.3 86.6  PLT 269 246 202   Basic Metabolic Panel: Recent Labs  Lab 03/12/19 2243 03/13/19 0652 03/14/19 0451  NA 127* 131* 134*  K 4.0 3.6 3.7  CL 93* 96* 98  CO2 19* 24 26  GLUCOSE 356* 320* 149*  BUN 30* 29* 21*  CREATININE 1.83* 1.96* 1.66*  CALCIUM 8.8* 8.6* 8.2*  MG  --  1.9 1.7   GFR: Estimated Creatinine Clearance: 71.7 mL/min (A) (by C-G formula based on SCr of 1.66 mg/dL (H)). Liver Function Tests: Recent Labs  Lab 03/12/19 2243 03/13/19 0652 03/14/19 0451  AST 30 32 71*  ALT 24 25 39  ALKPHOS 79 82 84  BILITOT 1.3* 1.3* 1.2  PROT 7.2 6.9 5.9*  ALBUMIN 3.0* 2.7* 2.2*   No results for input(s): LIPASE, AMYLASE in the last 168 hours. No results for input(s): AMMONIA in the last 168 hours. Coagulation Profile: Recent Labs  Lab 03/13/19 0802  INR 1.0   Cardiac Enzymes: No results for input(s): CKTOTAL, CKMB, CKMBINDEX, TROPONINI in the last 168 hours. BNP (last 3 results) No results for input(s): PROBNP in the last 8760 hours. HbA1C: Recent Labs    03/13/19 0652  HGBA1C 8.8*   CBG: Recent Labs  Lab 03/14/19 0409 03/14/19 0726 03/14/19 0801 03/14/19 1017 03/14/19 1151  GLUCAP 134* 152* 151* 154* 181*   Lipid Profile: No results for input(s): CHOL, HDL, LDLCALC, TRIG, CHOLHDL, LDLDIRECT in the last 72 hours. Thyroid Function Tests: No results for input(s): TSH, T4TOTAL, FREET4, T3FREE, THYROIDAB in the last 72 hours. Anemia Panel: No results for input(s): VITAMINB12, FOLATE,  FERRITIN, TIBC, IRON, RETICCTPCT in the last 72 hours. Urine analysis:    Component Value Date/Time   COLORURINE YELLOW 03/13/2019 0349   APPEARANCEUR HAZY (A) 03/13/2019 0349   LABSPEC 1.021 03/13/2019 0349   PHURINE 5.0 03/13/2019 0349   GLUCOSEU >=500 (A) 03/13/2019 0349   HGBUR LARGE (A) 03/13/2019 0349   BILIRUBINUR NEGATIVE 03/13/2019 0349   KETONESUR 5 (A) 03/13/2019 0349   PROTEINUR 100 (A) 03/13/2019 0349   NITRITE NEGATIVE 03/13/2019 0349   LEUKOCYTESUR NEGATIVE 03/13/2019 0349     Jackie PlumGeorge Osei-Bonsu M.D. [facility] 03/14/2019, 1:38 PM  Pager: [phone] Between 7am to 7pm - call Pager - [phone]  After 7pm go to [email or web address] - password [email or web address]  Call night coverage person covering after 7pm

## 2019-03-14 NOTE — Transfer of Care (Signed)
Immediate Anesthesia Transfer of Care Note  Patient: [name]  Procedure(s) Performed: INCISION AND DRAINAGE (Right ) AMPUTATION TOE, 5th toe (Right Toe)  Patient Location: PACU  Anesthesia Type:MAC combined with regional for post-op pain  Level of Consciousness: awake, alert , oriented and patient cooperative  Airway & Oxygen Therapy: Patient Spontanous Breathing  Post-op Assessment: Report given to RN and Post -op Vital signs reviewed and stable  Post vital signs: Reviewed and stable  Last Vitals:  Vitals Value Taken Time  BP 133/78 03/14/19 1016  Temp    Pulse 86 03/14/19 1016  Resp 22 03/14/19 1016  SpO2 98 % 03/14/19 1016  Vitals shown include unvalidated device data.  Last Pain:  Vitals:   03/14/19 0743  TempSrc:   PainSc: 0-No pain      Patients Stated Pain Goal: 2 (16/24/46 9507)  Complications: No apparent anesthesia complications

## 2019-03-14 NOTE — Anesthesia Postprocedure Evaluation (Signed)
Anesthesia Post Note  Patient: RAD GRAMLING  Procedure(s) Performed: INCISION AND DRAINAGE (Right ) AMPUTATION TOE, 5th toe (Right Toe)     Patient location during evaluation: PACU Anesthesia Type: MAC Level of consciousness: awake and alert Pain management: pain level controlled Vital Signs Assessment: post-procedure vital signs reviewed and stable Respiratory status: spontaneous breathing, nonlabored ventilation and respiratory function stable Cardiovascular status: stable and blood pressure returned to baseline Postop Assessment: no apparent nausea or vomiting Anesthetic complications: no    Last Vitals:  Vitals:   03/14/19 1015 03/14/19 1030  BP: 133/78 130/77  Pulse: 88 83  Resp: (!) 22 (!) 29  Temp: (!) 36.3 C 36.7 C  SpO2: 98% 98%    Last Pain:  Vitals:   03/14/19 1015  TempSrc:   PainSc: 0-No pain                 Johnathon Mittal,W. EDMOND

## 2019-03-14 NOTE — Progress Notes (Signed)
Patient is able to place himself on CPAP when ready. CPAP is connected to 2L and has water in it for tonight.

## 2019-03-15 LAB — COMPREHENSIVE METABOLIC PANEL
ALT: 55 U/L — ABNORMAL HIGH (ref 0–44)
AST: 69 U/L — ABNORMAL HIGH (ref 15–41)
Albumin: 2 g/dL — ABNORMAL LOW (ref 3.5–5.0)
Alkaline Phosphatase: 103 U/L (ref 38–126)
Anion gap: 9 (ref 5–15)
BUN: 17 mg/dL (ref 6–20)
CO2: 25 mmol/L (ref 22–32)
Calcium: 7.9 mg/dL — ABNORMAL LOW (ref 8.9–10.3)
Chloride: 102 mmol/L (ref 98–111)
Creatinine, Ser: 1.56 mg/dL — ABNORMAL HIGH (ref 0.61–1.24)
GFR calc Af Amer: 57 mL/min — ABNORMAL LOW (ref >60)
GFR calc non Af Amer: 49 mL/min — ABNORMAL LOW (ref >60)
Glucose, Bld: 195 mg/dL — ABNORMAL HIGH (ref 70–99)
Potassium: 3.6 mmol/L (ref 3.5–5.1)
Sodium: 136 mmol/L (ref 135–145)
Total Bilirubin: 1 mg/dL (ref 0.3–1.2)
Total Protein: 5.4 g/dL — ABNORMAL LOW (ref 6.5–8.1)

## 2019-03-15 LAB — MAGNESIUM: Magnesium: 1.9 mg/dL (ref 1.7–2.4)

## 2019-03-15 LAB — CULTURE, BLOOD (ROUTINE X 2): Special Requests: ADEQUATE

## 2019-03-15 LAB — GLUCOSE, CAPILLARY
Glucose-Capillary: 190 mg/dL — ABNORMAL HIGH (ref 70–99)
Glucose-Capillary: 229 mg/dL — ABNORMAL HIGH (ref 70–99)
Glucose-Capillary: 241 mg/dL — ABNORMAL HIGH (ref 70–99)
Glucose-Capillary: 286 mg/dL — ABNORMAL HIGH (ref 70–99)

## 2019-03-15 LAB — CBC
HCT: 34.3 % — ABNORMAL LOW (ref 39.0–52.0)
Hemoglobin: 11.5 g/dL — ABNORMAL LOW (ref 13.0–17.0)
MCH: 28.8 pg (ref 26.0–34.0)
MCHC: 33.5 g/dL (ref 30.0–36.0)
MCV: 85.8 fL (ref 80.0–100.0)
Platelets: 165 10*3/uL (ref 150–400)
RBC: 4 MIL/uL — ABNORMAL LOW (ref 4.22–5.81)
RDW: 13.1 % (ref 11.5–15.5)
WBC: 6.7 10*3/uL (ref 4.0–10.5)
nRBC: 0 % (ref 0.0–0.2)

## 2019-03-15 MED ORDER — HYDROCORTISONE 1 % EX CREA
TOPICAL_CREAM | Freq: Three times a day (TID) | CUTANEOUS | Status: DC | PRN
  Filled 2019-03-15: qty 28

## 2019-03-15 MED ORDER — SENNOSIDES-DOCUSATE SODIUM 8.6-50 MG PO TABS: 1.0000 | ORAL_TABLET | Freq: Every evening | ORAL | Status: DC | PRN

## 2019-03-15 NOTE — Progress Notes (Addendum)
ID PROGRESS NOTE  59yo M with DFU/osteo of 5th MT s/p amputation but also admitted for sepsis found to have secondary MSSA bacteremia  Febrile up to 102F yesterday post surgery. Will get repeat blood cx today. If still febrile today, would warrant to look for other nidus of infection in the setting of staph aureas bacteremia. Await TEE results. Avoid placing central line until blood cx are NGTD.  Blood cx MSSA Tissue cx GNR, identification pending   A/P: continue with piptazo for the time being, await to see what identification for GNR, would like to narrow to cefazolin or nafcillin once results are back. He is showing improvement with leukocytosis trending down. If TEE is negative, we may still warrant doing 4 wk minimum of iv abtx given some changes noted on mri on 4th phalanges.  Dr Zenaida Niece dam to see tomorrow.

## 2019-03-15 NOTE — Progress Notes (Signed)
[facility]                                                                              Patient Demographics  [name], is a 59 y.o. male, DOB - February 06, 1961, ZOX:[id number]  Admit date - 03/12/2019   Admitting Physician Leona SingletonMircea G Cristescu, MD  Outpatient Primary MD for the patient is [name], Michaiah, MD  Outpatient specialists:   LOS - 2  days    Chief Complaint  Patient presents with  . SOB/Fever/Cough       Brief summary   This is a 59 year old male with history of morbid obesity, HLD, HTN, DM 2, right foot osteomyelitis supposed to have surgery Monday, came with chief complaints of fever, generalized weakness, and also has noted his right foot has become more red.  He was found to be septic with fever, elevated white count, likely in the setting of right foot osteomyelitis/cellulitis    Assessment & Plan    Principal Problem:   Acute sepsis (HCC) Active Problems:   OBESITY   Obesity, diabetes, and hypertension syndrome (HCC)   Obstructive sleep apnea   Diabetes mellitus (HCC)   Hyperlipidemia   GERD (gastroesophageal reflux disease)   Osteomyelitis of fifth toe of right foot (HCC)   DKA (diabetic ketoacidosis) (HCC)   Sepsis due to right foot cellulitis/underlying osteomyelitis -He follows with Dr. Marylene LandStover as an outpatient, underwent an MRI in [place] which showed osteomyelitis and had surgery right foot , 03/14/2019, and recc  patient home on PO antibiotics after intra-op culture results are available with follow up in office for wound/dressing care. -Seen by ID, sepsis like picture from MSSA bacteremia likely secondary to DFU/osteo/cellulitis, and recommend to discontinue vancomycin and can continue with piptazo for now (to still mop up polymicrobial dfu and cover MSSA, repeat blood Cx today to ensure clearance of bacteremia, as well as  TEE to ensure that he does not have endocarditis and can treat for shorter course of iv abtx - he will need IV  abtx for minimum of 2wks possibly longer.  Cardiology consulted- [facility] for TEE  DM2, hyperglycemia -Continue Lantus, sliding scale  OSA -Nightly CPAP  Acute kidney injury -Continue IV fluids, avoid nephrotoxic agents  Elevated troponin -Secondary to demand ischemia, no chest pain, not in a pattern consistent with ACS  Essential hypertension -Hold home medications in the setting of sepsis  Hyperlipidemia -Continue statin  Morbid obesity with BMI greater than 40 -Patient will benefit from weight loss       Code Status: Full code DVT Prophylaxis:  Lovenox Family Communication: Discussed in detail with the patient, all imaging results, lab results explained to the patient   Disposition Plan: Home  Time Spent in minutes 25  minutes  Procedures:  right foot incision and drainage with 5th toe and distal metatarsal amputation   Consultants:   *Podiatry  Antimicrobials:   zosyn   Medications  Scheduled Meds: . aspirin EC  81 mg Oral Daily  . cholecalciferol  1,000 Units Oral Daily  . enoxaparin (LOVENOX) injection  40 mg Subcutaneous Daily  . insulin aspart  0-20 Units Subcutaneous TID  AC & HS  . montelukast  10 mg Oral QHS  . pantoprazole  40 mg Oral BID  . simvastatin  40 mg Oral QPM  . sodium chloride flush  3 mL Intravenous Q12H  . vitamin B-12  1,000 mcg Oral Daily   Continuous Infusions: . sodium chloride 125 mL/hr at 03/15/19 0158  . sodium chloride 1,000 mL (03/15/19 0634)  . lactated ringers 10 mL/hr at 03/14/19 0833  . piperacillin-tazobactam (ZOSYN)  IV 3.375 g (03/15/19 0635)   PRN Meds:.sodium chloride, acetaminophen **OR** acetaminophen, guaiFENesin-codeine, hydrALAZINE, ondansetron **OR** ondansetron (ZOFRAN) IV, sodium chloride flush   Antibiotics   Anti-infectives (From admission, onward)   Start     Dose/Rate Route Frequency Ordered Stop   03/13/19 1800  vancomycin (VANCOCIN) IVPB 1000 mg/200 mL premix  Status:   Discontinued     1,000 mg 200 mL/hr over 60 Minutes Intravenous Every 12 hours 03/13/19 0434 03/13/19 1907   03/13/19 0600  piperacillin-tazobactam (ZOSYN) IVPB 3.375 g     3.375 g 12.5 mL/hr over 240 Minutes Intravenous Every 8 hours 03/13/19 0427     03/13/19 0430  vancomycin (VANCOCIN) IVPB 1000 mg/200 mL premix     1,000 mg 200 mL/hr over 60 Minutes Intravenous  Once 03/13/19 0427 03/13/19 0657   03/13/19 0000  vancomycin (VANCOCIN) IVPB 1000 mg/200 mL premix     1,000 mg 200 mL/hr over 60 Minutes Intravenous  Once 03/12/19 2356 03/13/19 0140   03/13/19 0000  piperacillin-tazobactam (ZOSYN) IVPB 3.375 g     3.375 g 100 mL/hr over 30 Minutes Intravenous  Once 03/12/19 2356 03/13/19 0110        Subjective:   [name] was seen and examined today.  Patient denies dizziness, chest pain, shortness of breath, abdominal pain,no fever or chills  Objective:   Vitals:   03/14/19 2037 03/14/19 2348 03/15/19 0434 03/15/19 0746  BP: 126/77 127/62 134/63 99/80  Pulse: 91 86 88 91  Resp: 20 18 20 20   Temp: (!) 102.1 F (38.9 C) 99.1 F (37.3 C) 98.9 F (37.2 C) 99.6 F (37.6 C)  TempSrc: Oral Oral Oral Oral  SpO2: 95% 95% 97% 92%  Weight:   (!) 145.9 kg   Height:        Intake/Output Summary (Last 24 hours) at 03/15/2019 1045 Last data filed at 03/15/2019 0800 Gross per 24 hour  Intake 2184.46 ml  Output 2200 ml  Net -15.54 ml     Wt Readings from Last 3 Encounters:  03/15/19 (!) 145.9 kg  07/19/17 136.1 kg  12/11/16 (!) 142.5 kg     Exam  General: NAD  HEENT: NCAT,  PERRL,MMM  Neck: SUPPLE, (-) JVD  Cardiovascular: RRR, (-) GALLOP, (-) MURMUR  Respiratory: CTA  Gastrointestinal: SOFT, (-) DISTENSION, BS(+), (_) TENDERNESS  Ext: (-) CYANOSIS, left foot post op wound dressing noted  Neuro: A, OX 3   Psych:NORMAL AFFECT/MOOD   Data Reviewed:  I have personally reviewed following labs and imaging studies  Micro Results Recent Results (from the  past 240 hour(s))  Blood culture (routine x 2)     Status: Abnormal   Collection Time: 03/13/19 12:11 AM   Specimen: BLOOD  Result Value Ref Range Status   Specimen Description BLOOD LEFT FOREARM  Final   Special Requests   Final    BOTTLES DRAWN AEROBIC AND ANAEROBIC Blood Culture adequate volume   Culture  Setup Time   Final    GRAM POSITIVE COCCI IN CLUSTERS IN BOTH  AEROBIC AND ANAEROBIC BOTTLES CRITICAL RESULT CALLED TO, READ BACK BY AND VERIFIED WITH: Hughie Closs [facility] 03/13/19 1829 JDW    Culture (A)  Final    STAPHYLOCOCCUS AUREUS SUSCEPTIBILITIES PERFORMED ON PREVIOUS CULTURE WITHIN THE LAST 5 DAYS. Performed at [facility], [place] [address], [place], [place] [address]    Report Status 03/15/2019 FINAL  Final  Respiratory Panel by RT PCR (Flu A&B, Covid) - Nasopharyngeal Swab     Status: None   Collection Time: 03/13/19 12:11 AM   Specimen: Nasopharyngeal Swab  Result Value Ref Range Status   SARS Coronavirus 2 by RT PCR NEGATIVE NEGATIVE Final    Comment: (NOTE) SARS-CoV-2 target nucleic acids are NOT DETECTED. The SARS-CoV-2 RNA is generally detectable in upper respiratoy specimens during the acute phase of infection. The lowest concentration of SARS-CoV-2 viral copies this assay can detect is 131 copies/mL. A negative result does not preclude SARS-Cov-2 infection and should not be used as the sole basis for treatment or other patient management decisions. A negative result may occur with  improper specimen collection/handling, submission of specimen other than nasopharyngeal swab, presence of viral mutation(s) within the areas targeted by this assay, and inadequate number of viral copies (<131 copies/mL). A negative result must be combined with clinical observations, patient history, and epidemiological information. The expected result is Negative. Fact Sheet for Patients:  PinkCheek.be Fact Sheet for Healthcare Providers:   GravelBags.it This test is not yet ap proved or cleared by the [place] FDA and  has been authorized for detection and/or diagnosis of SARS-CoV-2 by FDA under an Emergency Use Authorization (EUA). This EUA will remain  in effect (meaning this test can be used) for the duration of the COVID-19 declaration under Section 564(b)(1) of the Act, 21 U.S.C. section 360bbb-3(b)(1), unless the authorization is terminated or revoked sooner.    Influenza A by PCR NEGATIVE NEGATIVE Final   Influenza B by PCR NEGATIVE NEGATIVE Final    Comment: (NOTE) The Xpert Xpress SARS-CoV-2/FLU/RSV assay is intended as an aid in  the diagnosis of influenza from Nasopharyngeal swab specimens and  should not be used as a sole basis for treatment. Nasal washings and  aspirates are unacceptable for Xpert Xpress SARS-CoV-2/FLU/RSV  testing. Fact Sheet for Patients: PinkCheek.be Fact Sheet for Healthcare Providers: GravelBags.it This test is not yet approved or cleared by the [place] FDA and  has been authorized for detection and/or diagnosis of SARS-CoV-2 by  FDA under an Emergency Use Authorization (EUA). This EUA will remain  in effect (meaning this test can be used) for the duration of the  Covid-19 declaration under Section 564(b)(1) of the Act, 21  U.S.C. section 360bbb-3(b)(1), unless the authorization is  terminated or revoked. Performed at [facility], [place] [address] [place], [place] [address]   Blood culture (routine x 2)     Status: Abnormal   Collection Time: 03/13/19 12:36 AM   Specimen: BLOOD  Result Value Ref Range Status   Specimen Description BLOOD RIGHT HAND  Final   Special Requests   Final    BOTTLES DRAWN AEROBIC AND ANAEROBIC Blood Culture results may not be optimal due to an inadequate volume of blood received in culture bottles   Culture  Setup Time   Final    GRAM POSITIVE COCCI  IN CLUSTERS ANAEROBIC BOTTLE ONLY CRITICAL RESULT CALLED TO, READ BACK BY AND VERIFIED WITH: Hughie Closs [facility] 03/13/19 1829 JDW Performed at [facility], [place]  [address], [place], [place] [address]    Culture STAPHYLOCOCCUS AUREUS (A)  Final   Report Status 03/15/2019 FINAL  Final   Organism ID, Bacteria STAPHYLOCOCCUS AUREUS  Final      Susceptibility   Staphylococcus aureus - MIC*    CIPROFLOXACIN <=0.5 SENSITIVE Sensitive     ERYTHROMYCIN >=8 RESISTANT Resistant     GENTAMICIN <=0.5 SENSITIVE Sensitive     OXACILLIN <=0.25 SENSITIVE Sensitive     TETRACYCLINE >=16 RESISTANT Resistant     VANCOMYCIN 1 SENSITIVE Sensitive     TRIMETH/SULFA 80 RESISTANT Resistant     CLINDAMYCIN <=0.25 SENSITIVE Sensitive     RIFAMPIN <=0.5 SENSITIVE Sensitive     Inducible Clindamycin NEGATIVE Sensitive     * STAPHYLOCOCCUS AUREUS  Blood Culture ID Panel (Reflexed)     Status: Abnormal   Collection Time: 03/13/19 12:36 AM  Result Value Ref Range Status   Enterococcus species NOT DETECTED NOT DETECTED Final   Listeria monocytogenes NOT DETECTED NOT DETECTED Final   Staphylococcus species DETECTED (A) NOT DETECTED Final    Comment: CRITICAL RESULT CALLED TO, READ BACK BY AND VERIFIED WITH: Lytle Butte [facility] 03/13/19 1829 JDW    Staphylococcus aureus (BCID) DETECTED (A) NOT DETECTED Final    Comment: Methicillin (oxacillin) susceptible Staphylococcus aureus (MSSA). Preferred therapy is anti staphylococcal beta lactam antibiotic (Cefazolin or Nafcillin), unless clinically contraindicated. CRITICAL RESULT CALLED TO, READ BACK BY AND VERIFIED WITH: Lytle Butte [facility] 03/13/19 1829 JDW    Methicillin resistance NOT DETECTED NOT DETECTED Final   Streptococcus species NOT DETECTED NOT DETECTED Final   Streptococcus agalactiae NOT DETECTED NOT DETECTED Final   Streptococcus pneumoniae NOT DETECTED NOT DETECTED Final   Streptococcus pyogenes NOT DETECTED NOT DETECTED Final   Acinetobacter baumannii NOT  DETECTED NOT DETECTED Final   Enterobacteriaceae species NOT DETECTED NOT DETECTED Final   Enterobacter cloacae complex NOT DETECTED NOT DETECTED Final   Escherichia coli NOT DETECTED NOT DETECTED Final   Klebsiella oxytoca NOT DETECTED NOT DETECTED Final   Klebsiella pneumoniae NOT DETECTED NOT DETECTED Final   Proteus species NOT DETECTED NOT DETECTED Final   Serratia marcescens NOT DETECTED NOT DETECTED Final   Haemophilus influenzae NOT DETECTED NOT DETECTED Final   Neisseria meningitidis NOT DETECTED NOT DETECTED Final   Pseudomonas aeruginosa NOT DETECTED NOT DETECTED Final   Candida albicans NOT DETECTED NOT DETECTED Final   Candida glabrata NOT DETECTED NOT DETECTED Final   Candida krusei NOT DETECTED NOT DETECTED Final   Candida parapsilosis NOT DETECTED NOT DETECTED Final   Candida tropicalis NOT DETECTED NOT DETECTED Final    Comment: Performed at [facility], [address], [place], [place] [address]  Urine culture     Status: None   Collection Time: 03/13/19  3:52 AM   Specimen: Urine, Clean Catch  Result Value Ref Range Status   Specimen Description URINE, CLEAN CATCH  Final   Special Requests NONE  Final   Culture   Final    NO GROWTH Performed at [facility], [address], [place], [place] [address]    Report Status 03/13/2019 FINAL  Final  Surgical pcr screen     Status: None   Collection Time: 03/13/19 10:31 PM   Specimen: Nasal Mucosa; Nasal Swab  Result Value Ref Range Status   MRSA, PCR NEGATIVE NEGATIVE Final   Staphylococcus aureus NEGATIVE NEGATIVE Final    Comment: (NOTE) The Xpert SA Assay (FDA approved for NASAL specimens in patients 22  years of age and older), is one component of a comprehensive surveillance program. It is not intended to diagnose infection nor to guide or monitor treatment. Performed at [facility], [address] [place], [place] [address]   Aerobic/Anaerobic Culture (surgical/deep wound)     Status:  None (Preliminary result)   Collection Time: 03/14/19  9:16 AM   Specimen: PATH Other; Tissue  Result Value Ref Range Status   Specimen Description WOUND FIFTH TOE ULCER  Final   Special Requests NONE  Final   Gram Stain   Final    RARE WBC PRESENT,BOTH PMN AND MONONUCLEAR NO ORGANISMS SEEN Performed at [facility], [address] [place], [place] [address]    Culture PENDING  Incomplete   Report Status PENDING  Incomplete    Radiology Reports DG Chest Portable 1 View  Result Date: 03/12/2019 CLINICAL DATA:  Shortness of breath, fever, cough EXAM: PORTABLE CHEST 1 VIEW COMPARISON:  CT 11/30/2015, radiograph 09/26/2015 FINDINGS: Accounting for some atelectasis in the bases and patient body habitus, the lungs are clear. No consolidation, features of edema, pneumothorax, or effusion. The cardiomediastinal contours are unremarkable. No acute osseous or soft tissue abnormality. IMPRESSION: Accounting for body habitus, the lungs are clear. Electronically Signed   By: Kreg ShropshirePrice  DeHay M.D.   On: 03/12/2019 23:47   DG Foot 2 Views Right  Result Date: 03/14/2019 CLINICAL DATA:  Post right foot surgery. 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 Evidence of recent fifth toe amputation including the metatarsal and phalanges. There is a well-defined horizontal lucency over the base of the fourth metatarsal likely postsurgical. Postsurgical soft tissue changes over the lateral mid to forefoot. Remainder the exam is unchanged. IMPRESSION: 1. Postsurgical changes compatible recent fifth toe amputation. Smooth well-defined horizontal lucency over the base of the fourth metatarsal also likely postsurgical. 2.  Stable postsurgical changes over the first and second phalanges. Electronically Signed   By: Elberta Fortisaniel  Boyle M.D.   On: 03/14/2019 11:33    Lab Data:  CBC: Recent Labs   Lab 03/12/19 2243 03/13/19 0652 03/14/19 0451 03/15/19 0456  WBC 15.5* 10.2 6.4 6.7  NEUTROABS 13.6*  --   --   --   HGB 14.5 14.1 12.7* 11.5*  HCT 43.2 42.3 38.2* 34.3*  MCV 86.2 86.3 86.6 85.8  PLT 269 246 202 165   Basic Metabolic Panel: Recent Labs  Lab 03/12/19 2243 03/13/19 0652 03/14/19 0451 03/15/19 0456  NA 127* 131* 134* 136  K 4.0 3.6 3.7 3.6  CL 93* 96* 98 102  CO2 19* 24 26 25   GLUCOSE 356* 320* 149* 195*  BUN 30* 29* 21* 17  CREATININE 1.83* 1.96* 1.66* 1.56*  CALCIUM 8.8* 8.6* 8.2* 7.9*  MG  --  1.9 1.7 1.9   GFR: Estimated Creatinine Clearance: 77.4 mL/min (A) (by C-G formula based on SCr of 1.56 mg/dL (H)). Liver Function Tests: Recent Labs  Lab 03/12/19 2243 03/13/19 0652 03/14/19 0451 03/15/19 0456  AST 30 32 71* 69*  ALT 24 25 39 55*  ALKPHOS 79 82 84 103  BILITOT 1.3* 1.3* 1.2 1.0  PROT 7.2 6.9 5.9* 5.4*  ALBUMIN 3.0* 2.7* 2.2* 2.0*   No results for input(s): LIPASE, AMYLASE in the last 168 hours. No results for input(s): AMMONIA in the last 168 hours. Coagulation Profile: Recent Labs  Lab 03/13/19 0802  INR  1.0   Cardiac Enzymes: No results for input(s): CKTOTAL, CKMB, CKMBINDEX, TROPONINI in the last 168 hours. BNP (last 3 results) No results for input(s): PROBNP in the last 8760 hours. HbA1C: Recent Labs    03/13/19 0652  HGBA1C 8.8*   CBG: Recent Labs  Lab 03/14/19 1151 03/14/19 1604 03/14/19 2037 03/15/19 0652 03/15/19 0743  GLUCAP 181* 241* 299* 190* 229*   Lipid Profile: No results for input(s): CHOL, HDL, LDLCALC, TRIG, CHOLHDL, LDLDIRECT in the last 72 hours. Thyroid Function Tests: No results for input(s): TSH, T4TOTAL, FREET4, T3FREE, THYROIDAB in the last 72 hours. Anemia Panel: No results for input(s): VITAMINB12, FOLATE, FERRITIN, TIBC, IRON, RETICCTPCT in the last 72 hours. Urine analysis:    Component Value Date/Time   COLORURINE YELLOW 03/13/2019 0349   APPEARANCEUR HAZY (A) 03/13/2019 0349    LABSPEC 1.021 03/13/2019 0349   PHURINE 5.0 03/13/2019 0349   GLUCOSEU >=500 (A) 03/13/2019 0349   HGBUR LARGE (A) 03/13/2019 0349   BILIRUBINUR NEGATIVE 03/13/2019 0349   KETONESUR 5 (A) 03/13/2019 0349   PROTEINUR 100 (A) 03/13/2019 0349   NITRITE NEGATIVE 03/13/2019 0349   LEUKOCYTESUR NEGATIVE 03/13/2019 0349     Jackie Plum M.D. [facility] 03/15/2019, 10:44 AM  Pager: [phone] Between 7am to 7pm - call Pager - [phone]  After 7pm go to [email or web address] - password [email or web address]  Call night coverage person covering after 7pm

## 2019-03-15 NOTE — Progress Notes (Deleted)
Patient has personal CPAP machine at bedside.  Patient self manages unit.

## 2019-03-16 DIAGNOSIS — Z9889 Other specified postprocedural states: Secondary | ICD-10-CM

## 2019-03-16 DIAGNOSIS — B9561 Methicillin susceptible Staphylococcus aureus infection as the cause of diseases classified elsewhere: Secondary | ICD-10-CM

## 2019-03-16 DIAGNOSIS — E1169 Type 2 diabetes mellitus with other specified complication: Secondary | ICD-10-CM

## 2019-03-16 DIAGNOSIS — B954 Other streptococcus as the cause of diseases classified elsewhere: Secondary | ICD-10-CM

## 2019-03-16 DIAGNOSIS — L03115 Cellulitis of right lower limb: Secondary | ICD-10-CM

## 2019-03-16 DIAGNOSIS — R7881 Bacteremia: Secondary | ICD-10-CM

## 2019-03-16 DIAGNOSIS — L02611 Cutaneous abscess of right foot: Secondary | ICD-10-CM

## 2019-03-16 DIAGNOSIS — M869 Osteomyelitis, unspecified: Secondary | ICD-10-CM

## 2019-03-16 DIAGNOSIS — B9620 Unspecified Escherichia coli [E. coli] as the cause of diseases classified elsewhere: Secondary | ICD-10-CM

## 2019-03-16 DIAGNOSIS — Z89421 Acquired absence of other right toe(s): Secondary | ICD-10-CM

## 2019-03-16 DIAGNOSIS — Z1624 Resistance to multiple antibiotics: Secondary | ICD-10-CM

## 2019-03-16 LAB — GLUCOSE, CAPILLARY
Glucose-Capillary: 193 mg/dL — ABNORMAL HIGH (ref 70–99)
Glucose-Capillary: 195 mg/dL — ABNORMAL HIGH (ref 70–99)
Glucose-Capillary: 248 mg/dL — ABNORMAL HIGH (ref 70–99)
Glucose-Capillary: 325 mg/dL — ABNORMAL HIGH (ref 70–99)

## 2019-03-16 LAB — COMPREHENSIVE METABOLIC PANEL
ALT: 52 U/L — ABNORMAL HIGH (ref 0–44)
AST: 51 U/L — ABNORMAL HIGH (ref 15–41)
Albumin: 2 g/dL — ABNORMAL LOW (ref 3.5–5.0)
Alkaline Phosphatase: 126 U/L (ref 38–126)
Anion gap: 9 (ref 5–15)
BUN: 17 mg/dL (ref 6–20)
CO2: 24 mmol/L (ref 22–32)
Calcium: 7.7 mg/dL — ABNORMAL LOW (ref 8.9–10.3)
Chloride: 102 mmol/L (ref 98–111)
Creatinine, Ser: 1.43 mg/dL — ABNORMAL HIGH (ref 0.61–1.24)
GFR calc Af Amer: >60 mL/min (ref >60)
GFR calc non Af Amer: 54 mL/min — ABNORMAL LOW (ref >60)
Glucose, Bld: 221 mg/dL — ABNORMAL HIGH (ref 70–99)
Potassium: 3.7 mmol/L (ref 3.5–5.1)
Sodium: 135 mmol/L (ref 135–145)
Total Bilirubin: 1 mg/dL (ref 0.3–1.2)
Total Protein: 5.7 g/dL — ABNORMAL LOW (ref 6.5–8.1)

## 2019-03-16 LAB — CBC
HCT: 35 % — ABNORMAL LOW (ref 39.0–52.0)
Hemoglobin: 11.4 g/dL — ABNORMAL LOW (ref 13.0–17.0)
MCH: 28.5 pg (ref 26.0–34.0)
MCHC: 32.6 g/dL (ref 30.0–36.0)
MCV: 87.5 fL (ref 80.0–100.0)
Platelets: 188 10*3/uL (ref 150–400)
RBC: 4 MIL/uL — ABNORMAL LOW (ref 4.22–5.81)
RDW: 13.2 % (ref 11.5–15.5)
WBC: 8.2 10*3/uL (ref 4.0–10.5)
nRBC: 0 % (ref 0.0–0.2)

## 2019-03-16 LAB — MAGNESIUM: Magnesium: 1.8 mg/dL (ref 1.7–2.4)

## 2019-03-16 MED ORDER — SODIUM CHLORIDE 0.9 % IV SOLN: INTRAVENOUS | Status: DC

## 2019-03-16 MED ORDER — SODIUM CHLORIDE 0.9 % IV SOLN
2.0000 g | Freq: Three times a day (TID) | INTRAVENOUS | Status: DC
  Administered 2019-03-16: 2 g via INTRAVENOUS
  Filled 2019-03-16 (×2): qty 2

## 2019-03-16 MED ORDER — POLYETHYLENE GLYCOL 3350 17 G PO PACK
17.0000 g | PACK | Freq: Every day | ORAL | Status: DC | PRN
  Administered 2019-03-16 – 2019-03-17 (×2): 17 g via ORAL
  Filled 2019-03-16 (×2): qty 1

## 2019-03-16 MED ORDER — CEFAZOLIN SODIUM-DEXTROSE 2-4 GM/100ML-% IV SOLN
2.0000 g | Freq: Three times a day (TID) | INTRAVENOUS | Status: DC
  Administered 2019-03-16 – 2019-03-17 (×3): 2 g via INTRAVENOUS
  Filled 2019-03-16 (×5): qty 100

## 2019-03-16 MED ORDER — DOCUSATE SODIUM 100 MG PO CAPS
100.0000 mg | ORAL_CAPSULE | Freq: Two times a day (BID) | ORAL | Status: DC
  Administered 2019-03-16 – 2019-03-17 (×3): 100 mg via ORAL
  Filled 2019-03-16 (×4): qty 1

## 2019-03-16 NOTE — Progress Notes (Signed)
Patient up OOB and ambulated to chair and back to bed. Starting to get moving. Says he hasn't had a BM in a few days, but does not feel constipated and is still passing gas.

## 2019-03-16 NOTE — Progress Notes (Signed)
Subjective: [name] is a 59 y.o. male patient seen at bedside, resting comfortably in no acute distress s/p day #2 Right incision and drainage and 5th toe and distal metatarsal amputation performed on Saturday 03-14-19. Patient admits night sweats but denies pain at surgical site, denies calf pain, denies headache, chest pain, shortness of breath, nausea, vomitting, denies loss of appetite, denies problems with voiding. No other issues noted.   Patient Active Problem List   Diagnosis Date Noted  . Acute sepsis (HCC) 03/13/2019  . Osteomyelitis of fifth toe of right foot (HCC) 03/13/2019  . DKA (diabetic ketoacidosis) (HCC) 03/13/2019  . Acute osteomyelitis of toe of right foot (HCC)   . AKI (acute kidney injury) (HCC)   . Demand ischemia (HCC)   . Hyperglycemia   . Laryngopharyngeal reflux (LPR) 02/07/2016  . GERD (gastroesophageal reflux disease) 12/27/2015  . Arthritis 11/16/2015  . Diabetes mellitus (HCC) 11/16/2015  . Hyperlipidemia 11/16/2015  . Localized edema 11/16/2015  . OBESITY 05/15/2007  . ADD 05/15/2007  . Obesity, diabetes, and hypertension syndrome (HCC) 05/15/2007  . Allergic rhinitis 05/15/2007  . Obstructive sleep apnea 05/15/2007  . COUGH 05/15/2007     Current Facility-Administered Medications:  .  0.9 %  sodium chloride infusion, , Intravenous, Continuous, Ward, Kristen N, DO, Last Rate: 125 mL/hr at 03/16/19 0517, New Bag at 03/16/19 0517 .  0.9 %  sodium chloride infusion, , Intravenous, PRN, Osei-Bonsu, George, MD, Last Rate: 10 mL/hr at 03/15/19 0634, 1,000 mL at 03/15/19 0634 .  acetaminophen (TYLENOL) tablet 650 mg, 650 mg, Oral, Q6H PRN, 650 mg at 03/15/19 0821 **OR** acetaminophen (TYLENOL) suppository 650 mg, 650 mg, Rectal, Q6H PRN, Cristescu, Mircea G, MD .  aspirin EC tablet 81 mg, 81 mg, Oral, Daily, Cristescu, Mircea G, MD, 81 mg at 03/15/19 0816 .  cholecalciferol (VITAMIN D3) tablet 1,000 Units, 1,000 Units, Oral, Daily, Cristescu, Victorino Sparrow,  MD, 1,000 Units at 03/15/19 0816 .  enoxaparin (LOVENOX) injection 40 mg, 40 mg, Subcutaneous, Daily, Cristescu, Mircea G, MD, 40 mg at 03/15/19 0816 .  guaiFENesin-codeine 100-10 MG/5ML solution 10 mL, 10 mL, Oral, Q6H PRN, Osei-Bonsu, George, MD, 10 mL at 03/16/19 0524 .  hydrALAZINE (APRESOLINE) injection 5 mg, 5 mg, Intravenous, Q4H PRN, Cristescu, Mircea G, MD .  hydrocortisone cream 1 %, , Topical, TID PRN, Macon Large, NP, Given at 03/16/19 [phone] .  insulin aspart (novoLOG) injection 0-20 Units, 0-20 Units, Subcutaneous, TID AC & HS, Osei-Bonsu, Greggory Stallion, MD, 4 Units at 03/16/19 [phone] .  lactated ringers infusion, , Intravenous, Continuous, Gaynelle Adu, MD, Last Rate: 10 mL/hr at 03/14/19 3086, Restarted at 03/14/19 1015 .  montelukast (SINGULAIR) tablet 10 mg, 10 mg, Oral, QHS, Cristescu, Mircea G, MD, 10 mg at 03/15/19 2128 .  ondansetron (ZOFRAN) tablet 4 mg, 4 mg, Oral, Q6H PRN **OR** ondansetron (ZOFRAN) injection 4 mg, 4 mg, Intravenous, Q6H PRN, Cristescu, Mircea G, MD .  pantoprazole (PROTONIX) EC tablet 40 mg, 40 mg, Oral, BID, Cristescu, Mircea G, MD, 40 mg at 03/15/19 2128 .  piperacillin-tazobactam (ZOSYN) IVPB 3.375 g, 3.375 g, Intravenous, Q8H, Stevphen Rochester, RPH, Last Rate: 12.5 mL/hr at 03/16/19 0518, 3.375 g at 03/16/19 0518 .  simvastatin (ZOCOR) tablet 40 mg, 40 mg, Oral, QPM, Cristescu, Mircea G, MD, 40 mg at 03/15/19 1740 .  sodium chloride flush (NS) 0.9 % injection 3 mL, 3 mL, Intravenous, Q12H, Cristescu, Mircea G, MD, 3 mL at 03/15/19 2133 .  sodium chloride flush (NS) 0.9 %  injection 3 mL, 3 mL, Intravenous, PRN, Cristescu, Mircea G, MD .  vitamin B-12 (CYANOCOBALAMIN) tablet 1,000 mcg, 1,000 mcg, Oral, Daily, Cristescu, Mircea G, MD, 1,000 mcg at 03/15/19 0816  No Known Allergies   Objective: Today's Vitals   03/16/19 0445 03/16/19 0500 03/16/19 0600 03/16/19 0700  BP: (!) 108/46     Pulse: 80     Resp: 20 (!) 27 13 20   Temp: 98.5 F (36.9  C)     TempSrc: Oral     SpO2: 95%     Weight: (!) 145.9 kg     Height:      PainSc:        General: No acute distress  Right Lower extremity: Dressing to left foot clean, dry, intact. No strikethrough noted, Upon removal of dressings sutures intact with no dehiscence, packing present at distal wound site. Decreased erythema, decreased edema, mild blood drainage. No other acute signs of infection. No calf pain. Range of motion excluding surgical site within normal limits with no pain or crepitation.      Labs as in chart  Post Op Xray, Right foot: Consistent with post surgical changes  Assessment and Plan:  Problem List Items Addressed This Visit      Cardiovascular and Mediastinum   Demand ischemia (HCC)   Relevant Medications   aspirin EC tablet 81 mg   simvastatin (ZOCOR) tablet 40 mg   enoxaparin (LOVENOX) injection 40 mg   hydrALAZINE (APRESOLINE) injection 5 mg     Musculoskeletal and Integument   Acute osteomyelitis of toe of right foot ([place])     Genitourinary   AKI (acute kidney injury) ([place])     Other   * (Principal) Acute sepsis ([place])   Hyperglycemia    Other Visit Diagnoses    Cellulitis of right foot    -  Primary   S/P foot surgery, right       Relevant Orders   DG Foot 2 Views Right (Completed)      -Patient seen and evaluated at bedside -Chart reviewed  -Dressing change performed  -Advised patient to make sure to keep dressing clean, dry, and intact to right foot allowing nursing to change dressings as ordered  -Awaiting intra-op cultures; continue with IV antibiotics until cultures have resulted and follow ID recs for sepsis  -Weightbearing to heel with post op shoe for bedside and bathroom only with assistive device/crutches -PT may work with patient with weightbearing restrictions as above -Continue with rest and elevation to assist with pain and edema control  -Podiatry will continue to follow closely  -Anticipated discharge plan of care: To  home with antibiotics based on culture results. Dressing changes to be done in office. Patient after discharge to follow up in office within 5-7 days.   Landis Martins, DPM [facility] [phone] cell [phone] office

## 2019-03-16 NOTE — Progress Notes (Signed)
PROGRESS NOTE    [name]  OEU:[id number] DOB: [birth year] DOA: 03/12/2019 PCP: Josetta Huddle, MD   Brief Narrative:  This is a 59 year old male with history of morbid obesity, HLD, HTN, DM 2, right foot osteomyelitis supposed to have surgery Monday, came with chief complaints of fever, generalized weakness, and also has noted his right foot has become more red.  He was found to be septic with fever, elevated white count, likely in the setting of right foot osteomyelitis/cellulitis  Interim history: 2 blood cultures positive for MSSA and MRSA, getting TEE to assess for vegetations and awaiting repeat cultures.   Assessment & Plan:   Principal Problem:   Acute sepsis ([place]) Active Problems:   OBESITY   Obesity, diabetes, and hypertension syndrome (HCC)   Obstructive sleep apnea   Diabetes mellitus (HCC)   Hyperlipidemia   GERD (gastroesophageal reflux disease)   Osteomyelitis of fifth toe of right foot (HCC)   DKA (diabetic ketoacidosis) ([place])  Sepsis due to right foot cellulitis/underlying osteomyelitis -He follows with Dr. Cannon Kettle as an outpatient, underwent an MRI in [place] which showed osteomyelitis and had surgery right foot , 03/14/2019 -Seen by ID, sepsis like picture from MSSA/MRSA bacteremia likely secondary to DFU/osteo/cellulitis, newly MRSA on second blood culture from admission 03/16/19, still awaiting read on repeat cultures from 03/15/19 -currently on zosyn and cefepime restarted today by ID due to MRSA -TEE tomorrow to assess for vegetations but ID has recommended likely 4 weeks antibiotics even if no vegetations  DM2, hyperglycemia -Continue Lantus, sliding scale  OSA -Nightly CPAP  Acute kidney injury -resolved to baseline, encouraged oral intake  Elevated troponin -Secondary to demand ischemia, no chest pain, not in a pattern consistent with ACS  Essential hypertension -Hold home medications in the setting of sepsis  Hyperlipidemia -Continue  statin  Morbid obesity with BMI greater than 40 -Patient will benefit from weight loss  DVT prophylaxis: Lovenox Code Status: full Family Communication: patient only Disposition Plan:  . Patient came from: home            . Anticipated d/c place: home . Barriers to d/c OR conditions which need to be met to effect a safe d/c: blood cultures from 03/15/19 needs to be ngtd, picc line inserted, TEE to assess for vegetations to allow timeline for IV antibiotic therapy   Consultants:   Podiatry  ID  Cardiology  Procedures:   R 5th toe amputation Dr. Cannon Kettle  Antimicrobials:   Zosyn Start date 03/13/19  Cefepime Start date 03/16/19    Subjective: Feeling well overall, would like to get home when able. Using CPAP at night time which he uses at home. Denies chest pains, breathing stable. Denies abdominal pain, diarrhea, constipation.   Objective: Vitals:   03/16/19 0445 03/16/19 0500 03/16/19 0600 03/16/19 0700  BP: (!) 108/46     Pulse: 80     Resp: 20 (!) _0 Temp: 98.5 F (36.9 C)     TempSrc: Oral     SpO2: 95%     Weight: (!) 145.9 kg     Height:        Intake/Output Summary (Last 24 hours) at 03/16/2019 1027 Last data filed at 03/16/2019 0518 Gross per 24 hour  Intake 3313.17 ml  Output 1650 ml  Net 1663.17 ml   Filed Weights   03/14/19 0013 03/15/19 0434 03/16/19 0445  Weight: (!) 142 kg (!) 145.9 kg (!) 145.9 kg    Examination:  General exam: Appears  calm and comfortable, obese Respiratory system: Clear to auscultation. Respiratory effort normal. Cardiovascular system: S1 & S2 heard, RRR. No JVD, murmurs, rubs, gallops or clicks. No pedal edema. Gastrointestinal system: Abdomen is nondistended, soft and nontender. No organomegaly or masses felt. Normal bowel sounds heard. Central nervous system: Alert and oriented. No focal neurological deficits. Extremities: Symmetric 5 x 5 power, right foot not examined as dressing just changed when podiatry  examined. Skin: No rashes, lesions or ulcers Psychiatry: Judgement and insight appear normal. Mood & affect appropriate.     Data Reviewed: I have personally reviewed following labs and imaging studies  CBC: Recent Labs  Lab 03/12/19 2243 03/13/19 0652 03/14/19 0451 03/15/19 0456 03/16/19 0222  WBC 15.5* 10.2 6.4 6.7 8.2  NEUTROABS 13.6*  --   --   --   --   HGB 14.5 14.1 12.7* 11.5* 11.4*  HCT 43.2 42.3 38.2* 34.3* 35.0*  MCV 86.2 86.3 86.6 85.8 87.5  PLT 269 246 202 165 189   Basic Metabolic Panel: Recent Labs  Lab 03/12/19 2243 03/13/19 0652 03/14/19 0451 03/15/19 0456 03/16/19 0222  NA 127* 131* 134* 136 135  K 4.0 3.6 3.7 3.6 3.7  CL 93* 96* 98 102 102  CO2 19* _0 GLUCOSE 356* 320* 149* 195* 221*  BUN 30* 29* 21* 17 17  CREATININE 1.83* 1.96* 1.66* 1.56* 1.43*  CALCIUM 8.8* 8.6* 8.2* 7.9* 7.7*  MG  --  1.9 1.7 1.9 1.8   GFR: Estimated Creatinine Clearance: 84.5 mL/min (A) (by C-G formula based on SCr of 1.43 mg/dL (H)). Liver Function Tests: Recent Labs  Lab 03/12/19 2243 03/13/19 8421 03/14/19 0451 03/15/19 0456 03/16/19 0222  AST 30 32 71* 69* 51*  ALT 24 25 39 55* 52*  ALKPHOS 79 82 84 103 126  BILITOT 1.3* 1.3* 1.2 1.0 1.0  PROT 7.2 6.9 5.9* 5.4* 5.7*  ALBUMIN 3.0* 2.7* 2.2* 2.0* 2.0*   No results for input(s): LIPASE, AMYLASE in the last 168 hours. No results for input(s): AMMONIA in the last 168 hours. Coagulation Profile: Recent Labs  Lab 03/13/19 0802  INR 1.0   Cardiac Enzymes: No results for input(s): CKTOTAL, CKMB, CKMBINDEX, TROPONINI in the last 168 hours. BNP (last 3 results) No results for input(s): PROBNP in the last 8760 hours. HbA1C: No results for input(s): HGBA1C in the last 72 hours. CBG: Recent Labs  Lab 03/15/19 0652 03/15/19 0743 03/15/19 1724 03/15/19 2107 03/16/19 0614  GLUCAP 190* 229* 241* 286* 193*   Lipid Profile: No results for input(s): CHOL, HDL, LDLCALC, TRIG, CHOLHDL, LDLDIRECT in the  last 72 hours. Thyroid Function Tests: No results for input(s): TSH, T4TOTAL, FREET4, T3FREE, THYROIDAB in the last 72 hours. Anemia Panel: No results for input(s): VITAMINB12, FOLATE, FERRITIN, TIBC, IRON, RETICCTPCT in the last 72 hours. Sepsis Labs: Recent Labs  Lab 03/13/19 0011 03/13/19 0652  PROCALCITON  --  3.07  LATICACIDVEN 1.7  --     Recent Results (from the past 240 hour(s))  Blood culture (routine x 2)     Status: Abnormal   Collection Time: 03/13/19 12:11 AM   Specimen: BLOOD  Result Value Ref Range Status   Specimen Description BLOOD LEFT FOREARM  Final   Special Requests   Final    BOTTLES DRAWN AEROBIC AND ANAEROBIC Blood Culture adequate volume   Culture  Setup Time   Final    GRAM POSITIVE COCCI IN CLUSTERS IN BOTH AEROBIC AND ANAEROBIC BOTTLES CRITICAL RESULT CALLED  TO, READ BACK BY AND VERIFIED WITH: Hughie Closs [facility] 03/13/19 1829 JDW    Culture (A)  Final    STAPHYLOCOCCUS AUREUS SUSCEPTIBILITIES PERFORMED ON PREVIOUS CULTURE WITHIN THE LAST 5 DAYS. Performed at [facility], [place] [address], [place], [place] [address]    Report Status 03/15/2019 FINAL  Final  Respiratory Panel by RT PCR (Flu A&B, Covid) - Nasopharyngeal Swab     Status: None   Collection Time: 03/13/19 12:11 AM   Specimen: Nasopharyngeal Swab  Result Value Ref Range Status   SARS Coronavirus 2 by RT PCR NEGATIVE NEGATIVE Final    Comment: (NOTE) SARS-CoV-2 target nucleic acids are NOT DETECTED. The SARS-CoV-2 RNA is generally detectable in upper respiratoy specimens during the acute phase of infection. The lowest concentration of SARS-CoV-2 viral copies this assay can detect is 131 copies/mL. A negative result does not preclude SARS-Cov-2 infection and should not be used as the sole basis for treatment or other patient management decisions. A negative result may occur with  improper specimen collection/handling, submission of specimen other than nasopharyngeal swab, presence of  viral mutation(s) within the areas targeted by this assay, and inadequate number of viral copies (<131 copies/mL). A negative result must be combined with clinical observations, patient history, and epidemiological information. The expected result is Negative. Fact Sheet for Patients:  PinkCheek.be Fact Sheet for Healthcare Providers:  GravelBags.it This test is not yet ap proved or cleared by the [place] FDA and  has been authorized for detection and/or diagnosis of SARS-CoV-2 by FDA under an Emergency Use Authorization (EUA). This EUA will remain  in effect (meaning this test can be used) for the duration of the COVID-19 declaration under Section 564(b)(1) of the Act, 21 U.S.C. section 360bbb-3(b)(1), unless the authorization is terminated or revoked sooner.    Influenza A by PCR NEGATIVE NEGATIVE Final   Influenza B by PCR NEGATIVE NEGATIVE Final    Comment: (NOTE) The Xpert Xpress SARS-CoV-2/FLU/RSV assay is intended as an aid in  the diagnosis of influenza from Nasopharyngeal swab specimens and  should not be used as a sole basis for treatment. Nasal washings and  aspirates are unacceptable for Xpert Xpress SARS-CoV-2/FLU/RSV  testing. Fact Sheet for Patients: PinkCheek.be Fact Sheet for Healthcare Providers: GravelBags.it This test is not yet approved or cleared by the [place] FDA and  has been authorized for detection and/or diagnosis of SARS-CoV-2 by  FDA under an Emergency Use Authorization (EUA). This EUA will remain  in effect (meaning this test can be used) for the duration of the  Covid-19 declaration under Section 564(b)(1) of the Act, 21  U.S.C. section 360bbb-3(b)(1), unless the authorization is  terminated or revoked. Performed at [facility], [place] [address] [place], [place] [address]   Blood culture (routine x 2)     Status:  Abnormal   Collection Time: 03/13/19 12:36 AM   Specimen: BLOOD  Result Value Ref Range Status   Specimen Description BLOOD RIGHT HAND  Final   Special Requests   Final    BOTTLES DRAWN AEROBIC AND ANAEROBIC Blood Culture results may not be optimal due to an inadequate volume of blood received in culture bottles   Culture  Setup Time   Final    GRAM POSITIVE COCCI IN CLUSTERS ANAEROBIC BOTTLE ONLY CRITICAL RESULT CALLED TO, READ BACK BY AND VERIFIED WITH: Hughie Closs [facility] 03/13/19 1829 JDW Performed at [facility], [place] [address], [place], [place] [address]  Culture STAPHYLOCOCCUS AUREUS (A)  Final   Report Status 03/15/2019 FINAL  Final   Organism ID, Bacteria STAPHYLOCOCCUS AUREUS  Final      Susceptibility   Staphylococcus aureus - MIC*    CIPROFLOXACIN <=0.5 SENSITIVE Sensitive     ERYTHROMYCIN >=8 RESISTANT Resistant     GENTAMICIN <=0.5 SENSITIVE Sensitive     OXACILLIN <=0.25 SENSITIVE Sensitive     TETRACYCLINE >=16 RESISTANT Resistant     VANCOMYCIN 1 SENSITIVE Sensitive     TRIMETH/SULFA 80 RESISTANT Resistant     CLINDAMYCIN <=0.25 SENSITIVE Sensitive     RIFAMPIN <=0.5 SENSITIVE Sensitive     Inducible Clindamycin NEGATIVE Sensitive     * STAPHYLOCOCCUS AUREUS  Blood Culture ID Panel (Reflexed)     Status: Abnormal   Collection Time: 03/13/19 12:36 AM  Result Value Ref Range Status   Enterococcus species NOT DETECTED NOT DETECTED Final   Listeria monocytogenes NOT DETECTED NOT DETECTED Final   Staphylococcus species DETECTED (A) NOT DETECTED Final    Comment: CRITICAL RESULT CALLED TO, READ BACK BY AND VERIFIED WITH: Hughie Closs [facility] 03/13/19 1829 JDW    Staphylococcus aureus (BCID) DETECTED (A) NOT DETECTED Final    Comment: Methicillin (oxacillin) susceptible Staphylococcus aureus (MSSA). Preferred therapy is anti staphylococcal beta lactam antibiotic (Cefazolin or Nafcillin), unless clinically contraindicated. CRITICAL RESULT CALLED TO, READ BACK BY AND  VERIFIED WITH: Hughie Closs [facility] 03/13/19 1829 JDW    Methicillin resistance NOT DETECTED NOT DETECTED Final   Streptococcus species NOT DETECTED NOT DETECTED Final   Streptococcus agalactiae NOT DETECTED NOT DETECTED Final   Streptococcus pneumoniae NOT DETECTED NOT DETECTED Final   Streptococcus pyogenes NOT DETECTED NOT DETECTED Final   Acinetobacter baumannii NOT DETECTED NOT DETECTED Final   Enterobacteriaceae species NOT DETECTED NOT DETECTED Final   Enterobacter cloacae complex NOT DETECTED NOT DETECTED Final   Escherichia coli NOT DETECTED NOT DETECTED Final   Klebsiella oxytoca NOT DETECTED NOT DETECTED Final   Klebsiella pneumoniae NOT DETECTED NOT DETECTED Final   Proteus species NOT DETECTED NOT DETECTED Final   Serratia marcescens NOT DETECTED NOT DETECTED Final   Haemophilus influenzae NOT DETECTED NOT DETECTED Final   Neisseria meningitidis NOT DETECTED NOT DETECTED Final   Pseudomonas aeruginosa NOT DETECTED NOT DETECTED Final   Candida albicans NOT DETECTED NOT DETECTED Final   Candida glabrata NOT DETECTED NOT DETECTED Final   Candida krusei NOT DETECTED NOT DETECTED Final   Candida parapsilosis NOT DETECTED NOT DETECTED Final   Candida tropicalis NOT DETECTED NOT DETECTED Final    Comment: Performed at [facility], [place] [address] [place], [place] [address]  Urine culture     Status: None   Collection Time: 03/13/19  3:52 AM   Specimen: Urine, Clean Catch  Result Value Ref Range Status   Specimen Description URINE, CLEAN CATCH  Final   Special Requests NONE  Final   Culture   Final    NO GROWTH Performed at [facility], [address], [place], [place] [address]    Report Status 03/13/2019 FINAL  Final  Surgical pcr screen     Status: None   Collection Time: 03/13/19 10:31 PM   Specimen: Nasal Mucosa; Nasal Swab  Result Value Ref Range Status   MRSA, PCR NEGATIVE NEGATIVE Final   Staphylococcus aureus NEGATIVE NEGATIVE Final    Comment:  (NOTE) The Xpert SA Assay (FDA approved for NASAL specimens in patients 78 years of age and older), is one  component of a comprehensive surveillance program. It is not intended to diagnose infection nor to guide or monitor treatment. Performed at [facility], [place] [address], [place], [place] [address]   Aerobic/Anaerobic Culture (surgical/deep wound)     Status: None (Preliminary result)   Collection Time: 03/14/19  9:16 AM   Specimen: PATH Other; Tissue  Result Value Ref Range Status   Specimen Description WOUND FIFTH TOE ULCER  Final   Special Requests NONE  Final   Gram Stain   Final    RARE WBC PRESENT,BOTH PMN AND MONONUCLEAR NO ORGANISMS SEEN Performed at [facility], [address], [place], [place] [address]    Culture FEW GRAM NEGATIVE RODS  Final   Report Status PENDING  Incomplete    Radiology Studies: DG Foot 2 Views Right  Result Date: 03/14/2019 CLINICAL DATA:  Post right foot surgery. 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 Evidence of recent fifth toe amputation including the metatarsal and phalanges. There is a well-defined horizontal lucency over the base of the fourth metatarsal likely postsurgical. Postsurgical soft tissue changes over the lateral mid to forefoot. Remainder the exam is unchanged. IMPRESSION: 1. Postsurgical changes compatible recent fifth toe amputation. Smooth well-defined horizontal lucency over the base of the fourth metatarsal also likely postsurgical. 2.  Stable postsurgical changes over the first and second phalanges. Electronically Signed   By: Marin Olp M.D.   On: 03/14/2019 11:33   Scheduled Meds: . aspirin EC  81 mg Oral Daily  . cholecalciferol  1,000 Units Oral Daily  . enoxaparin (LOVENOX) injection  40 mg Subcutaneous Daily  . insulin aspart  0-20 Units Subcutaneous TID AC & HS  .  montelukast  10 mg Oral QHS  . pantoprazole  40 mg Oral BID  . simvastatin  40 mg Oral QPM  . sodium chloride flush  3 mL Intravenous Q12H  . vitamin B-12  1,000 mcg Oral Daily   Continuous Infusions: . sodium chloride 125 mL/hr at 03/16/19 0517  . sodium chloride 1,000 mL (03/15/19 0634)  . ceFEPime (MAXIPIME) IV    . lactated ringers 10 mL/hr at 03/14/19 5638    LOS: 3 days   Time spent: 25  Hoyt Koch, MD [facility]  To contact the attending provider between 7A-7P or the covering provider during after hours 7P-7A, please log into the web site [email or web address] and access using universal [place] password for that web site. If you do not have the password, please call the hospital operator.  03/16/2019, 10:27 AM

## 2019-03-16 NOTE — Progress Notes (Signed)
    [facility] has been requested to perform a transesophageal echocardiogram on [name] for bacteremia.  After careful review of history and examination, the risks and benefits of transesophageal echocardiogram have been explained including risks of esophageal damage, perforation (1:10,000 risk), bleeding, pharyngeal hematoma as well as other potential complications associated with conscious sedation including aspiration, arrhythmia, respiratory failure and death. Alternatives to treatment were discussed, questions were answered. Patient is willing to proceed.   The procedure is scheduled for 01/14/20 at 12:00 with Dr. Duke Salvia.  Of note, the patient has a history of esophagitis and having his esophagus "stretched" in the past, last was about 15 years ago. He says that currently he is having no trouble swallowing or other issues with his esophagus. Would proceed gently for TEE.  Berton Bon, NP  03/16/2019 12:55 PM

## 2019-03-16 NOTE — Plan of Care (Signed)

## 2019-03-16 NOTE — Plan of Care (Signed)
  Problem: Education: Goal: Knowledge of General Education information will improve Description Including pain rating scale, medication(s)/side effects and non-pharmacologic comfort measures Outcome: Progressing   Problem: Health Behavior/Discharge Planning: Goal: Ability to manage health-related needs will improve Outcome: Progressing   

## 2019-03-16 NOTE — Progress Notes (Signed)
Pt stated he could place self on CPAP unit when ready for bed. RT added sterile water to water chamber per pt request. RT will continue to monitor pt as needed.

## 2019-03-16 NOTE — Progress Notes (Signed)
[facility]  Date of Admission:  03/12/2019      Total days of antibiotics 5             ASSESSMENT: [name] is a 59 y.o. male with diabetes and foot wound complicated by abscess/cellulitis and MSSA bacteremia. Underwent 5th toe I&D and distal metatarsal amputation. Noted to have purulence draining from the incision site and puss draining from plantar ulceration site. 5th metatarsal head was resected back to the level of midshaft.   He will require further work up for MSSA bacteremia with TEE to ensure that he does not have an indication for longer course of antibiotics. Still waiting for most recent blood cultures drawn 1/31 to show clearance prior to PICC line. Hold on this for now.   Culture from foot polymicrobial growing Staph Aureus, Strep anguinosis, E Coli (S-cefazolin, R- bactrim & augmentin/unasyn). Will narrow to cefazolin IV. He states he will be able to accommodate home antibiotics but he is self-pay. Will work with [facility] team regarding options.     PLAN: 1. Change cefepime to cefazolin  2. Follow TEE 2/2 for duration of antibiotics  3. Follow BCx drawn 1/31 for timing of PICC    Principal Problem:   Acute sepsis (HCC) Active Problems:   OBESITY   Obesity, diabetes, and hypertension syndrome (HCC)   Obstructive sleep apnea   Diabetes mellitus (HCC)   Hyperlipidemia   GERD (gastroesophageal reflux disease)   Osteomyelitis of fifth toe of right foot (HCC)   DKA (diabetic ketoacidosis) (HCC)   . aspirin EC  81 mg Oral Daily  . cholecalciferol  1,000 Units Oral Daily  . docusate sodium  100 mg Oral BID  . enoxaparin (LOVENOX) injection  40 mg Subcutaneous Daily  . insulin aspart  0-20 Units Subcutaneous TID AC & HS  . montelukast  10 mg Oral QHS  . pantoprazole  40 mg Oral BID  . simvastatin  40 mg Oral QPM  . sodium chloride flush  3 mL Intravenous Q12H  . vitamin B-12  1,000 mcg Oral Daily    SUBJECTIVE: Upset  about not eating today when his test is scheduled for tomorrow. Upset the TEE is not today.  No pain at the amputation site on foot.  Afebrile. No side effects to antibiotics.   Review of Systems: Review of Systems  Constitutional: Negative for chills and fever.  HENT: Negative for tinnitus.   Eyes: Negative for blurred vision and photophobia.  Respiratory: Negative for cough and sputum production.   Cardiovascular: Negative for chest pain.  Gastrointestinal: Negative for diarrhea, nausea and vomiting.  Genitourinary: Negative for dysuria.  Skin: Negative for rash.  Neurological: Negative for headaches.    No Known Allergies  OBJECTIVE: Vitals:   03/16/19 0500 03/16/19 0600 03/16/19 0700 03/16/19 1214  BP:    (!) 149/75  Pulse:    82  Resp: (!) 27 13 20    Temp:    98.1 F (36.7 C)  TempSrc:    Oral  SpO2:    94%  Weight:      Height:       Body mass index is 44.88 kg/m.  Physical Exam Constitutional:      Appearance: He is well-developed.     Comments: Seated comfortably in chair during visit.   HENT:     Mouth/Throat:     Dentition: Normal dentition. No dental abscesses.  Cardiovascular:  Rate and Rhythm: Normal rate and regular rhythm.     Heart sounds: Normal heart sounds.  Pulmonary:     Effort: Pulmonary effort is normal.     Breath sounds: Normal breath sounds.  Abdominal:     General: There is no distension.     Palpations: Abdomen is soft.     Tenderness: There is no abdominal tenderness.  Lymphadenopathy:     Cervical: No cervical adenopathy.  Skin:    General: Skin is warm and dry.     Findings: No rash.  Neurological:     Mental Status: He is alert and oriented to person, place, and time.  Psychiatric:        Judgment: Judgment normal.     Comments: In good spirits today and engaged in care discussion.      Lab Results Lab Results  Component Value Date   WBC 8.2 03/16/2019   HGB 11.4 (L) 03/16/2019   HCT 35.0 (L) 03/16/2019   MCV  87.5 03/16/2019   PLT 188 03/16/2019    Lab Results  Component Value Date   CREATININE 1.43 (H) 03/16/2019   BUN 17 03/16/2019   NA 135 03/16/2019   K 3.7 03/16/2019   CL 102 03/16/2019   CO2 24 03/16/2019    Lab Results  Component Value Date   ALT 52 (H) 03/16/2019   AST 51 (H) 03/16/2019   ALKPHOS 126 03/16/2019   BILITOT 1.0 03/16/2019     Microbiology: Recent Results (from the past 240 hour(s))  Blood culture (routine x 2)     Status: Abnormal   Collection Time: 03/13/19 12:11 AM   Specimen: BLOOD  Result Value Ref Range Status   Specimen Description BLOOD LEFT FOREARM  Final   Special Requests   Final    BOTTLES DRAWN AEROBIC AND ANAEROBIC Blood Culture adequate volume   Culture  Setup Time   Final    GRAM POSITIVE COCCI IN CLUSTERS IN BOTH AEROBIC AND ANAEROBIC BOTTLES CRITICAL RESULT CALLED TO, READ BACK BY AND VERIFIED WITH: Lytle Butte [facility] 03/13/19 1829 JDW    Culture (A)  Final    STAPHYLOCOCCUS AUREUS SUSCEPTIBILITIES PERFORMED ON PREVIOUS CULTURE WITHIN THE LAST 5 DAYS. Performed at [facility], [address], [place], [place] [address]    Report Status 03/15/2019 FINAL  Final  Respiratory Panel by RT PCR (Flu A&B, Covid) - Nasopharyngeal Swab     Status: None   Collection Time: 03/13/19 12:11 AM   Specimen: Nasopharyngeal Swab  Result Value Ref Range Status   SARS Coronavirus 2 by RT PCR NEGATIVE NEGATIVE Final    Comment: (NOTE) SARS-CoV-2 target nucleic acids are NOT DETECTED. The SARS-CoV-2 RNA is generally detectable in upper respiratoy specimens during the acute phase of infection. The lowest concentration of SARS-CoV-2 viral copies this assay can detect is 131 copies/mL. A negative result does not preclude SARS-Cov-2 infection and should not be used as the sole basis for treatment or other patient management decisions. A negative result may occur with  improper specimen collection/handling, submission of specimen other than  nasopharyngeal swab, presence of viral mutation(s) within the areas targeted by this assay, and inadequate number of viral copies (<131 copies/mL). A negative result must be combined with clinical observations, patient history, and epidemiological information. The expected result is Negative. Fact Sheet for Patients:  https://www.moore.com/ Fact Sheet for Healthcare Providers:  https://www.young.biz/ This test is not yet ap proved or cleared by the [place] and  has been authorized for detection and/or diagnosis of SARS-CoV-2 by FDA under an Emergency Use Authorization (EUA). This EUA will remain  in effect (meaning this test can be used) for the duration of the COVID-19 declaration under Section 564(b)(1) of the Act, 21 U.S.C. section 360bbb-3(b)(1), unless the authorization is terminated or revoked sooner.    Influenza A by PCR NEGATIVE NEGATIVE Final   Influenza B by PCR NEGATIVE NEGATIVE Final    Comment: (NOTE) The Xpert Xpress SARS-CoV-2/FLU/RSV assay is intended as an aid in  the diagnosis of influenza from Nasopharyngeal swab specimens and  should not be used as a sole basis for treatment. Nasal washings and  aspirates are unacceptable for Xpert Xpress SARS-CoV-2/FLU/RSV  testing. Fact Sheet for Patients: PinkCheek.be Fact Sheet for Healthcare Providers: GravelBags.it This test is not yet approved or cleared by the [place] FDA and  has been authorized for detection and/or diagnosis of SARS-CoV-2 by  FDA under an Emergency Use Authorization (EUA). This EUA will remain  in effect (meaning this test can be used) for the duration of the  Covid-19 declaration under Section 564(b)(1) of the Act, 21  U.S.C. section 360bbb-3(b)(1), unless the authorization is  terminated or revoked. Performed at [facility], [place] [address], [place], [place] [address]   Blood  culture (routine x 2)     Status: Abnormal   Collection Time: 03/13/19 12:36 AM   Specimen: BLOOD  Result Value Ref Range Status   Specimen Description BLOOD RIGHT HAND  Final   Special Requests   Final    BOTTLES DRAWN AEROBIC AND ANAEROBIC Blood Culture results may not be optimal due to an inadequate volume of blood received in culture bottles   Culture  Setup Time   Final    GRAM POSITIVE COCCI IN CLUSTERS ANAEROBIC BOTTLE ONLY CRITICAL RESULT CALLED TO, READ BACK BY AND VERIFIED WITH: Hughie Closs [facility] 03/13/19 1829 JDW Performed at [facility], [place] [address] [place], [place] [address]    Culture STAPHYLOCOCCUS AUREUS (A)  Final   Report Status 03/15/2019 FINAL  Final   Organism ID, Bacteria STAPHYLOCOCCUS AUREUS  Final      Susceptibility   Staphylococcus aureus - MIC*    CIPROFLOXACIN <=0.5 SENSITIVE Sensitive     ERYTHROMYCIN >=8 RESISTANT Resistant     GENTAMICIN <=0.5 SENSITIVE Sensitive     OXACILLIN <=0.25 SENSITIVE Sensitive     TETRACYCLINE >=16 RESISTANT Resistant     VANCOMYCIN 1 SENSITIVE Sensitive     TRIMETH/SULFA 80 RESISTANT Resistant     CLINDAMYCIN <=0.25 SENSITIVE Sensitive     RIFAMPIN <=0.5 SENSITIVE Sensitive     Inducible Clindamycin NEGATIVE Sensitive     * STAPHYLOCOCCUS AUREUS  Blood Culture ID Panel (Reflexed)     Status: Abnormal   Collection Time: 03/13/19 12:36 AM  Result Value Ref Range Status   Enterococcus species NOT DETECTED NOT DETECTED Final   Listeria monocytogenes NOT DETECTED NOT DETECTED Final   Staphylococcus species DETECTED (A) NOT DETECTED Final    Comment: CRITICAL RESULT CALLED TO, READ BACK BY AND VERIFIED WITH: Hughie Closs [facility] 03/13/19 1829 JDW    Staphylococcus aureus (BCID) DETECTED (A) NOT DETECTED Final    Comment: Methicillin (oxacillin) susceptible Staphylococcus aureus (MSSA). Preferred therapy is anti staphylococcal beta lactam antibiotic (Cefazolin or Nafcillin), unless clinically contraindicated. CRITICAL  RESULT CALLED TO, READ BACK BY AND VERIFIED WITH: Hughie Closs [facility] 03/13/19 1829 JDW    Methicillin resistance NOT DETECTED NOT DETECTED  Final   Streptococcus species NOT DETECTED NOT DETECTED Final   Streptococcus agalactiae NOT DETECTED NOT DETECTED Final   Streptococcus pneumoniae NOT DETECTED NOT DETECTED Final   Streptococcus pyogenes NOT DETECTED NOT DETECTED Final   Acinetobacter baumannii NOT DETECTED NOT DETECTED Final   Enterobacteriaceae species NOT DETECTED NOT DETECTED Final   Enterobacter cloacae complex NOT DETECTED NOT DETECTED Final   Escherichia coli NOT DETECTED NOT DETECTED Final   Klebsiella oxytoca NOT DETECTED NOT DETECTED Final   Klebsiella pneumoniae NOT DETECTED NOT DETECTED Final   Proteus species NOT DETECTED NOT DETECTED Final   Serratia marcescens NOT DETECTED NOT DETECTED Final   Haemophilus influenzae NOT DETECTED NOT DETECTED Final   Neisseria meningitidis NOT DETECTED NOT DETECTED Final   Pseudomonas aeruginosa NOT DETECTED NOT DETECTED Final   Candida albicans NOT DETECTED NOT DETECTED Final   Candida glabrata NOT DETECTED NOT DETECTED Final   Candida krusei NOT DETECTED NOT DETECTED Final   Candida parapsilosis NOT DETECTED NOT DETECTED Final   Candida tropicalis NOT DETECTED NOT DETECTED Final    Comment: Performed at [facility], [address] [place], [place] [address]  Urine culture     Status: None   Collection Time: 03/13/19  3:52 AM   Specimen: Urine, Clean Catch  Result Value Ref Range Status   Specimen Description URINE, CLEAN CATCH  Final   Special Requests NONE  Final   Culture   Final    NO GROWTH Performed at [facility], [address] [place], [place] [address]    Report Status 03/13/2019 FINAL  Final  Surgical pcr screen     Status: None   Collection Time: 03/13/19 10:31 PM   Specimen: Nasal Mucosa; Nasal Swab  Result Value Ref Range Status   MRSA, PCR NEGATIVE NEGATIVE Final   Staphylococcus aureus NEGATIVE  NEGATIVE Final    Comment: (NOTE) The Xpert SA Assay (FDA approved for NASAL specimens in patients 57 years of age and older), is one component of a comprehensive surveillance program. It is not intended to diagnose infection nor to guide or monitor treatment. Performed at [facility], [address] [place], [place] [address]   Aerobic/Anaerobic Culture (surgical/deep wound)     Status: None (Preliminary result)   Collection Time: 03/14/19  9:16 AM   Specimen: PATH Other; Tissue  Result Value Ref Range Status   Specimen Description WOUND FIFTH TOE ULCER  Final   Special Requests NONE  Final   Gram Stain   Final    RARE WBC PRESENT,BOTH PMN AND MONONUCLEAR NO ORGANISMS SEEN Performed at [facility], [address] [place], [place] [address]    Culture   Final    FEW ESCHERICHIA COLI FEW STAPHYLOCOCCUS AUREUS FEW STREPTOCOCCUS ANGINOSIS SUSCEPTIBILITIES TO FOLLOW NO ANAEROBES ISOLATED; CULTURE IN PROGRESS FOR 5 DAYS    Report Status PENDING  Incomplete   Organism ID, Bacteria ESCHERICHIA COLI  Final      Susceptibility   Escherichia coli - MIC*    AMPICILLIN >=32 RESISTANT Resistant     CEFAZOLIN <=4 SENSITIVE Sensitive     CEFEPIME <=0.12 SENSITIVE Sensitive     CEFTAZIDIME <=1 SENSITIVE Sensitive     CEFTRIAXONE <=0.25 SENSITIVE Sensitive     CIPROFLOXACIN <=0.25 SENSITIVE Sensitive     GENTAMICIN <=1 SENSITIVE Sensitive     IMIPENEM <=0.25 SENSITIVE Sensitive     TRIMETH/SULFA >=320 RESISTANT Resistant     AMPICILLIN/SULBACTAM 16 INTERMEDIATE Intermediate  PIP/TAZO <=4 SENSITIVE Sensitive     * FEW ESCHERICHIA COLI  Culture, blood (Routine X 2) w Reflex to ID Panel     Status: None (Preliminary result)   Collection Time: 03/15/19 11:24 AM   Specimen: BLOOD  Result Value Ref Range Status   Specimen Description BLOOD RIGHT ANTECUBITAL  Final   Special Requests   Final    BOTTLES DRAWN AEROBIC AND ANAEROBIC Blood Culture adequate volume   Culture    Final    NO GROWTH < 24 HOURS Performed at [facility], [address] [place], [place] [address]    Report Status PENDING  Incomplete  Culture, blood (Routine X 2) w Reflex to ID Panel     Status: None (Preliminary result)   Collection Time: 03/15/19 11:40 AM   Specimen: BLOOD LEFT HAND  Result Value Ref Range Status   Specimen Description BLOOD LEFT HAND  Final   Special Requests   Final    BOTTLES DRAWN AEROBIC AND ANAEROBIC Blood Culture adequate volume   Culture   Final    NO GROWTH < 24 HOURS Performed at [facility], [address] [place], [place] [address]    Report Status PENDING  Incomplete     Rexene Alberts, MSN, NP-C [facility]  [email or web address] Pager: [phone] Office: [phone] [facility] Main Line: [phone]

## 2019-03-17 ENCOUNTER — Encounter (HOSPITAL_COMMUNITY)
Admission: EM | Disposition: A | Discharge status: Home Health | Payer: Self-pay | Source: Home / Self Care | Attending: Internal Medicine

## 2019-03-17 ENCOUNTER — Inpatient Hospital Stay (HOSPITAL_COMMUNITY): Payer: BC Managed Care – PPO

## 2019-03-17 ENCOUNTER — Inpatient Hospital Stay: Payer: Self-pay

## 2019-03-17 ENCOUNTER — Encounter (HOSPITAL_COMMUNITY): Payer: Self-pay | Admitting: Emergency Medicine

## 2019-03-17 ENCOUNTER — Inpatient Hospital Stay (HOSPITAL_COMMUNITY): Payer: BC Managed Care – PPO | Admitting: Certified Registered"

## 2019-03-17 DIAGNOSIS — A4901 Methicillin susceptible Staphylococcus aureus infection, unspecified site: Secondary | ICD-10-CM | POA: Diagnosis not present

## 2019-03-17 DIAGNOSIS — I340 Nonrheumatic mitral (valve) insufficiency: Secondary | ICD-10-CM

## 2019-03-17 DIAGNOSIS — E11621 Type 2 diabetes mellitus with foot ulcer: Secondary | ICD-10-CM

## 2019-03-17 DIAGNOSIS — E1110 Type 2 diabetes mellitus with ketoacidosis without coma: Secondary | ICD-10-CM | POA: Diagnosis not present

## 2019-03-17 DIAGNOSIS — L97519 Non-pressure chronic ulcer of other part of right foot with unspecified severity: Secondary | ICD-10-CM

## 2019-03-17 DIAGNOSIS — R7881 Bacteremia: Secondary | ICD-10-CM

## 2019-03-17 DIAGNOSIS — L03115 Cellulitis of right lower limb: Secondary | ICD-10-CM

## 2019-03-17 HISTORY — PX: TEE WITHOUT CARDIOVERSION: SHX5443

## 2019-03-17 LAB — CBC
HCT: 33.4 % — ABNORMAL LOW (ref 39.0–52.0)
Hemoglobin: 10.8 g/dL — ABNORMAL LOW (ref 13.0–17.0)
MCH: 28.6 pg (ref 26.0–34.0)
MCHC: 32.3 g/dL (ref 30.0–36.0)
MCV: 88.4 fL (ref 80.0–100.0)
Platelets: 235 10*3/uL (ref 150–400)
RBC: 3.78 MIL/uL — ABNORMAL LOW (ref 4.22–5.81)
RDW: 13.2 % (ref 11.5–15.5)
WBC: 9.1 10*3/uL (ref 4.0–10.5)
nRBC: 0 % (ref 0.0–0.2)

## 2019-03-17 LAB — COMPREHENSIVE METABOLIC PANEL
ALT: 48 U/L — ABNORMAL HIGH (ref 0–44)
AST: 45 U/L — ABNORMAL HIGH (ref 15–41)
Albumin: 1.9 g/dL — ABNORMAL LOW (ref 3.5–5.0)
Alkaline Phosphatase: 159 U/L — ABNORMAL HIGH (ref 38–126)
Anion gap: 8 (ref 5–15)
BUN: 15 mg/dL (ref 6–20)
CO2: 27 mmol/L (ref 22–32)
Calcium: 7.9 mg/dL — ABNORMAL LOW (ref 8.9–10.3)
Chloride: 101 mmol/L (ref 98–111)
Creatinine, Ser: 1.37 mg/dL — ABNORMAL HIGH (ref 0.61–1.24)
GFR calc Af Amer: >60 mL/min (ref >60)
GFR calc non Af Amer: 57 mL/min — ABNORMAL LOW (ref >60)
Glucose, Bld: 249 mg/dL — ABNORMAL HIGH (ref 70–99)
Potassium: 4.5 mmol/L (ref 3.5–5.1)
Sodium: 136 mmol/L (ref 135–145)
Total Bilirubin: 0.6 mg/dL (ref 0.3–1.2)
Total Protein: 5.6 g/dL — ABNORMAL LOW (ref 6.5–8.1)

## 2019-03-17 LAB — GLUCOSE, CAPILLARY
Glucose-Capillary: 223 mg/dL — ABNORMAL HIGH (ref 70–99)
Glucose-Capillary: 209 mg/dL — ABNORMAL HIGH (ref 70–99)
Glucose-Capillary: 192 mg/dL — ABNORMAL HIGH (ref 70–99)
Glucose-Capillary: 235 mg/dL — ABNORMAL HIGH (ref 70–99)

## 2019-03-17 LAB — MAGNESIUM: Magnesium: 2.1 mg/dL (ref 1.7–2.4)

## 2019-03-17 SURGERY — ECHOCARDIOGRAM, TRANSESOPHAGEAL
Anesthesia: Monitor Anesthesia Care

## 2019-03-17 MED ORDER — SODIUM CHLORIDE 0.9% FLUSH: 10.0000 mL | INTRAVENOUS | Status: DC | PRN

## 2019-03-17 MED ORDER — CEFAZOLIN IV (FOR PTA / DISCHARGE USE ONLY): 2.0000 g | Freq: Three times a day (TID) | INTRAVENOUS | 0 refills | Status: AC

## 2019-03-17 MED ORDER — PROMETHAZINE HCL 25 MG/ML IJ SOLN: 6.2500 mg | INTRAMUSCULAR | Status: DC | PRN

## 2019-03-17 MED ORDER — PROPOFOL 500 MG/50ML IV EMUL
INTRAVENOUS | Status: DC | PRN
  Administered 2019-03-17: 150 ug/kg/min via INTRAVENOUS

## 2019-03-17 MED ORDER — SODIUM CHLORIDE 0.9% FLUSH: 10.0000 mL | Freq: Two times a day (BID) | INTRAVENOUS | Status: DC

## 2019-03-17 MED ORDER — LACTATED RINGERS IV SOLN: INTRAVENOUS | Status: DC | PRN

## 2019-03-17 MED ORDER — CHLORHEXIDINE GLUCONATE CLOTH 2 % EX PADS: 6.0000 | MEDICATED_PAD | Freq: Every day | CUTANEOUS | Status: DC

## 2019-03-17 NOTE — H&P (Signed)
[name] is a 58 y.o. male who has presented today for surgery, with the diagnosis of bacteremia.  The various methods of treatment have been discussed with the patient and family. After consideration of risks, benefits and other options for treatment, the patient has consented to  Procedure(s): TRANSESOPHAGEAL ECHOCARDIOGRAM (TEE) (N/A) as a surgical intervention .  The patient's history has been reviewed, patient examined, no change in status, stable for surgery.  I have reviewed the patient's chart and labs.  Questions were answered to the patient's satisfaction.    Mercer Stallworth [name], MD, [facility]  03/17/2019 12:09 PM

## 2019-03-17 NOTE — Progress Notes (Signed)
[facility] for Infectious Disease  Date of Admission:  03/12/2019      Total days of antibiotics 5             ASSESSMENT: [name] is a 59 y.o. male with diabetes and foot wound complicated by abscess/cellulitis and MSSA bacteremia. Underwent 5th toe I&D and distal metatarsal amputation. Noted to have purulence draining from the incision site and puss draining from plantar ulceration site. 5th metatarsal head was resected back to the level of midshaft.   TEE to be done today to assist with duration of antibiotic therapy given bacteremic with virulent organism. PICC to be placed today after checking blood cultures one more time. With possible changes to R 4th toe indicating some lucency to the bone will err on the side of caution with his treatment with further sparing of the foot in mind and start with 4 weeks IV minimum (presuming he has no endocarditis on today's TEE). May need oral therapy thereafter.   CRP/ESR today prior to D/C to trend with concern over possible deeper bone/joint involvement of 4th toe.   Culture from foot polymicrobial growing Staph Aureus, Strep anguinosis, E Coli (S-cefazolin, R- bactrim & augmentin/unasyn).   PLAN: 1. OPAT as outlined below.  2. Draw blood cultures today prior to PICC line  3. OK to place PICC after TEE/BCx  Will sign off - please call with any questions.   OPAT ORDERS:  Diagnosis: Bacteremia, Right DFU with abscess #5th (s/p amputation) and bony lucency noted to 4th toe   Culture Result: MSSA (blood), Foot wound MSSA, streptococcus anguinosis, E Coli (R-amp-sulb and bactrim).   No Known Allergies   Discharge antibiotics: Cefazolin 2 gm IV Q8h   Duration: 4 weeks    End Date: March 3rd   [place] Per Protocol with [employer] Use: Home health RN for IV administration and teaching, line care and labs.    Labs weekly while on IV antibiotics: _x_ CBC with differential __ BMP _x_ CMP _x_ CRP _x_  ESR __ Vancomycin trough __ CK  __ Please pull PIC at completion of IV antibiotics _x_ Please leave PIC in place until doctor has seen patient or been notified  Fax weekly labs to [phone]  Clinic Follow Up Appt: March 3rd @ 11:30 am with Dr. Tommy Medal.      Principal Problem:   MSSA bacteremia Active Problems:   OBESITY   Obesity, diabetes, and hypertension syndrome ([place])   Obstructive sleep apnea   Diabetes mellitus ([place])   Hyperlipidemia   GERD (gastroesophageal reflux disease)   Acute sepsis (HCC)   Osteomyelitis of fifth toe of right foot ([place])   DKA (diabetic ketoacidosis) ([place])   S/P foot surgery, right   . [MAR Hold] aspirin EC  81 mg Oral Daily  . [MAR Hold] cholecalciferol  1,000 Units Oral Daily  . [MAR Hold] docusate sodium  100 mg Oral BID  . [MAR Hold] enoxaparin (LOVENOX) injection  40 mg Subcutaneous Daily  . [MAR Hold] insulin aspart  0-20 Units Subcutaneous TID AC & HS  . [MAR Hold] montelukast  10 mg Oral QHS  . [MAR Hold] pantoprazole  40 mg Oral BID  . [MAR Hold] simvastatin  40 mg Oral QPM  . [MAR Hold] sodium chloride flush  3 mL Intravenous Q12H  . [MAR Hold] vitamin B-12  1,000 mcg Oral Daily    SUBJECTIVE: Going for TEE in a few hours. Hopeful  he can be discharged thereafter.  Afebrile. No side effects to antibiotics.     Review of Systems: Review of Systems  Constitutional: Negative for chills and fever.  HENT: Negative for tinnitus.   Eyes: Negative for blurred vision and photophobia.  Respiratory: Negative for cough and sputum production.   Cardiovascular: Negative for chest pain.  Gastrointestinal: Negative for diarrhea, nausea and vomiting.  Genitourinary: Negative for dysuria.  Skin: Negative for rash.  Neurological: Negative for headaches.    No Known Allergies  OBJECTIVE: Vitals:   03/17/19 0356 03/17/19 0952 03/17/19 1012 03/17/19 1112  BP:  (!) 162/91  (!) 175/78  Pulse:  80  72  Resp:    (!) 27  Temp:   98.2  F (36.8 C) 98 F (36.7 C)  TempSrc:   Oral Temporal  SpO2:  96%  95%  Weight: (!) 148.2 kg   (!) 148.2 kg  Height:    _0  (1.803 m)   Body mass index is 45.57 kg/m.  Physical Exam Constitutional:      Appearance: He is well-developed.     Comments: Resting quietly in bed. No distress. Pleasant.   HENT:     Mouth/Throat:     Dentition: Normal dentition. No dental abscesses.  Cardiovascular:     Rate and Rhythm: Normal rate and regular rhythm.     Heart sounds: Normal heart sounds.  Pulmonary:     Effort: Pulmonary effort is normal.     Breath sounds: Normal breath sounds.  Abdominal:     General: There is no distension.     Palpations: Abdomen is soft.     Tenderness: There is no abdominal tenderness.  Musculoskeletal:     Comments: R foot wrapped in clean/dry dressing.   Lymphadenopathy:     Cervical: No cervical adenopathy.  Skin:    General: Skin is warm and dry.     Findings: No rash.  Neurological:     Mental Status: He is alert and oriented to person, place, and time.  Psychiatric:        Judgment: Judgment normal.     Lab Results Lab Results  Component Value Date   WBC 9.1 03/17/2019   HGB 10.8 (L) 03/17/2019   HCT 33.4 (L) 03/17/2019   MCV 88.4 03/17/2019   PLT 235 03/17/2019    Lab Results  Component Value Date   CREATININE 1.37 (H) 03/17/2019   BUN 15 03/17/2019   NA 136 03/17/2019   K 4.5 03/17/2019   CL 101 03/17/2019   CO2 27 03/17/2019    Lab Results  Component Value Date   ALT 48 (H) 03/17/2019   AST 45 (H) 03/17/2019   ALKPHOS 159 (H) 03/17/2019   BILITOT 0.6 03/17/2019     Microbiology: Recent Results (from the past 240 hour(s))  Blood culture (routine x 2)     Status: Abnormal   Collection Time: 03/13/19 12:11 AM   Specimen: BLOOD  Result Value Ref Range Status   Specimen Description BLOOD LEFT FOREARM  Final   Special Requests   Final    BOTTLES DRAWN AEROBIC AND ANAEROBIC Blood Culture adequate volume   Culture  Setup  Time   Final    GRAM POSITIVE COCCI IN CLUSTERS IN BOTH AEROBIC AND ANAEROBIC BOTTLES CRITICAL RESULT CALLED TO, READ BACK BY AND VERIFIED WITH: Hughie Closs [facility] 03/13/19 1829 JDW    Culture (A)  Final    STAPHYLOCOCCUS AUREUS SUSCEPTIBILITIES PERFORMED ON PREVIOUS CULTURE WITHIN THE LAST  5 DAYS. Performed at [facility], [place] [address] [place], [place] [address]    Report Status 03/15/2019 FINAL  Final  Respiratory Panel by RT PCR (Flu A&B, Covid) - Nasopharyngeal Swab     Status: None   Collection Time: 03/13/19 12:11 AM   Specimen: Nasopharyngeal Swab  Result Value Ref Range Status   SARS Coronavirus 2 by RT PCR NEGATIVE NEGATIVE Final    Comment: (NOTE) SARS-CoV-2 target nucleic acids are NOT DETECTED. The SARS-CoV-2 RNA is generally detectable in upper respiratoy specimens during the acute phase of infection. The lowest concentration of SARS-CoV-2 viral copies this assay can detect is 131 copies/mL. A negative result does not preclude SARS-Cov-2 infection and should not be used as the sole basis for treatment or other patient management decisions. A negative result may occur with  improper specimen collection/handling, submission of specimen other than nasopharyngeal swab, presence of viral mutation(s) within the areas targeted by this assay, and inadequate number of viral copies (<131 copies/mL). A negative result must be combined with clinical observations, patient history, and epidemiological information. The expected result is Negative. Fact Sheet for Patients:  PinkCheek.be Fact Sheet for Healthcare Providers:  GravelBags.it This test is not yet ap proved or cleared by the [place] FDA and  has been authorized for detection and/or diagnosis of SARS-CoV-2 by FDA under an Emergency Use Authorization (EUA). This EUA will remain  in effect (meaning this test can be used) for the duration of the COVID-19  declaration under Section 564(b)(1) of the Act, 21 U.S.C. section 360bbb-3(b)(1), unless the authorization is terminated or revoked sooner.    Influenza A by PCR NEGATIVE NEGATIVE Final   Influenza B by PCR NEGATIVE NEGATIVE Final    Comment: (NOTE) The Xpert Xpress SARS-CoV-2/FLU/RSV assay is intended as an aid in  the diagnosis of influenza from Nasopharyngeal swab specimens and  should not be used as a sole basis for treatment. Nasal washings and  aspirates are unacceptable for Xpert Xpress SARS-CoV-2/FLU/RSV  testing. Fact Sheet for Patients: PinkCheek.be Fact Sheet for Healthcare Providers: GravelBags.it This test is not yet approved or cleared by the [place] FDA and  has been authorized for detection and/or diagnosis of SARS-CoV-2 by  FDA under an Emergency Use Authorization (EUA). This EUA will remain  in effect (meaning this test can be used) for the duration of the  Covid-19 declaration under Section 564(b)(1) of the Act, 21  U.S.C. section 360bbb-3(b)(1), unless the authorization is  terminated or revoked. Performed at [facility], [place] [address], [place], [place] [address]   Blood culture (routine x 2)     Status: Abnormal   Collection Time: 03/13/19 12:36 AM   Specimen: BLOOD  Result Value Ref Range Status   Specimen Description BLOOD RIGHT HAND  Final   Special Requests   Final    BOTTLES DRAWN AEROBIC AND ANAEROBIC Blood Culture results may not be optimal due to an inadequate volume of blood received in culture bottles   Culture  Setup Time   Final    GRAM POSITIVE COCCI IN CLUSTERS ANAEROBIC BOTTLE ONLY CRITICAL RESULT CALLED TO, READ BACK BY AND VERIFIED WITH: Hughie Closs [facility] 03/13/19 1829 JDW Performed at [facility], [place] [address], [place], [place] [address]    Culture STAPHYLOCOCCUS AUREUS (A)  Final   Report Status 03/15/2019 FINAL  Final   Organism ID, Bacteria STAPHYLOCOCCUS  AUREUS  Final      Susceptibility   Staphylococcus  aureus - MIC*    CIPROFLOXACIN <=0.5 SENSITIVE Sensitive     ERYTHROMYCIN >=8 RESISTANT Resistant     GENTAMICIN <=0.5 SENSITIVE Sensitive     OXACILLIN <=0.25 SENSITIVE Sensitive     TETRACYCLINE >=16 RESISTANT Resistant     VANCOMYCIN 1 SENSITIVE Sensitive     TRIMETH/SULFA 80 RESISTANT Resistant     CLINDAMYCIN <=0.25 SENSITIVE Sensitive     RIFAMPIN <=0.5 SENSITIVE Sensitive     Inducible Clindamycin NEGATIVE Sensitive     * STAPHYLOCOCCUS AUREUS  Blood Culture ID Panel (Reflexed)     Status: Abnormal   Collection Time: 03/13/19 12:36 AM  Result Value Ref Range Status   Enterococcus species NOT DETECTED NOT DETECTED Final   Listeria monocytogenes NOT DETECTED NOT DETECTED Final   Staphylococcus species DETECTED (A) NOT DETECTED Final    Comment: CRITICAL RESULT CALLED TO, READ BACK BY AND VERIFIED WITH: Hughie Closs [facility] 03/13/19 1829 JDW    Staphylococcus aureus (BCID) DETECTED (A) NOT DETECTED Final    Comment: Methicillin (oxacillin) susceptible Staphylococcus aureus (MSSA). Preferred therapy is anti staphylococcal beta lactam antibiotic (Cefazolin or Nafcillin), unless clinically contraindicated. CRITICAL RESULT CALLED TO, READ BACK BY AND VERIFIED WITH: Hughie Closs [facility] 03/13/19 1829 JDW    Methicillin resistance NOT DETECTED NOT DETECTED Final   Streptococcus species NOT DETECTED NOT DETECTED Final   Streptococcus agalactiae NOT DETECTED NOT DETECTED Final   Streptococcus pneumoniae NOT DETECTED NOT DETECTED Final   Streptococcus pyogenes NOT DETECTED NOT DETECTED Final   Acinetobacter baumannii NOT DETECTED NOT DETECTED Final   Enterobacteriaceae species NOT DETECTED NOT DETECTED Final   Enterobacter cloacae complex NOT DETECTED NOT DETECTED Final   Escherichia coli NOT DETECTED NOT DETECTED Final   Klebsiella oxytoca NOT DETECTED NOT DETECTED Final   Klebsiella pneumoniae NOT DETECTED NOT DETECTED Final   Proteus species  NOT DETECTED NOT DETECTED Final   Serratia marcescens NOT DETECTED NOT DETECTED Final   Haemophilus influenzae NOT DETECTED NOT DETECTED Final   Neisseria meningitidis NOT DETECTED NOT DETECTED Final   Pseudomonas aeruginosa NOT DETECTED NOT DETECTED Final   Candida albicans NOT DETECTED NOT DETECTED Final   Candida glabrata NOT DETECTED NOT DETECTED Final   Candida krusei NOT DETECTED NOT DETECTED Final   Candida parapsilosis NOT DETECTED NOT DETECTED Final   Candida tropicalis NOT DETECTED NOT DETECTED Final    Comment: Performed at [facility], [place] [address], [place], [place] [address]  Urine culture     Status: None   Collection Time: 03/13/19  3:52 AM   Specimen: Urine, Clean Catch  Result Value Ref Range Status   Specimen Description URINE, CLEAN CATCH  Final   Special Requests NONE  Final   Culture   Final    NO GROWTH Performed at [facility], [address], [place], [place] [address]    Report Status 03/13/2019 FINAL  Final  Surgical pcr screen     Status: None   Collection Time: 03/13/19 10:31 PM   Specimen: Nasal Mucosa; Nasal Swab  Result Value Ref Range Status   MRSA, PCR NEGATIVE NEGATIVE Final   Staphylococcus aureus NEGATIVE NEGATIVE Final    Comment: (NOTE) The Xpert SA Assay (FDA approved for NASAL specimens in patients 20 years of age and older), is one component of a comprehensive surveillance program. It is not intended to diagnose infection nor to guide or monitor treatment. Performed at [facility], [place] [address], [place], [place] [address]  Aerobic/Anaerobic Culture (surgical/deep wound)     Status: None (Preliminary result)   Collection Time: 03/14/19  9:16 AM   Specimen: PATH Other; Tissue  Result Value Ref Range Status   Specimen Description WOUND FIFTH TOE ULCER  Final   Special Requests NONE  Final   Gram Stain   Final    RARE WBC PRESENT,BOTH PMN AND MONONUCLEAR NO ORGANISMS SEEN Performed at [facility],  [address], [place], [place] [address]    Culture   Final    FEW ESCHERICHIA COLI FEW STAPHYLOCOCCUS AUREUS FEW STREPTOCOCCUS ANGINOSIS SUSCEPTIBILITIES TO FOLLOW NO ANAEROBES ISOLATED; CULTURE IN PROGRESS FOR 5 DAYS    Report Status PENDING  Incomplete   Organism ID, Bacteria ESCHERICHIA COLI  Final      Susceptibility   Escherichia coli - MIC*    AMPICILLIN >=32 RESISTANT Resistant     CEFAZOLIN <=4 SENSITIVE Sensitive     CEFEPIME <=0.12 SENSITIVE Sensitive     CEFTAZIDIME <=1 SENSITIVE Sensitive     CEFTRIAXONE <=0.25 SENSITIVE Sensitive     CIPROFLOXACIN <=0.25 SENSITIVE Sensitive     GENTAMICIN <=1 SENSITIVE Sensitive     IMIPENEM <=0.25 SENSITIVE Sensitive     TRIMETH/SULFA >=320 RESISTANT Resistant     AMPICILLIN/SULBACTAM 16 INTERMEDIATE Intermediate     PIP/TAZO <=4 SENSITIVE Sensitive     * FEW ESCHERICHIA COLI  Culture, blood (Routine X 2) w Reflex to ID Panel     Status: None (Preliminary result)   Collection Time: 03/15/19 11:24 AM   Specimen: BLOOD  Result Value Ref Range Status   Specimen Description BLOOD RIGHT ANTECUBITAL  Final   Special Requests   Final    BOTTLES DRAWN AEROBIC AND ANAEROBIC Blood Culture adequate volume Performed at [facility], [address] [place], [place] [address]    Culture NO GROWTH 2 DAYS  Final   Report Status PENDING  Incomplete  Culture, blood (Routine X 2) w Reflex to ID Panel     Status: None (Preliminary result)   Collection Time: 03/15/19 11:40 AM   Specimen: BLOOD LEFT HAND  Result Value Ref Range Status   Specimen Description BLOOD LEFT HAND  Final   Special Requests   Final    BOTTLES DRAWN AEROBIC AND ANAEROBIC Blood Culture adequate volume Performed at [facility], [place] [address] [place], [place] [address]    Culture NO GROWTH 2 DAYS  Final   Report Status PENDING  Incomplete     Janene Madeira, MSN, NP-C [facility] for Infectious Disease [email or web address] Pager: [phone] Office: [phone] [place]: [phone]

## 2019-03-17 NOTE — Progress Notes (Signed)
Peripherally Inserted Central Catheter/Midline Placement  The IV Nurse has discussed with the patient and/or persons authorized to consent for the patient, the purpose of this procedure and the potential benefits and risks involved with this procedure.  The benefits include less needle sticks, lab draws from the catheter, and the patient may be discharged home with the catheter. Risks include, but not limited to, infection, bleeding, blood clot (thrombus formation), and puncture of an artery; nerve damage and irregular heartbeat and possibility to perform a PICC exchange if needed/ordered by physician.  Alternatives to this procedure were also discussed.  Bard Power PICC patient education guide, fact sheet on infection prevention and patient information card has been provided to patient /or left at bedside.    PICC/Midline Placement Documentation  PICC Single Lumen 03/17/19 PICC Right Basilic 41 cm 0 cm (Active)  Indication for Insertion or Continuance of Line Home intravenous therapies (PICC only) 03/17/19 1600  Exposed Catheter (cm) 0 cm 03/17/19 1600  Site Assessment Clean;Dry;Intact 03/17/19 1600  Line Status Flushed;Blood return noted 03/17/19 1600  Dressing Type Transparent 03/17/19 1600  Dressing Status Clean;Dry;Intact;Antimicrobial disc in place 03/17/19 1600  Dressing Change Due 03/24/19 03/17/19 1600       Stacie Glaze [place] 03/17/2019, 4:30 PM

## 2019-03-17 NOTE — CV Procedure (Signed)
Brief TEE Note  LVEF 60-65% No LA/LAA thrombus or mass Mild MR, multiple jets Mild AR No evidence of endocarditis.   For additional details see full report.  [name] [name], MD, [facility]  03/17/2019 12:31 PM

## 2019-03-17 NOTE — Discharge Summary (Signed)
Physician Discharge Summary  [name]:[id number] DOB: [birth year] DOA: 03/12/2019  PCP: Josetta Huddle, MD  Admit date: 03/12/2019 Discharge date: 03/17/2019  Admitted From: home Disposition:  home  Recommendations for Outpatient Follow-up:  1. Follow up with PCP in 1-2 weeks 2. Follow up ID April 15, 2019 3. Follow up cardiology as needed 4. Follow up with podiatry as scheduled post-op 5. Please obtain BMP/CBC per ID guidelines for IV antibiotics 6. Please follow up on the following pending results: repeat blood cultures 03/15/19 and 03/17/19 to ensure negative  Home Health:yes for IV cefazolin through 04/15/19 Equipment/Devices:PICC  Discharge Condition:stable CODE STATUS:full Diet recommendation: Carb Modified  Brief/Interim Summary: This is a 59 year old male with history of morbid obesity, HLD, HTN, DM 2, right foot osteomyelitis supposed to have surgery Monday, came with chief complaints of fever, generalized weakness, and also has noted his right foot has become more red. He was found to be septic with fever, elevated white count, likely in the setting of right foot osteomyelitis/cellulitis  Hospital course: Treated for sepsis related to right foot cellulitis/osteomyelitis. Underwent right 5th toe amputation 03/14/19 by Dr. Cannon Kettle. Blood cultures positive from that date for MSSA/MRSA. Due to MRI findings 4th toe ID has recommended minimum 4 weeks IV antibiotics which were narrowed to cefazolin throughout course. Blood cultures repeated negative 48 hours prior to PICC line placement. Blood cultures drawn morning of PICC line placement, please follow results. Had TEE done 03/17/19 to assess for endocarditis which did not show any vegetations. PICC line was then placed and IV antibiotics arranged through 04/15/19 per ID guidelines below. Patient doing well after TEE and was discharged home same day. 5th toe amputation healing well and will follow up with podiatry as outpatient for wound  healing. ID to manage antibiotics and assess if further oral are needed after IV completed 04/15/19.  OPAT ORDERS: Diagnosis: Bacteremia, Right DFU with abscess #5th (s/p amputation) and bony lucency noted to 4th toe  Culture Result: MSSA (blood), Foot wound MSSA, streptococcus anguinosis, E Coli (R-amp-sulb and bactrim).   Discharge antibiotics: Cefazolin 2 gm IV Q8h  Duration: 4 weeks   End Date: March 3rd   [place] Per Protocol with [employer] Use: Home health RN for IV administration and teaching, line care and labs.   Labs weekly while on IV antibiotics: _x_ CBC with differential __ BMP _x_ CMP _x_ CRP _x_ ESR __ Vancomycin trough __ CK  __ Please pull PIC at completion of IV antibiotics _x_ Please leave PIC in place until doctor has seen patient or been notified  Fax weekly labs to [phone]  Clinic Follow Up Appt: March 3rd @ 11:30 am with Dr. Tommy Medal.   Discharge Diagnoses:  Principal Problem:   MSSA bacteremia Active Problems:   OBESITY   Obesity, diabetes, and hypertension syndrome ([facility])   Obstructive sleep apnea   Diabetes mellitus ([place])   Hyperlipidemia   GERD (gastroesophageal reflux disease)   Acute sepsis ([facility])   Osteomyelitis of fifth toe of right foot ([place])   DKA (diabetic ketoacidosis) ([place])   S/P foot surgery, right  Discharge Instructions Discharge Instructions    Call MD for:  persistant nausea and vomiting   Complete by: As directed    Call MD for:  redness, tenderness, or signs of infection (pain, swelling, redness, odor or green/yellow discharge around incision site)   Complete by: As directed    Call MD for:  temperature >100.4   Complete by: As directed  Diet Carb Modified   Complete by: As directed    Home infusion instructions   Complete by: As directed    Instructions: Flushing of vascular access device: 0.9% NaCl pre/post medication administration and prn patency; Heparin 100 u/ml, 69m for implanted ports and Heparin  10u/ml, 547mfor all other central venous catheters.   Increase activity slowly   Complete by: As directed      Allergies as of 03/17/2019   No Known Allergies     Medication List    STOP taking these medications   sulfamethoxazole-trimethoprim 400-80 MG tablet Commonly known as: Bactrim     TAKE these medications   AeroChamber MV inhaler Use as instructed   amLODipine 5 MG tablet Commonly known as: NORVASC Take 5 mg by mouth daily.   aspirin EC 81 MG tablet Take 81 mg by mouth daily.   BD Insulin Syringe U/F 31G X 5/16" 1 ML Misc Generic drug: Insulin Syringe-Needle U-100 See admin instructions.   ceFAZolin  IVPB Commonly known as: ANCEF Inject 2 g into the vein every 8 (eight) hours. Indication:  Foot wound complicated by abscess/cellulitis and MSSA bacteremia Last Day of Therapy:  04/15/2019 Labs - Once weekly:  CBC/D and BMP, Labs - Every other week:  ESR and CRP   cholecalciferol 25 MCG (1000 UNIT) tablet Commonly known as: VITAMIN D3 Take 1,000 Units by mouth daily.   glimepiride 4 MG tablet Commonly known as: AMARYL Take 4 mg by mouth daily with breakfast.   insulin NPH-regular Human (70-30) 100 UNIT/ML injection Inject 40-50 Units into the skin 2 (two) times daily with a meal.   loratadine 10 MG tablet Commonly known as: CLARITIN Take 10 mg by mouth daily.   metFORMIN 500 MG 24 hr tablet Commonly known as: GLUCOPHAGE-XR Take 1,000 mg by mouth 2 (two) times daily.   montelukast 10 MG tablet Commonly known as: SINGULAIR TAKE 1 TABLET BY MOUTH AT BEDTIME   pantoprazole 40 MG tablet Commonly known as: PROTONIX TAKE 1 TABLET BY MOUTH TWICE DAILY   simvastatin 40 MG tablet Commonly known as: ZOCOR Take 40 mg by mouth every evening.   vitamin B-12 1000 MCG tablet Commonly known as: CYANOCOBALAMIN Take 1,000 mcg by mouth daily.            Home Infusion Instuctions  (From admission, onward)         Start     Ordered   03/17/19 0000  Home  infusion instructions    Question:  Instructions  Answer:  Flushing of vascular access device: 0.9% NaCl pre/post medication administration and prn patency; Heparin 100 u/ml, 22m28mor implanted ports and Heparin 10u/ml, 22ml57mr all other central venous catheters.   03/17/19 1402         Follow-up Information    Van Tommy MedalrnLavell Islam Follow up on 04/15/2019.   Specialty: Infectious Diseases Why: 11:30 am appointment. Please arrive 15 minutes prior to your appointment.  Contact information: [phone]    [facility] Follow up.   Why: HHRN for iv abx Contact information: [phone]     No Known Allergies  Consultations:  ID  Cardiology  Podiatry  Procedures/Studies: DG Chest Portable 1 View  Result Date: 03/12/2019 CLINICAL DATA:  Shortness of breath, fever, cough EXAM: PORTABLE CHEST 1 VIEW COMPARISON:  CT 11/30/2015, radiograph 09/26/2015 FINDINGS: Accounting for some atelectasis in the bases and patient  body habitus, the lungs are clear. No consolidation, features of edema, pneumothorax, or effusion. The cardiomediastinal contours are unremarkable. No acute osseous or soft tissue abnormality. IMPRESSION: Accounting for body habitus, the lungs are clear. Electronically Signed   By: Lovena Le M.D.   On: 03/12/2019 23:47   DG Foot 2 Views Right  Result Date: 03/14/2019 CLINICAL DATA:  Post right foot surgery. 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 Evidence of recent fifth toe amputation including the metatarsal and phalanges. There is a well-defined horizontal lucency over the base of the fourth metatarsal likely postsurgical. Postsurgical soft tissue changes over the lateral mid to forefoot. Remainder the exam is unchanged. IMPRESSION: 1. Postsurgical changes compatible recent fifth  toe amputation. Smooth well-defined horizontal lucency over the base of the fourth metatarsal also likely postsurgical. 2.  Stable postsurgical changes over the first and second phalanges. Electronically Signed   By: Marin Olp M.D.   On: 03/14/2019 11:33   ECHO TEE  Result Date: 03/17/2019   TRANSESOPHOGEAL ECHO REPORT   Patient Name:   [name] Date of Exam: 03/17/2019 Medical Rec #:  [id number]       Height:       71.0 in Accession #:    [id number]      Weight:       326.7 lb Date of Birth:  14-Sep-1960       BSA:          2.60 m Patient Age:    59 years        BP:           145/71 mmHg Patient Gender: M               HR:           73 bpm. Exam Location:  Inpatient  Procedure: Transesophageal Echo, Color Doppler and Cardiac Doppler Indications:     Bacteremia  History:         Patient has prior history of Echocardiogram examinations, most                  recent 11/28/2015.  Sonographer:     Raquel Sarna Senior RDCS Referring Phys:  [id number] Daune Perch Diagnosing Phys: Skeet Latch MD  PROCEDURE: Normal Transesophogeal exam. No source of intracardiac mass. No evidence of intracardiac thrombus. No evidence of vegetation. Consent was requested emergently by emergency room physicain. After discussion of the risks and benefits of a TEE, an  informed consent was obtained from the patient. Local oropharyngeal anesthetic was provided with viscous lidocaine. Patients was monitored while under deep sedation. The transesophogeal probe was passed through the esophogus of the patient. Imaged were obtained with the patient in a left lateral decubitus position. Image quality was excellent. The patient's vital signs; including heart rate, blood pressure, and oxygen saturation; remained stable throughout the procedure. The patient developed no complications during the procedure. IMPRESSIONS  1. Left ventricular ejection fraction, by visual estimation, is 60 to 65%. The left ventricle has normal function. Left  ventricular septal wall thickness was normal. Normal left ventricular posterior wall thickness. There is no left ventricular hypertrophy.  2. The left ventricle has no regional wall motion abnormalities.  3. Global right ventricle has normal systolic function.The right ventricular size is normal. No increase in right ventricular wall thickness.  4. Left atrial size was normal.  5. Right  atrial size was normal.  6. The mitral valve is normal in structure. Mild mitral valve regurgitation. No evidence of mitral stenosis.  7. No trisuspid valve vegetation visualized.  8. The tricuspid valve is normal in structure.  9. The tricuspid valve is normal in structure. Tricuspid valve regurgitation is not demonstrated. 10. The aortic valve is normal in structure. Aortic valve regurgitation is not visualized. No evidence of aortic valve sclerosis or stenosis. 11. The pulmonic valve was normal in structure. Pulmonic valve regurgitation is not visualized. 12. The inferior vena cava is normal in size with greater than 50% respiratory variability, suggesting right atrial pressure of 3 mmHg. FINDINGS  Left Ventricle: Left ventricular ejection fraction, by visual estimation, is 60 to 65%. The left ventricle has normal function. The left ventricle has no regional wall motion abnormalities. Left ventricular septal wall thickness was normal. Normal left ventricular posterior wall thickness. There is no left ventricular hypertrophy. Right Ventricle: The right ventricular size is normal. No increase in right ventricular wall thickness. Global RV systolic function is has normal systolic function. Left Atrium: Left atrial size was normal in size. Right Atrium: Right atrial size was normal in size Pericardium: There is no evidence of pericardial effusion. Mitral Valve: The mitral valve is normal in structure. No evidence of mitral valve stenosis by observation. Mild mitral valve regurgitation. Tricuspid Valve: The tricuspid valve is normal in  structure. Tricuspid valve regurgitation is not demonstrated. No TV vegetation was visualized. Aortic Valve: The aortic valve is normal in structure. Aortic valve regurgitation is not visualized. The aortic valve is structurally normal, with no evidence of sclerosis or stenosis. Pulmonic Valve: The pulmonic valve was normal in structure. Pulmonic valve regurgitation is not visualized. Aorta: The aortic root, ascending aorta and aortic arch are all structurally normal, with no evidence of dilitation or obstruction. Venous: The inferior vena cava is normal in size with greater than 50% respiratory variability, suggesting right atrial pressure of 3 mmHg. Shunts: There is no evidence of a patent foramen ovale. No ventricular septal defect is seen or detected. There is no evidence of an atrial septal defect. No atrial level shunt detected by color flow Doppler.  Skeet Latch MD Electronically signed by Skeet Latch MD Signature Date/Time: 03/17/2019/12:51:08 PM    Final    [place] EKG SITE RITE  Result Date: 03/17/2019 If Site Rite image not attached, placement could not be confirmed due to current cardiac rhythm.  TEE: no vegetations  Subjective: Patient feeling well overall, wants to go home today if at all possible. Getting TEE around noon. Denies new concerns or complaints.   Discharge Exam: Vitals:   03/17/19 1300 03/17/19 1352  BP: (!) 157/70 (!) 154/75  Pulse: 74 76  Resp: (!) 25   Temp:  98.2 F (36.8 C)  SpO2: 97% 96%   Vitals:   03/17/19 1240 03/17/19 1257 03/17/19 1300 03/17/19 1352  BP: (!) 137/59 138/88 (!) 157/70 (!) 154/75  Pulse: 72 72 74 76  Resp: (!) 31 (!) 33 (!) 25   Temp: 97.8 F (36.6 C)   98.2 F (36.8 C)  TempSrc: Oral   Oral  SpO2: 99% 98% 97% 96%  Weight:      Height:        General: Pt is alert, awake, not in acute distress, obese Cardiovascular: RRR, S1/S2 +, no rubs, no gallops Respiratory: CTA bilaterally, no wheezing, no rhonchi Abdominal: Soft, NT, ND,  bowel sounds + Extremities: no edema, no cyanosis  The results  of significant diagnostics from this hospitalization (including imaging, microbiology, ancillary and laboratory) are listed below for reference.    Microbiology: Recent Results (from the past 240 hour(s))  Blood culture (routine x 2)     Status: Abnormal   Collection Time: 03/13/19 12:11 AM   Specimen: BLOOD  Result Value Ref Range Status   Specimen Description BLOOD LEFT FOREARM  Final   Special Requests   Final    BOTTLES DRAWN AEROBIC AND ANAEROBIC Blood Culture adequate volume   Culture  Setup Time   Final    GRAM POSITIVE COCCI IN CLUSTERS IN BOTH AEROBIC AND ANAEROBIC BOTTLES CRITICAL RESULT CALLED TO, READ BACK BY AND VERIFIED WITH: Hughie Closs [facility] 03/13/19 1829 JDW    Culture (A)  Final    STAPHYLOCOCCUS AUREUS SUSCEPTIBILITIES PERFORMED ON PREVIOUS CULTURE WITHIN THE LAST 5 DAYS. Performed at [facility], [place] [address] [place], [place] [address]    Report Status 03/15/2019 FINAL  Final  Respiratory Panel by RT PCR (Flu A&B, Covid) - Nasopharyngeal Swab     Status: None   Collection Time: 03/13/19 12:11 AM   Specimen: Nasopharyngeal Swab  Result Value Ref Range Status   SARS Coronavirus 2 by RT PCR NEGATIVE NEGATIVE Final    Comment: (NOTE) SARS-CoV-2 target nucleic acids are NOT DETECTED. The SARS-CoV-2 RNA is generally detectable in upper respiratoy specimens during the acute phase of infection. The lowest concentration of SARS-CoV-2 viral copies this assay can detect is 131 copies/mL. A negative result does not preclude SARS-Cov-2 infection and should not be used as the sole basis for treatment or other patient management decisions. A negative result may occur with  improper specimen collection/handling, submission of specimen other than nasopharyngeal swab, presence of viral mutation(s) within the areas targeted by this assay, and inadequate number of viral copies (<131 copies/mL). A negative  result must be combined with clinical observations, patient history, and epidemiological information. The expected result is Negative. Fact Sheet for Patients:  PinkCheek.be Fact Sheet for Healthcare Providers:  GravelBags.it This test is not yet ap proved or cleared by the [place] FDA and  has been authorized for detection and/or diagnosis of SARS-CoV-2 by FDA under an Emergency Use Authorization (EUA). This EUA will remain  in effect (meaning this test can be used) for the duration of the COVID-19 declaration under Section 564(b)(1) of the Act, 21 U.S.C. section 360bbb-3(b)(1), unless the authorization is terminated or revoked sooner.    Influenza A by PCR NEGATIVE NEGATIVE Final   Influenza B by PCR NEGATIVE NEGATIVE Final    Comment: (NOTE) The Xpert Xpress SARS-CoV-2/FLU/RSV assay is intended as an aid in  the diagnosis of influenza from Nasopharyngeal swab specimens and  should not be used as a sole basis for treatment. Nasal washings and  aspirates are unacceptable for Xpert Xpress SARS-CoV-2/FLU/RSV  testing. Fact Sheet for Patients: PinkCheek.be Fact Sheet for Healthcare Providers: GravelBags.it This test is not yet approved or cleared by the [place] FDA and  has been authorized for detection and/or diagnosis of SARS-CoV-2 by  FDA under an Emergency Use Authorization (EUA). This EUA will remain  in effect (meaning this test can be used) for the duration of the  Covid-19 declaration under Section 564(b)(1) of the Act, 21  U.S.C. section 360bbb-3(b)(1), unless the authorization is  terminated or revoked. Performed at [facility], [place] [address] [place], [place] [address]   Blood culture (routine x 2)     Status: Abnormal  Collection Time: 03/13/19 12:36 AM   Specimen: BLOOD  Result Value Ref Range Status   Specimen Description BLOOD  RIGHT HAND  Final   Special Requests   Final    BOTTLES DRAWN AEROBIC AND ANAEROBIC Blood Culture results may not be optimal due to an inadequate volume of blood received in culture bottles   Culture  Setup Time   Final    GRAM POSITIVE COCCI IN CLUSTERS ANAEROBIC BOTTLE ONLY CRITICAL RESULT CALLED TO, READ BACK BY AND VERIFIED WITH: Hughie Closs [facility] 03/13/19 1829 JDW Performed at [facility], [place] [address] [place], [place] [address]    Culture STAPHYLOCOCCUS AUREUS (A)  Final   Report Status 03/15/2019 FINAL  Final   Organism ID, Bacteria STAPHYLOCOCCUS AUREUS  Final      Susceptibility   Staphylococcus aureus - MIC*    CIPROFLOXACIN <=0.5 SENSITIVE Sensitive     ERYTHROMYCIN >=8 RESISTANT Resistant     GENTAMICIN <=0.5 SENSITIVE Sensitive     OXACILLIN <=0.25 SENSITIVE Sensitive     TETRACYCLINE >=16 RESISTANT Resistant     VANCOMYCIN 1 SENSITIVE Sensitive     TRIMETH/SULFA 80 RESISTANT Resistant     CLINDAMYCIN <=0.25 SENSITIVE Sensitive     RIFAMPIN <=0.5 SENSITIVE Sensitive     Inducible Clindamycin NEGATIVE Sensitive     * STAPHYLOCOCCUS AUREUS  Blood Culture ID Panel (Reflexed)     Status: Abnormal   Collection Time: 03/13/19 12:36 AM  Result Value Ref Range Status   Enterococcus species NOT DETECTED NOT DETECTED Final   Listeria monocytogenes NOT DETECTED NOT DETECTED Final   Staphylococcus species DETECTED (A) NOT DETECTED Final    Comment: CRITICAL RESULT CALLED TO, READ BACK BY AND VERIFIED WITH: Hughie Closs [facility] 03/13/19 1829 JDW    Staphylococcus aureus (BCID) DETECTED (A) NOT DETECTED Final    Comment: Methicillin (oxacillin) susceptible Staphylococcus aureus (MSSA). Preferred therapy is anti staphylococcal beta lactam antibiotic (Cefazolin or Nafcillin), unless clinically contraindicated. CRITICAL RESULT CALLED TO, READ BACK BY AND VERIFIED WITH: Hughie Closs [facility] 03/13/19 1829 JDW    Methicillin resistance NOT DETECTED NOT DETECTED Final   Streptococcus  species NOT DETECTED NOT DETECTED Final   Streptococcus agalactiae NOT DETECTED NOT DETECTED Final   Streptococcus pneumoniae NOT DETECTED NOT DETECTED Final   Streptococcus pyogenes NOT DETECTED NOT DETECTED Final   Acinetobacter baumannii NOT DETECTED NOT DETECTED Final   Enterobacteriaceae species NOT DETECTED NOT DETECTED Final   Enterobacter cloacae complex NOT DETECTED NOT DETECTED Final   Escherichia coli NOT DETECTED NOT DETECTED Final   Klebsiella oxytoca NOT DETECTED NOT DETECTED Final   Klebsiella pneumoniae NOT DETECTED NOT DETECTED Final   Proteus species NOT DETECTED NOT DETECTED Final   Serratia marcescens NOT DETECTED NOT DETECTED Final   Haemophilus influenzae NOT DETECTED NOT DETECTED Final   Neisseria meningitidis NOT DETECTED NOT DETECTED Final   Pseudomonas aeruginosa NOT DETECTED NOT DETECTED Final   Candida albicans NOT DETECTED NOT DETECTED Final   Candida glabrata NOT DETECTED NOT DETECTED Final   Candida krusei NOT DETECTED NOT DETECTED Final   Candida parapsilosis NOT DETECTED NOT DETECTED Final   Candida tropicalis NOT DETECTED NOT DETECTED Final    Comment: Performed at [facility], [place] [address], [place],  [address]  Urine culture     Status: None   Collection Time: 03/13/19  3:52 AM   Specimen: Urine, Clean Catch  Result Value Ref Range Status   Specimen Description URINE, CLEAN CATCH  Final   Special Requests NONE  Final   Culture   Final    NO GROWTH Performed at [facility], [place] [address] [place], [place] [address]    Report Status 03/13/2019 FINAL  Final  Surgical pcr screen     Status: None   Collection Time: 03/13/19 10:31 PM   Specimen: Nasal Mucosa; Nasal Swab  Result Value Ref Range Status   MRSA, PCR NEGATIVE NEGATIVE Final   Staphylococcus aureus NEGATIVE NEGATIVE Final    Comment: (NOTE) The Xpert SA Assay (FDA approved for NASAL specimens in patients 23 years of age and older), is one component of a  comprehensive surveillance program. It is not intended to diagnose infection nor to guide or monitor treatment. Performed at [facility], [place] [address] [place], [place] [address]   Aerobic/Anaerobic Culture (surgical/deep wound)     Status: None (Preliminary result)   Collection Time: 03/14/19  9:16 AM   Specimen: PATH Other; Tissue  Result Value Ref Range Status   Specimen Description WOUND FIFTH TOE ULCER  Final   Special Requests NONE  Final   Gram Stain   Final    RARE WBC PRESENT,BOTH PMN AND MONONUCLEAR NO ORGANISMS SEEN Performed at [facility], [address], [place], [place] [address]    Culture   Final    FEW ESCHERICHIA COLI FEW STAPHYLOCOCCUS AUREUS FEW STREPTOCOCCUS ANGINOSIS NO ANAEROBES ISOLATED; CULTURE IN PROGRESS FOR 5 DAYS    Report Status PENDING  Incomplete   Organism ID, Bacteria ESCHERICHIA COLI  Final   Organism ID, Bacteria STAPHYLOCOCCUS AUREUS  Final   Organism ID, Bacteria STREPTOCOCCUS ANGINOSIS  Final      Susceptibility   Escherichia coli - MIC*    AMPICILLIN >=32 RESISTANT Resistant     CEFAZOLIN <=4 SENSITIVE Sensitive     CEFEPIME <=0.12 SENSITIVE Sensitive     CEFTAZIDIME <=1 SENSITIVE Sensitive     CEFTRIAXONE <=0.25 SENSITIVE Sensitive     CIPROFLOXACIN <=0.25 SENSITIVE Sensitive     GENTAMICIN <=1 SENSITIVE Sensitive     IMIPENEM <=0.25 SENSITIVE Sensitive     TRIMETH/SULFA >=320 RESISTANT Resistant     AMPICILLIN/SULBACTAM 16 INTERMEDIATE Intermediate     PIP/TAZO <=4 SENSITIVE Sensitive     * FEW ESCHERICHIA COLI   Staphylococcus aureus - MIC*    CIPROFLOXACIN <=0.5 SENSITIVE Sensitive     ERYTHROMYCIN >=8 RESISTANT Resistant     GENTAMICIN <=0.5 SENSITIVE Sensitive     OXACILLIN 0.5 SENSITIVE Sensitive     TETRACYCLINE >=16 RESISTANT Resistant     VANCOMYCIN 1 SENSITIVE Sensitive     TRIMETH/SULFA 80 RESISTANT Resistant     CLINDAMYCIN <=0.25 SENSITIVE Sensitive     RIFAMPIN <=0.5 SENSITIVE Sensitive      Inducible Clindamycin NEGATIVE Sensitive     * FEW STAPHYLOCOCCUS AUREUS   Streptococcus anginosis - MIC*    PENICILLIN <=0.06 SENSITIVE Sensitive     CEFTRIAXONE 0.25 SENSITIVE Sensitive     ERYTHROMYCIN >=8 RESISTANT Resistant     LEVOFLOXACIN 1 SENSITIVE Sensitive     VANCOMYCIN 0.5 SENSITIVE Sensitive     * FEW STREPTOCOCCUS ANGINOSIS  Culture, blood (Routine X 2) w Reflex to ID Panel     Status: None (Preliminary result)   Collection Time: 03/15/19 11:24 AM   Specimen: BLOOD  Result Value Ref Range Status   Specimen Description BLOOD RIGHT ANTECUBITAL  Final   Special Requests   Final    BOTTLES DRAWN AEROBIC  AND ANAEROBIC Blood Culture adequate volume Performed at [place] [address] [place], [place] [address]    Culture NO GROWTH 2 DAYS  Final   Report Status PENDING  Incomplete  Culture, blood (Routine X 2) w Reflex to ID Panel     Status: None (Preliminary result)   Collection Time: 03/15/19 11:40 AM   Specimen: BLOOD LEFT HAND  Result Value Ref Range Status   Specimen Description BLOOD LEFT HAND  Final   Special Requests   Final    BOTTLES DRAWN AEROBIC AND ANAEROBIC Blood Culture adequate volume Performed at [facility], [place] [address], [place], [place] [address]    Culture NO GROWTH 2 DAYS  Final   Report Status PENDING  Incomplete     Labs: BNP (last 3 results) No results for input(s): BNP in the last 8760 hours. Basic Metabolic Panel: Recent Labs  Lab 03/13/19 0652 03/14/19 0451 03/15/19 0456 03/16/19 0222 03/17/19 0350  NA 131* 134* 136 135 136  K 3.6 3.7 3.6 3.7 4.5  CL 96* 98 102 102 101  CO2 '24 26 25 24 27  ' GLUCOSE 320* 149* 195* 221* 249*  BUN 29* 21* '17 17 15  ' CREATININE 1.96* 1.66* 1.56* 1.43* 1.37*  CALCIUM 8.6* 8.2* 7.9* 7.7* 7.9*  MG 1.9 1.7 1.9 1.8 2.1   Liver Function Tests: Recent Labs  Lab 03/13/19 0652 03/14/19 0451 03/15/19 0456 03/16/19 0222 03/17/19 0350  AST 32 71* 69* 51* 45*  ALT 25 39 55* 52* 48*   ALKPHOS 82 84 103 126 159*  BILITOT 1.3* 1.2 1.0 1.0 0.6  PROT 6.9 5.9* 5.4* 5.7* 5.6*  ALBUMIN 2.7* 2.2* 2.0* 2.0* 1.9*   No results for input(s): LIPASE, AMYLASE in the last 168 hours. No results for input(s): AMMONIA in the last 168 hours. CBC: Recent Labs  Lab 03/12/19 2243 03/12/19 2243 03/13/19 2536 03/14/19 0451 03/15/19 0456 03/16/19 0222 03/17/19 0350  WBC 15.5*   < > 10.2 6.4 6.7 8.2 9.1  NEUTROABS 13.6*  --   --   --   --   --   --   HGB 14.5   < > 14.1 12.7* 11.5* 11.4* 10.8*  HCT 43.2   < > 42.3 38.2* 34.3* 35.0* 33.4*  MCV 86.2   < > 86.3 86.6 85.8 87.5 88.4  PLT 269   < > 246 202 165 188 235   < > = values in this interval not displayed.   Cardiac Enzymes: No results for input(s): CKTOTAL, CKMB, CKMBINDEX, TROPONINI in the last 168 hours. BNP: Invalid input(s): POCBNP CBG: Recent Labs  Lab 03/16/19 1720 03/16/19 2056 03/17/19 0553 03/17/19 1059 03/17/19 1349  GLUCAP 248* 325* 223* 209* 192*   D-Dimer No results for input(s): DDIMER in the last 72 hours. Hgb A1c No results for input(s): HGBA1C in the last 72 hours. Lipid Profile No results for input(s): CHOL, HDL, LDLCALC, TRIG, CHOLHDL, LDLDIRECT in the last 72 hours. Thyroid function studies No results for input(s): TSH, T4TOTAL, T3FREE, THYROIDAB in the last 72 hours.  Invalid input(s): FREET3 Anemia work up No results for input(s): VITAMINB12, FOLATE, FERRITIN, TIBC, IRON, RETICCTPCT in the last 72 hours. Urinalysis    Component Value Date/Time   COLORURINE YELLOW 03/13/2019 0349   APPEARANCEUR HAZY (A) 03/13/2019 0349   LABSPEC 1.021 03/13/2019 0349   PHURINE 5.0 03/13/2019 0349   GLUCOSEU >=500 (A) 03/13/2019 0349   HGBUR LARGE (A) 03/13/2019 0349   BILIRUBINUR NEGATIVE  03/13/2019 0349   KETONESUR 5 (A) 03/13/2019 0349   PROTEINUR 100 (A) 03/13/2019 0349   NITRITE NEGATIVE 03/13/2019 0349   LEUKOCYTESUR NEGATIVE 03/13/2019 0349   Sepsis Labs Invalid input(s): PROCALCITONIN,   WBC,  LACTICIDVEN Microbiology Recent Results (from the past 240 hour(s))  Blood culture (routine x 2)     Status: Abnormal   Collection Time: 03/13/19 12:11 AM   Specimen: BLOOD  Result Value Ref Range Status   Specimen Description BLOOD LEFT FOREARM  Final   Special Requests   Final    BOTTLES DRAWN AEROBIC AND ANAEROBIC Blood Culture adequate volume   Culture  Setup Time   Final    GRAM POSITIVE COCCI IN CLUSTERS IN BOTH AEROBIC AND ANAEROBIC BOTTLES CRITICAL RESULT CALLED TO, READ BACK BY AND VERIFIED WITH: Hughie Closs [facility] 03/13/19 1829 JDW    Culture (A)  Final    STAPHYLOCOCCUS AUREUS SUSCEPTIBILITIES PERFORMED ON PREVIOUS CULTURE WITHIN THE LAST 5 DAYS. Performed at [facility], [place] [address], [place], [place] [address]    Report Status 03/15/2019 FINAL  Final  Respiratory Panel by RT PCR (Flu A&B, Covid) - Nasopharyngeal Swab     Status: None   Collection Time: 03/13/19 12:11 AM   Specimen: Nasopharyngeal Swab  Result Value Ref Range Status   SARS Coronavirus 2 by RT PCR NEGATIVE NEGATIVE Final    Comment: (NOTE) SARS-CoV-2 target nucleic acids are NOT DETECTED. The SARS-CoV-2 RNA is generally detectable in upper respiratoy specimens during the acute phase of infection. The lowest concentration of SARS-CoV-2 viral copies this assay can detect is 131 copies/mL. A negative result does not preclude SARS-Cov-2 infection and should not be used as the sole basis for treatment or other patient management decisions. A negative result may occur with  improper specimen collection/handling, submission of specimen other than nasopharyngeal swab, presence of viral mutation(s) within the areas targeted by this assay, and inadequate number of viral copies (<131 copies/mL). A negative result must be combined with clinical observations, patient history, and epidemiological information. The expected result is Negative. Fact Sheet for Patients:   PinkCheek.be Fact Sheet for Healthcare Providers:  GravelBags.it This test is not yet ap proved or cleared by the [place] FDA and  has been authorized for detection and/or diagnosis of SARS-CoV-2 by FDA under an Emergency Use Authorization (EUA). This EUA will remain  in effect (meaning this test can be used) for the duration of the COVID-19 declaration under Section 564(b)(1) of the Act, 21 U.S.C. section 360bbb-3(b)(1), unless the authorization is terminated or revoked sooner.    Influenza A by PCR NEGATIVE NEGATIVE Final   Influenza B by PCR NEGATIVE NEGATIVE Final    Comment: (NOTE) The Xpert Xpress SARS-CoV-2/FLU/RSV assay is intended as an aid in  the diagnosis of influenza from Nasopharyngeal swab specimens and  should not be used as a sole basis for treatment. Nasal washings and  aspirates are unacceptable for Xpert Xpress SARS-CoV-2/FLU/RSV  testing. Fact Sheet for Patients: PinkCheek.be Fact Sheet for Healthcare Providers: GravelBags.it This test is not yet approved or cleared by the [place] FDA and  has been authorized for detection and/or diagnosis of SARS-CoV-2 by  FDA under an Emergency Use Authorization (EUA). This EUA will remain  in effect (meaning this test can be used) for the duration of the  Covid-19 declaration under Section 564(b)(1) of the Act, 21  U.S.C. section 360bbb-3(b)(1), unless the authorization is  terminated or revoked. Performed at [facility],  [address], [place], [place] [address]   Blood culture (routine x 2)     Status: Abnormal   Collection Time: 03/13/19 12:36 AM   Specimen: BLOOD  Result Value Ref Range Status   Specimen Description BLOOD RIGHT HAND  Final   Special Requests   Final    BOTTLES DRAWN AEROBIC AND ANAEROBIC Blood Culture results may not be optimal due to an inadequate volume of blood  received in culture bottles   Culture  Setup Time   Final    GRAM POSITIVE COCCI IN CLUSTERS ANAEROBIC BOTTLE ONLY CRITICAL RESULT CALLED TO, READ BACK BY AND VERIFIED WITH: Hughie Closs [facility] 03/13/19 1829 JDW Performed at [facility], [place] [address] [place], [place] [address]    Culture STAPHYLOCOCCUS AUREUS (A)  Final   Report Status 03/15/2019 FINAL  Final   Organism ID, Bacteria STAPHYLOCOCCUS AUREUS  Final      Susceptibility   Staphylococcus aureus - MIC*    CIPROFLOXACIN <=0.5 SENSITIVE Sensitive     ERYTHROMYCIN >=8 RESISTANT Resistant     GENTAMICIN <=0.5 SENSITIVE Sensitive     OXACILLIN <=0.25 SENSITIVE Sensitive     TETRACYCLINE >=16 RESISTANT Resistant     VANCOMYCIN 1 SENSITIVE Sensitive     TRIMETH/SULFA 80 RESISTANT Resistant     CLINDAMYCIN <=0.25 SENSITIVE Sensitive     RIFAMPIN <=0.5 SENSITIVE Sensitive     Inducible Clindamycin NEGATIVE Sensitive     * STAPHYLOCOCCUS AUREUS  Blood Culture ID Panel (Reflexed)     Status: Abnormal   Collection Time: 03/13/19 12:36 AM  Result Value Ref Range Status   Enterococcus species NOT DETECTED NOT DETECTED Final   Listeria monocytogenes NOT DETECTED NOT DETECTED Final   Staphylococcus species DETECTED (A) NOT DETECTED Final    Comment: CRITICAL RESULT CALLED TO, READ BACK BY AND VERIFIED WITH: Hughie Closs [facility] 03/13/19 1829 JDW    Staphylococcus aureus (BCID) DETECTED (A) NOT DETECTED Final    Comment: Methicillin (oxacillin) susceptible Staphylococcus aureus (MSSA). Preferred therapy is anti staphylococcal beta lactam antibiotic (Cefazolin or Nafcillin), unless clinically contraindicated. CRITICAL RESULT CALLED TO, READ BACK BY AND VERIFIED WITH: Hughie Closs [facility] 03/13/19 1829 JDW    Methicillin resistance NOT DETECTED NOT DETECTED Final   Streptococcus species NOT DETECTED NOT DETECTED Final   Streptococcus agalactiae NOT DETECTED NOT DETECTED Final   Streptococcus pneumoniae NOT DETECTED NOT DETECTED Final    Streptococcus pyogenes NOT DETECTED NOT DETECTED Final   Acinetobacter baumannii NOT DETECTED NOT DETECTED Final   Enterobacteriaceae species NOT DETECTED NOT DETECTED Final   Enterobacter cloacae complex NOT DETECTED NOT DETECTED Final   Escherichia coli NOT DETECTED NOT DETECTED Final   Klebsiella oxytoca NOT DETECTED NOT DETECTED Final   Klebsiella pneumoniae NOT DETECTED NOT DETECTED Final   Proteus species NOT DETECTED NOT DETECTED Final   Serratia marcescens NOT DETECTED NOT DETECTED Final   Haemophilus influenzae NOT DETECTED NOT DETECTED Final   Neisseria meningitidis NOT DETECTED NOT DETECTED Final   Pseudomonas aeruginosa NOT DETECTED NOT DETECTED Final   Candida albicans NOT DETECTED NOT DETECTED Final   Candida glabrata NOT DETECTED NOT DETECTED Final   Candida krusei NOT DETECTED NOT DETECTED Final   Candida parapsilosis NOT DETECTED NOT DETECTED Final   Candida tropicalis NOT DETECTED NOT DETECTED Final    Comment: Performed at [facility], [place] [address] [place], [place] [address]  Urine culture     Status: None   Collection Time: 03/13/19  3:52 AM   Specimen: Urine, Clean Catch  Result Value Ref Range Status   Specimen Description URINE, CLEAN CATCH  Final   Special Requests NONE  Final   Culture   Final    NO GROWTH Performed at [facility], [address], [place], [place] [address]    Report Status 03/13/2019 FINAL  Final  Surgical pcr screen     Status: None   Collection Time: 03/13/19 10:31 PM   Specimen: Nasal Mucosa; Nasal Swab  Result Value Ref Range Status   MRSA, PCR NEGATIVE NEGATIVE Final   Staphylococcus aureus NEGATIVE NEGATIVE Final    Comment: (NOTE) The Xpert SA Assay (FDA approved for NASAL specimens in patients 2 years of age and older), is one component of a comprehensive surveillance program. It is not intended to diagnose infection nor to guide or monitor treatment. Performed at [facility], [place] [address],  [place], [place] [address]   Aerobic/Anaerobic Culture (surgical/deep wound)     Status: None (Preliminary result)   Collection Time: 03/14/19  9:16 AM   Specimen: PATH Other; Tissue  Result Value Ref Range Status   Specimen Description WOUND FIFTH TOE ULCER  Final   Special Requests NONE  Final   Gram Stain   Final    RARE WBC PRESENT,BOTH PMN AND MONONUCLEAR NO ORGANISMS SEEN Performed at [facility], [address] [place], [place] [address]    Culture   Final    FEW ESCHERICHIA COLI FEW STAPHYLOCOCCUS AUREUS FEW STREPTOCOCCUS ANGINOSIS NO ANAEROBES ISOLATED; CULTURE IN PROGRESS FOR 5 DAYS    Report Status PENDING  Incomplete   Organism ID, Bacteria ESCHERICHIA COLI  Final   Organism ID, Bacteria STAPHYLOCOCCUS AUREUS  Final   Organism ID, Bacteria STREPTOCOCCUS ANGINOSIS  Final      Susceptibility   Escherichia coli - MIC*    AMPICILLIN >=32 RESISTANT Resistant     CEFAZOLIN <=4 SENSITIVE Sensitive     CEFEPIME <=0.12 SENSITIVE Sensitive     CEFTAZIDIME <=1 SENSITIVE Sensitive     CEFTRIAXONE <=0.25 SENSITIVE Sensitive     CIPROFLOXACIN <=0.25 SENSITIVE Sensitive     GENTAMICIN <=1 SENSITIVE Sensitive     IMIPENEM <=0.25 SENSITIVE Sensitive     TRIMETH/SULFA >=320 RESISTANT Resistant     AMPICILLIN/SULBACTAM 16 INTERMEDIATE Intermediate     PIP/TAZO <=4 SENSITIVE Sensitive     * FEW ESCHERICHIA COLI   Staphylococcus aureus - MIC*    CIPROFLOXACIN <=0.5 SENSITIVE Sensitive     ERYTHROMYCIN >=8 RESISTANT Resistant     GENTAMICIN <=0.5 SENSITIVE Sensitive     OXACILLIN 0.5 SENSITIVE Sensitive     TETRACYCLINE >=16 RESISTANT Resistant     VANCOMYCIN 1 SENSITIVE Sensitive     TRIMETH/SULFA 80 RESISTANT Resistant     CLINDAMYCIN <=0.25 SENSITIVE Sensitive     RIFAMPIN <=0.5 SENSITIVE Sensitive     Inducible Clindamycin NEGATIVE Sensitive     * FEW STAPHYLOCOCCUS AUREUS   Streptococcus anginosis - MIC*    PENICILLIN <=0.06 SENSITIVE Sensitive     CEFTRIAXONE 0.25  SENSITIVE Sensitive     ERYTHROMYCIN >=8 RESISTANT Resistant     LEVOFLOXACIN 1 SENSITIVE Sensitive     VANCOMYCIN 0.5 SENSITIVE Sensitive     * FEW STREPTOCOCCUS ANGINOSIS  Culture, blood (Routine X 2) w Reflex to ID Panel     Status: None (Preliminary result)   Collection Time: 03/15/19 11:24 AM   Specimen: BLOOD  Result Value Ref Range Status  Specimen Description BLOOD RIGHT ANTECUBITAL  Final   Special Requests   Final    BOTTLES DRAWN AEROBIC AND ANAEROBIC Blood Culture adequate volume Performed at [facility], [place] [address], [place], [place] [address]    Culture NO GROWTH 2 DAYS  Final   Report Status PENDING  Incomplete  Culture, blood (Routine X 2) w Reflex to ID Panel     Status: None (Preliminary result)   Collection Time: 03/15/19 11:40 AM   Specimen: BLOOD LEFT HAND  Result Value Ref Range Status   Specimen Description BLOOD LEFT HAND  Final   Special Requests   Final    BOTTLES DRAWN AEROBIC AND ANAEROBIC Blood Culture adequate volume Performed at [facility], [place] [address] [place], [place] [address]    Culture NO GROWTH 2 DAYS  Final   Report Status PENDING  Incomplete   Time coordinating discharge: Over 30 minutes  SIGNED: Hoyt Koch, MD  [facility] 03/17/2019, 2:05 PM Pager   If 7PM-7AM, please contact night-coverage [email or web address] Password [email or web address]

## 2019-03-17 NOTE — Anesthesia Preprocedure Evaluation (Addendum)
Anesthesia Evaluation  Patient identified by MRN, date of birth, ID band Patient awake    Reviewed: Allergy & Precautions, NPO status , Patient's Chart, lab work & pertinent test results  History of Anesthesia Complications Negative for: history of anesthetic complications  Airway Mallampati: II  TM Distance: >3 FB Neck ROM: Full    Dental no notable dental hx.    Pulmonary sleep apnea (s/p UPPP) and Continuous Positive Airway Pressure Ventilation ,    Pulmonary exam normal        Cardiovascular hypertension, Pt. on medications + Past MI and + Cardiac Stents (2012)  Normal cardiovascular exam  TTTE 2017: EF 58-83%, grade 1 diastolic dysfunction   Neuro/Psych negative neurological ROS  negative psych ROS   GI/Hepatic Neg liver ROS, GERD  Medicated,  Endo/Other  diabetes, Type 2, Oral Hypoglycemic Agents, Insulin DependentMorbid obesity  Renal/GU ARFRenal disease (Cr 1.37)  negative genitourinary   Musculoskeletal  (+) Arthritis ,   Abdominal   Peds  Hematology Hgb 10.8   Anesthesia Other Findings Osteomyelitis of right foot, bacteremia  Reproductive/Obstetrics negative OB ROS                            Anesthesia Physical Anesthesia Plan  ASA: III  Anesthesia Plan: MAC   Post-op Pain Management:    Induction:   PONV Risk Score and Plan: Treatment may vary due to age or medical condition and Propofol infusion  Airway Management Planned: Natural Airway and Nasal Cannula  Additional Equipment: None  Intra-op Plan:   Post-operative Plan:   Informed Consent: I have reviewed the patients History and Physical, chart, labs and discussed the procedure including the risks, benefits and alternatives for the proposed anesthesia with the patient or authorized representative who has indicated his/her understanding and acceptance.       Plan Discussed with: CRNA  Anesthesia Plan  Comments:        Anesthesia Quick Evaluation

## 2019-03-17 NOTE — Progress Notes (Signed)
Patient left for endoscopy at 1100.  Report from [facility] in Endo at 1258. No bacteremia, mild aortic regurg, mild mitral regurg   1327 RN rounds after return from endo. Patient A&Ox4, no distress, no complaints of pain.   Paged Crawford at [employer]. Patient has returned from endoscopy, ok to eat? add diet orders?  1644 RN to bedside after PICC line insertion completed.   Paged Crawford at [phone]. Please reconcile AVS.Need to print AVS for discharge  Reviewed AVS with patient and provided printed prescription for IV Ancef. Patient confirmed with [facility] IV ABX to be delivered to home.   Discharged via wheelchair by NT at 1730; patient's girlfriend present.

## 2019-03-17 NOTE — Transfer of Care (Signed)
Immediate Anesthesia Transfer of Care Note  Patient: [name]  Procedure(s) Performed: TRANSESOPHAGEAL ECHOCARDIOGRAM (TEE) (N/A )  Patient Location: Endoscopy Unit  Anesthesia Type:MAC  Level of Consciousness: awake, drowsy and patient cooperative  Airway & Oxygen Therapy: Patient Spontanous Breathing and Patient connected to nasal cannula oxygen  Post-op Assessment: Report given to RN, Post -op Vital signs reviewed and stable and Patient moving all extremities X 4  Post vital signs: Reviewed and stable  Last Vitals:  Vitals Value Taken Time  BP    Temp    Pulse    Resp    SpO2      Last Pain:  Vitals:   03/17/19 1112  TempSrc: Temporal  PainSc: 0-No pain      Patients Stated Pain Goal: 0 (03/00/92 3300)  Complications: No apparent anesthesia complications

## 2019-03-17 NOTE — Progress Notes (Signed)
Echocardiogram Echocardiogram Transesophageal has been performed.  [name] 03/17/2019, 12:39 PM

## 2019-03-17 NOTE — Anesthesia Postprocedure Evaluation (Signed)
Anesthesia Post Note  Patient: KEYSHON STEIN  Procedure(s) Performed: TRANSESOPHAGEAL ECHOCARDIOGRAM (TEE) (N/A )     Patient location during evaluation: PACU Anesthesia Type: MAC Level of consciousness: awake and alert and oriented Pain management: pain level controlled Vital Signs Assessment: post-procedure vital signs reviewed and stable Respiratory status: spontaneous breathing, nonlabored ventilation and respiratory function stable Cardiovascular status: blood pressure returned to baseline Postop Assessment: no apparent nausea or vomiting Anesthetic complications: no    Last Vitals:  Vitals:   03/17/19 1112 03/17/19 1240  BP: (!) 175/78 (!) 137/59  Pulse: 72 72  Resp: (!) 27 (!) 31  Temp: 36.7 C 36.6 C  SpO2: 95% 99%    Last Pain:  Vitals:   03/17/19 1240  TempSrc: Oral  PainSc: 0-No pain                 Brennan Bailey

## 2019-03-17 NOTE — Progress Notes (Signed)
PHARMACY CONSULT NOTE FOR:  OUTPATIENT  PARENTERAL ANTIBIOTIC THERAPY (OPAT)  Indication: foot wound complicated by abscess/cellulitis and MSSA bacteremia Regimen: cefazolin 2g IV q8h End date: 04/15/2019  IV antibiotic discharge orders are pended. To discharging provider:  please sign these orders via discharge navigator,  Select New Orders & click on the button choice - Manage This Unsigned Work.     Babs Bertin, PharmD, BCPS Please check AMION for all [facility] contact numbers Clinical Pharmacist 03/17/2019 12:10 PM

## 2019-03-17 NOTE — Progress Notes (Addendum)
PROGRESS NOTE    [name]  RRN:[id number] DOB: 06-Dec-1960 DOA: 03/12/2019 PCP: Josetta Huddle, MD   Brief Narrative:  This is a 59 year old male with history of morbid obesity, HLD, HTN, DM 2, right foot osteomyelitis supposed to have surgery Monday, came with chief complaints of fever, generalized weakness, and also has noted his right foot has become more red. He was found to be septic with fever, elevated white count, likely in the setting of right foot osteomyelitis/cellulitis  Interim history: 2 blood cultures positive for MSSA and MRSA, getting TEE to assess for vegetations and awaiting repeat cultures.  Assessment & Plan:   Principal Problem:   MSSA bacteremia Active Problems:   OBESITY   Obesity, diabetes, and hypertension syndrome (HCC)   Obstructive sleep apnea   Diabetes mellitus (HCC)   Hyperlipidemia   GERD (gastroesophageal reflux disease)   Acute sepsis (HCC)   Osteomyelitis of fifth toe of right foot (HCC)   DKA (diabetic ketoacidosis) (HCC)   S/P foot surgery, right   Sepsis due to right foot cellulitis/underlying osteomyelitis -He follows with Dr. Cannon Kettle as an outpatient, underwent an MRI in [place] which showed osteomyelitis and had surgery right foot , 03/14/2019 -Seen by ID,sepsis like picture from MSSA/MRSA bacteremia likely secondary to DFU/osteo/cellulitis, newly MRSA on second blood culture from admission 03/16/19, still awaiting read on repeat cultures from 03/15/19 (ngtd 48 hours) -currently on cefazolin started 03/16/19 by ID due to MRSA -TEE today to assess for vegetations but ID has recommended likely 4 weeks antibiotics even if no vegetations -blood cultures this morning, then PICC line inserted after that -if TEE negative for vegetations will D/C afterwards with plan for 4 weeks antibiotics, if vegetations may need extended course  DM2, hyperglycemia -Continue Lantus, sliding scale  OSA -Nightly CPAP  Acute kidney injury -resolved to  baseline, encouraged oral intake  Elevated troponin -Secondary to demand ischemia, no chest pain, not in a pattern consistent with ACS  Essential hypertension -Hold home medications in the setting of sepsis  Hyperlipidemia -Continue statin  Morbid obesity with BMI greater than 40 -Patient will benefit from weight loss  DVT prophylaxis: Lovenox Code Status: full Family Communication: patient only Disposition Plan:  . Patient came from: home           . Anticipated d/c place: home . Barriers to d/c OR conditions which need to be met to effect a safe d/c: needs TEE done, PICC line inserted, IV antibiotics for likely 3-4 weeks at home minimum to be adjusted after TEE if needed.    Consultants:   Cardiology  Podiatry  ID  Procedures:   R 5th toe amputation Dr. Cannon Kettle 03/14/19  Antimicrobials:   Cefazolin start date 03/16/19 (total days 5 of antibiotic coverage) stop date earliest 04/09/19 (that would be 4 weeks)   Subjective: Patient feeling well overall, wants to go home today if at all possible. Getting TEE around noon. Denies new concerns or complaints.   Objective: Vitals:   03/16/19 1917 03/17/19 0354 03/17/19 0356 03/17/19 0952  BP: (!) 141/68 (!) 157/94  (!) 162/91  Pulse: 80 79  80  Resp: 19 19    Temp: 98.9 F (37.2 C) 98.9 F (37.2 C)    TempSrc: Oral Oral    SpO2: 96% 94%  96%  Weight:   (!) 148.2 kg   Height:        Intake/Output Summary (Last 24 hours) at 03/17/2019 1004 Last data filed at 03/17/2019 0500 Gross per 24  hour  Intake 3430 ml  Output 2200 ml  Net 1230 ml   Filed Weights   03/15/19 0434 03/16/19 0445 03/17/19 0356  Weight: (!) 145.9 kg (!) 145.9 kg (!) 148.2 kg    Examination:  General exam: Appears calm and comfortable, obese Respiratory system: Clear to auscultation. Respiratory effort normal. Cardiovascular system: S1 & S2 heard, RRR. No JVD, murmurs, rubs, gallops or clicks. No pedal edema. Gastrointestinal system: Abdomen  is nondistended, soft and nontender. No organomegaly or masses felt. Normal bowel sounds heard. Central nervous system: Alert and oriented. No focal neurological deficits. Extremities: Symmetric 5 x 5 power. Right foot not examined Skin: No rashes, lesions or ulcers Psychiatry: Judgement and insight appear normal. Mood & affect appropriate.   Data Reviewed: I have personally reviewed following labs and imaging studies  CBC: Recent Labs  Lab 03/12/19 2243 03/12/19 2243 03/13/19 7412 03/14/19 0451 03/15/19 0456 03/16/19 0222 03/17/19 0350  WBC 15.5*   < > 10.2 6.4 6.7 8.2 9.1  NEUTROABS 13.6*  --   --   --   --   --   --   HGB 14.5   < > 14.1 12.7* 11.5* 11.4* 10.8*  HCT 43.2   < > 42.3 38.2* 34.3* 35.0* 33.4*  MCV 86.2   < > 86.3 86.6 85.8 87.5 88.4  PLT 269   < > 246 202 165 188 235   < > = values in this interval not displayed.   Basic Metabolic Panel: Recent Labs  Lab 03/13/19 0652 03/14/19 0451 03/15/19 0456 03/16/19 0222 03/17/19 0350  NA 131* 134* 136 135 136  K 3.6 3.7 3.6 3.7 4.5  CL 96* 98 102 102 101  CO2 '24 26 25 24 27  ' GLUCOSE 320* 149* 195* 221* 249*  BUN 29* 21* '17 17 15  ' CREATININE 1.96* 1.66* 1.56* 1.43* 1.37*  CALCIUM 8.6* 8.2* 7.9* 7.7* 7.9*  MG 1.9 1.7 1.9 1.8 2.1   GFR: Estimated Creatinine Clearance: 89 mL/min (A) (by C-G formula based on SCr of 1.37 mg/dL (H)). Liver Function Tests: Recent Labs  Lab 03/13/19 0652 03/14/19 0451 03/15/19 0456 03/16/19 0222 03/17/19 0350  AST 32 71* 69* 51* 45*  ALT 25 39 55* 52* 48*  ALKPHOS 82 84 103 126 159*  BILITOT 1.3* 1.2 1.0 1.0 0.6  PROT 6.9 5.9* 5.4* 5.7* 5.6*  ALBUMIN 2.7* 2.2* 2.0* 2.0* 1.9*   No results for input(s): LIPASE, AMYLASE in the last 168 hours. No results for input(s): AMMONIA in the last 168 hours. Coagulation Profile: Recent Labs  Lab 03/13/19 0802  INR 1.0   Cardiac Enzymes: No results for input(s): CKTOTAL, CKMB, CKMBINDEX, TROPONINI in the last 168 hours. BNP (last 3  results) No results for input(s): PROBNP in the last 8760 hours. HbA1C: No results for input(s): HGBA1C in the last 72 hours. CBG: Recent Labs  Lab 03/16/19 0614 03/16/19 1106 03/16/19 1720 03/16/19 2056 03/17/19 0553  GLUCAP 193* 195* 248* 325* 223*   Lipid Profile: No results for input(s): CHOL, HDL, LDLCALC, TRIG, CHOLHDL, LDLDIRECT in the last 72 hours. Thyroid Function Tests: No results for input(s): TSH, T4TOTAL, FREET4, T3FREE, THYROIDAB in the last 72 hours. Anemia Panel: No results for input(s): VITAMINB12, FOLATE, FERRITIN, TIBC, IRON, RETICCTPCT in the last 72 hours. Sepsis Labs: Recent Labs  Lab 03/13/19 0011 03/13/19 0652  PROCALCITON  --  3.07  LATICACIDVEN 1.7  --     Recent Results (from the past 240 hour(s))  Blood culture (routine  x 2)     Status: Abnormal   Collection Time: 03/13/19 12:11 AM   Specimen: BLOOD  Result Value Ref Range Status   Specimen Description BLOOD LEFT FOREARM  Final   Special Requests   Final    BOTTLES DRAWN AEROBIC AND ANAEROBIC Blood Culture adequate volume   Culture  Setup Time   Final    GRAM POSITIVE COCCI IN CLUSTERS IN BOTH AEROBIC AND ANAEROBIC BOTTLES CRITICAL RESULT CALLED TO, READ BACK BY AND VERIFIED WITH: Hughie Closs [facility] 03/13/19 1829 JDW    Culture (A)  Final    STAPHYLOCOCCUS AUREUS SUSCEPTIBILITIES PERFORMED ON PREVIOUS CULTURE WITHIN THE LAST 5 DAYS. Performed at [facility], [place] [address] [place], [place] [address]    Report Status 03/15/2019 FINAL  Final  Respiratory Panel by RT PCR (Flu A&B, Covid) - Nasopharyngeal Swab     Status: None   Collection Time: 03/13/19 12:11 AM   Specimen: Nasopharyngeal Swab  Result Value Ref Range Status   SARS Coronavirus 2 by RT PCR NEGATIVE NEGATIVE Final    Comment: (NOTE) SARS-CoV-2 target nucleic acids are NOT DETECTED. The SARS-CoV-2 RNA is generally detectable in upper respiratoy specimens during the acute phase of infection. The lowest concentration of  SARS-CoV-2 viral copies this assay can detect is 131 copies/mL. A negative result does not preclude SARS-Cov-2 infection and should not be used as the sole basis for treatment or other patient management decisions. A negative result may occur with  improper specimen collection/handling, submission of specimen other than nasopharyngeal swab, presence of viral mutation(s) within the areas targeted by this assay, and inadequate number of viral copies (<131 copies/mL). A negative result must be combined with clinical observations, patient history, and epidemiological information. The expected result is Negative. Fact Sheet for Patients:  PinkCheek.be Fact Sheet for Healthcare Providers:  GravelBags.it This test is not yet ap proved or cleared by the [place] FDA and  has been authorized for detection and/or diagnosis of SARS-CoV-2 by FDA under an Emergency Use Authorization (EUA). This EUA will remain  in effect (meaning this test can be used) for the duration of the COVID-19 declaration under Section 564(b)(1) of the Act, 21 U.S.C. section 360bbb-3(b)(1), unless the authorization is terminated or revoked sooner.    Influenza A by PCR NEGATIVE NEGATIVE Final   Influenza B by PCR NEGATIVE NEGATIVE Final    Comment: (NOTE) The Xpert Xpress SARS-CoV-2/FLU/RSV assay is intended as an aid in  the diagnosis of influenza from Nasopharyngeal swab specimens and  should not be used as a sole basis for treatment. Nasal washings and  aspirates are unacceptable for Xpert Xpress SARS-CoV-2/FLU/RSV  testing. Fact Sheet for Patients: PinkCheek.be Fact Sheet for Healthcare Providers: GravelBags.it This test is not yet approved or cleared by the [place] FDA and  has been authorized for detection and/or diagnosis of SARS-CoV-2 by  FDA under an Emergency Use Authorization (EUA).  This EUA will remain  in effect (meaning this test can be used) for the duration of the  Covid-19 declaration under Section 564(b)(1) of the Act, 21  U.S.C. section 360bbb-3(b)(1), unless the authorization is  terminated or revoked. Performed at [facility], [place] [address] [place], [place] [address]   Blood culture (routine x 2)     Status: Abnormal   Collection Time: 03/13/19 12:36 AM   Specimen: BLOOD  Result Value Ref Range Status   Specimen Description BLOOD RIGHT HAND  Final   Special Requests  Final    BOTTLES DRAWN AEROBIC AND ANAEROBIC Blood Culture results may not be optimal due to an inadequate volume of blood received in culture bottles   Culture  Setup Time   Final    GRAM POSITIVE COCCI IN CLUSTERS ANAEROBIC BOTTLE ONLY CRITICAL RESULT CALLED TO, READ BACK BY AND VERIFIED WITH: Hughie Closs [facility] 03/13/19 1829 JDW Performed at [facility], [place] [address], [place], [place] [address]    Culture STAPHYLOCOCCUS AUREUS (A)  Final   Report Status 03/15/2019 FINAL  Final   Organism ID, Bacteria STAPHYLOCOCCUS AUREUS  Final      Susceptibility   Staphylococcus aureus - MIC*    CIPROFLOXACIN <=0.5 SENSITIVE Sensitive     ERYTHROMYCIN >=8 RESISTANT Resistant     GENTAMICIN <=0.5 SENSITIVE Sensitive     OXACILLIN <=0.25 SENSITIVE Sensitive     TETRACYCLINE >=16 RESISTANT Resistant     VANCOMYCIN 1 SENSITIVE Sensitive     TRIMETH/SULFA 80 RESISTANT Resistant     CLINDAMYCIN <=0.25 SENSITIVE Sensitive     RIFAMPIN <=0.5 SENSITIVE Sensitive     Inducible Clindamycin NEGATIVE Sensitive     * STAPHYLOCOCCUS AUREUS  Blood Culture ID Panel (Reflexed)     Status: Abnormal   Collection Time: 03/13/19 12:36 AM  Result Value Ref Range Status   Enterococcus species NOT DETECTED NOT DETECTED Final   Listeria monocytogenes NOT DETECTED NOT DETECTED Final   Staphylococcus species DETECTED (A) NOT DETECTED Final    Comment: CRITICAL RESULT CALLED TO, READ BACK BY AND VERIFIED  WITH: Hughie Closs [facility] 03/13/19 1829 JDW    Staphylococcus aureus (BCID) DETECTED (A) NOT DETECTED Final    Comment: Methicillin (oxacillin) susceptible Staphylococcus aureus (MSSA). Preferred therapy is anti staphylococcal beta lactam antibiotic (Cefazolin or Nafcillin), unless clinically contraindicated. CRITICAL RESULT CALLED TO, READ BACK BY AND VERIFIED WITH: Hughie Closs [facility] 03/13/19 1829 JDW    Methicillin resistance NOT DETECTED NOT DETECTED Final   Streptococcus species NOT DETECTED NOT DETECTED Final   Streptococcus agalactiae NOT DETECTED NOT DETECTED Final   Streptococcus pneumoniae NOT DETECTED NOT DETECTED Final   Streptococcus pyogenes NOT DETECTED NOT DETECTED Final   Acinetobacter baumannii NOT DETECTED NOT DETECTED Final   Enterobacteriaceae species NOT DETECTED NOT DETECTED Final   Enterobacter cloacae complex NOT DETECTED NOT DETECTED Final   Escherichia coli NOT DETECTED NOT DETECTED Final   Klebsiella oxytoca NOT DETECTED NOT DETECTED Final   Klebsiella pneumoniae NOT DETECTED NOT DETECTED Final   Proteus species NOT DETECTED NOT DETECTED Final   Serratia marcescens NOT DETECTED NOT DETECTED Final   Haemophilus influenzae NOT DETECTED NOT DETECTED Final   Neisseria meningitidis NOT DETECTED NOT DETECTED Final   Pseudomonas aeruginosa NOT DETECTED NOT DETECTED Final   Candida albicans NOT DETECTED NOT DETECTED Final   Candida glabrata NOT DETECTED NOT DETECTED Final   Candida krusei NOT DETECTED NOT DETECTED Final   Candida parapsilosis NOT DETECTED NOT DETECTED Final   Candida tropicalis NOT DETECTED NOT DETECTED Final    Comment: Performed at [facility], [place] [address], [place], [place] [address]  Urine culture     Status: None   Collection Time: 03/13/19  3:52 AM   Specimen: Urine, Clean Catch  Result Value Ref Range Status   Specimen Description URINE, CLEAN CATCH  Final   Special Requests NONE  Final   Culture   Final    NO GROWTH Performed at [facility], [address],  [place] [address]    Report Status 03/13/2019 FINAL  Final  Surgical pcr screen     Status: None   Collection Time: 03/13/19 10:31 PM   Specimen: Nasal Mucosa; Nasal Swab  Result Value Ref Range Status   MRSA, PCR NEGATIVE NEGATIVE Final   Staphylococcus aureus NEGATIVE NEGATIVE Final    Comment: (NOTE) The Xpert SA Assay (FDA approved for NASAL specimens in patients 92 years of age and older), is one component of a comprehensive surveillance program. It is not intended to diagnose infection nor to guide or monitor treatment. Performed at [facility], [place] [address], [place], [place] [address]   Aerobic/Anaerobic Culture (surgical/deep wound)     Status: None (Preliminary result)   Collection Time: 03/14/19  9:16 AM   Specimen: PATH Other; Tissue  Result Value Ref Range Status   Specimen Description WOUND FIFTH TOE ULCER  Final   Special Requests NONE  Final   Gram Stain   Final    RARE WBC PRESENT,BOTH PMN AND MONONUCLEAR NO ORGANISMS SEEN Performed at [facility], [address], [place], [place] [address]    Culture   Final    FEW ESCHERICHIA COLI FEW STAPHYLOCOCCUS AUREUS FEW STREPTOCOCCUS ANGINOSIS SUSCEPTIBILITIES TO FOLLOW NO ANAEROBES ISOLATED; CULTURE IN PROGRESS FOR 5 DAYS    Report Status PENDING  Incomplete   Organism ID, Bacteria ESCHERICHIA COLI  Final      Susceptibility   Escherichia coli - MIC*    AMPICILLIN >=32 RESISTANT Resistant     CEFAZOLIN <=4 SENSITIVE Sensitive     CEFEPIME <=0.12 SENSITIVE Sensitive     CEFTAZIDIME <=1 SENSITIVE Sensitive     CEFTRIAXONE <=0.25 SENSITIVE Sensitive     CIPROFLOXACIN <=0.25 SENSITIVE Sensitive     GENTAMICIN <=1 SENSITIVE Sensitive     IMIPENEM <=0.25 SENSITIVE Sensitive     TRIMETH/SULFA >=320 RESISTANT Resistant     AMPICILLIN/SULBACTAM 16 INTERMEDIATE Intermediate     PIP/TAZO <=4 SENSITIVE Sensitive     * FEW ESCHERICHIA COLI  Culture, blood (Routine X  2) w Reflex to ID Panel     Status: None (Preliminary result)   Collection Time: 03/15/19 11:24 AM   Specimen: BLOOD  Result Value Ref Range Status   Specimen Description BLOOD RIGHT ANTECUBITAL  Final   Special Requests   Final    BOTTLES DRAWN AEROBIC AND ANAEROBIC Blood Culture adequate volume Performed at [facility], [address] [place], [place] [address]    Culture NO GROWTH 2 DAYS  Final   Report Status PENDING  Incomplete  Culture, blood (Routine X 2) w Reflex to ID Panel     Status: None (Preliminary result)   Collection Time: 03/15/19 11:40 AM   Specimen: BLOOD LEFT HAND  Result Value Ref Range Status   Specimen Description BLOOD LEFT HAND  Final   Special Requests   Final    BOTTLES DRAWN AEROBIC AND ANAEROBIC Blood Culture adequate volume Performed at [facility], [place] [address], [place], [place] [address]    Culture NO GROWTH 2 DAYS  Final   Report Status PENDING  Incomplete    Radiology Studies: [place] EKG SITE RITE  Result Date: 03/17/2019 If Site Rite image not attached, placement could not be confirmed due to current cardiac rhythm.  Scheduled Meds: . aspirin EC  81 mg Oral Daily  . cholecalciferol  1,000 Units Oral Daily  . docusate sodium  100 mg Oral BID  . enoxaparin (  LOVENOX) injection  40 mg Subcutaneous Daily  . insulin aspart  0-20 Units Subcutaneous TID AC & HS  . montelukast  10 mg Oral QHS  . pantoprazole  40 mg Oral BID  . simvastatin  40 mg Oral QPM  . sodium chloride flush  3 mL Intravenous Q12H  . vitamin B-12  1,000 mcg Oral Daily   Continuous Infusions: . sodium chloride 125 mL/hr at 03/16/19 0517  . sodium chloride 1,000 mL (03/15/19 0634)  . sodium chloride    .  ceFAZolin (ANCEF) IV 2 g (03/17/19 0409)  . lactated ringers 10 mL/hr at 03/14/19 0258    LOS: 4 days   Time spent: 25  Hoyt Koch, MD [facility]  To contact the attending provider between 7A-7P or the covering provider during after hours  7P-7A, please log into the web site [email or web address] and access using universal [place] password for that web site. If you do not have the password, please call the hospital operator.  03/17/2019, 10:04 AM

## 2019-03-17 NOTE — TOC Transition Note (Signed)
Transition of Care [facility]) - CM/SW Discharge Note   Patient Details  Name: LETCHER SCHWEIKERT MRN: [id number] Date of Birth: 05-Aug-1960  Transition of [facility]) CM/SW Contact:  Leone Haven, RN Phone Number: 03/17/2019, 1:28 PM   Clinical Narrative:    NCM spoke with patient  He has no preference,  he is ok with [facility]   For iv abx.  NCM informed Jeri Modena of this information.  [facility] will assist with the infusion, will need HHRN order.    Final next level of care: Home w Home Health Services Barriers to Discharge: No Barriers Identified   Patient Goals and CMS Choice   CMS Medicare.gov Compare Post Acute Care list provided to:: Patient Choice offered to / list presented to : Patient  Discharge Placement                       Discharge Plan and Services                DME Arranged: (NA)         HH Arranged: RN, IV Antibiotics HH Agency: ([facility]) Date HH Agency Contacted: 03/17/19 Time HH Agency Contacted: 1327 Representative spoke with at [facility] Agency: Jeri Modena  Social Determinants of Health (SDOH) Interventions     Readmission Risk Interventions No flowsheet data found.

## 2019-03-17 NOTE — Progress Notes (Signed)
Inpatient Diabetes Program Recommendations  AACE/ADA: New Consensus Statement on Inpatient Glycemic Control   Target Ranges:  Prepandial:   less than 140 mg/dL      Peak postprandial:   less than 180 mg/dL (1-2 hours)      Critically ill patients:  140 - 180 mg/dL  Results for [name], [name] "[place]" (MRN [id number]) as of 03/17/2019 12:49  Ref. Range 03/16/2019 06:14 03/16/2019 11:06 03/16/2019 17:20 03/16/2019 20:56 03/17/2019 05:53 03/17/2019 10:59  Glucose-Capillary Latest Ref Range: 70 - 99 mg/dL 308 (H) 569 (H) 437 (H) 325 (H) 223 (H) 209 (H)    Review of Glycemic Control  Diabetes history: DM2 Outpatient Diabetes medications: Amaryl 4 mg QAM, 70/30 40-50 units BID, Metformin XR 1000 mg BID Current orders for Inpatient glycemic control:Novolog 0-20 units AC&HS  Inpatient Diabetes Program Recommendations:    Insulin-Basal: Please consider ordering Lantus 15 units Q24H (based on 148 kg x 0.1 units).  Thanks, Orlando Penner, RN, MSN, CDE Diabetes Coordinator Inpatient Diabetes Program [phone] (Team Pager from 8am to 5pm)

## 2019-03-18 ENCOUNTER — Ambulatory Visit (INDEPENDENT_AMBULATORY_CARE_PROVIDER_SITE_OTHER): Payer: BC Managed Care – PPO | Admitting: Sports Medicine

## 2019-03-18 ENCOUNTER — Other Ambulatory Visit: Payer: Self-pay | Admitting: Sports Medicine

## 2019-03-18 ENCOUNTER — Other Ambulatory Visit: Payer: Self-pay

## 2019-03-18 DIAGNOSIS — E1140 Type 2 diabetes mellitus with diabetic neuropathy, unspecified: Secondary | ICD-10-CM

## 2019-03-18 DIAGNOSIS — L03119 Cellulitis of unspecified part of limb: Secondary | ICD-10-CM

## 2019-03-18 DIAGNOSIS — L02619 Cutaneous abscess of unspecified foot: Secondary | ICD-10-CM

## 2019-03-18 DIAGNOSIS — Z9889 Other specified postprocedural states: Secondary | ICD-10-CM

## 2019-03-18 DIAGNOSIS — M86171 Other acute osteomyelitis, right ankle and foot: Secondary | ICD-10-CM

## 2019-03-18 LAB — SURGICAL PATHOLOGY

## 2019-03-18 NOTE — Progress Notes (Signed)
Subjective, 59 year old diabetic male patient seen in office for follow-up evaluation patient is status post discharge from hospital and underwent right fifth toe and distal metatarsal amputation for acute osteomyelitis on 03/14/2019.  Patient reports that he is glad to be home feeling a little better and has had 2 doses of antibiotics through his PICC line that he is administered himself since being at home.  Patient denies any nausea vomiting fever chills or any other constitutional symptoms at this time but still admits to some weakness.3  Objective General: No acute distress     Right Lower extremity: Dressing to left foot clean, dry, intact. No strikethrough noted, Upon removal of dressings sutures intact with no dehiscence, packing present at distal wound site. Decreased erythema, decreased edema, mild blood drainage with maceration. No other acute signs of infection. No calf pain. Range of motion excluding surgical site within normal limits with no pain or crepitation.      Problem List Items Addressed This Visit      Endocrine   Diabetes mellitus (HCC)     Other   S/P foot surgery, right - Primary    Other Visit Diagnoses    Acute osteomyelitis of right ankle or foot (HCC)       Cellulitis and abscess of foot, except toes           -Dressing change performed; applied plain packing to the right foot covered with dry dressing  -Advised patient to make sure to keep dressing clean, dry, and intact to right foot allowing [place] to change in office or home nurse also add on dressing care when she comes to check him for his PICC line antibiotic compliance  -Continue with PICC line antibiotics with recommendations from infectious disease  -Weightbearing to heel with post op shoe for bedside and bathroom only with assistive device/crutches   -Continue with rest and elevation to assist with pain and edema control   -Return to office within 1 week for postop x-rays and continued wound  care

## 2019-03-19 DIAGNOSIS — A4901 Methicillin susceptible Staphylococcus aureus infection, unspecified site: Secondary | ICD-10-CM | POA: Diagnosis not present

## 2019-03-19 DIAGNOSIS — E1110 Type 2 diabetes mellitus with ketoacidosis without coma: Secondary | ICD-10-CM | POA: Diagnosis not present

## 2019-03-19 LAB — AEROBIC/ANAEROBIC CULTURE W GRAM STAIN (SURGICAL/DEEP WOUND)

## 2019-03-20 LAB — CULTURE, BLOOD (ROUTINE X 2)
Culture: NO GROWTH
Culture: NO GROWTH
Special Requests: ADEQUATE
Special Requests: ADEQUATE

## 2019-03-22 LAB — CULTURE, BLOOD (ROUTINE X 2)
Culture: NO GROWTH
Culture: NO GROWTH

## 2019-03-23 ENCOUNTER — Other Ambulatory Visit (HOSPITAL_COMMUNITY)
Admission: RE | Admit: 2019-03-23 | Discharge: 2019-03-23 | Disposition: A | Discharge status: Home / Self Care | Payer: BC Managed Care – PPO | Source: Ambulatory Visit | Attending: Internal Medicine | Admitting: Internal Medicine

## 2019-03-23 DIAGNOSIS — E1110 Type 2 diabetes mellitus with ketoacidosis without coma: Secondary | ICD-10-CM | POA: Diagnosis not present

## 2019-03-23 DIAGNOSIS — S91302A Unspecified open wound, left foot, initial encounter: Secondary | ICD-10-CM | POA: Diagnosis not present

## 2019-03-23 DIAGNOSIS — A4901 Methicillin susceptible Staphylococcus aureus infection, unspecified site: Secondary | ICD-10-CM | POA: Diagnosis not present

## 2019-03-23 LAB — BASIC METABOLIC PANEL
Anion gap: 10 (ref 5–15)
BUN: 23 mg/dL — ABNORMAL HIGH (ref 6–20)
CO2: 26 mmol/L (ref 22–32)
Calcium: 8.9 mg/dL (ref 8.9–10.3)
Chloride: 103 mmol/L (ref 98–111)
Creatinine, Ser: 1.25 mg/dL — ABNORMAL HIGH (ref 0.61–1.24)
GFR calc Af Amer: >60 mL/min (ref >60)
GFR calc non Af Amer: >60 mL/min (ref >60)
Glucose, Bld: 84 mg/dL (ref 70–99)
Potassium: 4.2 mmol/L (ref 3.5–5.1)
Sodium: 139 mmol/L (ref 135–145)

## 2019-03-23 LAB — CBC WITH DIFFERENTIAL/PLATELET
Abs Immature Granulocytes: 0.07 10*3/uL (ref 0.00–0.07)
Basophils Absolute: 0.1 10*3/uL (ref 0.0–0.1)
Basophils Relative: 1 %
Eosinophils Absolute: 0.4 10*3/uL (ref 0.0–0.5)
Eosinophils Relative: 3 %
HCT: 37.6 % — ABNORMAL LOW (ref 39.0–52.0)
Hemoglobin: 12 g/dL — ABNORMAL LOW (ref 13.0–17.0)
Immature Granulocytes: 1 %
Lymphocytes Relative: 14 %
Lymphs Abs: 1.8 10*3/uL (ref 0.7–4.0)
MCH: 29 pg (ref 26.0–34.0)
MCHC: 31.9 g/dL (ref 30.0–36.0)
MCV: 90.8 fL (ref 80.0–100.0)
Monocytes Absolute: 0.8 10*3/uL (ref 0.1–1.0)
Monocytes Relative: 7 %
Neutro Abs: 9.4 10*3/uL — ABNORMAL HIGH (ref 1.7–7.7)
Neutrophils Relative %: 74 %
Platelets: 469 10*3/uL — ABNORMAL HIGH (ref 150–400)
RBC: 4.14 MIL/uL — ABNORMAL LOW (ref 4.22–5.81)
RDW: 13.8 % (ref 11.5–15.5)
WBC: 12.5 10*3/uL — ABNORMAL HIGH (ref 4.0–10.5)
nRBC: 0 % (ref 0.0–0.2)

## 2019-03-24 DIAGNOSIS — E11621 Type 2 diabetes mellitus with foot ulcer: Secondary | ICD-10-CM | POA: Diagnosis not present

## 2019-03-24 DIAGNOSIS — Z23 Encounter for immunization: Secondary | ICD-10-CM | POA: Diagnosis not present

## 2019-03-24 DIAGNOSIS — J4520 Mild intermittent asthma, uncomplicated: Secondary | ICD-10-CM | POA: Diagnosis not present

## 2019-03-24 DIAGNOSIS — E1140 Type 2 diabetes mellitus with diabetic neuropathy, unspecified: Secondary | ICD-10-CM | POA: Diagnosis not present

## 2019-03-24 DIAGNOSIS — G4733 Obstructive sleep apnea (adult) (pediatric): Secondary | ICD-10-CM | POA: Diagnosis not present

## 2019-03-25 ENCOUNTER — Other Ambulatory Visit: Payer: Self-pay | Admitting: Sports Medicine

## 2019-03-25 ENCOUNTER — Ambulatory Visit (INDEPENDENT_AMBULATORY_CARE_PROVIDER_SITE_OTHER): Payer: BC Managed Care – PPO

## 2019-03-25 ENCOUNTER — Encounter: Payer: Self-pay | Admitting: Sports Medicine

## 2019-03-25 ENCOUNTER — Other Ambulatory Visit: Payer: Self-pay

## 2019-03-25 ENCOUNTER — Ambulatory Visit (INDEPENDENT_AMBULATORY_CARE_PROVIDER_SITE_OTHER): Payer: BC Managed Care – PPO | Admitting: Sports Medicine

## 2019-03-25 DIAGNOSIS — Z9889 Other specified postprocedural states: Secondary | ICD-10-CM

## 2019-03-25 DIAGNOSIS — L03119 Cellulitis of unspecified part of limb: Secondary | ICD-10-CM

## 2019-03-25 DIAGNOSIS — M86171 Other acute osteomyelitis, right ankle and foot: Secondary | ICD-10-CM

## 2019-03-25 DIAGNOSIS — L02619 Cutaneous abscess of unspecified foot: Secondary | ICD-10-CM

## 2019-03-25 DIAGNOSIS — E1140 Type 2 diabetes mellitus with diabetic neuropathy, unspecified: Secondary | ICD-10-CM

## 2019-03-25 NOTE — Progress Notes (Signed)
Subjective: 59 year old diabetic male patient seen in office for follow-up evaluation patient is status post right fifth toe and distal metatarsal amputation for acute osteomyelitis on 03/14/2019.  Patient reports that seems to be doing fine without any issues has been resting elevating using the surgical shoe and administering PICC line antibiotics with home nursing reports that there is a little bit of nausea and diarrhea but seems to be under control with antinausea medicine and probiotics denies fever chills or any other constitutional symptoms at this time.   Patient Active Problem List   Diagnosis Date Noted  . Cellulitis of right foot   . S/P foot surgery, right   . MSSA bacteremia   . Acute sepsis (HCC) 03/13/2019  . Osteomyelitis of fifth toe of right foot (HCC) 03/13/2019  . DKA (diabetic ketoacidosis) (HCC) 03/13/2019  . Acute osteomyelitis of toe of right foot (HCC)   . AKI (acute kidney injury) (HCC)   . Demand ischemia (HCC)   . Hyperglycemia   . Laryngopharyngeal reflux (LPR) 02/07/2016  . GERD (gastroesophageal reflux disease) 12/27/2015  . Arthritis 11/16/2015  . Diabetes mellitus (HCC) 11/16/2015  . Hyperlipidemia 11/16/2015  . Localized edema 11/16/2015  . OBESITY 05/15/2007  . ADD 05/15/2007  . Obesity, diabetes, and hypertension syndrome (HCC) 05/15/2007  . Allergic rhinitis 05/15/2007  . Obstructive sleep apnea 05/15/2007  . COUGH 05/15/2007    Objective General: No acute distress  Right Lower extremity: At amputation wound site, packing present at distal wound site and measures less than 1 cm. Decreased erythema, decreased edema, mild bloody drainage with decreased maceration. No other acute signs of infection. No calf pain. Range of motion excluding surgical site within normal limits with no pain or crepitation.  X-rays consistent with postoperative status fifth ray amputation down to the level of the base.    Problem List Items Addressed This Visit      Endocrine   Diabetes mellitus (HCC)     Other   S/P foot surgery, right - Primary    Other Visit Diagnoses    Acute osteomyelitis of right ankle or foot (HCC)       Cellulitis and abscess of foot, except toes          -Patient seen and evaluated -X-rays reviewed -Dressing change performed; applied plain packing to the right foot covered with dry dressing -Advised patient to make sure to keep dressing clean, dry, and intact to right foot allowing nurse or family member to change dressing as instructed -Discussed plan of care with [facility]; care coordinator informed me that they do not do wound care and if patient for family member cannot change dressing will have to transition care to another agency that can do both -Continue with PICC line antibiotics with recommendations from infectious disease -Weightbearing to heel with post op shoe  -Continue with rest and elevation to assist with pain and edema control  -Return to office within 1 week for postop wound care.  Advised patient that sutures will stay in at least for another 3 weeks.

## 2019-03-26 DIAGNOSIS — A4901 Methicillin susceptible Staphylococcus aureus infection, unspecified site: Secondary | ICD-10-CM | POA: Diagnosis not present

## 2019-03-26 DIAGNOSIS — E1110 Type 2 diabetes mellitus with ketoacidosis without coma: Secondary | ICD-10-CM | POA: Diagnosis not present

## 2019-03-27 ENCOUNTER — Telehealth: Payer: Self-pay | Admitting: *Deleted

## 2019-03-27 NOTE — Telephone Encounter (Signed)
See message re: [facility]. Since they do not cover where the patient is living please check to see if [facility] or [facility] covers this area

## 2019-03-27 NOTE — Telephone Encounter (Signed)
[facility] wanted to inform that they do not service that area for the IV antibiotic since patient is living with his girlfriend. Any further questions, please call [phone].

## 2019-03-27 NOTE — Telephone Encounter (Signed)
Called [facility] and spoke with Enrique Sack and I sent the orders over by fax today and the fax number is [phone]. Misty Stanley

## 2019-03-27 NOTE — Telephone Encounter (Signed)
-----   Message from Titorya [name], DPM sent at 03/26/2019  4:49 PM EST ----- Regarding: FW: Home Nursing Please let patient know once we have found a new agency that can do wound and picc line care we will call him and have [facility] to stop coming. See message below Thanks Dr. [name] ----- Message ----- From: Cribbs, Ashley F, NT Sent: 03/26/2019   4:28 PM EST To: Titorya [name], DPM Subject: RE: Home Nursing                               [name] called and said he was good with changing to another agency. He is staying with his girlfriend and gave me her address. He also said that the nurse with [facility] will be coming on Sunday. Didn't know how long it would take to change agencies. He said just for us to give him a call and let him know what was going on.  Girlfriends address:  [address] [place], [place] [address]   ----- Message ----- From: [name], Titorya, DPM Sent: 03/25/2019  11:54 AM EST To: Ashley F Cribbs, NT Subject: Home Nursing                                   I spoke with Ashley at [facility] and she informed me that THEY DO NOT DO WOUND CARE. Please call [name] to let him know this and if his sister or girlfriend can't help with changing the dressing once or twice a week then we will have to see if we switch him to another agency that can care for his PICC line and do the wound care. Thanks Dr. [name]    

## 2019-03-29 ENCOUNTER — Other Ambulatory Visit (HOSPITAL_COMMUNITY)
Admission: RE | Admit: 2019-03-29 | Discharge: 2019-03-29 | Disposition: A | Discharge status: Home / Self Care | Payer: BC Managed Care – PPO | Source: Skilled Nursing Facility | Attending: Internal Medicine | Admitting: Internal Medicine

## 2019-03-29 DIAGNOSIS — A4901 Methicillin susceptible Staphylococcus aureus infection, unspecified site: Secondary | ICD-10-CM | POA: Diagnosis not present

## 2019-03-29 DIAGNOSIS — S91302A Unspecified open wound, left foot, initial encounter: Secondary | ICD-10-CM | POA: Insufficient documentation

## 2019-03-29 DIAGNOSIS — E1110 Type 2 diabetes mellitus with ketoacidosis without coma: Secondary | ICD-10-CM | POA: Diagnosis not present

## 2019-03-29 LAB — CBC WITH DIFFERENTIAL/PLATELET
Abs Immature Granulocytes: 0.02 10*3/uL (ref 0.00–0.07)
Basophils Absolute: 0.1 10*3/uL (ref 0.0–0.1)
Basophils Relative: 1 %
Eosinophils Absolute: 0.2 10*3/uL (ref 0.0–0.5)
Eosinophils Relative: 2 %
HCT: 40.4 % (ref 39.0–52.0)
Hemoglobin: 12.8 g/dL — ABNORMAL LOW (ref 13.0–17.0)
Immature Granulocytes: 0 %
Lymphocytes Relative: 24 %
Lymphs Abs: 2.1 10*3/uL (ref 0.7–4.0)
MCH: 28.9 pg (ref 26.0–34.0)
MCHC: 31.7 g/dL (ref 30.0–36.0)
MCV: 91.2 fL (ref 80.0–100.0)
Monocytes Absolute: 0.7 10*3/uL (ref 0.1–1.0)
Monocytes Relative: 8 %
Neutro Abs: 5.7 10*3/uL (ref 1.7–7.7)
Neutrophils Relative %: 65 %
Platelets: 469 10*3/uL — ABNORMAL HIGH (ref 150–400)
RBC: 4.43 MIL/uL (ref 4.22–5.81)
RDW: 14.3 % (ref 11.5–15.5)
WBC: 8.8 10*3/uL (ref 4.0–10.5)
nRBC: 0 % (ref 0.0–0.2)

## 2019-03-29 LAB — C-REACTIVE PROTEIN: CRP: 0.8 mg/dL (ref <1.0)

## 2019-03-30 ENCOUNTER — Telehealth: Payer: Self-pay

## 2019-03-30 ENCOUNTER — Telehealth: Payer: Self-pay | Admitting: *Deleted

## 2019-03-30 LAB — BASIC METABOLIC PANEL
Anion gap: 11 (ref 5–15)
BUN: 15 mg/dL (ref 6–20)
CO2: 25 mmol/L (ref 22–32)
Calcium: 8.9 mg/dL (ref 8.9–10.3)
Chloride: 101 mmol/L (ref 98–111)
Creatinine, Ser: 1.22 mg/dL (ref 0.61–1.24)
GFR calc Af Amer: >60 mL/min (ref >60)
GFR calc non Af Amer: >60 mL/min (ref >60)
Glucose, Bld: 32 mg/dL — CL (ref 70–99)
Potassium: 4.2 mmol/L (ref 3.5–5.1)
Sodium: 137 mmol/L (ref 135–145)

## 2019-03-30 NOTE — Telephone Encounter (Signed)
These should be going to ID not to me.

## 2019-03-30 NOTE — Telephone Encounter (Signed)
Called [facility] and they stated that they can go to [place] area and I faxed over the necessary paper work to [phone] today. Misty Stanley

## 2019-03-30 NOTE — Telephone Encounter (Signed)
CRITICAL GLUCOSE 32 : lab taken 03/29/19 per Morrie Sheldon at [facility] [phone]. Morrie Sheldon also reported lab result to pharmacy.  Lab drawn for ANCEF IVPB ordered 03/17/19 by Dr. Okey Dupre.

## 2019-04-01 ENCOUNTER — Encounter: Payer: BC Managed Care – PPO | Admitting: Sports Medicine

## 2019-04-01 ENCOUNTER — Telehealth: Payer: Self-pay | Admitting: *Deleted

## 2019-04-01 ENCOUNTER — Ambulatory Visit (INDEPENDENT_AMBULATORY_CARE_PROVIDER_SITE_OTHER): Payer: BC Managed Care – PPO | Admitting: Sports Medicine

## 2019-04-01 ENCOUNTER — Encounter: Payer: Self-pay | Admitting: Sports Medicine

## 2019-04-01 ENCOUNTER — Other Ambulatory Visit: Payer: Self-pay

## 2019-04-01 DIAGNOSIS — L02619 Cutaneous abscess of unspecified foot: Secondary | ICD-10-CM

## 2019-04-01 DIAGNOSIS — E1140 Type 2 diabetes mellitus with diabetic neuropathy, unspecified: Secondary | ICD-10-CM

## 2019-04-01 DIAGNOSIS — L03119 Cellulitis of unspecified part of limb: Secondary | ICD-10-CM

## 2019-04-01 DIAGNOSIS — Z9889 Other specified postprocedural states: Secondary | ICD-10-CM

## 2019-04-01 DIAGNOSIS — L97511 Non-pressure chronic ulcer of other part of right foot limited to breakdown of skin: Secondary | ICD-10-CM

## 2019-04-01 DIAGNOSIS — M86171 Other acute osteomyelitis, right ankle and foot: Secondary | ICD-10-CM

## 2019-04-01 MED ORDER — GABAPENTIN 300 MG PO CAPS: 300.0000 mg | ORAL_CAPSULE | Freq: Every day | ORAL | 3 refills | Status: DC

## 2019-04-01 NOTE — Telephone Encounter (Signed)
Called the patient back and made patient aware that Anast[facility] Fiedler will still be coming out on sundays and patient can see Dr Marylene Land on Wednesday and to call the office if any concerns or questions at [phone]. Misty Stanley

## 2019-04-01 NOTE — Telephone Encounter (Signed)
-----   Message from [name], [place] sent at 04/01/2019  9:06 AM EST ----- Regarding: Call re Home Care I told patient that [facility] does not go to [place] but he wants you to call him to discuss it as well. I told him that we will continue with [facility] with picc line and the nurse out of courtesy can keep changing it on Sundays or he or his girlfriend can do it

## 2019-04-01 NOTE — Progress Notes (Signed)
Subjective: 59 year old diabetic male patient seen in office for follow-up evaluation patient is status post right fifth toe and distal metatarsal amputation for acute osteomyelitis on 03/14/2019.  Patient reports that home nurse has been helping to change the dressings on Sunday out of courtesy and has noticed he has had issues with a sharp pain that lasts a few seconds at the plantar aspect of the right foot denies nausea vomiting fever chills or any constitutional symptoms reports that his blood sugar this morning was 80 and denies any issues with his PICC line antibiotics.   Patient Active Problem List   Diagnosis Date Noted  . Cellulitis of right foot   . S/P foot surgery, right   . MSSA bacteremia   . Acute sepsis (HCC) 03/13/2019  . Osteomyelitis of fifth toe of right foot (HCC) 03/13/2019  . DKA (diabetic ketoacidosis) (HCC) 03/13/2019  . Acute osteomyelitis of toe of right foot (HCC)   . AKI (acute kidney injury) (HCC)   . Demand ischemia (HCC)   . Hyperglycemia   . Laryngopharyngeal reflux (LPR) 02/07/2016  . GERD (gastroesophageal reflux disease) 12/27/2015  . Arthritis 11/16/2015  . Diabetes mellitus (HCC) 11/16/2015  . Hyperlipidemia 11/16/2015  . Localized edema 11/16/2015  . OBESITY 05/15/2007  . ADD 05/15/2007  . Obesity, diabetes, and hypertension syndrome (HCC) 05/15/2007  . Allergic rhinitis 05/15/2007  . Obstructive sleep apnea 05/15/2007  . COUGH 05/15/2007    Objective General: No acute distress  Right Lower extremity: At amputation wound site, packing present at distal wound site and measures less than 1 cm like before with a granular base. Decreased erythema, decreased edema, mild bloody drainage with decreased maceration. No other acute signs of infection. No calf pain. Range of motion excluding surgical site within normal limits with no pain or crepitation. S/p 5th partial ray amputation.    Problem List Items Addressed This Visit      Endocrine   Diabetes mellitus (HCC)     Other   S/P foot surgery, right - Primary    Other Visit Diagnoses    Acute osteomyelitis of right ankle or foot (HCC)       Cellulitis and abscess of foot, except toes       Foot ulcer, right, limited to breakdown of skin (HCC)           -Patient seen and evaluat -Dressing change performed; applied plain packing to the right foot covered with dry dressing -Advised patient to make sure to keep dressing clean, dry, and intact to right foot allowing nurse or family member to change dressing as instructed -Continue with PICC line antibiotics with recommendations from infectious disease with follow up as scheduled -Weightbearing to heel with post op shoe  -Continue with rest and elevation to assist with pain and edema control  -Rx Gabapentin  -Return to office within 1 week for postop wound care.  Advised patient that sutures will stay in at least for another 3 weeks.

## 2019-04-02 DIAGNOSIS — A4901 Methicillin susceptible Staphylococcus aureus infection, unspecified site: Secondary | ICD-10-CM | POA: Diagnosis not present

## 2019-04-02 DIAGNOSIS — E1110 Type 2 diabetes mellitus with ketoacidosis without coma: Secondary | ICD-10-CM | POA: Diagnosis not present

## 2019-04-05 DIAGNOSIS — E1110 Type 2 diabetes mellitus with ketoacidosis without coma: Secondary | ICD-10-CM | POA: Diagnosis not present

## 2019-04-05 DIAGNOSIS — A4901 Methicillin susceptible Staphylococcus aureus infection, unspecified site: Secondary | ICD-10-CM | POA: Diagnosis not present

## 2019-04-08 ENCOUNTER — Ambulatory Visit (INDEPENDENT_AMBULATORY_CARE_PROVIDER_SITE_OTHER): Payer: BC Managed Care – PPO | Admitting: Sports Medicine

## 2019-04-08 ENCOUNTER — Telehealth: Payer: Self-pay | Admitting: *Deleted

## 2019-04-08 ENCOUNTER — Other Ambulatory Visit: Payer: Self-pay

## 2019-04-08 ENCOUNTER — Encounter: Payer: Self-pay | Admitting: Sports Medicine

## 2019-04-08 DIAGNOSIS — M86171 Other acute osteomyelitis, right ankle and foot: Secondary | ICD-10-CM

## 2019-04-08 DIAGNOSIS — E1140 Type 2 diabetes mellitus with diabetic neuropathy, unspecified: Secondary | ICD-10-CM

## 2019-04-08 DIAGNOSIS — Z9889 Other specified postprocedural states: Secondary | ICD-10-CM

## 2019-04-08 DIAGNOSIS — L03119 Cellulitis of unspecified part of limb: Secondary | ICD-10-CM

## 2019-04-08 DIAGNOSIS — L02619 Cutaneous abscess of unspecified foot: Secondary | ICD-10-CM

## 2019-04-08 NOTE — Telephone Encounter (Signed)
-----   Message from [name], [place] sent at 03/26/2019  4:49 PM EST ----- Regarding: FW: Home Nursing Please let patient know once we have found a new agency that can do wound and picc line care we will call him and have [facility] to stop coming. See message below Thanks Dr. Marylene Land ----- Message ----- From: Ernest Mallick, [place] Sent: 03/26/2019   4:28 PM EST To: [name], DPM Subject: RE: Home Nursing                               [place] called and said he was good with changing to another agency. He is staying with his girlfriend and gave me her address. He also said that the nurse with [facility] will be coming on Sunday. Didn't know how long it would take to change agencies. He said just for [place] to give him a call and let him know what was going on.  Girlfriends address:  [address] [place], [place] [address]   ----- Message ----- From: [name], DPM Sent: 03/25/2019  11:54 AM EST To: Ernest Mallick, NT Subject: Home Nursing                                   I spoke with Morrie Sheldon at [facility] and she informed me that THEY DO NOT DO WOUND CARE. Please call [place] to let him know this and if his sister or girlfriend can't help with changing the dressing once or twice a week then we will have to see if we switch him to another agency that can care for his PICC line and do the wound care. Thanks Dr. Marylene Land

## 2019-04-08 NOTE — Progress Notes (Signed)
Subjective: 59 year old diabetic male patient seen in office for follow-up evaluation patient is status post right fifth toe and distal metatarsal amputation for acute osteomyelitis on 03/14/2019.  Patient reports that home nurse has been helping to change the dressings on Sunday like before, patient denies nausea vomiting fever chills or any constitutional symptoms reports that his blood sugar this morning was 71 and denies any issues with his PICC line antibiotics to see ID next week.   Patient Active Problem List   Diagnosis Date Noted  . Cellulitis of right foot   . S/P foot surgery, right   . MSSA bacteremia   . Acute sepsis (HCC) 03/13/2019  . Osteomyelitis of fifth toe of right foot (HCC) 03/13/2019  . DKA (diabetic ketoacidosis) (HCC) 03/13/2019  . Acute osteomyelitis of toe of right foot (HCC)   . AKI (acute kidney injury) (HCC)   . Demand ischemia (HCC)   . Hyperglycemia   . Laryngopharyngeal reflux (LPR) 02/07/2016  . GERD (gastroesophageal reflux disease) 12/27/2015  . Arthritis 11/16/2015  . Diabetes mellitus (HCC) 11/16/2015  . Hyperlipidemia 11/16/2015  . Localized edema 11/16/2015  . OBESITY 05/15/2007  . ADD 05/15/2007  . Obesity, diabetes, and hypertension syndrome (HCC) 05/15/2007  . Allergic rhinitis 05/15/2007  . Obstructive sleep apnea 05/15/2007  . COUGH 05/15/2007    Objective General: No acute distress  Right Lower extremity: At amputation wound site, packing present at distal wound site and measures less than 0.7x0.8 cm with a granular base. Decreased erythema, decreased edema, mild bloody drainage with decreased maceration. No other acute signs of infection. No calf pain. Range of motion excluding surgical site within normal limits with no pain or crepitation. S/p 5th partial ray amputation.    Problem List Items Addressed This Visit      Endocrine   Diabetes mellitus (HCC)     Other   S/P foot surgery, right - Primary    Other Visit Diagnoses    Acute osteomyelitis of right ankle or foot (HCC)       Cellulitis and abscess of foot, except toes          -Patient seen and evaluat -Dressing change performed; applied plain packing to the right foot covered with dry dressing -Advised patient to make sure to keep dressing clean, dry, and intact to right foot allowing nurse or family member to change dressing as instructed -Continue with PICC line antibiotics with recommendations from infectious disease with follow up as scheduled on 3/4 -Weightbearing to heel with post op shoe  -Continue with rest and elevation to assist with pain and edema control  -Continue with Gabapentin  -Return to office within 1 week for postop wound care.  Advised patient that sutures will stay in at least for another 2 weeks.

## 2019-04-08 NOTE — Telephone Encounter (Signed)
-----   Message from [name], [place] sent at 03/26/2019  4:43 PM EST ----- Regarding: Home health care Patient desires to switch from [facility] to [facility] or [facility] since [facility] does not do wound care. Patient will need picc line care as well as wound care for his right foot which should be changed twice a week using packing to the amputation site wound on the right foot covered with dry dressing. Thanks Dr. Kathie Rhodes

## 2019-04-08 NOTE — Telephone Encounter (Signed)
Hadley Pen, CMA states she sent orders to [facility].

## 2019-04-08 NOTE — Telephone Encounter (Signed)
Hadley Pen, CMA states orders have been sent to [facility].

## 2019-04-09 DIAGNOSIS — E1110 Type 2 diabetes mellitus with ketoacidosis without coma: Secondary | ICD-10-CM | POA: Diagnosis not present

## 2019-04-09 DIAGNOSIS — A4901 Methicillin susceptible Staphylococcus aureus infection, unspecified site: Secondary | ICD-10-CM | POA: Diagnosis not present

## 2019-04-12 ENCOUNTER — Other Ambulatory Visit (HOSPITAL_COMMUNITY)
Admission: AD | Admit: 2019-04-12 | Discharge: 2019-04-12 | Disposition: A | Discharge status: Home / Self Care | Payer: BC Managed Care – PPO | Source: Other Acute Inpatient Hospital | Attending: Internal Medicine | Admitting: Internal Medicine

## 2019-04-12 DIAGNOSIS — S91302A Unspecified open wound, left foot, initial encounter: Secondary | ICD-10-CM | POA: Diagnosis not present

## 2019-04-12 DIAGNOSIS — A4901 Methicillin susceptible Staphylococcus aureus infection, unspecified site: Secondary | ICD-10-CM | POA: Diagnosis not present

## 2019-04-12 DIAGNOSIS — E1110 Type 2 diabetes mellitus with ketoacidosis without coma: Secondary | ICD-10-CM | POA: Diagnosis not present

## 2019-04-12 DIAGNOSIS — G4733 Obstructive sleep apnea (adult) (pediatric): Secondary | ICD-10-CM | POA: Diagnosis not present

## 2019-04-12 LAB — BASIC METABOLIC PANEL
Anion gap: 10 (ref 5–15)
BUN: 20 mg/dL (ref 6–20)
CO2: 28 mmol/L (ref 22–32)
Calcium: 9 mg/dL (ref 8.9–10.3)
Chloride: 98 mmol/L (ref 98–111)
Creatinine, Ser: 1.41 mg/dL — ABNORMAL HIGH (ref 0.61–1.24)
GFR calc Af Amer: >60 mL/min (ref >60)
GFR calc non Af Amer: 55 mL/min — ABNORMAL LOW (ref >60)
Glucose, Bld: 62 mg/dL — ABNORMAL LOW (ref 70–99)
Potassium: 4.5 mmol/L (ref 3.5–5.1)
Sodium: 136 mmol/L (ref 135–145)

## 2019-04-12 LAB — CBC WITH DIFFERENTIAL/PLATELET
Abs Immature Granulocytes: 0.02 10*3/uL (ref 0.00–0.07)
Basophils Absolute: 0.1 10*3/uL (ref 0.0–0.1)
Basophils Relative: 1 %
Eosinophils Absolute: 0.3 10*3/uL (ref 0.0–0.5)
Eosinophils Relative: 4 %
HCT: 42.2 % (ref 39.0–52.0)
Hemoglobin: 13.3 g/dL (ref 13.0–17.0)
Immature Granulocytes: 0 %
Lymphocytes Relative: 24 %
Lymphs Abs: 1.9 10*3/uL (ref 0.7–4.0)
MCH: 28.6 pg (ref 26.0–34.0)
MCHC: 31.5 g/dL (ref 30.0–36.0)
MCV: 90.8 fL (ref 80.0–100.0)
Monocytes Absolute: 0.8 10*3/uL (ref 0.1–1.0)
Monocytes Relative: 10 %
Neutro Abs: 4.8 10*3/uL (ref 1.7–7.7)
Neutrophils Relative %: 61 %
Platelets: 313 10*3/uL (ref 150–400)
RBC: 4.65 MIL/uL (ref 4.22–5.81)
RDW: 14.1 % (ref 11.5–15.5)
WBC: 7.8 10*3/uL (ref 4.0–10.5)
nRBC: 0 % (ref 0.0–0.2)

## 2019-04-15 ENCOUNTER — Ambulatory Visit (INDEPENDENT_AMBULATORY_CARE_PROVIDER_SITE_OTHER): Payer: BC Managed Care – PPO | Admitting: Sports Medicine

## 2019-04-15 ENCOUNTER — Encounter: Payer: Self-pay | Admitting: Infectious Disease

## 2019-04-15 ENCOUNTER — Ambulatory Visit: Payer: BC Managed Care – PPO | Admitting: Infectious Disease

## 2019-04-15 ENCOUNTER — Other Ambulatory Visit: Payer: Self-pay

## 2019-04-15 ENCOUNTER — Encounter: Payer: BC Managed Care – PPO | Admitting: Sports Medicine

## 2019-04-15 ENCOUNTER — Encounter: Payer: Self-pay | Admitting: Sports Medicine

## 2019-04-15 VITALS — BP 176/103 | HR 105 | Wt 307.0 lb

## 2019-04-15 DIAGNOSIS — Z9889 Other specified postprocedural states: Secondary | ICD-10-CM

## 2019-04-15 DIAGNOSIS — M869 Osteomyelitis, unspecified: Secondary | ICD-10-CM

## 2019-04-15 DIAGNOSIS — M86171 Other acute osteomyelitis, right ankle and foot: Secondary | ICD-10-CM

## 2019-04-15 DIAGNOSIS — L97512 Non-pressure chronic ulcer of other part of right foot with fat layer exposed: Secondary | ICD-10-CM

## 2019-04-15 DIAGNOSIS — E1169 Type 2 diabetes mellitus with other specified complication: Secondary | ICD-10-CM

## 2019-04-15 DIAGNOSIS — B9561 Methicillin susceptible Staphylococcus aureus infection as the cause of diseases classified elsewhere: Secondary | ICD-10-CM

## 2019-04-15 DIAGNOSIS — R7881 Bacteremia: Secondary | ICD-10-CM

## 2019-04-15 DIAGNOSIS — E669 Obesity, unspecified: Secondary | ICD-10-CM

## 2019-04-15 DIAGNOSIS — L03119 Cellulitis of unspecified part of limb: Secondary | ICD-10-CM

## 2019-04-15 DIAGNOSIS — L03115 Cellulitis of right lower limb: Secondary | ICD-10-CM | POA: Diagnosis not present

## 2019-04-15 DIAGNOSIS — E1142 Type 2 diabetes mellitus with diabetic polyneuropathy: Secondary | ICD-10-CM

## 2019-04-15 DIAGNOSIS — I10 Essential (primary) hypertension: Secondary | ICD-10-CM

## 2019-04-15 DIAGNOSIS — L02619 Cutaneous abscess of unspecified foot: Secondary | ICD-10-CM

## 2019-04-15 DIAGNOSIS — E1140 Type 2 diabetes mellitus with diabetic neuropathy, unspecified: Secondary | ICD-10-CM

## 2019-04-15 DIAGNOSIS — E1159 Type 2 diabetes mellitus with other circulatory complications: Secondary | ICD-10-CM

## 2019-04-15 MED ORDER — CEPHALEXIN 500 MG PO CAPS: 500.0000 mg | ORAL_CAPSULE | Freq: Four times a day (QID) | ORAL | 1 refills | Status: DC

## 2019-04-15 NOTE — Progress Notes (Signed)
Per verbal order from Dr Daiva Eves, 41 cm Single Lumen Peripherally Inserted Central Catheter removed from right basilic, tip intact. No sutures present. RN confirmed length per chart. Dressing was clean and dry, but PICC had 15 cm exposed. Petroleum dressing applied. Pt advised no heavy lifting with this arm, leave dressing for 24 hours and call the office or seek emergent care if dressing becomes soaked with blood or swelling or sharp pain presents. Patient verbalized understanding and agreement.  Patient's questions answered to their satisfaction. Patient tolerated procedure well, RN walked patient to check out. RN notified [facility] (Debbie) and Cassie Kuppelweiser. Andree Coss, RN

## 2019-04-15 NOTE — Progress Notes (Signed)
Subjective:  Chief complaint follow-up for diabetic foot infection osteomyelitis and Staph aureus bacteremia  Patient ID: [name], male    DOB: 1960/02/17, 59 y.o.   MRN: [id number]  HPI   59 y.o. male with diabetes and foot wound complicated by abscess/cellulitis and MSSA bacteremia. Underwent 5th toe I&D and distal metatarsal amputation. Noted to have purulence draining from the incision site and puss draining from plantar ulceration site. 5th metatarsal head was resected back to the level of midshaft.  Cultures from the operative site yielded MSSA but also an E. coli that was sensitive to cefazolin.  Transesophageal echocardiogram failed to show evidence of endocarditis.  We did have anxiety about the fourth metatarsal potentially being involved as well and therefore wished to give him more protracted antibiotics beyond the 4 weeks.   He is now completed his 4 weeks of IV antibiotic therapy.  He has been followed up by podiatry.  His wound still has sutures in place and is not draining any purulent material.  PICC line is now coming out but was secured.  He tells me his hemoglobin A1c was 7.9 last year but he thinks is worse in recent times that he says is been much more aggressive about his control of his diabetes after being hospitalized with his severe infection.     Past Medical History:  Diagnosis Date  . Diabetes ([facility])   . Hyperlipidemia   . Hypertension   . NSTEMI (non-ST elevated myocardial infarction) ([facility])   . Obesity   . Sleep apnea     Past Surgical History:  Procedure Laterality Date  . ACHILLES TENDON REPAIR Right   . AMPUTATION TOE Right 03/14/2019   Procedure: AMPUTATION TOE, 5th toe;  Surgeon: Landis Martins, DPM;  Location: [facility];  Service: Podiatry;  Laterality: Right;  . heart stent  2012   x1  . INCISION AND DRAINAGE Right 03/14/2019   Procedure: INCISION AND DRAINAGE;  Surgeon: Landis Martins, DPM;  Location: [facility];  Service: Podiatry;   Laterality: Right;  . SHOULDER ARTHROSCOPY Right   . TEE WITHOUT CARDIOVERSION N/A 03/17/2019   Procedure: TRANSESOPHAGEAL ECHOCARDIOGRAM (TEE);  Surgeon: Skeet Latch, MD;  Location: [facility];  Service: Cardiovascular;  Laterality: N/A;  . TOE SURGERY Right 06/2018   for a hammer toe  . UVULOPALATOPHARYNGOPLASTY      Family History  Problem Relation Age of Onset  . Cancer Other   . Diabetes Other   . Heart disease Other   . Breast cancer Mother   . Heart disease Father   . Diabetes Father   . Leukemia Brother   . Asthma Maternal Uncle       Social History   Socioeconomic History  . Marital status: Married    Spouse name: Not on file  . Number of children: Not on file  . Years of education: Not on file  . Highest education level: Not on file  Occupational History  . Not on file  Tobacco Use  . Smoking status: Passive Smoke Exposure - Never Smoker  . Smokeless tobacco: Never Used  . Tobacco comment: Both parents growing up.   Substance and Sexual Activity  . Alcohol use: Yes    Comment: 4-5 on weekends  . Drug use: No  . Sexual activity: Not on file  Other Topics Concern  . Not on file  Social History Narrative   Originally from [place], [place]. Previously has lived in [place]. 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 No pets currently. No bird or hot tub exposure. Could have mold in his current home and has lived there for 1.5 years now.    Social Determinants of Health   Financial Resource Strain:   . Difficulty of Paying Living Expenses: Not on file  Food Insecurity:   . Worried About Charity fundraiser in the Last Year: Not on file  . Ran Out of Food in the Last Year: Not on file  Transportation Needs:   . Lack of Transportation (Medical): Not on file  . Lack of Transportation (Non-Medical): Not on file  Physical Activity:   . Days of Exercise per Week: Not on file  . Minutes of Exercise per Session:  Not on file  Stress:   . Feeling of Stress : Not on file  Social Connections:   . Frequency of Communication with Friends and Family: Not on file  . Frequency of Social Gatherings with Friends and Family: Not on file  . Attends Religious Services: Not on file  . Active Member of Clubs or Organizations: Not on file  . Attends Archivist Meetings: Not on file  . Marital Status: Not on file    No Known Allergies   Current Outpatient Medications:  .  amLODipine (NORVASC) 5 MG tablet, Take 5 mg by mouth daily., Disp: , Rfl:  .  aspirin EC 81 MG tablet, Take 81 mg by mouth daily., Disp: , Rfl:  .  BD INSULIN SYRINGE U/F 31G X 5/16" 1 ML MISC, See admin instructions., Disp: , Rfl:  .  ceFAZolin (ANCEF) IVPB, Inject 2 g into the vein every 8 (eight) hours. Indication:  Foot wound complicated by abscess/cellulitis and MSSA bacteremia Last Day of Therapy:  04/15/2019 Labs - Once weekly:  CBC/D and BMP, Labs - Every other week:  ESR and CRP, Disp: 87 Units, Rfl: 0 .  cholecalciferol (VITAMIN D3) 25 MCG (1000 UNIT) tablet, Take 1,000 Units by mouth daily., Disp: , Rfl:  .  gabapentin (NEURONTIN) 300 MG capsule, Take 1 capsule (300 mg total) by mouth at bedtime., Disp: 90 capsule, Rfl: 3 .  glimepiride (AMARYL) 4 MG tablet, Take 4 mg by mouth daily with breakfast. , Disp: , Rfl:  .  insulin NPH-regular Human (NOVOLIN 70/30) (70-30) 100 UNIT/ML injection, Inject 40-50 Units into the skin 2 (two) times daily with a meal. , Disp: , Rfl:  .  loratadine (CLARITIN) 10 MG tablet, Take 10 mg by mouth daily., Disp: , Rfl:  .  metFORMIN (GLUCOPHAGE-XR) 500 MG 24 hr tablet, Take 1,000 mg by mouth 2 (two) times daily. , Disp: , Rfl:  .  montelukast (SINGULAIR) 10 MG tablet, TAKE 1 TABLET BY MOUTH AT BEDTIME, Disp: 30 tablet, Rfl: 3 .  pantoprazole (PROTONIX) 40 MG tablet, TAKE 1 TABLET BY MOUTH TWICE DAILY, Disp: 60 tablet, Rfl: 3 .  simvastatin (ZOCOR) 40 MG tablet, Take 40 mg by mouth every evening. ,  Disp: , Rfl: 11 .  Spacer/Aero-Holding Chambers (AEROCHAMBER MV) inhaler, Use as instructed, Disp: 1 each, Rfl: 0 .  vitamin B-12 (CYANOCOBALAMIN) 1000 MCG tablet, Take 1,000 mcg by mouth daily., Disp: , Rfl:    Review of Systems  Constitutional: Negative for activity change, appetite change, chills, diaphoresis, fatigue, fever and unexpected weight change.  HENT: Negative for congestion, rhinorrhea, sinus pressure, sneezing, sore throat and trouble swallowing.   Eyes: Negative for photophobia and visual disturbance.  Respiratory:  Negative for cough, chest tightness, shortness of breath, wheezing and stridor.   Cardiovascular: Negative for chest pain, palpitations and leg swelling.  Gastrointestinal: Negative for abdominal distention, abdominal pain, anal bleeding, blood in stool, constipation, diarrhea, nausea and vomiting.  Genitourinary: Negative for difficulty urinating, dysuria, flank pain and hematuria.  Musculoskeletal: Negative for arthralgias, back pain, gait problem, joint swelling and myalgias.  Skin: Positive for wound. Negative for color change, pallor and rash.  Neurological: Negative for dizziness, tremors, weakness and light-headedness.  Hematological: Negative for adenopathy. Does not bruise/bleed easily.  Psychiatric/Behavioral: Negative for agitation, behavioral problems, confusion, decreased concentration, dysphoric mood, hallucinations, sleep disturbance and suicidal ideas. The patient is not nervous/anxious and is not hyperactive.        Objective:   Physical Exam Constitutional:      General: He is not in acute distress.    Appearance: Normal appearance. He is well-developed. He is not ill-appearing or diaphoretic.  HENT:     Head: Normocephalic and atraumatic.     Right Ear: Hearing and external ear normal.     Left Ear: Hearing and external ear normal.     Nose: No nasal deformity or rhinorrhea.  Eyes:     General: No scleral icterus.    Extraocular  Movements: Extraocular movements intact.     Conjunctiva/sclera: Conjunctivae normal.     Right eye: Right conjunctiva is not injected.     Left eye: Left conjunctiva is not injected.  Neck:     Vascular: No JVD.  Cardiovascular:     Rate and Rhythm: Normal rate and regular rhythm.     Heart sounds: S1 normal and S2 normal.  Abdominal:     General: Bowel sounds are normal. There is no distension.     Palpations: Abdomen is soft.     Tenderness: There is no abdominal tenderness.  Musculoskeletal:        General: Normal range of motion.     Right shoulder: Normal.     Left shoulder: Normal.     Cervical back: Normal range of motion and neck supple.     Right hip: Normal.     Left hip: Normal.     Right knee: Normal.     Left knee: Normal.  Lymphadenopathy:     Head:     Right side of head: No submandibular, preauricular or posterior auricular adenopathy.     Left side of head: No submandibular, preauricular or posterior auricular adenopathy.     Cervical: No cervical adenopathy.     Right cervical: No superficial or deep cervical adenopathy.    Left cervical: No superficial or deep cervical adenopathy.  Skin:    General: Skin is warm and dry.     Coloration: Skin is not pale.     Findings: No abrasion, bruising, ecchymosis, erythema, lesion or rash.     Nails: There is no clubbing.  Neurological:     General: No focal deficit present.     Mental Status: He is alert and oriented to person, place, and time.     Sensory: No sensory deficit.     Coordination: Coordination normal.     Gait: Gait normal.  Psychiatric:        Attention and Perception: He is attentive.        Mood and Affect: Mood normal.        Speech: Speech normal.        Behavior: Behavior normal. Behavior is cooperative.  Thought Content: Thought content normal.        Judgment: Judgment normal.     Right foot 3 04/14/2019:      Left foot 04/15/2019:       PICC line 04/15/2019:          Assessment & Plan:  Diabetic foot infection with osteomyelitis of fifth metatarsal complicated by methicillin sensitive staph coccus aureus bacteremia: He cleared his blood cultures his TEE was negative and he underwent resection of his fifth metatarsal with MSSA and E. coli being recovered on culture.  He is completed 4 weeks of IV antibiotics.  I would like him to be go on Keflex 500 mg 4 times daily and to see me in follow-up prior to consideration of discontinuing this antibiotic.  He will need close follow-up by podiatry.  He needs to continue to optimize control of his diabetes mellitus and offload feet.  He he may benefit from treatment for his onychomycosis with systemic Lamisil.

## 2019-04-15 NOTE — Progress Notes (Signed)
Subjective: 59 year old diabetic male patient seen in office for follow-up evaluation patient is status post right fifth toe and distal metatarsal amputation for acute osteomyelitis on 03/14/2019.  Patient reports that home nurse has been helping to change the dressings on Sunday like before, patient denies nausea vomiting fever chills or any constitutional symptoms reports that his blood sugar this morning was 76  and denies any issues with his PICC line antibiotics to see ID today.  Patient Active Problem List   Diagnosis Date Noted  . Cellulitis of right foot   . S/P foot surgery, right   . MSSA bacteremia   . Acute sepsis ([facility]) 03/13/2019  . Osteomyelitis of fifth toe of right foot ([facility]) 03/13/2019  . DKA (diabetic ketoacidosis) ([facility]) 03/13/2019  . Acute osteomyelitis of toe of right foot ([facility])   . AKI (acute kidney injury) ([facility])   . Demand ischemia ([facility])   . Hyperglycemia   . Laryngopharyngeal reflux (LPR) 02/07/2016  . GERD (gastroesophageal reflux disease) 12/27/2015  . Arthritis 11/16/2015  . Diabetes mellitus ([facility]) 11/16/2015  . Hyperlipidemia 11/16/2015  . Localized edema 11/16/2015  . OBESITY 05/15/2007  . ADD 05/15/2007  . Obesity, diabetes, and hypertension syndrome ([facility]) 05/15/2007  . Allergic rhinitis 05/15/2007  . Obstructive sleep apnea 05/15/2007  . COUGH 05/15/2007    Objective General: No acute distress  Right Lower extremity: At amputation wound site, packing present at distal wound site and measures less than 0.5x0.6 cm with a granular base. Decreased erythema, decreased edema, mild bloody drainage with decreased maceration. No other acute signs of infection. No calf pain. Range of motion excluding surgical site within normal limits with no pain or crepitation. S/p 5th partial ray amputation.    Problem List Items Addressed This Visit      Endocrine   Diabetes mellitus ([facility]CC)     Other   S/P foot surgery, right - Primary    Other Visit Diagnoses    Acute  osteomyelitis of right ankle or foot ([facility])       Cellulitis and abscess of foot, except toes       Foot ulcer, right, with fat layer exposed ([facility])          -Patient seen and evaluat -Dressing change performed; applied guaze packing to the right foot covered with dry dressing -Advised patient to make sure to keep dressing clean, dry, and intact to right foot allowing nurse or family member to change dressing as instructed -Continue with PICC line antibiotics with recommendations from infectious disease with follow up as scheduled on today -Continue with Weightbearing to heel with post op shoe  -Continue with rest and elevation to assist with pain and edema control  -Continue with Gabapentin as needed for sharp shooting pains likely related to diabetic neuropathy -Return to office within 1 week for postop wound care.  Advised patient that sutures will stay in at least for another week or so.

## 2019-04-22 ENCOUNTER — Encounter: Payer: Self-pay | Admitting: Sports Medicine

## 2019-04-22 ENCOUNTER — Ambulatory Visit (INDEPENDENT_AMBULATORY_CARE_PROVIDER_SITE_OTHER): Payer: BC Managed Care – PPO | Admitting: Sports Medicine

## 2019-04-22 ENCOUNTER — Other Ambulatory Visit: Payer: Self-pay

## 2019-04-22 ENCOUNTER — Encounter: Payer: BC Managed Care – PPO | Admitting: Sports Medicine

## 2019-04-22 DIAGNOSIS — L03119 Cellulitis of unspecified part of limb: Secondary | ICD-10-CM

## 2019-04-22 DIAGNOSIS — Z9889 Other specified postprocedural states: Secondary | ICD-10-CM

## 2019-04-22 DIAGNOSIS — M86171 Other acute osteomyelitis, right ankle and foot: Secondary | ICD-10-CM

## 2019-04-22 DIAGNOSIS — E1140 Type 2 diabetes mellitus with diabetic neuropathy, unspecified: Secondary | ICD-10-CM

## 2019-04-22 DIAGNOSIS — L02619 Cutaneous abscess of unspecified foot: Secondary | ICD-10-CM

## 2019-04-22 NOTE — Progress Notes (Signed)
Subjective: 59 year old diabetic male patient seen in office for follow-up evaluation patient is status post right fifth toe and distal metatarsal amputation for acute osteomyelitis on 03/14/2019.  Patient reports that his girlfriend changed dressing on Sunday, patient denies nausea vomiting fever chills or any constitutional symptoms reports that his blood sugar this morning was 126 and denies any issues with his PICC line removed 1 week ago..  Patient Active Problem List   Diagnosis Date Noted  . Cellulitis of right foot   . S/P foot surgery, right   . MSSA bacteremia   . Acute sepsis ([facility]) 03/13/2019  . Osteomyelitis of fifth toe of right foot ([facility]CC) 03/13/2019  . DKA (diabetic ketoacidosis) ([facility]) 03/13/2019  . Acute osteomyelitis of toe of right foot ([facility]CC)   . AKI (acute kidney injury) ([facility])   . Demand ischemia ([facility])   . Hyperglycemia   . Laryngopharyngeal reflux (LPR) 02/07/2016  . GERD (gastroesophageal reflux disease) 12/27/2015  . Arthritis 11/16/2015  . Diabetes mellitus ([facility]) 11/16/2015  . Hyperlipidemia 11/16/2015  . Localized edema 11/16/2015  . OBESITY 05/15/2007  . ADD 05/15/2007  . Obesity, diabetes, and hypertension syndrome ([facility]) 05/15/2007  . Allergic rhinitis 05/15/2007  . Obstructive sleep apnea 05/15/2007  . COUGH 05/15/2007    Objective General: No acute distress  Right Lower extremity: At amputation wound site, packing present at distal wound site and measures less than 0.5x0.4cm with a granular base. Decreased erythema, decreased edema, mild bloody drainage with decreased maceration. No other acute signs of infection. No calf pain. Range of motion excluding surgical site within normal limits with no pain or crepitation. S/p 5th partial ray amputation.    Problem List Items Addressed This Visit      Endocrine   Diabetes mellitus ([facility])     Other   S/P foot surgery, right - Primary    Other Visit Diagnoses    Acute osteomyelitis of right ankle or foot  ([facility])       Cellulitis and abscess of foot, except toes          -Patient seen and evaluatued -Dressing change performed; applied guaze packing to the right foot covered with dry dressing -Advised patient to make sure to keep dressing clean, dry, and intact to right foot allowing nurse or family member to change dressing as instructed -Continue with Weightbearing to heel with post op shoe  -Continue with rest and elevation to assist with pain and edema control  -Return to office within 1 week for postop wound care and possible suture removal.

## 2019-04-29 ENCOUNTER — Encounter: Payer: BC Managed Care – PPO | Admitting: Sports Medicine

## 2019-04-29 ENCOUNTER — Ambulatory Visit (INDEPENDENT_AMBULATORY_CARE_PROVIDER_SITE_OTHER): Payer: BC Managed Care – PPO | Admitting: Sports Medicine

## 2019-04-29 ENCOUNTER — Other Ambulatory Visit: Payer: Self-pay

## 2019-04-29 ENCOUNTER — Encounter: Payer: Self-pay | Admitting: Sports Medicine

## 2019-04-29 DIAGNOSIS — L02619 Cutaneous abscess of unspecified foot: Secondary | ICD-10-CM

## 2019-04-29 DIAGNOSIS — E1140 Type 2 diabetes mellitus with diabetic neuropathy, unspecified: Secondary | ICD-10-CM

## 2019-04-29 DIAGNOSIS — M86171 Other acute osteomyelitis, right ankle and foot: Secondary | ICD-10-CM

## 2019-04-29 DIAGNOSIS — L03119 Cellulitis of unspecified part of limb: Secondary | ICD-10-CM

## 2019-04-29 DIAGNOSIS — Z9889 Other specified postprocedural states: Secondary | ICD-10-CM

## 2019-04-29 DIAGNOSIS — L97511 Non-pressure chronic ulcer of other part of right foot limited to breakdown of skin: Secondary | ICD-10-CM

## 2019-04-29 NOTE — Progress Notes (Signed)
Subjective: 59 year old diabetic male patient seen in office for follow-up evaluation patient is status post right fifth toe and distal metatarsal amputation for acute osteomyelitis on 03/14/2019.  Patient reports that his sister changed dressing on Sunday, patient denies nausea vomiting fever chills or any constitutional symptoms reports that his blood sugar this morning was 128 and denies any issues with his surgical foot.  Patient Active Problem List   Diagnosis Date Noted  . Cellulitis of right foot   . S/P foot surgery, right   . MSSA bacteremia   . Acute sepsis ([facility]) 03/13/2019  . Osteomyelitis of fifth toe of right foot ([facility]CC) 03/13/2019  . DKA (diabetic ketoacidosis) ([facility]) 03/13/2019  . Acute osteomyelitis of toe of right foot ([facility]CC)   . AKI (acute kidney injury) ([facility])   . Demand ischemia ([facility])   . Hyperglycemia   . Laryngopharyngeal reflux (LPR) 02/07/2016  . GERD (gastroesophageal reflux disease) 12/27/2015  . Arthritis 11/16/2015  . Diabetes mellitus ([facility]) 11/16/2015  . Hyperlipidemia 11/16/2015  . Localized edema 11/16/2015  . OBESITY 05/15/2007  . ADD 05/15/2007  . Obesity, diabetes, and hypertension syndrome ([facility]CC) 05/15/2007  . Allergic rhinitis 05/15/2007  . Obstructive sleep apnea 05/15/2007  . COUGH 05/15/2007    Objective General: No acute distress  Right Lower extremity: At amputation wound site, packing present at distal wound site and measures less than 0.4x0.3cm smaller in nature with a granular base. Decreased erythema, decreased edema, mild bloody drainage with decreased maceration. No other acute signs of infection. No calf pain. Range of motion excluding surgical site within normal limits with no pain or crepitation. S/p 5th partial ray amputation.    Problem List Items Addressed This Visit      Endocrine   Diabetes mellitus ([facility])     Other   S/P foot surgery, right - Primary    Other Visit Diagnoses    Acute osteomyelitis of right ankle or foot ([facility]CC)        Cellulitis and abscess of foot, except toes       Foot ulcer, right, limited to breakdown of skin ([facility])          -Patient seen and evaluatued -Few sutures were removed -Dressing change performed; applied guaze and Betadine to the right foot covered with dry dressing -Advised patient to make sure to keep dressing clean, dry, and intact to right foot allowing girlfriend or family member to change dressing as instructed -Continue with Weightbearing to heel with post op shoe  -Continue with rest and elevation to assist with pain and edema control  -Return to office within 1 week for postop wound care and possibly finishing the suture removal.

## 2019-05-06 ENCOUNTER — Other Ambulatory Visit: Payer: Self-pay

## 2019-05-06 ENCOUNTER — Ambulatory Visit (INDEPENDENT_AMBULATORY_CARE_PROVIDER_SITE_OTHER): Payer: BC Managed Care – PPO | Admitting: Sports Medicine

## 2019-05-06 ENCOUNTER — Encounter: Payer: BC Managed Care – PPO | Admitting: Sports Medicine

## 2019-05-06 ENCOUNTER — Encounter: Payer: Self-pay | Admitting: Sports Medicine

## 2019-05-06 DIAGNOSIS — Z9889 Other specified postprocedural states: Secondary | ICD-10-CM

## 2019-05-06 DIAGNOSIS — M86171 Other acute osteomyelitis, right ankle and foot: Secondary | ICD-10-CM

## 2019-05-06 DIAGNOSIS — L97511 Non-pressure chronic ulcer of other part of right foot limited to breakdown of skin: Secondary | ICD-10-CM

## 2019-05-06 DIAGNOSIS — E1140 Type 2 diabetes mellitus with diabetic neuropathy, unspecified: Secondary | ICD-10-CM

## 2019-05-06 DIAGNOSIS — L03119 Cellulitis of unspecified part of limb: Secondary | ICD-10-CM

## 2019-05-06 DIAGNOSIS — L02619 Cutaneous abscess of unspecified foot: Secondary | ICD-10-CM

## 2019-05-06 NOTE — Progress Notes (Signed)
Subjective: 59 year old diabetic male patient seen in office for follow-up evaluation patient is status post right fifth toe and distal metatarsal amputation for acute osteomyelitis on 03/14/2019.  Patient reports that his sister changed dressing on Sunday, patient reports that he has been dizzy, denies nausea vomiting fever chills or any constitutional symptoms reports that his blood sugar this morning was 116, 7.2 and denies any issues with his surgical foot.  Patient Active Problem List   Diagnosis Date Noted  . Cellulitis of right foot   . S/P foot surgery, right   . MSSA bacteremia   . Acute sepsis ([facility]) 03/13/2019  . Osteomyelitis of fifth toe of right foot (HCC) 03/13/2019  . DKA (diabetic ketoacidosis) ([facility]CC) 03/13/2019  . Acute osteomyelitis of toe of right foot (HCC)   . AKI (acute kidney injury) ([facility]CC)   . Demand ischemia (HCC)   . Hyperglycemia   . Laryngopharyngeal reflux (LPR) 02/07/2016  . GERD (gastroesophageal reflux disease) 12/27/2015  . Arthritis 11/16/2015  . Diabetes mellitus ([facility]CC) 11/16/2015  . Hyperlipidemia 11/16/2015  . Localized edema 11/16/2015  . OBESITY 05/15/2007  . ADD 05/15/2007  . Obesity, diabetes, and hypertension syndrome ([facility]CC) 05/15/2007  . Allergic rhinitis 05/15/2007  . Obstructive sleep apnea 05/15/2007  . COUGH 05/15/2007    Objective General: No acute distress  Right Lower extremity: At amputation wound site, sutures intact, distal wound site  measures less than 0.2x0.2cm smaller in nature with a granular base with significant dry scabbing along the incision area. Decreased erythema, decreased edema, mild bloody drainage with no maceration. No other acute signs of infection. No calf pain. Range of motion excluding surgical site within normal limits with no pain or crepitation. S/p 5th partial ray amputation.    Problem List Items Addressed This Visit      Endocrine   Diabetes mellitus ([facility]CC)     Other   S/P foot surgery, right - Primary     Other Visit Diagnoses    Foot ulcer, right, limited to breakdown of skin (HCC)       Acute osteomyelitis of right ankle or foot (HCC)       Cellulitis and abscess of foot, except toes          -Patient seen and evaluatued -Remaining sutures were removed -Dressing change performed; applied guaze and medihoney to the right foot covered with dry dressing -Advised patient to see PCP regarding blood pressure 184/108 and symptoms of feeling dizzy -Advised patient to make sure to keep dressing clean, dry, and intact to right foot allowing girlfriend or family member to change dressing as instructed -Continue with Weightbearing to heel with post op shoe  -Continue with rest and elevation to assist with pain and edema control  -Return to office within 1 week for postop wound care/check.

## 2019-05-13 ENCOUNTER — Other Ambulatory Visit: Payer: Self-pay

## 2019-05-13 ENCOUNTER — Encounter: Payer: BC Managed Care – PPO | Admitting: Sports Medicine

## 2019-05-13 ENCOUNTER — Ambulatory Visit (INDEPENDENT_AMBULATORY_CARE_PROVIDER_SITE_OTHER): Payer: BC Managed Care – PPO | Admitting: Sports Medicine

## 2019-05-13 ENCOUNTER — Encounter: Payer: Self-pay | Admitting: Sports Medicine

## 2019-05-13 DIAGNOSIS — G4733 Obstructive sleep apnea (adult) (pediatric): Secondary | ICD-10-CM | POA: Diagnosis not present

## 2019-05-13 DIAGNOSIS — L97511 Non-pressure chronic ulcer of other part of right foot limited to breakdown of skin: Secondary | ICD-10-CM

## 2019-05-13 DIAGNOSIS — L03119 Cellulitis of unspecified part of limb: Secondary | ICD-10-CM

## 2019-05-13 DIAGNOSIS — Z9889 Other specified postprocedural states: Secondary | ICD-10-CM

## 2019-05-13 DIAGNOSIS — M86171 Other acute osteomyelitis, right ankle and foot: Secondary | ICD-10-CM

## 2019-05-13 DIAGNOSIS — L02619 Cutaneous abscess of unspecified foot: Secondary | ICD-10-CM

## 2019-05-13 DIAGNOSIS — E1140 Type 2 diabetes mellitus with diabetic neuropathy, unspecified: Secondary | ICD-10-CM

## 2019-05-13 NOTE — Progress Notes (Signed)
Subjective: 59 year old diabetic male patient seen in office for follow-up evaluation patient is status post right fifth toe and distal metatarsal amputation for acute osteomyelitis on 03/14/2019.  Patient reports that he is doing good and his girlfriend change the dressing once a day as recommended with no problems.  Reports that his blood sugar this morning was 131, 7.2 and denies any issues with his surgical foot.  Reports that previous episodes of dizziness has now resolved.  Patient Active Problem List   Diagnosis Date Noted  . Cellulitis of right foot   . S/P foot surgery, right   . MSSA bacteremia   . Acute sepsis ([facility]) 03/13/2019  . Osteomyelitis of fifth toe of right foot (HCC) 03/13/2019  . DKA (diabetic ketoacidosis) (HCC) 03/13/2019  . Acute osteomyelitis of toe of right foot (HCC)   . AKI (acute kidney injury) ([facility]CC)   . Demand ischemia (HCC)   . Hyperglycemia   . Laryngopharyngeal reflux (LPR) 02/07/2016  . GERD (gastroesophageal reflux disease) 12/27/2015  . Arthritis 11/16/2015  . Diabetes mellitus (HCC) 11/16/2015  . Hyperlipidemia 11/16/2015  . Localized edema 11/16/2015  . OBESITY 05/15/2007  . ADD 05/15/2007  . Obesity, diabetes, and hypertension syndrome (HCC) 05/15/2007  . Allergic rhinitis 05/15/2007  . Obstructive sleep apnea 05/15/2007  . COUGH 05/15/2007    Objective General: No acute distress  Right Lower extremity: At amputation wound site, sutures intact, distal wound site  measures less than 0.2x0.1cm smaller in nature with a granular base. Decreased erythema, decreased edema, mild bloody drainage with no maceration. No other acute signs of infection. No calf pain. Range of motion excluding surgical site within normal limits with no pain or crepitation. S/p 5th partial ray amputation.    Problem List Items Addressed This Visit      Endocrine   Diabetes mellitus (HCC)     Other   S/P foot surgery, right - Primary    Other Visit Diagnoses    Foot  ulcer, right, limited to breakdown of skin (HCC)       Acute osteomyelitis of right ankle or foot (HCC)       Cellulitis and abscess of foot, except toes          -Patient seen and evaluatued -Dressing change performed; applied guaze and medihoney to the right foot covered with dry dressing and advised patient to have his girlfriend to continue to help with changing the dressings on next week she will change it twice since I will be out of office -Continue with Weightbearing to heel with post op shoe; replacement shoe given -Continue with rest and elevation to assist with pain and edema control  -Return to office within 1 week for postop wound care/check.

## 2019-05-18 ENCOUNTER — Encounter: Payer: Self-pay | Admitting: Infectious Disease

## 2019-05-18 ENCOUNTER — Ambulatory Visit (INDEPENDENT_AMBULATORY_CARE_PROVIDER_SITE_OTHER): Payer: BC Managed Care – PPO | Admitting: Infectious Disease

## 2019-05-18 ENCOUNTER — Other Ambulatory Visit: Payer: Self-pay

## 2019-05-18 VITALS — BP 185/101 | HR 80 | Wt 308.0 lb

## 2019-05-18 DIAGNOSIS — M869 Osteomyelitis, unspecified: Secondary | ICD-10-CM | POA: Diagnosis not present

## 2019-05-18 DIAGNOSIS — Z9889 Other specified postprocedural states: Secondary | ICD-10-CM

## 2019-05-18 DIAGNOSIS — B351 Tinea unguium: Secondary | ICD-10-CM

## 2019-05-18 DIAGNOSIS — B9561 Methicillin susceptible Staphylococcus aureus infection as the cause of diseases classified elsewhere: Secondary | ICD-10-CM

## 2019-05-18 DIAGNOSIS — R7881 Bacteremia: Secondary | ICD-10-CM | POA: Diagnosis not present

## 2019-05-18 DIAGNOSIS — E1142 Type 2 diabetes mellitus with diabetic polyneuropathy: Secondary | ICD-10-CM

## 2019-05-18 NOTE — Progress Notes (Signed)
Subjective:  Chief complaint follow-up for diabetic foot infection osteomyelitis and Staph aureus bacteremia  Patient ID: [name], male    DOB: 08-Jul-1960, 59 y.o.   MRN: [id number]  HPI   59 y.o. male with diabetes and foot wound complicated by abscess/cellulitis and MSSA bacteremia. Underwent 5th toe I&D and distal metatarsal amputation. Noted to have purulence draining from the incision site and puss draining from plantar ulceration site. 5th metatarsal head was resected back to the level of midshaft.  Cultures from the operative site yielded MSSA but also an E. coli that was sensitive to cefazolin.  Transesophageal echocardiogram failed to show evidence of endocarditis.  We did have anxiety about the fourth metatarsal potentially being involved as well and therefore wished to give him more protracted antibiotics beyond the 4 weeks.   He is now completed his 4 weeks of IV antibiotic therapy.  He has been followed up by podiatry.   Had him continue Keflex orally after he finished IV antibiotics and he completed antibiotics last week.  In the interim his wound is continue to improve and sutures have been removed and has been followed closely again by podiatry     Past Medical History:  Diagnosis Date  . Diabetes (HCC)   . Hyperlipidemia   . Hypertension   . NSTEMI (non-ST elevated myocardial infarction) (HCC)   . Obesity   . Sleep apnea     Past Surgical History:  Procedure Laterality Date  . ACHILLES TENDON REPAIR Right   . AMPUTATION TOE Right 03/14/2019   Procedure: AMPUTATION TOE, 5th toe;  Surgeon: [name], DPM;  Location: [facility] OR;  Service: Podiatry;  Laterality: Right;  . heart stent  2012   x1  . INCISION AND DRAINAGE Right 03/14/2019   Procedure: INCISION AND DRAINAGE;  Surgeon: [name], DPM;  Location: [facility] OR;  Service: Podiatry;  Laterality: Right;  . SHOULDER ARTHROSCOPY Right   . TEE WITHOUT CARDIOVERSION N/A 03/17/2019   Procedure:  TRANSESOPHAGEAL ECHOCARDIOGRAM (TEE);  Surgeon: Chilton Si, MD;  Location: [facility] ENDOSCOPY;  Service: Cardiovascular;  Laterality: N/A;  . TOE SURGERY Right 06/2018   for a hammer toe  . UVULOPALATOPHARYNGOPLASTY      Family History  Problem Relation Age of Onset  . Cancer Other   . Diabetes Other   . Heart disease Other   . Breast cancer Mother   . Heart disease Father   . Diabetes Father   . Leukemia Brother   . Asthma Maternal Uncle       Social History   Socioeconomic History  . Marital status: Married    Spouse name: Not on file  . Number of children: Not on file  . Years of education: Not on file  . Highest education level: Not on file  Occupational History  . Not on file  Tobacco Use  . Smoking status: Passive Smoke Exposure - Never Smoker  . Smokeless tobacco: Never Used  . Tobacco comment: Both parents growing up.   Substance and Sexual Activity  . Alcohol use: Yes    Comment: 4-5 on weekends  . Drug use: No  . Sexual activity: Not on file  Other Topics Concern  . Not on file  Social History Narrative   Originally from [place], [place]. Previously has lived in [facility] & [place]. 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 No pets currently. No bird or hot tub exposure. Could have mold in  his current home and has lived there for 1.5 years now.    Social Determinants of Health   Financial Resource Strain:   . Difficulty of Paying Living Expenses:   Food Insecurity:   . Worried About Charity fundraiser in the Last Year:   . Arboriculturist in the Last Year:   Transportation Needs:   . Film/video editor (Medical):   Marland Kitchen Lack of Transportation (Non-Medical):   Physical Activity:   . Days of Exercise per Week:   . Minutes of Exercise per Session:   Stress:   . Feeling of Stress :   Social Connections:   . Frequency of Communication with Friends and Family:   . Frequency of Social Gatherings with Friends and Family:    . Attends Religious Services:   . Active Member of Clubs or Organizations:   . Attends Archivist Meetings:   Marland Kitchen Marital Status:     No Known Allergies   Current Outpatient Medications:  .  amLODipine (NORVASC) 5 MG tablet, Take 5 mg by mouth daily., Disp: , Rfl:  .  aspirin EC 81 MG tablet, Take 81 mg by mouth daily., Disp: , Rfl:  .  BD INSULIN SYRINGE U/F 31G X 5/16" 1 ML MISC, See admin instructions., Disp: , Rfl:  .  cephALEXin (KEFLEX) 500 MG capsule, Take 1 capsule (500 mg total) by mouth 4 (four) times daily., Disp: 120 capsule, Rfl: 1 .  cholecalciferol (VITAMIN D3) 25 MCG (1000 UNIT) tablet, Take 1,000 Units by mouth daily., Disp: , Rfl:  .  gabapentin (NEURONTIN) 300 MG capsule, Take 1 capsule (300 mg total) by mouth at bedtime., Disp: 90 capsule, Rfl: 3 .  glimepiride (AMARYL) 4 MG tablet, Take 4 mg by mouth daily with breakfast. , Disp: , Rfl:  .  insulin NPH-regular Human (NOVOLIN 70/30) (70-30) 100 UNIT/ML injection, Inject 40-50 Units into the skin 2 (two) times daily with a meal. , Disp: , Rfl:  .  loratadine (CLARITIN) 10 MG tablet, Take 10 mg by mouth daily., Disp: , Rfl:  .  metFORMIN (GLUCOPHAGE-XR) 500 MG 24 hr tablet, Take 1,000 mg by mouth 2 (two) times daily. , Disp: , Rfl:  .  montelukast (SINGULAIR) 10 MG tablet, TAKE 1 TABLET BY MOUTH AT BEDTIME, Disp: 30 tablet, Rfl: 3 .  pantoprazole (PROTONIX) 40 MG tablet, TAKE 1 TABLET BY MOUTH TWICE DAILY, Disp: 60 tablet, Rfl: 3 .  simvastatin (ZOCOR) 40 MG tablet, Take 40 mg by mouth every evening. , Disp: , Rfl: 11 .  Spacer/Aero-Holding Chambers (AEROCHAMBER MV) inhaler, Use as instructed, Disp: 1 each, Rfl: 0 .  vitamin B-12 (CYANOCOBALAMIN) 1000 MCG tablet, Take 1,000 mcg by mouth daily., Disp: , Rfl:    Review of Systems  Constitutional: Negative for activity change, appetite change, chills, diaphoresis, fatigue, fever and unexpected weight change.  HENT: Negative for congestion, rhinorrhea, sinus  pressure, sneezing, sore throat and trouble swallowing.   Eyes: Negative for photophobia and visual disturbance.  Respiratory: Negative for apnea, cough, chest tightness, shortness of breath, wheezing and stridor.   Cardiovascular: Negative for chest pain, palpitations and leg swelling.  Gastrointestinal: Negative for abdominal distention, abdominal pain, anal bleeding, blood in stool, constipation, diarrhea, nausea and vomiting.  Genitourinary: Negative for difficulty urinating, dysuria, flank pain and hematuria.  Musculoskeletal: Negative for arthralgias, back pain, gait problem, joint swelling and myalgias.  Skin: Positive for wound. Negative for color change, pallor and rash.  Neurological: Negative for  dizziness, tremors, weakness and light-headedness.  Hematological: Negative for adenopathy. Does not bruise/bleed easily.  Psychiatric/Behavioral: Negative for agitation, behavioral problems, confusion, decreased concentration, dysphoric mood, hallucinations, sleep disturbance and suicidal ideas. The patient is not nervous/anxious and is not hyperactive.        Objective:   Physical Exam Constitutional:      General: He is not in acute distress.    Appearance: Normal appearance. He is well-developed. He is not ill-appearing or diaphoretic.  HENT:     Head: Normocephalic and atraumatic.     Right Ear: Hearing and external ear normal.     Left Ear: Hearing and external ear normal.     Nose: No nasal deformity or rhinorrhea.  Eyes:     General: No scleral icterus.    Extraocular Movements: Extraocular movements intact.     Conjunctiva/sclera: Conjunctivae normal.     Right eye: Right conjunctiva is not injected.     Left eye: Left conjunctiva is not injected.  Neck:     Vascular: No JVD.  Cardiovascular:     Rate and Rhythm: Normal rate and regular rhythm.     Heart sounds: S1 normal and S2 normal.  Abdominal:     General: Bowel sounds are normal. There is no distension.      Palpations: Abdomen is soft.     Tenderness: There is no abdominal tenderness.  Musculoskeletal:        General: Normal range of motion.     Right shoulder: Normal.     Left shoulder: Normal.     Cervical back: Normal range of motion and neck supple.     Right hip: Normal.     Left hip: Normal.     Right knee: Normal.     Left knee: Normal.  Lymphadenopathy:     Head:     Right side of head: No submandibular, preauricular or posterior auricular adenopathy.     Left side of head: No submandibular, preauricular or posterior auricular adenopathy.     Cervical: No cervical adenopathy.     Right cervical: No superficial or deep cervical adenopathy.    Left cervical: No superficial or deep cervical adenopathy.  Skin:    General: Skin is warm and dry.     Coloration: Skin is not pale.     Findings: No abrasion, bruising, ecchymosis, erythema, lesion or rash.     Nails: There is no clubbing.  Neurological:     General: No focal deficit present.     Mental Status: He is alert and oriented to person, place, and time. Mental status is at baseline.     Sensory: No sensory deficit.     Coordination: Coordination normal.     Gait: Gait normal.  Psychiatric:        Attention and Perception: He is attentive.        Mood and Affect: Mood normal.        Speech: Speech normal.        Behavior: Behavior normal. Behavior is cooperative.        Thought Content: Thought content normal.        Judgment: Judgment normal.     Right foot 3 04/14/2019:      Right foot today May 18, 2019:         Left foot 04/15/2019:       PICC line 04/15/2019:        Assessment & Plan:  Diabetic foot infection with osteomyelitis of fifth  metatarsal complicated by methicillin sensitive staph coccus aureus bacteremia: He cleared his blood cultures his TEE was negative and he underwent resection of his fifth metatarsal with MSSA and E. coli being recovered on culture.  He is completed 4 weeks of IV  antibiotics. And Keflex 500 mg 4 times daily   I will recheck his inflammatory markers and if they are encouraging he can stay off antibiotics and come back and see [place] as needed   Onychomycosis: He says that he tried systemic Lamisil in the past but this did not make an effect on his toenail infection

## 2019-05-19 LAB — BASIC METABOLIC PANEL WITH GFR
BUN: 24 mg/dL (ref 7–25)
CO2: 24 mmol/L (ref 20–32)
Calcium: 9.4 mg/dL (ref 8.6–10.3)
Chloride: 101 mmol/L (ref 98–110)
Creat: 1.3 mg/dL (ref 0.70–1.33)
GFR, Est African American: 71 mL/min/{1.73_m2} (ref >=60)
GFR, Est Non African American: 61 mL/min/{1.73_m2} (ref >=60)
Glucose, Bld: 150 mg/dL — ABNORMAL HIGH (ref 65–99)
Potassium: 4.8 mmol/L (ref 3.5–5.3)
Sodium: 138 mmol/L (ref 135–146)

## 2019-05-19 LAB — C-REACTIVE PROTEIN: CRP: 5.5 mg/L (ref <8.0)

## 2019-05-19 LAB — SEDIMENTATION RATE: Sed Rate: 17 mm/h (ref 0–20)

## 2019-05-22 DIAGNOSIS — Z1211 Encounter for screening for malignant neoplasm of colon: Secondary | ICD-10-CM | POA: Diagnosis not present

## 2019-05-27 ENCOUNTER — Encounter: Payer: Self-pay | Admitting: Sports Medicine

## 2019-05-27 ENCOUNTER — Ambulatory Visit (INDEPENDENT_AMBULATORY_CARE_PROVIDER_SITE_OTHER): Payer: BC Managed Care – PPO | Admitting: Sports Medicine

## 2019-05-27 ENCOUNTER — Other Ambulatory Visit: Payer: Self-pay

## 2019-05-27 DIAGNOSIS — M86171 Other acute osteomyelitis, right ankle and foot: Secondary | ICD-10-CM

## 2019-05-27 DIAGNOSIS — Z9889 Other specified postprocedural states: Secondary | ICD-10-CM

## 2019-05-27 DIAGNOSIS — L97511 Non-pressure chronic ulcer of other part of right foot limited to breakdown of skin: Secondary | ICD-10-CM

## 2019-05-27 DIAGNOSIS — L02619 Cutaneous abscess of unspecified foot: Secondary | ICD-10-CM

## 2019-05-27 DIAGNOSIS — L03119 Cellulitis of unspecified part of limb: Secondary | ICD-10-CM

## 2019-05-27 DIAGNOSIS — E1140 Type 2 diabetes mellitus with diabetic neuropathy, unspecified: Secondary | ICD-10-CM

## 2019-05-27 NOTE — Progress Notes (Signed)
Subjective: 59 year old diabetic male patient seen in office for follow-up evaluation patient is status post right fifth toe and distal metatarsal amputation for acute osteomyelitis on 03/14/2019.  Patient reports that he is doing good wound seems to be healed reports that he went to infectious disease last week and was discharged.  Patient reports that his blood sugar was 86 this morning and denies any other pedal complaints at this time.    Patient Active Problem List   Diagnosis Date Noted  . Cellulitis of right foot   . S/P foot surgery, right   . MSSA bacteremia   . Acute sepsis (HCC) 03/13/2019  . Osteomyelitis of fifth toe of right foot (HCC) 03/13/2019  . DKA (diabetic ketoacidosis) (HCC) 03/13/2019  . Acute osteomyelitis of toe of right foot (HCC)   . AKI (acute kidney injury) (HCC)   . Demand ischemia (HCC)   . Hyperglycemia   . Laryngopharyngeal reflux (LPR) 02/07/2016  . GERD (gastroesophageal reflux disease) 12/27/2015  . Arthritis 11/16/2015  . Diabetes mellitus (HCC) 11/16/2015  . Hyperlipidemia 11/16/2015  . Localized edema 11/16/2015  . OBESITY 05/15/2007  . ADD 05/15/2007  . Obesity, diabetes, and hypertension syndrome (HCC) 05/15/2007  . Allergic rhinitis 05/15/2007  . Obstructive sleep apnea 05/15/2007  . COUGH 05/15/2007    Objective General: No acute distress  Right Lower extremity: At amputation wound site, wound is healed, no erythema, very minimal edema, no bloody drainage.  No other acute signs of infection. No calf pain. Range of motion excluding surgical site within normal limits with no pain or crepitation. S/p 5th partial ray amputation.    Problem List Items Addressed This Visit      Endocrine   Diabetes mellitus (HCC)     Other   S/P foot surgery, right - Primary    Other Visit Diagnoses    Foot ulcer, right, limited to breakdown of skin (HCC)       Healed   Acute osteomyelitis of right ankle or foot (HCC)       Cellulitis and abscess of  foot, except toes         -Patient seen and evaluatued -Wound has prematurely healed advised patient he may transition from surgical shoe to normal shoe may cover area with a Band-Aid as needed for protection advised patient when he is a normal shoe since he is a amputee now to wear a thicker socks to prevent shear pressure.  Advised patient that he may slowly increase activities to tolerance however must be very cautious to recheck foot periodically to make sure there is no rubbing when he is in a shoe when he is doing activities like beach or golfing, advised patient especially for beach to make sure he is wearing a water shoe. -Return to office in 1 to 2 weeks for a wound check Patient also requests to have laser treatment for his toenails and I advised patient that he may proceed with getting this done now that his surgical site has healed and advised patient that if after three treatments of laser for his nail fungus if they have not improved may add back on oral Lamisil.

## 2019-05-29 ENCOUNTER — Ambulatory Visit (INDEPENDENT_AMBULATORY_CARE_PROVIDER_SITE_OTHER): Payer: BC Managed Care – PPO | Admitting: Sports Medicine

## 2019-05-29 ENCOUNTER — Other Ambulatory Visit: Payer: Self-pay

## 2019-05-29 DIAGNOSIS — L603 Nail dystrophy: Secondary | ICD-10-CM

## 2019-05-29 DIAGNOSIS — B351 Tinea unguium: Secondary | ICD-10-CM

## 2019-05-29 NOTE — Patient Instructions (Signed)

## 2019-05-29 NOTE — Progress Notes (Signed)
Patient presents today for the 1st laser treatment. Diagnosed with mycotic nail infection by Dr. Marylene Land.   Toenail most affected are hallux nails bilateral and 2nd and 3rd right.  All other systems are negative.  Nails were filed thin. Laser therapy was administered to 1st bilateral and 2nd and 3rd nails right and patient tolerated the treatment well. All safety precautions were in place.    Follow up in 4 weeks for laser # 2.  Picture of nails taken today to document visual progress  Per Dr. Wynema Birch note-she would like to add on oral terbinafine if there has been no improvements after 3 treatments.

## 2019-05-29 NOTE — Progress Notes (Signed)
Agree with CMA note and assessment -Dr. Kathie Rhodes

## 2019-06-03 ENCOUNTER — Encounter: Payer: Self-pay | Admitting: Sports Medicine

## 2019-06-03 ENCOUNTER — Ambulatory Visit (INDEPENDENT_AMBULATORY_CARE_PROVIDER_SITE_OTHER): Payer: BC Managed Care – PPO | Admitting: Sports Medicine

## 2019-06-03 ENCOUNTER — Other Ambulatory Visit: Payer: Self-pay

## 2019-06-03 DIAGNOSIS — B351 Tinea unguium: Secondary | ICD-10-CM

## 2019-06-03 DIAGNOSIS — L603 Nail dystrophy: Secondary | ICD-10-CM

## 2019-06-03 DIAGNOSIS — Z9889 Other specified postprocedural states: Secondary | ICD-10-CM

## 2019-06-03 DIAGNOSIS — Z89421 Acquired absence of other right toe(s): Secondary | ICD-10-CM

## 2019-06-03 DIAGNOSIS — E1140 Type 2 diabetes mellitus with diabetic neuropathy, unspecified: Secondary | ICD-10-CM

## 2019-06-03 NOTE — Progress Notes (Signed)
Subjective: 59 year old diabetic male patient seen in office for follow-up evaluation patient is status post right fifth toe and distal metatarsal amputation for acute osteomyelitis on 03/14/2019.  Patient reports that he is doing good wound seems to be healed on the right foot and is in a normal shoe without any problems.  Reports that he went for his first laser treatment on last week and had a good experience patient denies nausea vomiting fever chills swelling redness or any other acute problems at this time.  Patient Active Problem List   Diagnosis Date Noted  . Cellulitis of right foot   . S/P foot surgery, right   . MSSA bacteremia   . Acute sepsis (HCC) 03/13/2019  . Osteomyelitis of fifth toe of right foot (HCC) 03/13/2019  . DKA (diabetic ketoacidosis) (HCC) 03/13/2019  . Acute osteomyelitis of toe of right foot (HCC)   . AKI (acute kidney injury) (HCC)   . Demand ischemia (HCC)   . Hyperglycemia   . Laryngopharyngeal reflux (LPR) 02/07/2016  . GERD (gastroesophageal reflux disease) 12/27/2015  . Arthritis 11/16/2015  . Diabetes mellitus (HCC) 11/16/2015  . Hyperlipidemia 11/16/2015  . Localized edema 11/16/2015  . OBESITY 05/15/2007  . ADD 05/15/2007  . Obesity, diabetes, and hypertension syndrome (HCC) 05/15/2007  . Allergic rhinitis 05/15/2007  . Obstructive sleep apnea 05/15/2007  . COUGH 05/15/2007    Objective General: No acute distress  Right Lower extremity: At amputation wound site, wound is healed, no erythema, no edema, no drainage.  No other acute signs of infection. No calf pain. Range of motion excluding surgical site within normal limits with no pain or crepitation. S/p 5th partial ray amputation.  Left lower extremity, no acute concerns.  Nails x9 are well manicured patient is going for laser nail treatment on a monthly basis with last treatment performed on last week.    Problem List Items Addressed This Visit      Endocrine   Diabetes mellitus (HCC)      Other   S/P foot surgery, right    Other Visit Diagnoses    Nail dystrophy    -  Primary   Onychomycosis       History of amputation of lesser toe of right foot (HCC)         -Patient seen and evaluatued -Right foot amputation site wound has completely healed -May continue with good supportive shoes that do not rub and irritate the right foot encourage patient that if he goes to the beach to use water shoes and to avoid any type of barefoot walking also advised patient to frequently check his feet to make sure there is no rubbing or irritation and to always wear good supportive shoes with his diabetic insoles and socks -Continue with laser treatment for his nails as scheduled -Return to office in 4 weeks for final x-rays and wound check on the right.

## 2019-06-10 ENCOUNTER — Ambulatory Visit: Payer: BC Managed Care – PPO | Admitting: Sports Medicine

## 2019-06-12 DIAGNOSIS — G4733 Obstructive sleep apnea (adult) (pediatric): Secondary | ICD-10-CM | POA: Diagnosis not present

## 2019-06-12 DIAGNOSIS — Z1159 Encounter for screening for other viral diseases: Secondary | ICD-10-CM | POA: Diagnosis not present

## 2019-06-17 DIAGNOSIS — K640 First degree hemorrhoids: Secondary | ICD-10-CM | POA: Diagnosis not present

## 2019-06-17 DIAGNOSIS — Z1211 Encounter for screening for malignant neoplasm of colon: Secondary | ICD-10-CM | POA: Diagnosis not present

## 2019-06-17 DIAGNOSIS — K5730 Diverticulosis of large intestine without perforation or abscess without bleeding: Secondary | ICD-10-CM | POA: Diagnosis not present

## 2019-06-26 ENCOUNTER — Ambulatory Visit (INDEPENDENT_AMBULATORY_CARE_PROVIDER_SITE_OTHER): Payer: BC Managed Care – PPO | Admitting: Sports Medicine

## 2019-06-26 ENCOUNTER — Other Ambulatory Visit: Payer: Self-pay

## 2019-06-26 DIAGNOSIS — B351 Tinea unguium: Secondary | ICD-10-CM

## 2019-06-26 DIAGNOSIS — L603 Nail dystrophy: Secondary | ICD-10-CM

## 2019-06-26 NOTE — Progress Notes (Signed)
Patient presents today for the 2nd laser treatment. Diagnosed with mycotic nail infection by Dr. Marylene Land.   Toenail most affected are hallux nails bilateral and 2nd and 3rd right. There is not a lot of nail growth just yet, but he is happy with the appearance of the nails, just with me trimming and filing them.  All other systems are negative.  Nails were filed thin. Laser therapy was administered to 1st bilateral and 2nd and 3rd nails right and patient tolerated the treatment well. All safety precautions were in place.    Follow up in 4 weeks for laser # 3.   Per Dr. Wynema Birch note-she would like to add on oral terbinafine if there has been no improvements after 3 treatments.

## 2019-07-02 ENCOUNTER — Encounter: Payer: Self-pay | Admitting: Sports Medicine

## 2019-07-02 ENCOUNTER — Ambulatory Visit (INDEPENDENT_AMBULATORY_CARE_PROVIDER_SITE_OTHER): Payer: BC Managed Care – PPO

## 2019-07-02 ENCOUNTER — Other Ambulatory Visit: Payer: Self-pay | Admitting: Sports Medicine

## 2019-07-02 ENCOUNTER — Other Ambulatory Visit: Payer: Self-pay

## 2019-07-02 ENCOUNTER — Ambulatory Visit (INDEPENDENT_AMBULATORY_CARE_PROVIDER_SITE_OTHER): Payer: BC Managed Care – PPO | Admitting: Sports Medicine

## 2019-07-02 DIAGNOSIS — M25371 Other instability, right ankle: Secondary | ICD-10-CM | POA: Diagnosis not present

## 2019-07-02 DIAGNOSIS — M2042 Other hammer toe(s) (acquired), left foot: Secondary | ICD-10-CM

## 2019-07-02 DIAGNOSIS — B351 Tinea unguium: Secondary | ICD-10-CM

## 2019-07-02 DIAGNOSIS — M79671 Pain in right foot: Secondary | ICD-10-CM

## 2019-07-02 DIAGNOSIS — E1140 Type 2 diabetes mellitus with diabetic neuropathy, unspecified: Secondary | ICD-10-CM

## 2019-07-02 DIAGNOSIS — Z9889 Other specified postprocedural states: Secondary | ICD-10-CM

## 2019-07-02 DIAGNOSIS — Z89421 Acquired absence of other right toe(s): Secondary | ICD-10-CM

## 2019-07-02 NOTE — Progress Notes (Signed)
Subjective: 59 year old diabetic male patient seen in office for follow-up evaluation patient is status post right fifth toe and distal metatarsal amputation for acute osteomyelitis on 03/14/2019.  Patient reports that he is doing good but is concerned with rubbing now at left 2nd and the curving of these toes. No other issues noted.  FBS not recorded.  Patient Active Problem List   Diagnosis Date Noted  . Cellulitis of right foot   . S/P foot surgery, right   . MSSA bacteremia   . Acute sepsis ([place]) 03/13/2019  . Osteomyelitis of fifth toe of right foot ([place]) 03/13/2019  . DKA (diabetic ketoacidosis) ([place]) 03/13/2019  . Acute osteomyelitis of toe of right foot ([place])   . AKI (acute kidney injury) ([place])   . Demand ischemia ([place])   . Hyperglycemia   . Laryngopharyngeal reflux (LPR) 02/07/2016  . GERD (gastroesophageal reflux disease) 12/27/2015  . Arthritis 11/16/2015  . Diabetes mellitus ([place]) 11/16/2015  . Hyperlipidemia 11/16/2015  . Localized edema 11/16/2015  . OBESITY 05/15/2007  . ADD 05/15/2007  . Obesity, diabetes, and hypertension syndrome ([place]) 05/15/2007  . Allergic rhinitis 05/15/2007  . Obstructive sleep apnea 05/15/2007  . COUGH 05/15/2007    Objective General: No acute distress  Right Lower extremity: At amputation wound site, wound is healed, no erythema, no edema, no drainage.  No other acute signs of infection. No calf pain. Range of motion excluding surgical site within normal limits with no pain or crepitation. S/p 5th partial ray amputation.  Left lower extremity, rubbing at 2nd toe, hammertoe, no signs of infection.  Nails x9 are well manicured patient is going for laser nail treatment on a monthly basis with last treatment performed on last week.  X-rays, consistent with amputation status, healing pathologic stress fracture at 4th met, hammertoe implants,    Problem List Items Addressed This Visit      Endocrine   Diabetes mellitus ([place])     Other    S/P foot surgery, right - Primary    Other Visit Diagnoses    History of amputation of lesser toe of right foot ([place])       Onychomycosis       Acquired hammertoe of left foot         -Patient seen and evaluated -Xrays reviewed  -Right foot amputation site healed -Recommend continue with good supportive shoes daily for foot type -Continue with laser treatment for his nails as scheduled -Recommend for left hammertoes prevent rubbing when in shoes to prevent 2nd toe from ulcerating -Return to office in 4 weeks for left foot x-rays and discuss of possible need for surgery on the left.

## 2019-07-07 ENCOUNTER — Telehealth: Payer: Self-pay | Admitting: Urology

## 2019-07-07 DIAGNOSIS — I10 Essential (primary) hypertension: Secondary | ICD-10-CM | POA: Diagnosis not present

## 2019-07-07 DIAGNOSIS — I2510 Atherosclerotic heart disease of native coronary artery without angina pectoris: Secondary | ICD-10-CM | POA: Diagnosis not present

## 2019-07-07 DIAGNOSIS — G4733 Obstructive sleep apnea (adult) (pediatric): Secondary | ICD-10-CM | POA: Diagnosis not present

## 2019-07-07 DIAGNOSIS — E1140 Type 2 diabetes mellitus with diabetic neuropathy, unspecified: Secondary | ICD-10-CM | POA: Diagnosis not present

## 2019-07-07 DIAGNOSIS — E559 Vitamin D deficiency, unspecified: Secondary | ICD-10-CM | POA: Diagnosis not present

## 2019-07-07 DIAGNOSIS — E11621 Type 2 diabetes mellitus with foot ulcer: Secondary | ICD-10-CM | POA: Diagnosis not present

## 2019-07-07 DIAGNOSIS — Z125 Encounter for screening for malignant neoplasm of prostate: Secondary | ICD-10-CM | POA: Diagnosis not present

## 2019-07-07 DIAGNOSIS — E782 Mixed hyperlipidemia: Secondary | ICD-10-CM | POA: Diagnosis not present

## 2019-07-07 DIAGNOSIS — F331 Major depressive disorder, recurrent, moderate: Secondary | ICD-10-CM | POA: Diagnosis not present

## 2019-07-07 NOTE — Telephone Encounter (Signed)
Recommend rest, gentle stretching and topical pain cream over the counter to rub on Shins. Typically this happens because the muscles are tight. He may benefit from changing to a different style shoe -Dr. Kathie Rhodes

## 2019-07-07 NOTE — Telephone Encounter (Signed)
Pt Left foot, walking on the outside of his foot and getting chin splints,wants to know if he needs to come see you or what you think he should do. Please advise, Thanks!

## 2019-07-08 ENCOUNTER — Other Ambulatory Visit: Payer: Self-pay | Admitting: Sports Medicine

## 2019-07-08 DIAGNOSIS — Z9889 Other specified postprocedural states: Secondary | ICD-10-CM

## 2019-07-09 ENCOUNTER — Other Ambulatory Visit: Payer: Self-pay | Admitting: Sports Medicine

## 2019-07-09 DIAGNOSIS — Z9889 Other specified postprocedural states: Secondary | ICD-10-CM

## 2019-07-22 ENCOUNTER — Other Ambulatory Visit: Payer: Self-pay | Admitting: Sports Medicine

## 2019-07-22 DIAGNOSIS — M722 Plantar fascial fibromatosis: Secondary | ICD-10-CM

## 2019-07-23 ENCOUNTER — Ambulatory Visit (INDEPENDENT_AMBULATORY_CARE_PROVIDER_SITE_OTHER): Payer: BC Managed Care – PPO | Admitting: Sports Medicine

## 2019-07-23 ENCOUNTER — Other Ambulatory Visit: Payer: Self-pay

## 2019-07-23 ENCOUNTER — Other Ambulatory Visit: Payer: Self-pay | Admitting: Sports Medicine

## 2019-07-23 ENCOUNTER — Ambulatory Visit (INDEPENDENT_AMBULATORY_CARE_PROVIDER_SITE_OTHER): Payer: BC Managed Care – PPO

## 2019-07-23 ENCOUNTER — Encounter: Payer: Self-pay | Admitting: Sports Medicine

## 2019-07-23 DIAGNOSIS — T148XXA Other injury of unspecified body region, initial encounter: Secondary | ICD-10-CM

## 2019-07-23 DIAGNOSIS — M779 Enthesopathy, unspecified: Secondary | ICD-10-CM

## 2019-07-23 DIAGNOSIS — M14679 Charcot's joint, unspecified ankle and foot: Secondary | ICD-10-CM | POA: Diagnosis not present

## 2019-07-23 DIAGNOSIS — M14672 Charcot's joint, left ankle and foot: Secondary | ICD-10-CM

## 2019-07-23 DIAGNOSIS — M25372 Other instability, left ankle: Secondary | ICD-10-CM

## 2019-07-23 DIAGNOSIS — Z9889 Other specified postprocedural states: Secondary | ICD-10-CM

## 2019-07-23 DIAGNOSIS — M2042 Other hammer toe(s) (acquired), left foot: Secondary | ICD-10-CM

## 2019-07-23 DIAGNOSIS — B351 Tinea unguium: Secondary | ICD-10-CM

## 2019-07-23 DIAGNOSIS — Z89421 Acquired absence of other right toe(s): Secondary | ICD-10-CM | POA: Diagnosis not present

## 2019-07-23 DIAGNOSIS — E1140 Type 2 diabetes mellitus with diabetic neuropathy, unspecified: Secondary | ICD-10-CM

## 2019-07-23 NOTE — Progress Notes (Signed)
Subjective: 59 year old diabetic male patient seen in office for follow-up evaluation patient is status post right fifth toe and distal metatarsal amputation for acute osteomyelitis on 03/14/2019.  Patient reports that he is doing good but his shoes caused rubbing to back of right heel, and reports that he still gets some rolling out on left ankle and when he was walking for exercise and felt some shin splints but now is better. No other issues noted.  FBS 162 and last A1c 6.9  Patient Active Problem List   Diagnosis Date Noted  . Cellulitis of right foot   . S/P foot surgery, right   . MSSA bacteremia   . Acute sepsis (HCC) 03/13/2019  . Osteomyelitis of fifth toe of right foot (HCC) 03/13/2019  . DKA (diabetic ketoacidosis) (HCC) 03/13/2019  . Acute osteomyelitis of toe of right foot (HCC)   . AKI (acute kidney injury) (HCC)   . Demand ischemia (HCC)   . Hyperglycemia   . Laryngopharyngeal reflux (LPR) 02/07/2016  . GERD (gastroesophageal reflux disease) 12/27/2015  . Arthritis 11/16/2015  . Diabetes mellitus (HCC) 11/16/2015  . Hyperlipidemia 11/16/2015  . Localized edema 11/16/2015  . OBESITY 05/15/2007  . ADD 05/15/2007  . Obesity, diabetes, and hypertension syndrome (HCC) 05/15/2007  . Allergic rhinitis 05/15/2007  . Obstructive sleep apnea 05/15/2007  . COUGH 05/15/2007    Objective General: No acute distress  Right Lower extremity: Amp wound site healed on right, no erythema, no edema, no drainage. Abrasion to right posterior heel,  No other acute signs of infection. No calf pain. Range of motion excluding surgical site within normal limits with no pain or crepitation. S/p 5th partial ray amputation.  Left lower extremity, varus foot deformity on left with charcot, hammertoe, no signs of infection.  Nails x9 are well manicured patient is going for laser nail treatment on a monthly basis with last treatment performed on last week.  X-rays, Left ankle charcot changes at  midfoot, no fracture.    Problem List Items Addressed This Visit      Endocrine   Diabetes mellitus (HCC)     Other   S/P foot surgery, right    Other Visit Diagnoses    Ankle instability, left    -  Primary   Charcot's joint of foot, unspecified laterality       History of amputation of lesser toe of right foot (HCC)       Onychomycosis       Abrasion       right heel      -Patient seen and evaluated -Xrays reviewed  -Right foot amputation site healed -Recommend continue with good supportive diabetic shoes as long as there is no rubbing -Recommend patient to see Betha for bracing option on left -Continue with laser for nails -Return to office as scheduled or sooner if problems arise.

## 2019-07-24 ENCOUNTER — Ambulatory Visit (INDEPENDENT_AMBULATORY_CARE_PROVIDER_SITE_OTHER): Payer: BC Managed Care – PPO | Admitting: *Deleted

## 2019-07-24 DIAGNOSIS — B351 Tinea unguium: Secondary | ICD-10-CM

## 2019-07-24 NOTE — Progress Notes (Signed)
Patient presents today for the 3rd laser treatment. Diagnosed with mycotic nail infection by Dr. Marylene Land.   Toenail most affected are hallux nails bilateral and 2nd and 3rd right. The treated nails are looking better with the 3rd right almost completely clear.  All other systems are negative.  Nails were filed thin. Laser therapy was administered to 1st bilateral and 2nd and 3rd nails right and patient tolerated the treatment well. All safety precautions were in place.    Follow up in 4 weeks for laser # 4.  Per Dr. Wynema Birch note-she would like to add on oral terbinafine if there has been no improvements after 3 treatments.  ~Take pic next visit and send to Dr. Marylene Land for review to see if she would like to put him on terbinafine.

## 2019-07-27 ENCOUNTER — Other Ambulatory Visit: Payer: Self-pay

## 2019-07-27 ENCOUNTER — Ambulatory Visit (INDEPENDENT_AMBULATORY_CARE_PROVIDER_SITE_OTHER): Payer: BC Managed Care – PPO | Admitting: Ophthalmology

## 2019-07-27 ENCOUNTER — Encounter (INDEPENDENT_AMBULATORY_CARE_PROVIDER_SITE_OTHER): Payer: Self-pay | Admitting: Ophthalmology

## 2019-07-27 DIAGNOSIS — E113412 Type 2 diabetes mellitus with severe nonproliferative diabetic retinopathy with macular edema, left eye: Secondary | ICD-10-CM | POA: Diagnosis not present

## 2019-07-27 DIAGNOSIS — H35033 Hypertensive retinopathy, bilateral: Secondary | ICD-10-CM | POA: Insufficient documentation

## 2019-07-27 DIAGNOSIS — E113411 Type 2 diabetes mellitus with severe nonproliferative diabetic retinopathy with macular edema, right eye: Secondary | ICD-10-CM

## 2019-07-27 MED ORDER — FLUORESCEIN SODIUM 10 % IV SOLN
500.0000 mg | INTRAVENOUS | Status: AC | PRN
  Administered 2019-07-27: 500 mg via INTRAVENOUS

## 2019-07-27 NOTE — Progress Notes (Signed)
07/27/2019     CHIEF COMPLAINT Patient presents for Retina Evaluation and Diabetic Retinopathy with Macular Edema   HISTORY OF PRESENT ILLNESS: [name] is a 59 y.o. male who presents to the clinic today for:   HPI    Retina Evaluation    In both eyes.       Last edited by Hurman Horn, MD on 07/27/2019  8:20 AM. (History)      Referring physician: Josetta Huddle, MD [address] [place],  [place] [address]  HISTORICAL INFORMATION:   Selected notes from the MEDICAL RECORD NUMBER    Lab Results  Component Value Date   HGBA1C 8.8 (H) 03/13/2019     CURRENT MEDICATIONS: No current outpatient medications on file. (Ophthalmic Drugs)   No current facility-administered medications for this visit. (Ophthalmic Drugs)   Current Outpatient Medications (Other)  Medication Sig  . amLODipine (NORVASC) 5 MG tablet Take 5 mg by mouth daily.  Marland Kitchen aspirin EC 81 MG tablet Take 81 mg by mouth daily.  . BD INSULIN SYRINGE U/F 31G X 5/16" 1 ML MISC See admin instructions.  . cephALEXin (KEFLEX) 500 MG capsule Take 1 capsule (500 mg total) by mouth 4 (four) times daily. (Patient not taking: Reported on 05/18/2019)  . cholecalciferol (VITAMIN D3) 25 MCG (1000 UNIT) tablet Take 1,000 Units by mouth daily.  Marland Kitchen gabapentin (NEURONTIN) 300 MG capsule Take 1 capsule (300 mg total) by mouth at bedtime.  Marland Kitchen glimepiride (AMARYL) 4 MG tablet Take 4 mg by mouth daily with breakfast.   . insulin NPH-regular Human (NOVOLIN 70/30) (70-30) 100 UNIT/ML injection Inject 40-50 Units into the skin 2 (two) times daily with a meal.   . loratadine (CLARITIN) 10 MG tablet Take 10 mg by mouth daily.  . metFORMIN (GLUCOPHAGE-XR) 500 MG 24 hr tablet Take 1,000 mg by mouth 2 (two) times daily.   . montelukast (SINGULAIR) 10 MG tablet TAKE 1 TABLET BY MOUTH AT BEDTIME  . pantoprazole (PROTONIX) 40 MG tablet TAKE 1 TABLET BY MOUTH TWICE DAILY  . simvastatin (ZOCOR) 40 MG tablet Take 40 mg by mouth  every evening.   Marland Kitchen Spacer/Aero-Holding Chambers (AEROCHAMBER MV) inhaler Use as instructed  . vitamin B-12 (CYANOCOBALAMIN) 1000 MCG tablet Take 1,000 mcg by mouth daily.   No current facility-administered medications for this visit. (Other)      REVIEW OF SYSTEMS:    ALLERGIES No Known Allergies  PAST MEDICAL HISTORY Past Medical History:  Diagnosis Date  . Diabetes ([place])   . Hyperlipidemia   . Hypertension   . NSTEMI (non-ST elevated myocardial infarction) ([place])   . Obesity   . Sleep apnea    Past Surgical History:  Procedure Laterality Date  . ACHILLES TENDON REPAIR Right   . AMPUTATION TOE Right 03/14/2019   Procedure: AMPUTATION TOE, 5th toe;  Surgeon: Landis Martins, DPM;  Location: [facility];  Service: Podiatry;  Laterality: Right;  . heart stent  2012   x1  . INCISION AND DRAINAGE Right 03/14/2019   Procedure: INCISION AND DRAINAGE;  Surgeon: Landis Martins, DPM;  Location: [facility];  Service: Podiatry;  Laterality: Right;  . SHOULDER ARTHROSCOPY Right   . TEE WITHOUT CARDIOVERSION N/A 03/17/2019   Procedure: TRANSESOPHAGEAL ECHOCARDIOGRAM (TEE);  Surgeon: Skeet Latch, MD;  Location: [facility];  Service: Cardiovascular;  Laterality: N/A;  . TOE SURGERY Right 06/2018   for a hammer toe  . UVULOPALATOPHARYNGOPLASTY      FAMILY HISTORY Family History  Problem Relation Age of Onset  . Cancer Other   . Diabetes Other   . Heart disease Other   . Breast cancer Mother   . Heart disease Father   . Diabetes Father   . Leukemia Brother   . Asthma Maternal Uncle     SOCIAL HISTORY Social History   Tobacco Use  . Smoking status: Passive Smoke Exposure - Never Smoker  . Smokeless tobacco: Never Used  . Tobacco comment: Both parents growing up.   Vaping Use  . Vaping Use: Never used  Substance Use Topics  . Alcohol use: Yes    Comment: 4-5 on weekends  . Drug use: No         OPHTHALMIC EXAM:  Base Eye Exam    Pupils      Pupils   Right PERRL    Left PERRL       Dilation    Both eyes: 2.5% Phenylephrine, 1.0% Mydriacyl, Paremyd @ 8:22 AM       Dilation #2    Both eyes: 1.0% Mydriacyl @ 8:22 AM        Slit Lamp and Fundus Exam    External Exam      Right Left   External Normal Normal       Slit Lamp Exam      Right Left   Lids/Lashes Normal Normal   Conjunctiva/Sclera White and quiet White and quiet   Cornea Clear Clear   Anterior Chamber Deep and quiet Deep and quiet   Iris Round and reactive Round and reactive   Lens 1+ Nuclear sclerosis 1+ Nuclear sclerosis   Anterior Vitreous Normal Normal          IMAGING AND PROCEDURES  Imaging and Procedures for 07/27/19  OCT, Retina - OU - Both Eyes       Right Eye Quality was good. Scan locations included subfoveal. Central Foveal Thickness: 311.   Left Eye Scan locations included subfoveal. Central Foveal Thickness: 320.   Notes Clinically significant MAC edema minor inferotemporal to the fovea OD  OS with 2 focal points of extra foveal CSME anterior to the fovea as well as temporal.  Will opt to treat this with limited focal laser treatment in the future to control this condition.  Some peripheral areas of capillary nonperfusion secondary to previous hypertensive retinopathy superimposed upon diabetic retinopathy, severe NPDR       Color Fundus Photography Optos - OU - Both Eyes       Right Eye Progression has improved. Disc findings include normal observations. Macula : microaneurysms.   Left Eye Progression has worsened. Disc findings include normal observations. Macula : microaneurysms.   Notes Evidence of residual cotton-wool spots para papillary posterior pole OU.  There are fewer of them compared to 2 about 3 months previous.  This suggest that hypertensive retinopathy is better controlled.  Nonetheless CSME persists.  OU       Fluorescein Angiography Optos (Transit OS)       Injection:  500 mg Fluorescein Sodium 10 % injection   NDC:  [phone]   Route: Intravenous, Site: Left ArmRight Eye   Progression has been stable. Early phase findings include leakage, microaneurysm. Mid/Late phase findings include leakage, vascular perfusion defect. Choroidal neovascularization is not present.   Left Eye   Progression has worsened. Early phase findings include leakage. Mid/Late phase findings include vascular perfusion defect, leakage. Choroidal neovascularization is not present.   Notes Clinically significant macular edema present OU, more pronounced  inferior and temporally OS.                  ASSESSMENT/PLAN:  No problem-specific Assessment & Plan notes found for this encounter.      ICD-10-CM   1. Severe nonproliferative diabetic retinopathy of left eye, with macular edema, associated with type 2 diabetes mellitus (HCC)  F68.1275 OCT, Retina - OU - Both Eyes    Color Fundus Photography Optos - OU - Both Eyes    Fluorescein Angiography Optos (Transit OS)    Fluorescein Sodium 10 % injection 500 mg  2. Severe nonproliferative diabetic retinopathy of right eye, with macular edema, associated with type 2 diabetes mellitus (HCC)  E11.3411 OCT, Retina - OU - Both Eyes    Color Fundus Photography Optos - OU - Both Eyes    Fluorescein Angiography Optos (Transit OS)    Fluorescein Sodium 10 % injection 500 mg  3. Hypertensive retinopathy of both eyes  H35.033     1.  Condition explained to the patient and will opt to treat with focal laser to diminish treatment burden long-term.  2.  3.  Ophthalmic Meds Ordered this visit:  Meds ordered this encounter  Medications  . Fluorescein Sodium 10 % injection 500 mg       Return in about 2 weeks (around 08/10/2019) for dilate, OS, FOCAL laser.  There are no Patient Instructions on file for this visit.   Explained the diagnoses, plan, and follow up with the patient and they expressed understanding.  Patient expressed understanding of the importance of proper  follow up care.   Clent Demark Rankin M.D. Diseases & Surgery of the Retina and Vitreous Retina & Diabetic [place] 07/27/19     Abbreviations: M myopia (nearsighted); A astigmatism; H hyperopia (farsighted); P presbyopia; Mrx spectacle prescription;  CTL contact lenses; OD right eye; OS left eye; OU both eyes  XT exotropia; ET esotropia; PEK punctate epithelial keratitis; PEE punctate epithelial erosions; DES dry eye syndrome; MGD meibomian gland dysfunction; ATs artificial tears; PFAT's preservative free artificial tears; [place] nuclear sclerotic cataract; PSC posterior subcapsular cataract; ERM epi-retinal membrane; PVD posterior vitreous detachment; RD retinal detachment; DM diabetes mellitus; DR diabetic retinopathy; NPDR non-proliferative diabetic retinopathy; PDR proliferative diabetic retinopathy; CSME clinically significant macular edema; DME diabetic macular edema; dbh dot blot hemorrhages; CWS cotton wool spot; POAG primary open angle glaucoma; C/D cup-to-disc ratio; HVF humphrey visual field; GVF goldmann visual field; OCT optical coherence tomography; IOP intraocular pressure; BRVO Branch retinal vein occlusion; CRVO central retinal vein occlusion; CRAO central retinal artery occlusion; BRAO branch retinal artery occlusion; RT retinal tear; SB scleral buckle; PPV pars plana vitrectomy; VH Vitreous hemorrhage; PRP panretinal laser photocoagulation; IVK intravitreal kenalog; VMT vitreomacular traction; MH Macular hole;  NVD neovascularization of the disc; NVE neovascularization elsewhere; AREDS age related eye disease study; ARMD age related macular degeneration; POAG primary open angle glaucoma; EBMD epithelial/anterior basement membrane dystrophy; ACIOL anterior chamber intraocular lens; IOL intraocular lens; PCIOL posterior chamber intraocular lens; Phaco/IOL phacoemulsification with intraocular lens placement; [place] photorefractive keratectomy; LASIK laser assisted in situ keratomileusis; HTN  hypertension; DM diabetes mellitus; COPD chronic obstructive pulmonary disease

## 2019-08-07 ENCOUNTER — Other Ambulatory Visit: Payer: BC Managed Care – PPO | Admitting: Orthotics

## 2019-08-10 ENCOUNTER — Encounter (INDEPENDENT_AMBULATORY_CARE_PROVIDER_SITE_OTHER): Payer: BC Managed Care – PPO | Admitting: Ophthalmology

## 2019-08-12 DIAGNOSIS — G4733 Obstructive sleep apnea (adult) (pediatric): Secondary | ICD-10-CM | POA: Diagnosis not present

## 2019-08-13 DIAGNOSIS — G4733 Obstructive sleep apnea (adult) (pediatric): Secondary | ICD-10-CM | POA: Diagnosis not present

## 2019-08-18 ENCOUNTER — Encounter: Payer: Self-pay | Admitting: Pulmonary Disease

## 2019-08-18 ENCOUNTER — Other Ambulatory Visit: Payer: Self-pay

## 2019-08-18 ENCOUNTER — Ambulatory Visit (INDEPENDENT_AMBULATORY_CARE_PROVIDER_SITE_OTHER): Payer: BC Managed Care – PPO | Admitting: Pulmonary Disease

## 2019-08-18 DIAGNOSIS — G4733 Obstructive sleep apnea (adult) (pediatric): Secondary | ICD-10-CM | POA: Diagnosis not present

## 2019-08-18 DIAGNOSIS — R059 Cough, unspecified: Secondary | ICD-10-CM

## 2019-08-18 DIAGNOSIS — R05 Cough: Secondary | ICD-10-CM | POA: Diagnosis not present

## 2019-08-18 NOTE — Assessment & Plan Note (Signed)
Weight loss encouraged , referral to weight loss program

## 2019-08-18 NOTE — Assessment & Plan Note (Signed)
cough is likely a combination of postnasal drip and reflux Currently in remission  Our plan for a cough exacerbation would include -Intensifying postnasal drip regimen , including adding nasal steroids and decongestant -Double up Protonix and nonpharmacological measures for reflux including small portions and head elevation during sleep  -Further investigation only if no response to these measures

## 2019-08-18 NOTE — Patient Instructions (Signed)
  Your cough is likely a combination of postnasal drip and reflux  Our plan for a cough exacerbation would include -Intensifying postnasal drip regimen , including adding nasal steroids and decongestant -Double up Protonix and nonpharmacological measures for reflux including small portions and head elevation during sleep  -Further investigation only if no response to these measures  Weight loss encouraged , referral to weight loss program

## 2019-08-18 NOTE — Assessment & Plan Note (Signed)
Managed by [facility] 

## 2019-08-18 NOTE — Progress Notes (Signed)
Subjective:    Patient ID: [name], male    DOB: [birth year], 59 y.o.   MRN: [id number]  HPI  59 year old never smoker presents for evaluation of chronic seasonal cough previously attributed to seasonal allergies and GERD  Reports a cough for at least the past 5 years, currently in remission.  But he has just had a hospitalization for diabetic foot complicated by sepsis and MSSA bacteremia requiring a right toe amputation.  He has met his deductible and would like to get evaluated before he loses his insurance. He has been unable to lose weight currently 320 pounds. When cough occurs it will last for a few months, no relief with over-the-counter medications.  I reviewed previous evaluation by my partner Dr. Creig Hines in 2017  Cough variant asthma: Symbicort Breo and Ultram without effect.   Chronic allergic rhinitis: Previously evaluated by ENT without reported abnormality. He is using Loratadine & Singulair.   GERD: Small sliding-type hiatal hernia on esophagram.  Dilated esophagus in [place] x3-4, last around 2005, on Protonix  OSA:  S/P UPPP surgery.  Compliant with CPAP, managed by [facility]  Right middle lobe nodule: 4 mm  initially seen on CT imaging in October 2017.  Patient declined further imaging  Significant tests/ events reviewed  PFT 12/27/15:FVC 4.18 L (81%) FEV1 3.30 L (83%) ratio 79  negative bronchodilator response TLC 6.16 L (92%) RV 110% ERV 19% DLCO uncorrected 107%  IMAGING BARIUM SWALLOW/ESOPHAGRAM 11/2015 :Small sliding-type hiatal hernia with prominent spontaneous gastroesophageal reflux to the thoracic inlet. Barium tablet passed easily without delay.  CT CHEST W/O 11/2015 :4 mm nodule noted within right middle lobe.   IgE: 116 RAST Panel: [place] Grass 4.12 / Common Ragweed 6.86 / Dog Dander 6.35 / Multiple other positives ANA: Negative Anti-CCP: <16 RF: <14   Past Medical History:  Diagnosis Date  . Diabetes ([facility])   .  Hyperlipidemia   . Hypertension   . NSTEMI (non-ST elevated myocardial infarction) ([facility])   . Obesity   . Sleep apnea      Past Surgical History:  Procedure Laterality Date  . ACHILLES TENDON REPAIR Right   . AMPUTATION TOE Right 03/14/2019   Procedure: AMPUTATION TOE, 5th toe;  Surgeon: Landis Martins, DPM;  Location: [facility];  Service: Podiatry;  Laterality: Right;  . heart stent  2012   x1  . INCISION AND DRAINAGE Right 03/14/2019   Procedure: INCISION AND DRAINAGE;  Surgeon: Landis Martins, DPM;  Location: [facility];  Service: Podiatry;  Laterality: Right;  . SHOULDER ARTHROSCOPY Right   . TEE WITHOUT CARDIOVERSION N/A 03/17/2019   Procedure: TRANSESOPHAGEAL ECHOCARDIOGRAM (TEE);  Surgeon: Skeet Latch, MD;  Location: [facility];  Service: Cardiovascular;  Laterality: N/A;  . TOE SURGERY Right 06/2018   for a hammer toe  . UVULOPALATOPHARYNGOPLASTY      No Known Allergies  Social History   Socioeconomic History  . Marital status: Married    Spouse name: Not on file  . Number of children: Not on file  . Years of education: Not on file  . Highest education level: Not on file  Occupational History  . Not on file  Tobacco Use  . Smoking status: Passive Smoke Exposure - Never Smoker  . Smokeless tobacco: Never Used  . Tobacco comment: Both parents growing up.   Vaping Use  . Vaping Use: Never used  Substance and Sexual Activity  . Alcohol use: Yes    Comment: 4-5 on weekends  . Drug use:  No  . Sexual activity: Not on file  Other Topics Concern  . Not on file  Social History Narrative   Originally from [place], [place]. Previously has lived in [place]. 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 No pets currently. No bird or hot tub exposure. Could have mold in his current home and has lived there for 1.5 years now.    Social Determinants of Health   Financial Resource Strain:   . Difficulty of Paying Living Expenses:     Food Insecurity:   . Worried About Charity fundraiser in the Last Year:   . Arboriculturist in the Last Year:   Transportation Needs:   . Film/video editor (Medical):   Marland Kitchen Lack of Transportation (Non-Medical):   Physical Activity:   . Days of Exercise per Week:   . Minutes of Exercise per Session:   Stress:   . Feeling of Stress :   Social Connections:   . Frequency of Communication with Friends and Family:   . Frequency of Social Gatherings with Friends and Family:   . Attends Religious Services:   . Active Member of Clubs or Organizations:   . Attends Archivist Meetings:   Marland Kitchen Marital Status:   Intimate Partner Violence:   . Fear of Current or Ex-Partner:   . Emotionally Abused:   Marland Kitchen Physically Abused:   . Sexually Abused:      Family History  Problem Relation Age of Onset  . Cancer Other   . Diabetes Other   . Heart disease Other   . Breast cancer Mother   . Heart disease Father   . Diabetes Father   . Leukemia Brother   . Asthma Maternal Uncle       Review of Systems  Constitutional: negative for anorexia, fevers and sweats  Eyes: negative for irritation, redness and visual disturbance  Ears, nose, mouth, throat, and face: negative for earaches, epistaxis, nasal congestion and sore throat  Respiratory: negative for cough, dyspnea on exertion, sputum and wheezing  Cardiovascular: negative for chest pain, dyspnea, lower extremity edema, orthopnea, palpitations and syncope  Gastrointestinal: negative for abdominal pain, constipation, diarrhea, melena, nausea and vomiting  Genitourinary:negative for dysuria, frequency and hematuria  Hematologic/lymphatic: negative for bleeding, easy bruising and lymphadenopathy  Musculoskeletal:negative for arthralgias, muscle weakness and stiff joints  Neurological: negative for coordination problems, gait problems, headaches and weakness  Endocrine: + for diabetic symptoms including polydipsia, polyuria       Objective:   Physical Exam  Gen. Pleasant, obese, in no distress, normal affect ENT - no pallor,icterus, no post nasal drip, class 2-3 airway, s/p UPPP Neck: No JVD, no thyromegaly, no carotid bruits Lungs: no use of accessory muscles, no dullness to percussion, decreased without rales or rhonchi  Cardiovascular: Rhythm regular, heart sounds  normal, no murmurs or gallops, 1+ peripheral edema Abdomen: soft and non-tender, no hepatosplenomegaly, BS normal. Musculoskeletal: No deformities, no cyanosis or clubbing Neuro:  alert, non focal, no tremors       Assessment & Plan:

## 2019-08-19 ENCOUNTER — Ambulatory Visit (INDEPENDENT_AMBULATORY_CARE_PROVIDER_SITE_OTHER): Payer: BC Managed Care – PPO | Admitting: Ophthalmology

## 2019-08-19 ENCOUNTER — Encounter (INDEPENDENT_AMBULATORY_CARE_PROVIDER_SITE_OTHER): Payer: Self-pay | Admitting: Ophthalmology

## 2019-08-19 DIAGNOSIS — E113412 Type 2 diabetes mellitus with severe nonproliferative diabetic retinopathy with macular edema, left eye: Secondary | ICD-10-CM

## 2019-08-19 NOTE — Progress Notes (Signed)
08/19/2019     CHIEF COMPLAINT Patient presents for Retina Follow Up   HISTORY OF PRESENT ILLNESS: [name] is a 59 y.o. male who presents to the clinic today for:   HPI    Retina Follow Up    Patient presents with  Diabetic Retinopathy.  In left eye.  This started 3 weeks ago.  Severity is mild.  Duration of 3 weeks.  Since onset it is stable.          Comments    3 Week Focal Laser OS  Pt denies noticeable changes to [place] OU since last visit. Pt denies ocular pain, flashes of light, or floaters OU.  LBS: 146 this AM       Last edited by Ileana Roup, COA on 08/19/2019  8:12 AM. (History)      Referring physician: Marden Noble, MD [address] [place],  [place] [address]  HISTORICAL INFORMATION:   Selected notes from the MEDICAL RECORD NUMBER    Lab Results  Component Value Date   HGBA1C 8.8 (H) 03/13/2019     CURRENT MEDICATIONS: No current outpatient medications on file. (Ophthalmic Drugs)   No current facility-administered medications for this visit. (Ophthalmic Drugs)   Current Outpatient Medications (Other)  Medication Sig  . amLODipine (NORVASC) 5 MG tablet Take 5 mg by mouth daily.  Marland Kitchen aspirin EC 81 MG tablet Take 81 mg by mouth daily.  . BD INSULIN SYRINGE U/F 31G X 5/16" 1 ML MISC See admin instructions.  . cetirizine (ZYRTEC) 10 MG tablet Take 10 mg by mouth daily.  . cholecalciferol (VITAMIN D3) 25 MCG (1000 UNIT) tablet Take 1,000 Units by mouth daily.  . cloNIDine (CATAPRES) 0.1 MG tablet Take 0.1 mg by mouth daily.  Marland Kitchen gabapentin (NEURONTIN) 300 MG capsule Take 1 capsule (300 mg total) by mouth at bedtime.  Marland Kitchen glimepiride (AMARYL) 4 MG tablet Take 4 mg by mouth daily with breakfast.   . insulin NPH-regular Human (NOVOLIN 70/30) (70-30) 100 UNIT/ML injection Inject 40-50 Units into the skin 2 (two) times daily with a meal.   . loratadine (CLARITIN) 10 MG tablet Take 10 mg by mouth daily. (Patient not taking: Reported on 08/18/2019)   . metFORMIN (GLUCOPHAGE-XR) 500 MG 24 hr tablet Take 1,000 mg by mouth 2 (two) times daily.   . montelukast (SINGULAIR) 10 MG tablet TAKE 1 TABLET BY MOUTH AT BEDTIME  . pantoprazole (PROTONIX) 40 MG tablet TAKE 1 TABLET BY MOUTH TWICE DAILY  . simvastatin (ZOCOR) 40 MG tablet Take 40 mg by mouth every evening.   Marland Kitchen Spacer/Aero-Holding Chambers (AEROCHAMBER MV) inhaler Use as instructed (Patient taking differently: as needed. Use as instructed for cough)  . vitamin B-12 (CYANOCOBALAMIN) 1000 MCG tablet Take 1,000 mcg by mouth daily.   No current facility-administered medications for this visit. (Other)      REVIEW OF SYSTEMS:    ALLERGIES No Known Allergies  PAST MEDICAL HISTORY Past Medical History:  Diagnosis Date  . Diabetes (HCC)   . Hyperlipidemia   . Hypertension   . NSTEMI (non-ST elevated myocardial infarction) (HCC)   . Obesity   . Sleep apnea    Past Surgical History:  Procedure Laterality Date  . ACHILLES TENDON REPAIR Right   . AMPUTATION TOE Right 03/14/2019   Procedure: AMPUTATION TOE, 5th toe;  Surgeon: [name], DPM;  Location: [facility] OR;  Service: Podiatry;  Laterality: Right;  . heart stent  2012   x1  .  INCISION AND DRAINAGE Right 03/14/2019   Procedure: INCISION AND DRAINAGE;  Surgeon: [name], DPM;  Location: [facility] OR;  Service: Podiatry;  Laterality: Right;  . SHOULDER ARTHROSCOPY Right   . TEE WITHOUT CARDIOVERSION N/A 03/17/2019   Procedure: TRANSESOPHAGEAL ECHOCARDIOGRAM (TEE);  Surgeon: Chilton Si, MD;  Location: [facility] ENDOSCOPY;  Service: Cardiovascular;  Laterality: N/A;  . TOE SURGERY Right 06/2018   for a hammer toe  . UVULOPALATOPHARYNGOPLASTY      FAMILY HISTORY Family History  Problem Relation Age of Onset  . Cancer Other   . Diabetes Other   . Heart disease Other   . Breast cancer Mother   . Heart disease Father   . Diabetes Father   . Leukemia Brother   . Asthma Maternal Uncle     SOCIAL HISTORY Social History    Tobacco Use  . Smoking status: Passive Smoke Exposure - Never Smoker  . Smokeless tobacco: Never Used  . Tobacco comment: Both parents growing up.   Vaping Use  . Vaping Use: Never used  Substance Use Topics  . Alcohol use: Yes    Comment: 4-5 on weekends  . Drug use: No         OPHTHALMIC EXAM:  Base Eye Exam    Visual Acuity (ETDRS)      Right Left   Dist cc 20/20 -1 20/20 -1   Correction: Glasses       Tonometry (Tonopen, 8:19 AM)      Right Left   Pressure 19 19       Pupils      Pupils Dark Light Shape React APD   Right PERRL 4 3 Round Brisk None   Left PERRL 4 3 Round Brisk None       Visual Fields (Counting fingers)      Left Right    Full Full       Extraocular Movement      Right Left    Full Full       Neuro/Psych    Oriented x3: Yes   Mood/Affect: Normal       Dilation    Left eye: 1.0% Mydriacyl, 2.5% Phenylephrine @ 8:19 AM        Slit Lamp and Fundus Exam    External Exam      Right Left   External Normal Normal       Slit Lamp Exam      Right Left   Lids/Lashes Normal Normal   Conjunctiva/Sclera White and quiet White and quiet   Cornea Clear Clear   Anterior Chamber Deep and quiet Deep and quiet   Iris Round and reactive Round and reactive   Lens 1+ Nuclear sclerosis 1+ Nuclear sclerosis   Anterior Vitreous Normal Normal          IMAGING AND PROCEDURES  Imaging and Procedures for 08/19/19  OCT, Retina - OU - Both Eyes       Right Eye Quality was good. Scan locations included subfoveal. Central Foveal Thickness: 317. Progression has been stable. Findings include abnormal foveal contour.   Left Eye Quality was good. Scan locations included subfoveal. Central Foveal Thickness: 320. Progression has been stable.   Notes CSME focal temporally OS and inferonasally.       Focal Laser - OS - Left Eye       Time Out Confirmed correct patient, procedure, site, and patient consented.   Anesthesia Topical  anesthesia was used. Anesthetic medications included Proparacaine 0.5%.  Laser Information The type of laser was diode. Color was yellow. The duration in seconds was 0.07. The spot size was 100 microns. Laser power was 70. Total spots was 58.   Post-op The patient tolerated the procedure well. There were no complications. The patient received written and verbal post procedure care education.                 ASSESSMENT/PLAN:  No problem-specific Assessment & Plan notes found for this encounter.      ICD-10-CM   1. Severe nonproliferative diabetic retinopathy of left eye, with macular edema, associated with type 2 diabetes mellitus (HCC)  E11.3412 OCT, Retina - OU - Both Eyes    Focal Laser - OS - Left Eye    1.  2.  3.  Ophthalmic Meds Ordered this visit:  No orders of the defined types were placed in this encounter.      Return in about 4 months (around 12/20/2019) for DILATE OU, OCT.  There are no Patient Instructions on file for this visit.   Explained the diagnoses, plan, and follow up with the patient and they expressed understanding.  Patient expressed understanding of the importance of proper follow up care.   Alford Highland Tiny Chaudhary M.D. Diseases & Surgery of the Retina and Vitreous [facility] 08/19/19     Abbreviations: M myopia (nearsighted); A astigmatism; H hyperopia (farsighted); P presbyopia; Mrx spectacle prescription;  CTL contact lenses; OD right eye; OS left eye; OU both eyes  XT exotropia; ET esotropia; PEK punctate epithelial keratitis; PEE punctate epithelial erosions; DES dry eye syndrome; MGD meibomian gland dysfunction; ATs artificial tears; PFAT's preservative free artificial tears; NSC nuclear sclerotic cataract; PSC posterior subcapsular cataract; ERM epi-retinal membrane; PVD posterior vitreous detachment; RD retinal detachment; DM diabetes mellitus; DR diabetic retinopathy; NPDR non-proliferative diabetic retinopathy; PDR  proliferative diabetic retinopathy; CSME clinically significant macular edema; DME diabetic macular edema; dbh dot blot hemorrhages; CWS cotton wool spot; POAG primary open angle glaucoma; C/D cup-to-disc ratio; HVF humphrey visual field; GVF goldmann visual field; OCT optical coherence tomography; IOP intraocular pressure; BRVO Branch retinal vein occlusion; CRVO central retinal vein occlusion; CRAO central retinal artery occlusion; BRAO branch retinal artery occlusion; RT retinal tear; SB scleral buckle; PPV pars plana vitrectomy; VH Vitreous hemorrhage; PRP panretinal laser photocoagulation; IVK intravitreal kenalog; VMT vitreomacular traction; MH Macular hole;  NVD neovascularization of the disc; NVE neovascularization elsewhere; AREDS age related eye disease study; ARMD age related macular degeneration; POAG primary open angle glaucoma; EBMD epithelial/anterior basement membrane dystrophy; ACIOL anterior chamber intraocular lens; IOL intraocular lens; PCIOL posterior chamber intraocular lens; Phaco/IOL phacoemulsification with intraocular lens placement; PRK photorefractive keratectomy; LASIK laser assisted in situ keratomileusis; HTN hypertension; DM diabetes mellitus; COPD chronic obstructive pulmonary disease

## 2019-08-20 DIAGNOSIS — N5201 Erectile dysfunction due to arterial insufficiency: Secondary | ICD-10-CM | POA: Diagnosis not present

## 2019-09-01 ENCOUNTER — Encounter (INDEPENDENT_AMBULATORY_CARE_PROVIDER_SITE_OTHER): Payer: Self-pay | Admitting: Family Medicine

## 2019-09-01 ENCOUNTER — Other Ambulatory Visit: Payer: Self-pay

## 2019-09-01 ENCOUNTER — Ambulatory Visit (INDEPENDENT_AMBULATORY_CARE_PROVIDER_SITE_OTHER): Payer: BC Managed Care – PPO | Admitting: Family Medicine

## 2019-09-01 VITALS — BP 138/74 | HR 78 | Temp 97.7°F | Ht 71.0 in | Wt 310.0 lb

## 2019-09-01 DIAGNOSIS — Z9989 Dependence on other enabling machines and devices: Secondary | ICD-10-CM

## 2019-09-01 DIAGNOSIS — R0602 Shortness of breath: Secondary | ICD-10-CM

## 2019-09-01 DIAGNOSIS — E1169 Type 2 diabetes mellitus with other specified complication: Secondary | ICD-10-CM

## 2019-09-01 DIAGNOSIS — G4733 Obstructive sleep apnea (adult) (pediatric): Secondary | ICD-10-CM

## 2019-09-01 DIAGNOSIS — I10 Essential (primary) hypertension: Secondary | ICD-10-CM

## 2019-09-01 DIAGNOSIS — E1159 Type 2 diabetes mellitus with other circulatory complications: Secondary | ICD-10-CM | POA: Diagnosis not present

## 2019-09-01 DIAGNOSIS — E785 Hyperlipidemia, unspecified: Secondary | ICD-10-CM

## 2019-09-01 DIAGNOSIS — E119 Type 2 diabetes mellitus without complications: Secondary | ICD-10-CM

## 2019-09-01 DIAGNOSIS — E538 Deficiency of other specified B group vitamins: Secondary | ICD-10-CM

## 2019-09-01 DIAGNOSIS — Z9189 Other specified personal risk factors, not elsewhere classified: Secondary | ICD-10-CM

## 2019-09-01 DIAGNOSIS — F3289 Other specified depressive episodes: Secondary | ICD-10-CM | POA: Diagnosis not present

## 2019-09-01 DIAGNOSIS — R5383 Other fatigue: Secondary | ICD-10-CM

## 2019-09-01 DIAGNOSIS — Z0289 Encounter for other administrative examinations: Secondary | ICD-10-CM

## 2019-09-01 DIAGNOSIS — I252 Old myocardial infarction: Secondary | ICD-10-CM | POA: Diagnosis not present

## 2019-09-01 DIAGNOSIS — I152 Hypertension secondary to endocrine disorders: Secondary | ICD-10-CM

## 2019-09-01 DIAGNOSIS — Z6841 Body Mass Index (BMI) 40.0 and over, adult: Secondary | ICD-10-CM

## 2019-09-01 NOTE — Progress Notes (Signed)
Dear Dr. Vassie Loll,   Thank you for referring James C Mcalexander to our clinic. The following note includes my evaluation and treatment recommendations.  Chief Complaint:   OBESITY [name] (MR# [id number]) is a 59 y.o. male who presents for evaluation and treatment of obesity and related comorbidities. Current BMI is Body mass index is 43.24 kg/m. [name] has been struggling with his weight for many years and has been unsuccessful in either losing weight, maintaining weight loss, or reaching his healthy weight goal.  [name] is currently in the action stage of change and ready to dedicate time achieving and maintaining a healthier weight. [name] is interested in becoming our patient and working on intensive lifestyle modifications including (but not limited to) diet and exercise for weight loss.  [name] lives alone and is currently unemployed (divorced).  His girlfriend, Amy, is very supportive.  He was previously on Atkin's and lost 70 pounds, but gained it all back.  He was referred by Dr. Vassie Loll of [facility].  His PCP is at [facility].  He sees Magazine features editor primarily (Cardiology, Endocrinology, PCP, allergist).  He is not currently seeing Cardiology or Endocrinology.  Diabetes and other conditions are managed by his PCP.  He tends to have cravings and snack more in the afternoons and evenings.  Beckem's habits were reviewed today and are as follows: his desired weight loss is 65 pounds, he has been heavy most of his life, he started gaining weight in 2010, his heaviest weight ever was 335 pounds, he craves chocolate and salty snacks, he snacks frequently in the evenings, he frequently makes poor food choices, he has problems with excessive hunger, he frequently eats larger portions than normal and he struggles with emotional eating.  Depression Screen Donterrius's Food and Mood (modified PHQ-9) score was 16.  Depression screen PHQ 2/9 09/01/2019  Decreased Interest 2  Down, Depressed,  Hopeless 1  PHQ - 2 Score 3  Altered sleeping 3  Tired, decreased energy 3  Change in appetite 1  Feeling bad or failure about yourself  1  Trouble concentrating 1  Moving slowly or fidgety/restless 3  Suicidal thoughts 1  PHQ-9 Score 16  Difficult doing work/chores Somewhat difficult   Subjective:   1. Other fatigue [name] admits to daytime somnolence and denies waking up still tired. Patent has a history of symptoms of daytime fatigue and snoring. Redmond generally gets 7 hours of sleep per night, and states that he has generally restful sleep. Snoring is present. Apneic episodes are not present. Epworth Sleepiness Score is 7.  2. Shortness of breath on exertion [name] notes increasing shortness of breath with exercising and seems to be worsening over time with weight gain. He notes getting out of breath sooner with activity than he used to. This has gotten worse recently. [name] denies shortness of breath at rest or orthopnea.  3. Type 2 diabetes mellitus without complication, without long-term current use of insulin (HCC) Medications reviewed. Diabetic ROS: no polyuria or polydipsia, no chest pain, dyspnea or TIA's, no numbness, tingling or pain in extremities.  A1c was 6.9 in April 2021.  Lab Results  Component Value Date   HGBA1C 8.8 (H) 03/13/2019   HGBA1C (H) 04/26/2010    11.9 (NOTE)  According to the ADA Clinical Practice Recommendations for 2011, when HbA1c is used as a screening test:   >=6.5%   Diagnostic of Diabetes Mellitus           (if abnormal result  is confirmed)  5.7-6.4%   Increased risk of developing Diabetes Mellitus  References:Diagnosis and Classification of Diabetes Mellitus,Diabetes Care,2011,34(Suppl 1):S62-S69 and Standards of Medical Care in         Diabetes - 2011,Diabetes Care,2011,34  (Suppl 1):S11-S61.   Lab Results  Component Value Date   [facility]  04/27/2010    88        Total  Cholesterol/HDL:CHD Risk Coronary Heart Disease Risk Table                     Men   Women  1/2 Average Risk   3.4   3.3  Average Risk       5.0   4.4  2 X Average Risk   9.6   7.1  3 X Average Risk  23.4   11.0        Use the calculated Patient Ratio above and the CHD Risk Table to determine the patient's CHD Risk.        ATP III CLASSIFICATION (LDL):  <100     mg/dL   Optimal  960-454  mg/dL   Near or Above                    Optimal  130-159  mg/dL   Borderline  098-119  mg/dL   High  >147     mg/dL   Very High   CREATININE 1.30 05/18/2019   4. H/O acute myocardial infarction [name] suffered an MI in 2012 and had one cardiac stent placed.  5. Hypertension associated with diabetes (HCC) Review: taking medications as instructed, no medication side effects noted, no chest pain on exertion, no dyspnea on exertion, no swelling of ankles.   BP Readings from Last 3 Encounters:  09/01/19 138/74  08/18/19 (!) 148/80  05/18/19 (!) 185/101   6. Hyperlipidemia associated with type 2 diabetes mellitus (HCC) [name] has hyperlipidemia and has been trying to improve his cholesterol levels with intensive lifestyle modification including a low saturated fat diet, exercise and weight loss. He denies any chest pain, claudication or myalgias.  Lab Results  Component Value Date   ALT 48 (H) 03/17/2019   AST 45 (H) 03/17/2019   ALKPHOS 159 (H) 03/17/2019   BILITOT 0.6 03/17/2019   Lab Results  Component Value Date   CHOL  04/27/2010    155        ATP III CLASSIFICATION:  <200     mg/dL   Desirable  829-562  mg/dL   Borderline High  >=130    mg/dL   High          HDL 38 (L) 04/27/2010   LDLCALC  04/27/2010    88        Total Cholesterol/HDL:CHD Risk Coronary Heart Disease Risk Table                     Men   Women  1/2 Average Risk   3.4   3.3  Average Risk       5.0   4.4  2 X Average Risk   9.6   7.1  3 X Average Risk  23.4   11.0        Use the calculated Patient  Ratio above and the CHD Risk Table to determine the patient's CHD Risk.        ATP III CLASSIFICATION (LDL):  <100     mg/dL   Optimal  161-096  mg/dL   Near or Above                    Optimal  130-159  mg/dL   Borderline  045-409  mg/dL   High  >811     mg/dL   Very High   TRIG 914 04/27/2010   CHOLHDL 4.1 04/27/2010   7. B12 deficiency He notes fatigue. He is not a vegetarian.  He does not have a previous diagnosis of pernicious anemia.  He does not have a history of weight loss surgery.   8. OSA on CPAP [name] has a diagnosis of sleep apnea. He reports that he is using a CPAP regularly.  Epworth sleepiness score is 7.  9. Other depression, with emotional eating [name] is struggling with emotional eating and using food for comfort to the extent that it is negatively impacting his health. He has been working on behavior modification techniques to help reduce his emotional eating and has been unsuccessful. He shows no sign of suicidal or homicidal ideations.  PHQ-9 is 16.  10. At risk for hypoglycemia [name] is at increased risk for hypoglycemia due to changes in diet, diagnosis of diabetes, and/or insulin use. [name] is currently taking insulin.   Assessment/Plan:   1. Other fatigue Azaryah does feel that his weight is causing his energy to be lower than it should be. Fatigue may be related to obesity, depression or many other causes. Labs will be ordered, and in the meanwhile, [name] will focus on self care including making healthy food choices, increasing physical activity and focusing on stress reduction. - EKG 12-Lead - CBC with Differential/Platelet - VITAMIN D 25 Hydroxy (Vit-D Deficiency, Fractures) - Vitamin B12 - Folate - T3, free - T4, free - TSH  2. Shortness of breath on exertion Dardan does feel that he gets out of breath more easily that he used to when he exercises. [name]'s shortness of breath appears to be obesity related and exercise induced. He has agreed  to work on weight loss and gradually increase exercise to treat his exercise induced shortness of breath. Will continue to monitor closely.  3. Type 2 diabetes mellitus without complication, without long-term current use of insulin (HCC) Good blood sugar control is important to decrease the likelihood of diabetic complications such as nephropathy, neuropathy, limb loss, blindness, coronary artery disease, and death. Intensive lifestyle modification including diet, exercise and weight loss are the first line of treatment for diabetes.  - Hemoglobin A1c - Insulin, random  4. H/O acute myocardial infarction Followed by Cardiology for this problem. Those encounter notes were reviewed.  5. Hypertension associated with diabetes (HCC) [name] is working on healthy weight loss and exercise to improve blood pressure control. We will watch for signs of hypotension as he continues his lifestyle modifications. - Comprehensive metabolic panel  6. Hyperlipidemia associated with type 2 diabetes mellitus (HCC) Cardiovascular risk and specific lipid/LDL goals reviewed.  We discussed several lifestyle modifications today and [name] will continue to work on diet, exercise and weight loss efforts. Orders and follow up as documented in patient record.   Counseling Intensive lifestyle modifications are the first line treatment for this issue. . Dietary changes: Increase soluble fiber. Decrease simple carbohydrates. . Exercise changes: Moderate to vigorous-intensity aerobic activity 150  minutes per week if tolerated. . Lipid-lowering medications: see documented in medical record. - Lipid panel  7. B12 deficiency The diagnosis was reviewed with the patient. Counseling provided today, see below. We will continue to monitor. Orders and follow up as documented in patient record.  Counseling . The body needs vitamin B12: to make red blood cells; to make DNA; and to help the nerves work properly so they can carry  messages from the brain to the body.  . The main causes of vitamin B12 deficiency include dietary deficiency, digestive diseases, pernicious anemia, and having a surgery in which part of the stomach or small intestine is removed.  . Certain medicines can make it harder for the body to absorb vitamin B12. These medicines include: heartburn medications; some antibiotics; some medications used to treat diabetes, gout, and high cholesterol.  . In some cases, there are no symptoms of this condition. If the condition leads to anemia or nerve damage, various symptoms can occur, such as weakness or fatigue, shortness of breath, and numbness or tingling in your hands and feet.   . Treatment:  o May include taking vitamin B12 supplements.  o Avoid alcohol.  o Eat lots of healthy foods that contain vitamin B12: - Beef, pork, chicken, [place], and organ meats, such as liver.  - Seafood: This includes clams, rainbow trout, salmon, tuna, and haddock. Eggs.  - Cereal and dairy products that are fortified: This means that vitamin B12 as been added to the food.   8. OSA on CPAP Intensive lifestyle modifications are the first line treatment for this issue. We discussed several lifestyle modifications today and he will continue to work on diet, exercise and weight loss efforts. We will continue to monitor. Orders and follow up as documented in patient record.   Counseling  Sleep apnea is a condition in which breathing pauses or becomes shallow during sleep. This happens over and over during the night. This disrupts your sleep and keeps your body from getting the rest that it needs, which can cause tiredness and lack of energy (fatigue) during the day.  Sleep apnea treatment: If you were given a device to open your airway while you sleep, USE IT!  Sleep hygiene:   Limit or avoid alcohol, caffeinated beverages, and cigarettes, especially close to bedtime.   Do not eat a large meal or eat spicy foods right before  bedtime. This can lead to digestive discomfort that can make it hard for you to sleep.  Keep a sleep diary to help you and your health care provider figure out what could be causing your insomnia.  . Make your bedroom a dark, comfortable place where it is easy to fall asleep. ? Put up shades or blackout curtains to block light from outside. ? Use a white noise machine to block noise. ? Keep the temperature cool. . Limit screen use before bedtime. This includes: ? Watching TV. ? Using your smartphone, tablet, or computer. . Stick to a routine that includes going to bed and waking up at the same times every day and night. This can help you fall asleep faster. Consider making a quiet activity, such as reading, part of your nighttime routine. . Try to avoid taking naps during the day so that you sleep better at night. . Get out of bed if you are still awake after 15 minutes of trying to sleep. Keep the lights down, but try reading or doing a quiet activity. When you feel sleepy, go back  to bed.  9. Other depression, with emotional eating Patient was referred to Dr. Dewaine Conger, our Bariatric Psychologist, for evaluation due to his elevated PHQ-9 score and significant struggles with emotional eating.  10. At risk for hypoglycemia [name] was given approximately 15 minutes of counseling today regarding prevention of hypoglycemia. He was advised of symptoms of hypoglycemia. Gevon was instructed to avoid skipping meals, eat regular protein rich meals and schedule low calorie snacks as needed.   Repetitive spaced learning was employed today to elicit superior memory formation and behavioral change  11. Class 3 severe obesity with serious comorbidity and body mass index (BMI) of 40.0 to 44.9 in adult, unspecified obesity type [facility]) Takai is currently in the action stage of change and his goal is to continue with weight loss efforts. I recommend [name] begin the structured treatment plan as follows:  He  has agreed to the Category 1 Plan.  Exercise goals: As is.   Behavioral modification strategies: increasing lean protein intake, decreasing simple carbohydrates, increasing water intake, decreasing liquid calories and planning for success.  He was informed of the importance of frequent follow-up visits to maximize his success with intensive lifestyle modifications for his multiple health conditions. He was informed we would discuss his lab results at his next visit unless there is a critical issue that needs to be addressed sooner. Luman agreed to keep his next visit at the agreed upon time to discuss these results.  Objective:   Blood pressure 138/74, pulse 78, temperature 97.7 F (36.5 C), temperature source Oral, height 5\' 11"  (1.803 m), weight (!) 310 lb (140.6 kg), SpO2 99 %. Body mass index is 43.24 kg/m.  EKG: Normal sinus rhythm, rate 76 bpm.  Indirect Calorimeter completed today shows a VO2 of 203 and a REE of 1410.  His calculated basal metabolic rate is thus his basal metabolic rate is worse than expected.  General: Cooperative, alert, well developed, in no acute distress. HEENT: Conjunctivae and lids unremarkable. Cardiovascular: Regular rhythm.  Lungs: Normal work of breathing. Neurologic: No focal deficits.   Lab Results  Component Value Date   CREATININE 1.30 05/18/2019   BUN 24 05/18/2019   NA 138 05/18/2019   K 4.8 05/18/2019   CL 101 05/18/2019   CO2 24 05/18/2019   Lab Results  Component Value Date   ALT 48 (H) 03/17/2019   AST 45 (H) 03/17/2019   ALKPHOS 159 (H) 03/17/2019   BILITOT 0.6 03/17/2019   Lab Results  Component Value Date   HGBA1C 8.8 (H) 03/13/2019   HGBA1C (H) 04/26/2010    11.9 (NOTE)                                                                       According to the ADA Clinical Practice Recommendations for 2011, when HbA1c is used as a screening test:   >=6.5%   Diagnostic of Diabetes Mellitus           (if abnormal result  is  confirmed)  5.7-6.4%   Increased risk of developing Diabetes Mellitus  References:Diagnosis and Classification of Diabetes Mellitus,Diabetes Care,2011,34(Suppl 1):S62-S69 and Standards of Medical Care in         Diabetes - 2011,Diabetes Care,2011,34  (Suppl 1):S11-S61.  No results found for: INSULIN Lab Results  Component Value Date   TSH 2.149 04/26/2010   Lab Results  Component Value Date   CHOL  04/27/2010    155        ATP III CLASSIFICATION:  <200     mg/dL   Desirable  308-657200-239  mg/dL   Borderline High  >=846>=240    mg/dL   High          HDL 38 (L) 04/27/2010   LDLCALC  04/27/2010    88        Total Cholesterol/HDL:CHD Risk Coronary Heart Disease Risk Table                     Men   Women  1/2 Average Risk   3.4   3.3  Average Risk       5.0   4.4  2 X Average Risk   9.6   7.1  3 X Average Risk  23.4   11.0        Use the calculated Patient Ratio above and the CHD Risk Table to determine the patient's CHD Risk.        ATP III CLASSIFICATION (LDL):  <100     mg/dL   Optimal  962-952100-129  mg/dL   Near or Above                    Optimal  130-159  mg/dL   Borderline  841-324160-189  mg/dL   High  >401>190     mg/dL   Very High   TRIG [id number] 04/27/2010   CHOLHDL 4.1 04/27/2010   Lab Results  Component Value Date   WBC 7.8 04/12/2019   HGB 13.3 04/12/2019   HCT 42.2 04/12/2019   MCV 90.8 04/12/2019   PLT 313 04/12/2019   Attestation Statements:   Reviewed by clinician on day of visit: allergies, medications, problem list, medical history, surgical history, family history, social history, and previous encounter notes.  I, Insurance claims handlerAmber Agner, CMA, am acting as Energy managertranscriptionist for Marsh & McLennanDeborah Frieda Arnall, DO.  I have reviewed the above documentation for accuracy and completeness, and I agree with the above. - Thomasene Lot*Uilani Sanville, DO

## 2019-09-02 ENCOUNTER — Other Ambulatory Visit: Payer: Self-pay | Admitting: Sports Medicine

## 2019-09-02 DIAGNOSIS — M25371 Other instability, right ankle: Secondary | ICD-10-CM

## 2019-09-02 NOTE — Telephone Encounter (Signed)
Please advise. Thanks.  

## 2019-09-04 LAB — LIPID PANEL
Chol/HDL Ratio: 3.4 ratio (ref 0.0–5.0)
Cholesterol, Total: 155 mg/dL (ref 100–199)
HDL: 46 mg/dL (ref >39)
LDL Chol Calc (NIH): 89 mg/dL (ref 0–99)
Triglycerides: 107 mg/dL (ref 0–149)
VLDL Cholesterol Cal: 20 mg/dL (ref 5–40)

## 2019-09-04 LAB — COMPREHENSIVE METABOLIC PANEL
ALT: 18 IU/L (ref 0–44)
AST: 20 IU/L (ref 0–40)
Albumin/Globulin Ratio: 1.8 (ref 1.2–2.2)
Albumin: 4.4 g/dL (ref 3.8–4.9)
Alkaline Phosphatase: 91 IU/L (ref 48–121)
BUN/Creatinine Ratio: 16 (ref 9–20)
BUN: 21 mg/dL (ref 6–24)
Bilirubin Total: 1.1 mg/dL (ref 0.0–1.2)
CO2: 25 mmol/L (ref 20–29)
Calcium: 9.6 mg/dL (ref 8.7–10.2)
Chloride: 101 mmol/L (ref 96–106)
Creatinine, Ser: 1.3 mg/dL — ABNORMAL HIGH (ref 0.76–1.27)
GFR calc Af Amer: 70 mL/min/{1.73_m2} (ref >59)
GFR calc non Af Amer: 61 mL/min/{1.73_m2} (ref >59)
Globulin, Total: 2.4 g/dL (ref 1.5–4.5)
Glucose: 92 mg/dL (ref 65–99)
Potassium: 4.8 mmol/L (ref 3.5–5.2)
Sodium: 140 mmol/L (ref 134–144)
Total Protein: 6.8 g/dL (ref 6.0–8.5)

## 2019-09-04 LAB — CBC WITH DIFFERENTIAL/PLATELET
Basophils Absolute: 0.1 10*3/uL (ref 0.0–0.2)
Basos: 1 %
EOS (ABSOLUTE): 0.2 10*3/uL (ref 0.0–0.4)
Eos: 3 %
Hematocrit: 43.8 % (ref 37.5–51.0)
Hemoglobin: 14.6 g/dL (ref 13.0–17.7)
Immature Grans (Abs): 0 10*3/uL (ref 0.0–0.1)
Immature Granulocytes: 0 %
Lymphocytes Absolute: 1.5 10*3/uL (ref 0.7–3.1)
Lymphs: 19 %
MCH: 29.2 pg (ref 26.6–33.0)
MCHC: 33.3 g/dL (ref 31.5–35.7)
MCV: 88 fL (ref 79–97)
Monocytes Absolute: 0.8 10*3/uL (ref 0.1–0.9)
Monocytes: 10 %
Neutrophils Absolute: 5.6 10*3/uL (ref 1.4–7.0)
Neutrophils: 67 %
Platelets: 320 10*3/uL (ref 150–450)
RBC: 5 x10E6/uL (ref 4.14–5.80)
RDW: 13.6 % (ref 11.6–15.4)
WBC: 8.3 10*3/uL (ref 3.4–10.8)

## 2019-09-04 LAB — VITAMIN B12: Vitamin B-12: 776 pg/mL (ref 232–1245)

## 2019-09-04 LAB — HEMOGLOBIN A1C
Est. average glucose Bld gHb Est-mCnc: 143 mg/dL
Hgb A1c MFr Bld: 6.6 % — ABNORMAL HIGH (ref 4.8–5.6)

## 2019-09-04 LAB — T3, FREE: T3, Free: 3.3 pg/mL (ref 2.0–4.4)

## 2019-09-04 LAB — VITAMIN D 25 HYDROXY (VIT D DEFICIENCY, FRACTURES): Vit D, 25-Hydroxy: 31.7 ng/mL (ref 30.0–100.0)

## 2019-09-04 LAB — T4, FREE: Free T4: 1.53 ng/dL (ref 0.82–1.77)

## 2019-09-04 LAB — TSH: TSH: 3.22 u[IU]/mL (ref 0.450–4.500)

## 2019-09-04 LAB — FOLATE: Folate: 5.5 ng/mL (ref >3.0)

## 2019-09-04 LAB — INSULIN, RANDOM: INSULIN: 28.9 u[IU]/mL — ABNORMAL HIGH (ref 2.6–24.9)

## 2019-09-07 ENCOUNTER — Other Ambulatory Visit: Payer: Self-pay | Admitting: Sports Medicine

## 2019-09-07 ENCOUNTER — Other Ambulatory Visit: Payer: Self-pay

## 2019-09-07 ENCOUNTER — Ambulatory Visit (INDEPENDENT_AMBULATORY_CARE_PROVIDER_SITE_OTHER): Payer: BC Managed Care – PPO | Admitting: *Deleted

## 2019-09-07 ENCOUNTER — Telehealth: Payer: Self-pay | Admitting: *Deleted

## 2019-09-07 DIAGNOSIS — B351 Tinea unguium: Secondary | ICD-10-CM

## 2019-09-07 MED ORDER — TERBINAFINE HCL 250 MG PO TABS: 250.0000 mg | ORAL_TABLET | Freq: Every day | ORAL | 0 refills | Status: DC

## 2019-09-07 NOTE — Telephone Encounter (Signed)
Patient was seen today for his 4th laser treatment for his fungal nails. Initial note says you would like to add oral terbinafine if little progress is seen after 3 treatments. Pictures of nails taken today for your review.   Just let me know if you want to add terbinafine to treatment or just continue with lasers at this time.   Thanks!!

## 2019-09-07 NOTE — Progress Notes (Signed)
Patient presents today for the 4th laser treatment. Diagnosed with mycotic nail infection by Dr. Marylene Land.   Toenail most affected are hallux nails bilateral and 2nd and 3rd right. There is a definitive clear line of new nail growth. The 3rd nail is completely clear.  All other systems are negative.  Nails were filed thin. Laser therapy was administered to 1st bilateral and 2nd and 3rd nails right and patient tolerated the treatment well. All safety precautions were in place.    Follow up in 6 weeks for laser # 5.  Per Dr. Wynema Birch note-she would like to add oral terbinafine if there has been no improvements after 3 treatments-will send pic of today's progress for review.   ~Sent pic to Dr. Marylene Land to see if she wanted to add oral therapy to patient's nail treatment~

## 2019-09-07 NOTE — Telephone Encounter (Signed)
Sent Lamisil to [facility]. He had blood work on 7/20 that was normal Nail pictures were reviewed with still significant thickness and mild discoloration of hallux nail bilateral -Dr. Kathie Rhodes

## 2019-09-07 NOTE — Progress Notes (Signed)
Sent Lamisil to [facility]. He had blood work on 7/20 that was normal Nail pictures were reviewed with still significant thickness and mild discoloration of hallux nail bilateral. Discussed case with CMA. -Dr. Marylene Land

## 2019-09-07 NOTE — Telephone Encounter (Addendum)
Called patient-informed him Dr. Marylene Land still felt the nails at this point still had significant thickness and discoloration and would like to start him on oral lamisil. He will pick up his prescription and start on it today.

## 2019-09-07 NOTE — Addendum Note (Signed)
Addended by: Alphia Kava D on: 09/07/2019 01:10 PM   Modules accepted: Orders

## 2019-09-07 NOTE — Progress Notes (Signed)
Entered in error

## 2019-09-08 DIAGNOSIS — J3489 Other specified disorders of nose and nasal sinuses: Secondary | ICD-10-CM | POA: Diagnosis not present

## 2019-09-08 DIAGNOSIS — Z20828 Contact with and (suspected) exposure to other viral communicable diseases: Secondary | ICD-10-CM | POA: Diagnosis not present

## 2019-09-09 ENCOUNTER — Encounter (INDEPENDENT_AMBULATORY_CARE_PROVIDER_SITE_OTHER): Payer: Self-pay | Admitting: Family Medicine

## 2019-09-09 NOTE — Telephone Encounter (Signed)
Called pt states his BG has been 66 last night and 67 this morning. He states his BG has dropped into the 60s several times over the last few days and it has been mostly at night time around 9 pm. He is eating dinner around 6-7pm. He is currently taking 40U of 70/30 at breakfast and dinner. If his BG <100 he does not take his insulin and will sometimes use a sliding scale based on how he is feeling. He c/o lightheadedness and seeing spots when BG drops educated pt on s/s of hypoglycemia. He denies any unconsciousness. States he will eat a piece of fruit when his BG is too low. He states he does not have an endocrinologist and his PCP usually manages his insulin. He does live alone.

## 2019-09-09 NOTE — Telephone Encounter (Signed)
Called pt back per Dr. Sharee Holster informed to cut insulin dosage in half and to contact his PCP to inform of his BG levels and schedule as follow up for insulin management. Pt verbalized understanding with repeat of instructions. Jeralene Peters, LPN

## 2019-09-11 DIAGNOSIS — Z20828 Contact with and (suspected) exposure to other viral communicable diseases: Secondary | ICD-10-CM | POA: Diagnosis not present

## 2019-09-11 DIAGNOSIS — R0981 Nasal congestion: Secondary | ICD-10-CM | POA: Diagnosis not present

## 2019-09-15 ENCOUNTER — Ambulatory Visit (INDEPENDENT_AMBULATORY_CARE_PROVIDER_SITE_OTHER): Payer: BC Managed Care – PPO | Admitting: Family Medicine

## 2019-09-16 ENCOUNTER — Ambulatory Visit (INDEPENDENT_AMBULATORY_CARE_PROVIDER_SITE_OTHER): Payer: BC Managed Care – PPO | Admitting: Psychology

## 2019-09-21 ENCOUNTER — Other Ambulatory Visit: Payer: Self-pay

## 2019-09-21 ENCOUNTER — Ambulatory Visit (INDEPENDENT_AMBULATORY_CARE_PROVIDER_SITE_OTHER): Payer: BC Managed Care – PPO | Admitting: Family Medicine

## 2019-09-21 ENCOUNTER — Encounter (INDEPENDENT_AMBULATORY_CARE_PROVIDER_SITE_OTHER): Payer: Self-pay | Admitting: Family Medicine

## 2019-09-21 ENCOUNTER — Encounter (INDEPENDENT_AMBULATORY_CARE_PROVIDER_SITE_OTHER): Payer: Self-pay

## 2019-09-21 ENCOUNTER — Encounter (INDEPENDENT_AMBULATORY_CARE_PROVIDER_SITE_OTHER): Payer: BC Managed Care – PPO | Admitting: Psychology

## 2019-09-21 VITALS — BP 162/88 | HR 82 | Temp 97.7°F | Ht 71.0 in | Wt 291.0 lb

## 2019-09-21 DIAGNOSIS — E1159 Type 2 diabetes mellitus with other circulatory complications: Secondary | ICD-10-CM

## 2019-09-21 DIAGNOSIS — E559 Vitamin D deficiency, unspecified: Secondary | ICD-10-CM

## 2019-09-21 DIAGNOSIS — Z6841 Body Mass Index (BMI) 40.0 and over, adult: Secondary | ICD-10-CM

## 2019-09-21 DIAGNOSIS — I10 Essential (primary) hypertension: Secondary | ICD-10-CM

## 2019-09-21 DIAGNOSIS — I152 Hypertension secondary to endocrine disorders: Secondary | ICD-10-CM

## 2019-09-21 DIAGNOSIS — E1169 Type 2 diabetes mellitus with other specified complication: Secondary | ICD-10-CM | POA: Diagnosis not present

## 2019-09-21 DIAGNOSIS — E785 Hyperlipidemia, unspecified: Secondary | ICD-10-CM

## 2019-09-21 DIAGNOSIS — Z794 Long term (current) use of insulin: Secondary | ICD-10-CM

## 2019-09-21 MED ORDER — VITAMIN D (ERGOCALCIFEROL) 1.25 MG (50000 UNIT) PO CAPS: 50000.0000 [IU] | ORAL_CAPSULE | ORAL | 0 refills | Status: DC

## 2019-09-22 NOTE — Progress Notes (Signed)
Chief Complaint:   OBESITY [name] is here to discuss his progress with his obesity treatment plan along with follow-up of his obesity related diagnoses. [name] is on the Category 1 Plan and states he is following his eating plan approximately 99% of the time. [name] states he is exercising for 0 minutes 0 times per week.  Today's visit was #: 2 Starting weight: 310 lbs Starting date: 09/01/2019 Today's weight: 291 lbs Today's date: 09/21/2019 Total lbs lost to date: 19 lbs Total lbs lost since last in-office visit: 19 lbs  Interim History: [name] is here for his first follow-up visit with me.  He started with COVID symptoms on July 27th and had a positive COVID test on 09/11/2019.  He has been in quarantine for 10 days since his symptoms began and now his symptoms are improved and essentially nonexistent, he says.  No fevers for several days.  He has stuck to the plan.  He says that at times this was difficult, but he recently bought a food scale and realized he was not eating enough proteins.  Some hunger, but only if he does not eat enough.  Cravings are well-controlled.  Subjective:   1. Type 2 diabetes mellitus with other specified complication, with long-term current use of insulin (HCC) Medications reviewed. Diabetic ROS: no polyuria or polydipsia, no chest pain, dyspnea or TIA's, no numbness, tingling or pain in extremities.  FBS ranging from 70s to 130s.  he is taking 1/2 his insulin dose.  See his message from 09/09/2019, where he was told to cut it.  He had 1 blood sugar in the 70s and felt a little funny, but nothing major.  He holds his insulin altogether if his pre-breakfast or pre-dinner blood sugar is less than 110.  He is not having any rebound highs.  On half the days of the week, he uses no insulin.  At his last check with his [facility] doctor in May, his A1c was 6.9.  Lab Results  Component Value Date   HGBA1C 6.6 (H) 09/01/2019   HGBA1C 8.8 (H) 03/13/2019   HGBA1C (H)  04/26/2010    11.9 (NOTE)                                                                       According to the ADA Clinical Practice Recommendations for 2011, when HbA1c is used as a screening test:   >=6.5%   Diagnostic of Diabetes Mellitus           (if abnormal result  is confirmed)  5.7-6.4%   Increased risk of developing Diabetes Mellitus  References:Diagnosis and Classification of Diabetes Mellitus,Diabetes Care,2011,34(Suppl 1):S62-S69 and Standards of Medical Care in         Diabetes - 2011,Diabetes Care,2011,34  (Suppl 1):S11-S61.   Lab Results  Component Value Date   LDLCALC 89 09/01/2019   CREATININE 1.30 (H) 09/01/2019   Lab Results  Component Value Date   INSULIN 28.9 (H) 09/01/2019   2. Hypertension associated with type 2 diabetes mellitus (HCC) Review: taking medications as instructed, no medication side effects noted, no chest pain on exertion, no dyspnea on exertion, no swelling of ankles.  His PCP recently added clonidine about 3 months ago.  [name] has not been able to check his blood pressure at home because he does not have a monitor.  BP Readings from Last 3 Encounters:  09/21/19 (!) 162/88  09/01/19 138/74  08/18/19 (!) 148/80   3. Hyperlipidemia associated with type 2 diabetes mellitus (HCC) Darelle has hyperlipidemia and has been trying to improve his cholesterol levels with intensive lifestyle modification including a low saturated fat diet, exercise and weight loss. He denies any chest pain, claudication or myalgias.  LDL is not at goal of less than 70.  Lab Results  Component Value Date   ALT 18 09/01/2019   AST 20 09/01/2019   ALKPHOS 91 09/01/2019   BILITOT 1.1 09/01/2019   Lab Results  Component Value Date   CHOL 155 09/01/2019   HDL 46 09/01/2019   LDLCALC 89 09/01/2019   TRIG 107 09/01/2019   CHOLHDL 3.4 09/01/2019   4. Vitamin D deficiency Augusto's Vitamin D level was 31.7 on 09/01/2019. He is currently taking OTC vitamin D 2000 IU each day.   He was increased to this dose in May. He denies nausea, vomiting or muscle weakness.  Assessment/Plan:   1. Type 2 diabetes mellitus with other specified complication, with long-term current use of insulin (HCC) Discussed labs with patient today.  Good blood sugar control is important to decrease the likelihood of diabetic complications such as nephropathy, neuropathy, limb loss, blindness, coronary artery disease, and death. Intensive lifestyle modification including diet, exercise and weight loss are the first line of treatment for diabetes. Continue current regimen and continue to wean amount of insulin.  Continue prudent nutritional plan and weight loss.  2. Hypertension associated with type 2 diabetes mellitus (HCC) Adelfo is working on healthy weight loss and exercise to improve blood pressure control. We will watch for signs of hypotension as he continues his lifestyle modifications.  Goal blood pressure is 135/85 or less.  He needs to get a monitor for home and check daily.  Keep log and bring in log.  Continue prudent nutritional plan and weight loss.  3. Hyperlipidemia associated with type 2 diabetes mellitus (HCC) Cardiovascular risk and specific lipid/LDL goals reviewed.  We discussed several lifestyle modifications today and [name] will continue to work on diet, exercise and weight loss efforts. Orders and follow up as documented in patient record.  He will engage in prudent nutritional plan and weight loss.  Continue current medications (Zocor 40 mg).  Recheck in 3-4 months.  Counseling Intensive lifestyle modifications are the first line treatment for this issue. . Dietary changes: Increase soluble fiber. Decrease simple carbohydrates. . Exercise changes: Moderate to vigorous-intensity aerobic activity 150 minutes per week if tolerated. . Lipid-lowering medications: see documented in medical record.  4. Vitamin D deficiency Low Vitamin D level contributes to fatigue and are  associated with obesity, breast, and colon cancer. He agrees to start to take prescription Vitamin D @50 ,000 IU every week and will follow-up for routine testing of Vitamin D, at least 2-3 times per year to avoid over-replacement.  Continue OTC dose as well.  Recheck in 3 months.  Continue prudent nutritional plan and weight loss. - Vitamin D, Ergocalciferol, (DRISDOL) 1.25 MG (50000 UNIT) CAPS capsule; Take 1 capsule (50,000 Units total) by mouth every 7 (seven) days.  Dispense: 4 capsule; Refill: 0  5. Class 3 severe obesity with serious comorbidity and body mass index (BMI) of 40.0 to 44.9 in adult, unspecified obesity type [facility]) Damarrion is currently in the action stage of change. As  such, his goal is to continue with weight loss efforts. He has agreed to the Category 1 Plan.   Exercise goals: As is.  Behavioral modification strategies: increasing lean protein intake, decreasing simple carbohydrates, increasing water intake, decreasing eating out, no skipping meals, keeping healthy foods in the home and planning for success.  Robbie has agreed to follow-up with our clinic in 2 weeks. He was informed of the importance of frequent follow-up visits to maximize his success with intensive lifestyle modifications for his multiple health conditions.   Objective:   Blood pressure (!) 162/88, pulse 82, temperature 97.7 F (36.5 C), temperature source Oral, height 5\' 11"  (1.803 m), weight 291 lb (132 kg), SpO2 97 %. Body mass index is 40.59 kg/m.  General: Cooperative, alert, well developed, in no acute distress. HEENT: Conjunctivae and lids unremarkable. Cardiovascular: Regular rhythm.  Lungs: Normal work of breathing. Neurologic: No focal deficits.   Lab Results  Component Value Date   CREATININE 1.30 (H) 09/01/2019   BUN 21 09/01/2019   NA 140 09/01/2019   K 4.8 09/01/2019   CL 101 09/01/2019   CO2 25 09/01/2019   Lab Results  Component Value Date   ALT 18 09/01/2019   AST 20  09/01/2019   ALKPHOS 91 09/01/2019   BILITOT 1.1 09/01/2019   Lab Results  Component Value Date   HGBA1C 6.6 (H) 09/01/2019   HGBA1C 8.8 (H) 03/13/2019   HGBA1C (H) 04/26/2010    11.9 (NOTE)                                                                       According to the ADA Clinical Practice Recommendations for 2011, when HbA1c is used as a screening test:   >=6.5%   Diagnostic of Diabetes Mellitus           (if abnormal result  is confirmed)  5.7-6.4%   Increased risk of developing Diabetes Mellitus  References:Diagnosis and Classification of Diabetes Mellitus,Diabetes Care,2011,34(Suppl 1):S62-S69 and Standards of Medical Care in         Diabetes - 2011,Diabetes Care,2011,34  (Suppl 1):S11-S61.   Lab Results  Component Value Date   INSULIN 28.9 (H) 09/01/2019   Lab Results  Component Value Date   TSH 3.220 09/01/2019   Lab Results  Component Value Date   CHOL 155 09/01/2019   HDL 46 09/01/2019   LDLCALC 89 09/01/2019   TRIG 107 09/01/2019   CHOLHDL 3.4 09/01/2019   Lab Results  Component Value Date   WBC 8.3 09/01/2019   HGB 14.6 09/01/2019   HCT 43.8 09/01/2019   MCV 88 09/01/2019   PLT 320 09/01/2019   Attestation Statements:   Reviewed by clinician on day of visit: allergies, medications, problem list, medical history, surgical history, family history, social history, and previous encounter notes.  Time spent on visit including pre-visit chart review and post-visit care and charting was 68++ minutes.   I, 09/03/2019, CMA, am acting as Insurance claims handler for Energy manager, DO.  I have reviewed the above documentation for accuracy and completeness, and I agree with the above. [employer], DO

## 2019-09-23 ENCOUNTER — Ambulatory Visit (INDEPENDENT_AMBULATORY_CARE_PROVIDER_SITE_OTHER): Payer: BC Managed Care – PPO | Admitting: Family Medicine

## 2019-09-25 ENCOUNTER — Ambulatory Visit
Admission: RE | Admit: 2019-09-25 | Discharge: 2019-09-25 | Disposition: A | Discharge status: Home / Self Care | Payer: BC Managed Care – PPO | Source: Ambulatory Visit | Attending: Allergy | Admitting: Allergy

## 2019-09-25 ENCOUNTER — Other Ambulatory Visit: Payer: Self-pay | Admitting: Allergy

## 2019-09-25 DIAGNOSIS — J3089 Other allergic rhinitis: Secondary | ICD-10-CM | POA: Diagnosis not present

## 2019-09-25 DIAGNOSIS — R059 Cough, unspecified: Secondary | ICD-10-CM

## 2019-09-25 DIAGNOSIS — R05 Cough: Secondary | ICD-10-CM

## 2019-09-25 DIAGNOSIS — K219 Gastro-esophageal reflux disease without esophagitis: Secondary | ICD-10-CM | POA: Diagnosis not present

## 2019-09-25 DIAGNOSIS — J301 Allergic rhinitis due to pollen: Secondary | ICD-10-CM | POA: Diagnosis not present

## 2019-10-05 ENCOUNTER — Other Ambulatory Visit: Payer: Self-pay

## 2019-10-05 ENCOUNTER — Encounter (INDEPENDENT_AMBULATORY_CARE_PROVIDER_SITE_OTHER): Payer: Self-pay | Admitting: Family Medicine

## 2019-10-05 ENCOUNTER — Ambulatory Visit (INDEPENDENT_AMBULATORY_CARE_PROVIDER_SITE_OTHER): Payer: BC Managed Care – PPO | Admitting: Family Medicine

## 2019-10-05 VITALS — BP 148/72 | HR 89 | Temp 97.8°F | Ht 71.0 in | Wt 286.0 lb

## 2019-10-05 DIAGNOSIS — Z6839 Body mass index (BMI) 39.0-39.9, adult: Secondary | ICD-10-CM

## 2019-10-05 DIAGNOSIS — E1159 Type 2 diabetes mellitus with other circulatory complications: Secondary | ICD-10-CM

## 2019-10-05 DIAGNOSIS — Z794 Long term (current) use of insulin: Secondary | ICD-10-CM | POA: Diagnosis not present

## 2019-10-05 DIAGNOSIS — I152 Hypertension secondary to endocrine disorders: Secondary | ICD-10-CM

## 2019-10-05 DIAGNOSIS — I10 Essential (primary) hypertension: Secondary | ICD-10-CM

## 2019-10-05 DIAGNOSIS — E1169 Type 2 diabetes mellitus with other specified complication: Secondary | ICD-10-CM | POA: Diagnosis not present

## 2019-10-05 NOTE — Progress Notes (Signed)
Chief Complaint:   OBESITY [name] is here to discuss his progress with his obesity treatment plan along with follow-up of his obesity related diagnoses. [name] is on the Category 1 Plan and states he is following his eating plan approximately 90% of the time. [name] states he is exercising for 0 minutes 0 times per week.  Today's visit was #: 3 Starting weight: 310 lbs Starting date: 09/01/2019 Today's weight: 286 lbs Today's date: 10/05/2019 Total lbs lost to date: 24 lbs Total lbs lost since last in-office visit: 5 lbs  Interim History:  [name] is here for his second follow-up visit with me.   He has recovered from having Covid recently and feeling much more like himself now.  He has stuck to the plan and now weighing his proteins and getting them in more regularly than prior.  His hunger- well controlled when he eats all foods on plan esp his proteins.  ( this is much improved from prior) Cravings are well-controlled.  No concerns or complaints.  Subjective:   1. Type 2 diabetes mellitus with other specified complication, with long-term current use of insulin (HCC) Medications reviewed. Diabetic ROS: no polyuria or polydipsia, no chest pain, dyspnea or TIA's, no numbness, tingling or pain in extremities.   -- FBS was running around 100-125, but hasn't been checking regularly recently.  He is on insulin that is managed by his PCP.   He is taking his insulin in the morning and not at night at all now.    He feels like he may be having lows, but is not checking most of the time but will start from here forward.  Lab Results  Component Value Date   HGBA1C 6.6 (H) 09/01/2019   HGBA1C 8.8 (H) 03/13/2019   HGBA1C (H) 04/26/2010    11.9 (NOTE)                                                                       According to the ADA Clinical Practice Recommendations for 2011, when HbA1c is used as a screening test:   >=6.5%   Diagnostic of Diabetes Mellitus           (if abnormal  result  is confirmed)  5.7-6.4%   Increased risk of developing Diabetes Mellitus  References:Diagnosis and Classification of Diabetes Mellitus,Diabetes Care,2011,34(Suppl 1):S62-S69 and Standards of Medical Care in         Diabetes - 2011,Diabetes Care,2011,34  (Suppl 1):S11-S61.   Lab Results  Component Value Date   LDLCALC 89 09/01/2019   CREATININE 1.30 (H) 09/01/2019   Lab Results  Component Value Date   INSULIN 28.9 (H) 09/01/2019   2. Hypertension associated with type 2 diabetes mellitus (HCC) Bp better than last OV.  Review: taking medications as instructed, no medication side effects noted, no chest pain on exertion, no dyspnea on exertion, no swelling of ankles.  -->  [name] bought an Omron as advised in the recent past.  He is checking his BP every 3 days or so.  He reports that it is running 160s-170s systolic at home still  BP Readings from Last 3 Encounters:  10/05/19 (!) 148/72  09/21/19 (!) 162/88  09/01/19 138/74   Assessment/Plan:   1.  Type 2 diabetes mellitus with other specified complication, with long-term current use of insulin (HCC) Good blood sugar control is important to decrease the likelihood of diabetic complications such as nephropathy, neuropathy, limb loss, blindness, coronary artery disease, and death. Intensive lifestyle modification including diet, exercise and weight loss are the first line of treatment for diabetes.  Decrease insulin dose down from 15 units to 10 units and if need be do 3-5 in the evening if morning sugars go over 140.  2. Hypertension associated with type 2 diabetes mellitus (HCC) [name] is working on healthy weight loss and exercise to improve blood pressure control. We will watch for signs of hypotension as he continues his lifestyle modifications.   --> Blood pressure is not at goal. He says he will call his PCP regarding changing his clonidine short acting tablet ( which he takes only once daily) to something such as a Catapres 24  hour patch along with any other blood pressure med changes they see fit.  Continue to monitor BP/BS closely and bring BP/BS logs in with each OV.  F/up sooner than planned if any new Sx/ concerns   3. Class 2 severe obesity with serious comorbidity and body mass index (BMI) of 39.0 to 39.9 in adult, unspecified obesity type [facility]) [name] is currently in the action stage of change. As such, his goal is to continue with weight loss efforts. He has agreed to the Category 1 Plan.   Exercise goals: As is.  Behavioral modification strategies: increasing lean protein intake, decreasing simple carbohydrates, increasing water intake, meal planning and cooking strategies and planning for success.  [name] has agreed to follow-up with our clinic in 2 weeks. He was informed of the importance of frequent follow-up visits to maximize his success with intensive lifestyle modifications for his multiple health conditions.   Objective:   Blood pressure (!) 148/72, pulse 89, temperature 97.8 F (36.6 C), height 5\' 11"  (1.803 m), weight 286 lb (129.7 kg), SpO2 98 %. Body mass index is 39.89 kg/m.  General: Cooperative, alert, well developed, in no acute distress. HEENT: Conjunctivae and lids unremarkable. Cardiovascular: Regular rhythm.  Lungs: Normal work of breathing. Neurologic: No focal deficits.   Lab Results  Component Value Date   CREATININE 1.30 (H) 09/01/2019   BUN 21 09/01/2019   NA 140 09/01/2019   K 4.8 09/01/2019   CL 101 09/01/2019   CO2 25 09/01/2019   Lab Results  Component Value Date   ALT 18 09/01/2019   AST 20 09/01/2019   ALKPHOS 91 09/01/2019   BILITOT 1.1 09/01/2019   Lab Results  Component Value Date   HGBA1C 6.6 (H) 09/01/2019   HGBA1C 8.8 (H) 03/13/2019   HGBA1C (H) 04/26/2010    11.9 (NOTE)                                                                       According to the ADA Clinical Practice Recommendations for 2011, when HbA1c is used as a screening test:    >=6.5%   Diagnostic of Diabetes Mellitus           (if abnormal result  is confirmed)  5.7-6.4%   Increased risk of developing Diabetes Mellitus  References:Diagnosis and Classification of  Diabetes Mellitus,Diabetes Care,2011,34(Suppl 1):S62-S69 and Standards of Medical Care in         Diabetes - 2011,Diabetes Care,2011,34  (Suppl 1):S11-S61.   Lab Results  Component Value Date   INSULIN 28.9 (H) 09/01/2019   Lab Results  Component Value Date   TSH 3.220 09/01/2019   Lab Results  Component Value Date   CHOL 155 09/01/2019   HDL 46 09/01/2019   LDLCALC 89 09/01/2019   TRIG 107 09/01/2019   CHOLHDL 3.4 09/01/2019   Lab Results  Component Value Date   WBC 8.3 09/01/2019   HGB 14.6 09/01/2019   HCT 43.8 09/01/2019   MCV 88 09/01/2019   PLT 320 09/01/2019   Attestation Statements:   Reviewed by clinician on day of visit: allergies, medications, problem list, medical history, surgical history, family history, social history, and previous encounter notes.  Time spent on visit including pre-visit chart review and post-visit care and charting was 30+ minutes.   I, Insurance claims handler, CMA, am acting as Energy manager for [employer], DO.  I have reviewed the above documentation for accuracy and completeness, and I agree with the above. -  Thomasene Lot, DO

## 2019-10-12 DIAGNOSIS — F331 Major depressive disorder, recurrent, moderate: Secondary | ICD-10-CM | POA: Diagnosis not present

## 2019-10-12 DIAGNOSIS — I2510 Atherosclerotic heart disease of native coronary artery without angina pectoris: Secondary | ICD-10-CM | POA: Diagnosis not present

## 2019-10-12 DIAGNOSIS — G4733 Obstructive sleep apnea (adult) (pediatric): Secondary | ICD-10-CM | POA: Diagnosis not present

## 2019-10-12 DIAGNOSIS — E1140 Type 2 diabetes mellitus with diabetic neuropathy, unspecified: Secondary | ICD-10-CM | POA: Diagnosis not present

## 2019-10-21 ENCOUNTER — Ambulatory Visit (INDEPENDENT_AMBULATORY_CARE_PROVIDER_SITE_OTHER): Payer: BC Managed Care – PPO | Admitting: Family Medicine

## 2019-10-21 ENCOUNTER — Other Ambulatory Visit: Payer: Self-pay

## 2019-10-21 ENCOUNTER — Encounter (INDEPENDENT_AMBULATORY_CARE_PROVIDER_SITE_OTHER): Payer: Self-pay | Admitting: Family Medicine

## 2019-10-21 VITALS — BP 146/81 | HR 78 | Temp 97.9°F | Ht 71.0 in | Wt 282.0 lb

## 2019-10-21 DIAGNOSIS — Z9189 Other specified personal risk factors, not elsewhere classified: Secondary | ICD-10-CM

## 2019-10-21 DIAGNOSIS — Z6839 Body mass index (BMI) 39.0-39.9, adult: Secondary | ICD-10-CM

## 2019-10-21 DIAGNOSIS — I152 Hypertension secondary to endocrine disorders: Secondary | ICD-10-CM

## 2019-10-21 DIAGNOSIS — Z794 Long term (current) use of insulin: Secondary | ICD-10-CM

## 2019-10-21 DIAGNOSIS — E1159 Type 2 diabetes mellitus with other circulatory complications: Secondary | ICD-10-CM | POA: Diagnosis not present

## 2019-10-21 DIAGNOSIS — E559 Vitamin D deficiency, unspecified: Secondary | ICD-10-CM

## 2019-10-21 DIAGNOSIS — E1169 Type 2 diabetes mellitus with other specified complication: Secondary | ICD-10-CM

## 2019-10-21 DIAGNOSIS — I10 Essential (primary) hypertension: Secondary | ICD-10-CM

## 2019-10-21 MED ORDER — VITAMIN D (ERGOCALCIFEROL) 1.25 MG (50000 UNIT) PO CAPS: 50000.0000 [IU] | ORAL_CAPSULE | ORAL | 0 refills | Status: DC

## 2019-10-22 NOTE — Progress Notes (Signed)
Chief Complaint:   OBESITY [name] is here to discuss his progress with his obesity treatment plan along with follow-up of his obesity related diagnoses. [name] is on the Category 1 Plan and states he is following his eating plan approximately 90% of the time. [name] states he is exercising for 0 minutes 0 times per week.  Today's visit was #: 4 Starting weight: 310 lbs Starting date: 09/01/2019 Today's weight: 282 lbs Today's date: 10/21/2019 Total lbs lost to date: 28 lbs Total lbs lost since last in-office visit: 4 lbs  Interim History: [name] says he had a cheat day when he went tubing down the [facility].  He drank beer all day and ate [place].  He says it was "not good".  He says his hunger is controlled.  Cravings are controlled when he follows the plan, and he thinks he has had improvement from prior due to being more conscientious with his protein intake.  Subjective:   1. Hypertension associated with type 2 diabetes mellitus (HCC) Review: taking medications as instructed, no medication side effects noted, no chest pain on exertion, no dyspnea on exertion, no swelling of ankles.  Blood pressure at home is running better than prior.  At home, 160s/80s.  He checks it 3 times per day or so.  He went to his PCP as recommended and he increased/doubled his clonidine dose form 0.1 mg to 0.2 mg and was told to take it twice daily instead of just once daily.  BP Readings from Last 3 Encounters:  10/21/19 (!) 146/81  10/05/19 (!) 148/72  09/21/19 (!) 162/88   2. Type 2 diabetes mellitus with other specified complication, with long-term current use of insulin (HCC) Medications reviewed. Diabetic ROS: no polyuria or polydipsia, no chest pain, dyspnea or TIA's, no numbness, tingling or pain in extremities.  At last office visit, we decreased his insulin dose.  He has been under good controle, except if he eats off plan.  Blood sugars have been running 120s-130s.  Two-hour postprandial - not  checking.   Lab Results  Component Value Date   HGBA1C 6.6 (H) 09/01/2019   HGBA1C 8.8 (H) 03/13/2019   HGBA1C (H) 04/26/2010    11.9 (NOTE)                                                                       According to the ADA Clinical Practice Recommendations for 2011, when HbA1c is used as a screening test:   >=6.5%   Diagnostic of Diabetes Mellitus           (if abnormal result  is confirmed)  5.7-6.4%   Increased risk of developing Diabetes Mellitus  References:Diagnosis and Classification of Diabetes Mellitus,Diabetes Care,2011,34(Suppl 1):S62-S69 and Standards of Medical Care in         Diabetes - 2011,Diabetes Care,2011,34  (Suppl 1):S11-S61.   Lab Results  Component Value Date   LDLCALC 89 09/01/2019   CREATININE 1.30 (H) 09/01/2019   Lab Results  Component Value Date   INSULIN 28.9 (H) 09/01/2019   3. Vitamin D deficiency [name]'s Vitamin D level was 31.7 on 09/01/2019. He is currently taking prescription vitamin D 50,000 IU each week. He denies nausea, vomiting or muscle weakness.  4. At risk for heart disease Due to [name]'s current state of health and medical condition(s), he is at a higher risk for heart disease.   This puts the patient at much greater risk to subsequently develop cardiopulmonary conditions that can significantly affect patient's quality of life in a negative manner as well.    Assessment/Plan:   1. Hypertension associated with type 2 diabetes mellitus (HCC) [name] is working on healthy weight loss and exercise to improve blood pressure control. We will watch for signs of hypotension as he continues his lifestyle modifications.  Continue blood pressure monitoring at home with goal blood pressure of 120s/80s or so.  Continue blood pressure medications, prudent nutritional plan, and weight loss and eventually add speed walking.  Recheck labs on 12/02/2019 or so.  2. Type 2 diabetes mellitus with other specified complication, with long-term current use  of insulin (HCC) Good blood sugar control is important to decrease the likelihood of diabetic complications such as nephropathy, neuropathy, limb loss, blindness, coronary artery disease, and death. Intensive lifestyle modification including diet, exercise and weight loss are the first line of treatment for diabetes.  Home blood sugar monitoring.  Recommend 2-hour postprandial and FBS and keep log.  Continue to wean insulin/meds as tolerated.  Recheck labs around 12/02/2019.  3. Vitamin D deficiency Low Vitamin D level contributes to fatigue and are associated with obesity, breast, and colon cancer. He agrees to continue to take prescription Vitamin D @50 ,000 IU every week and will follow-up for routine testing of Vitamin D, at least 2-3 times per year to avoid over-replacement.  Recheck labs around 12/02/2019.  -Refill Vitamin D, Ergocalciferol, (DRISDOL) 1.25 MG (50000 UNIT) CAPS capsule; Take 1 capsule (50,000 Units total) by mouth every 7 (seven) days.  Dispense: 4 capsule; Refill: 0  4. At risk for heart disease At least 15+ minutes was spent on counseling [name] about these concerns today and I stressed the importance of reversing risks factors of obesity, esp truncal and visceral fat, hypertension, hyperlipidemia, pre-diabetes.   Initial goal is to lose at least 5-10% of starting weight to help reduce these risk factors.   Counseling: Intensive lifestyle modifications discussed with [name] as most appropriate first line treatment.  he will continue to work on diet, exercise and weight loss efforts.  We will continue to reassess these conditions on a fairly regular basis in an attempt to decrease patient's overall morbidity and mortality.  Evidence-based interventions for health behavior change were utilized today including the discussion of self monitoring techniques, problem-solving barriers and SMART goal setting techniques.  Specifically regarding patient's less desirable eating habits and  patterns, we employed the technique of small changes when [name] has not been able to fully commit to his prudent nutritional plan.  5. Class 2 severe obesity with serious comorbidity and body mass index (BMI) of 39.0 to 39.9 in adult, unspecified obesity type [name]) [name] is currently in the action stage of change. As such, his goal is to continue with weight loss efforts. He has agreed to the Category 1 Plan.   Exercise goals: As is.  Behavioral modification strategies: increasing lean protein intake, increasing water intake, decreasing eating out, no skipping meals and celebration eating strategies (eating out).  [name] has agreed to follow-up with our clinic in 2 weeks. He was informed of the importance of frequent follow-up visits to maximize his success with intensive lifestyle modifications for his multiple health conditions.   Objective:   Blood pressure (!) 146/81, pulse 78, temperature  97.9 F (36.6 C), height 5\' 11"  (1.803 m), weight 282 lb (127.9 kg), SpO2 97 %. Body mass index is 39.33 kg/m.  General: Cooperative, alert, well developed, in no acute distress. HEENT: Conjunctivae and lids unremarkable. Cardiovascular: Regular rhythm.  Lungs: Normal work of breathing. Neurologic: No focal deficits.   Lab Results  Component Value Date   CREATININE 1.30 (H) 09/01/2019   BUN 21 09/01/2019   NA 140 09/01/2019   K 4.8 09/01/2019   CL 101 09/01/2019   CO2 25 09/01/2019   Lab Results  Component Value Date   ALT 18 09/01/2019   AST 20 09/01/2019   ALKPHOS 91 09/01/2019   BILITOT 1.1 09/01/2019   Lab Results  Component Value Date   HGBA1C 6.6 (H) 09/01/2019   HGBA1C 8.8 (H) 03/13/2019   HGBA1C (H) 04/26/2010    11.9 (NOTE)                                                                       According to the ADA Clinical Practice Recommendations for 2011, when HbA1c is used as a screening test:   >=6.5%   Diagnostic of Diabetes Mellitus           (if abnormal result   is confirmed)  5.7-6.4%   Increased risk of developing Diabetes Mellitus  References:Diagnosis and Classification of Diabetes Mellitus,Diabetes Care,2011,34(Suppl 1):S62-S69 and Standards of Medical Care in         Diabetes - 2011,Diabetes Care,2011,34  (Suppl 1):S11-S61.   Lab Results  Component Value Date   INSULIN 28.9 (H) 09/01/2019   Lab Results  Component Value Date   TSH 3.220 09/01/2019   Lab Results  Component Value Date   CHOL 155 09/01/2019   HDL 46 09/01/2019   LDLCALC 89 09/01/2019   TRIG 107 09/01/2019   CHOLHDL 3.4 09/01/2019   Lab Results  Component Value Date   WBC 8.3 09/01/2019   HGB 14.6 09/01/2019   HCT 43.8 09/01/2019   MCV 88 09/01/2019   PLT 320 09/01/2019   Attestation Statements:   Reviewed by clinician on day of visit: allergies, medications, problem list, medical history, surgical history, family history, social history, and previous encounter notes.  I, 09/03/2019, CMA, am acting as Insurance claims handler for Energy manager, DO.  I have reviewed the above documentation for accuracy and completeness, and I agree with the above. [employer], DO

## 2019-10-23 ENCOUNTER — Other Ambulatory Visit: Payer: BC Managed Care – PPO | Admitting: Orthotics

## 2019-10-23 ENCOUNTER — Ambulatory Visit (INDEPENDENT_AMBULATORY_CARE_PROVIDER_SITE_OTHER): Payer: BC Managed Care – PPO | Admitting: *Deleted

## 2019-10-23 ENCOUNTER — Other Ambulatory Visit: Payer: Self-pay

## 2019-10-23 DIAGNOSIS — M19012 Primary osteoarthritis, left shoulder: Secondary | ICD-10-CM | POA: Diagnosis not present

## 2019-10-23 DIAGNOSIS — B351 Tinea unguium: Secondary | ICD-10-CM

## 2019-10-23 DIAGNOSIS — M19011 Primary osteoarthritis, right shoulder: Secondary | ICD-10-CM | POA: Diagnosis not present

## 2019-10-23 NOTE — Progress Notes (Addendum)
Patient presents today for the 5th laser treatment. Diagnosed with mycotic nail infection by Dr. Marylene Land.   Toenail most affected are hallux nails bilateral and 2nd right.  The 3rd nail right is completely clear.  All other systems are negative.  Nails were filed thin. Laser therapy was administered to 1st bilateral and 2nd nail right and patient tolerated the treatment well. All safety precautions were in place.   Patient did start oral terbinafine on 09/07/19, taken about 6 weeks as of now.  Follow up in 8 weeks for laser #6.    ~Take final pic of nails next visit~

## 2019-11-03 ENCOUNTER — Ambulatory Visit (INDEPENDENT_AMBULATORY_CARE_PROVIDER_SITE_OTHER): Payer: BC Managed Care – PPO | Admitting: Internal Medicine

## 2019-11-03 ENCOUNTER — Other Ambulatory Visit: Payer: Self-pay

## 2019-11-03 ENCOUNTER — Encounter: Payer: Self-pay | Admitting: Internal Medicine

## 2019-11-03 VITALS — BP 118/64 | HR 64 | Ht 71.0 in | Wt 280.6 lb

## 2019-11-03 DIAGNOSIS — E1169 Type 2 diabetes mellitus with other specified complication: Secondary | ICD-10-CM | POA: Diagnosis not present

## 2019-11-03 DIAGNOSIS — I152 Hypertension secondary to endocrine disorders: Secondary | ICD-10-CM

## 2019-11-03 DIAGNOSIS — E1159 Type 2 diabetes mellitus with other circulatory complications: Secondary | ICD-10-CM

## 2019-11-03 DIAGNOSIS — I25118 Atherosclerotic heart disease of native coronary artery with other forms of angina pectoris: Secondary | ICD-10-CM | POA: Insufficient documentation

## 2019-11-03 DIAGNOSIS — I10 Essential (primary) hypertension: Secondary | ICD-10-CM

## 2019-11-03 DIAGNOSIS — E782 Mixed hyperlipidemia: Secondary | ICD-10-CM

## 2019-11-03 DIAGNOSIS — I2510 Atherosclerotic heart disease of native coronary artery without angina pectoris: Secondary | ICD-10-CM

## 2019-11-03 DIAGNOSIS — E669 Obesity, unspecified: Secondary | ICD-10-CM

## 2019-11-03 MED ORDER — ATORVASTATIN CALCIUM 10 MG PO TABS: 10.0000 mg | ORAL_TABLET | Freq: Every day | ORAL | 3 refills | Status: DC

## 2019-11-03 NOTE — Patient Instructions (Signed)
Medication Instructions:  Your physician has recommended you make the following change in your medication:   1. STOP: simvastatin  2. START: atorvastatin (lipitor) 10 mg tablet: Take 1 tablet by mouth once a day  *If you need a refill on your cardiac medications before your next appointment, please call your pharmacy*   Lab Work: Your physician recommends that you return for a FASTING lipid profile and liver function panel in 3 months  If you have labs (blood work) drawn today and your tests are completely normal, you will receive your results only by:  MyChart Message (if you have MyChart) OR  A paper copy in the mail If you have any lab test that is abnormal or we need to change your treatment, we will call you to review the results.   Testing/Procedures: None   Follow-Up: At [facility], you and your health needs are our priority.  As part of our continuing mission to provide you with exceptional heart care, we have created designated Provider Care Teams.  These Care Teams include your primary Cardiologist (physician) and Advanced Practice Providers (APPs -  Physician Assistants and Nurse Practitioners) who all work together to provide you with the care you need, when you need it.  We recommend signing up for the patient portal called "MyChart".  Sign up information is provided on this After Visit Summary.  MyChart is used to connect with patients for Virtual Visits (Telemedicine).  Patients are able to view lab/test results, encounter notes, upcoming appointments, etc.  Non-urgent messages can be sent to your provider as well.   To learn more about what you can do with MyChart, go to [email or web address].    Your next appointment:   12 month(s)  The format for your next appointment:   In Person  Provider:   You may see Christell Constant, MD or one of the following Advanced Practice Providers on your designated Care Team:    Ronie Spies, PA-C  Jacolyn Reedy,  PA-C    Other Instructions None

## 2019-11-03 NOTE — Progress Notes (Signed)
Cardiology Office Note:    Date:  11/03/2019   ID:  ADARRIUS GRAEFF, DOB Aug 29, 1960, MRN [id number]  PCP:  Marden Noble, MD  [facility] Cardiologist:  Christell Constant, MD  [facility] Electrophysiologist:  None   Referring MD: Marden Noble, MD   CC: Preop Surgery Consulted for the evaluation of preoperative risk assessment for shoulder surgery at the behest of Marden Noble, MD  History of Present Illness:    [name] is a 59 y.o. male with a hx of Diabetes with retinopathy and prior DKA; Obesity, diabetes  and hypertension syndrome (last BP 146/81)., HLD on simvastatin, OSA on CPAP CAD History of Inferior STEMI in 2012 with indigestion pain with d RCA stent 04/29/2010  Tolerated right foot surgery January of 2021 without incident.  Healed well, no perioperative MI.  Patient has lost 35 lbs and is being seen at the wellness center.Patient is able to walk twice a week.  Able to walk 1-2 blocks on level ground and walk up a flight of stairs. No anginal no SOB, No DOE, no syncope, no palpitations.  Patient notes prior indigestion as his angina but can't really remember his sx.  Duke Activity Status Index : 6.91 METS; 34 points  Past Medical History:  Diagnosis Date  . ADD (attention deficit disorder)   . Arthritis, shoulder region   . B12 deficiency   . Back pain   . Diabetes (HCC)   . Diabetic retinopathy (HCC)   . H/O heart artery stent 04/2010  . Hyperlipidemia   . Hypertension   . Joint pain   . Knee pain   . Neuropathy   . NSTEMI (non-ST elevated myocardial infarction) (HCC)   . Obesity   . Osteoarthritis   . Persistent cough   . Sleep apnea   . Swallowing difficulty   . Swelling of both lower extremities     Past Surgical History:  Procedure Laterality Date  . ACHILLES TENDON REPAIR Right   . AMPUTATION TOE Right 03/14/2019   Procedure: AMPUTATION TOE, 5th toe;  Surgeon: [name], DPM;  Location: [facility] OR;  Service: Podiatry;  Laterality:  Right;  . heart stent  2012   x1  . INCISION AND DRAINAGE Right 03/14/2019   Procedure: INCISION AND DRAINAGE;  Surgeon: [name], DPM;  Location: [facility] OR;  Service: Podiatry;  Laterality: Right;  . SHOULDER ARTHROSCOPY Right   . TEE WITHOUT CARDIOVERSION N/A 03/17/2019   Procedure: TRANSESOPHAGEAL ECHOCARDIOGRAM (TEE);  Surgeon: Chilton Si, MD;  Location: [facility] ENDOSCOPY;  Service: Cardiovascular;  Laterality: N/A;  . TOE SURGERY Right 06/2018   for a hammer toe  . UVULOPALATOPHARYNGOPLASTY      Current Medications: Current Meds  Medication Sig  . albuterol (VENTOLIN HFA) 108 (90 Base) MCG/ACT inhaler Inhale 2 puffs into the lungs every 6 (six) hours as needed for wheezing or shortness of breath.  Marland Kitchen amLODipine (NORVASC) 5 MG tablet Take 5 mg by mouth daily.  Marland Kitchen aspirin EC 81 MG tablet Take 81 mg by mouth daily.  . BD INSULIN SYRINGE U/F 31G X 5/16" 1 ML MISC See admin instructions.  . cetirizine (ZYRTEC) 10 MG tablet Take 10 mg by mouth daily.  . cholecalciferol (VITAMIN D3) 25 MCG (1000 UNIT) tablet Take 1,000 Units by mouth daily.  . cloNIDine (CATAPRES) 0.1 MG tablet Take 0.2 mg by mouth daily.   Marland Kitchen glimepiride (AMARYL) 4 MG tablet Take 4 mg by mouth daily with breakfast.   . insulin NPH-regular  Human (NOVOLIN 70/30) (70-30) 100 UNIT/ML injection Inject 40-50 Units into the skin 2 (two) times daily with a meal.   . loratadine (CLARITIN) 10 MG tablet Take 10 mg by mouth daily.   . Magnesium 400 MG CAPS Take 1 tablet by mouth daily.  . metFORMIN (GLUCOPHAGE-XR) 500 MG 24 hr tablet Take 1,000 mg by mouth 2 (two) times daily.   . pantoprazole (PROTONIX) 40 MG tablet TAKE 1 TABLET BY MOUTH TWICE DAILY  . simvastatin (ZOCOR) 40 MG tablet Take 40 mg by mouth every evening.   Marland Kitchen Spacer/Aero-Holding Chambers (AEROCHAMBER MV) inhaler Use as instructed (Patient taking differently: as needed. Use as instructed for cough)  . terbinafine (LAMISIL) 250 MG tablet Take 1 tablet (250 mg total) by  mouth daily.  Marland Kitchen terbinafine (LAMISIL) 250 MG tablet Take 1 tablet (250 mg total) by mouth daily.  . vitamin B-12 (CYANOCOBALAMIN) 1000 MCG tablet Take 1,000 mcg by mouth daily.  . Vitamin D, Ergocalciferol, (DRISDOL) 1.25 MG (50000 UNIT) CAPS capsule Take 1 capsule (50,000 Units total) by mouth every 7 (seven) days.     Allergies:   Patient has no known allergies.   Social History   Socioeconomic History  . Marital status: Divorced    Spouse name: Not on file  . Number of children: Not on file  . Years of education: Not on file  . Highest education level: Not on file  Occupational History  . Occupation: unemployed  Tobacco Use  . Smoking status: Passive Smoke Exposure - Never Smoker  . Smokeless tobacco: Never Used  . Tobacco comment: Both parents growing up.   Vaping Use  . Vaping Use: Never used  Substance and Sexual Activity  . Alcohol use: Yes    Comment: 4-5 on weekends  . Drug use: No  . Sexual activity: Not on file  Other Topics Concern  . Not on file  Social History Narrative   Originally from [place], [place]. Previously has lived in [place] & [place]. 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 No pets currently. No bird or hot tub exposure. Could have mold in his current home and has lived there for 1.5 years now.    Social Determinants of Health   Financial Resource Strain:   . Difficulty of Paying Living Expenses: Not on file  Food Insecurity:   . Worried About Programme researcher, broadcasting/film/video in the Last Year: Not on file  . Ran Out of Food in the Last Year: Not on file  Transportation Needs:   . Lack of Transportation (Medical): Not on file  . Lack of Transportation (Non-Medical): Not on file  Physical Activity:   . Days of Exercise per Week: Not on file  . Minutes of Exercise per Session: Not on file  Stress:   . Feeling of Stress : Not on file  Social Connections:   . Frequency of Communication with Friends and Family: Not on file    . Frequency of Social Gatherings with Friends and Family: Not on file  . Attends Religious Services: Not on file  . Active Member of Clubs or Organizations: Not on file  . Attends Banker Meetings: Not on file  . Marital Status: Not on file    Family History: The patient's family history includes Asthma in his maternal uncle; Breast cancer in his mother; Cancer in his mother and another family member; Diabetes in his father and another family member; Heart disease  in his father and another family member; Leukemia in his brother; Stroke in his father.  ROS:   Please see the history of present illness.    Notes persistent cough (not on ACEi)  All other systems reviewed and are negative.  EKGs/Labs/Other Studies Reviewed:    The following studies were reviewed today:  EKG:   08/2019 sinus rhythm, rate 84, inferior q waves  Recent Labs: 03/17/2019: Magnesium 2.1 09/01/2019: ALT 18; BUN 21; Creatinine, Ser 1.30; Hemoglobin 14.6; Platelets 320; Potassium 4.8; Sodium 140; TSH 3.220  Recent Lipid Panel    Component Value Date/Time   CHOL 155 09/01/2019 1508   TRIG 107 09/01/2019 1508   HDL 46 09/01/2019 1508   CHOLHDL 3.4 09/01/2019 1508   CHOLHDL 4.1 04/27/2010 0035   VLDL 29 04/27/2010 0035   LDLCALC 89 09/01/2019 1508   04/29/2010 CONCLUSION (reprot only) 1. Recent inferior infarction with spontaneous recanalization of the     right coronary with a 99% distal thrombus filled lesion.  Inferior     wall motion is still present.  It is felt that there is viable     inferior wall muscle. 2. Successful stenting of this distal lesion from 99% to 0% with TIMI     grade 3 flow. 3. High-grade stenosis in the distal circumflex, subtending a small     area of inferolateral myocardium, supplied by two small obtuse     marginal branches.  This will be treated medically. 4. Mild-to-moderate mid-LAD disease. 5. Mild-to-moderate inferior wall hypokinesis.  TEE 03/17/19:  No  thrombus in LAA; normal LV without valvular pathology  Physical Exam:    VS:  BP 118/64   Pulse 64   Ht 5\' 11"  (1.803 m)   Wt 280 lb 9.6 oz (127.3 kg)   SpO2 96%   BMI 39.14 kg/m     Wt Readings from Last 3 Encounters:  11/03/19 280 lb 9.6 oz (127.3 kg)  10/21/19 282 lb (127.9 kg)  10/05/19 286 lb (129.7 kg)     GEN: Obese Well nourished, well developed in no acute distress HEENT: Normal NECK: No JVD; No carotid bruits LYMPHATICS: No lymphadenopathy CARDIAC: RRR, no murmurs, rubs, gallops RESPIRATORY:  Clear to auscultation without rales, wheezing or rhonchi  ABDOMEN: Soft, non-tender, non-distended MUSCULOSKELETAL:  No edema; No deformity  SKIN: Warm and dry NEUROLOGIC:  Alert and oriented x 3 PSYCHIATRIC:  Normal affect   ASSESSMENT:    1. Obesity, diabetes, and hypertension syndrome (HCC)   2. Moderate mixed hyperlipidemia not requiring statin therapy    PLAN:    In order of problems listed above:  Preoperative Risk Assessment - The Revised Cardiac Risk Index = 2 (prior CAD and Insulin use) for shoulder surgery,  low risk; 2=6.6%: estimated risk of perioperative myocardial infarction, pulmonary edema, ventricular fibrillation, cardiac arrest, or complete heart block.  - Duke Activity Status Index : 6.91 METS; 34 points; can walke 1-2 blocks on level ground and walk up a flight of stairs.  - No further cardiac testing is recommended prior to surgery.  - The patient may proceed to surgery at acceptable risk.   - Our service is available as needed in the peri-operative period.    Obesity, HTN, and DM; HLD; CAD and Morbid Obesity - BP controlled on present medication - STEMI was 9 years ago (3+ year ago):  Will not add new BB at this time, can go of ASA as need per ortho - will change simvastatin to atorvastatin 10  mg (LDL goal < 70); LFTs & Lipids in 3 months - discussed exercise and diet management - will continue other cardiovascular medications  Will see in one  year unless new symptoms occur  Medication Adjustments/Labs and Tests Ordered: Current medicines are reviewed at length with the patient today.  Concerns regarding medicines are outlined above.  No orders of the defined types were placed in this encounter.  No orders of the defined types were placed in this encounter.   There are no Patient Instructions on file for this visit.   Signed, Christell ConstantMahesh A Yona Stansbury, MD  11/03/2019 9:33 AM    [facility]

## 2019-11-04 ENCOUNTER — Other Ambulatory Visit: Payer: Self-pay

## 2019-11-04 ENCOUNTER — Encounter (INDEPENDENT_AMBULATORY_CARE_PROVIDER_SITE_OTHER): Payer: Self-pay | Admitting: Family Medicine

## 2019-11-04 ENCOUNTER — Ambulatory Visit (INDEPENDENT_AMBULATORY_CARE_PROVIDER_SITE_OTHER): Payer: BC Managed Care – PPO | Admitting: Family Medicine

## 2019-11-04 VITALS — BP 128/73 | HR 57 | Temp 98.0°F | Ht 71.0 in | Wt 275.0 lb

## 2019-11-04 DIAGNOSIS — E1159 Type 2 diabetes mellitus with other circulatory complications: Secondary | ICD-10-CM | POA: Diagnosis not present

## 2019-11-04 DIAGNOSIS — G4733 Obstructive sleep apnea (adult) (pediatric): Secondary | ICD-10-CM | POA: Diagnosis not present

## 2019-11-04 DIAGNOSIS — E1169 Type 2 diabetes mellitus with other specified complication: Secondary | ICD-10-CM

## 2019-11-04 DIAGNOSIS — E785 Hyperlipidemia, unspecified: Secondary | ICD-10-CM

## 2019-11-04 DIAGNOSIS — Z9189 Other specified personal risk factors, not elsewhere classified: Secondary | ICD-10-CM | POA: Diagnosis not present

## 2019-11-04 DIAGNOSIS — I152 Hypertension secondary to endocrine disorders: Secondary | ICD-10-CM

## 2019-11-04 DIAGNOSIS — E559 Vitamin D deficiency, unspecified: Secondary | ICD-10-CM | POA: Diagnosis not present

## 2019-11-04 DIAGNOSIS — I10 Essential (primary) hypertension: Secondary | ICD-10-CM

## 2019-11-04 DIAGNOSIS — Z794 Long term (current) use of insulin: Secondary | ICD-10-CM

## 2019-11-04 DIAGNOSIS — Z6838 Body mass index (BMI) 38.0-38.9, adult: Secondary | ICD-10-CM

## 2019-11-04 MED ORDER — INSULIN NPH ISOPHANE & REGULAR (70-30) 100 UNIT/ML ~~LOC~~ SUSP: 5.0000 [IU] | Freq: Two times a day (BID) | SUBCUTANEOUS | Status: DC

## 2019-11-06 ENCOUNTER — Ambulatory Visit (INDEPENDENT_AMBULATORY_CARE_PROVIDER_SITE_OTHER): Payer: BC Managed Care – PPO | Admitting: Orthotics

## 2019-11-06 ENCOUNTER — Telehealth: Payer: Self-pay | Admitting: Internal Medicine

## 2019-11-06 ENCOUNTER — Other Ambulatory Visit: Payer: Self-pay

## 2019-11-06 DIAGNOSIS — M14679 Charcot's joint, unspecified ankle and foot: Secondary | ICD-10-CM

## 2019-11-06 DIAGNOSIS — M25372 Other instability, left ankle: Secondary | ICD-10-CM | POA: Diagnosis not present

## 2019-11-06 NOTE — Telephone Encounter (Signed)
   Primary Cardiologist: Christell Constant, MD  Chart reviewed as part of pre-operative protocol coverage. Given past medical history and time since last visit, based on ACC/AHA guidelines, GIVANNI STARON would be at acceptable risk for the planned procedure without further cardiovascular testing.   Per Note: 11/03/2019 Preoperative Risk Assessment - The Revised Cardiac Risk Index = 2 (prior CAD and Insulin use) for shoulder surgery,  low risk; 2=6.6%: estimated risk of perioperative myocardial infarction, pulmonary edema, ventricular fibrillation, cardiac arrest, or complete heart block.  - Duke Activity Status Index : 6.91 METS; 34 points; can walke 1-2 blocks on level ground and walk up a flight of stairs.  - No further cardiac testing is recommended prior to surgery.  - The patient may proceed to surgery at acceptable risk.   - Our service is available as needed in the peri-operative period.    I will route this recommendation to the requesting party via Epic fax function and remove from pre-op pool.  Please call with questions.  Bettey Mare. Liborio Nixon, ANP, AACC  11/06/2019, 4:42 PM

## 2019-11-06 NOTE — Progress Notes (Signed)
Picked up az brace; great fit.

## 2019-11-06 NOTE — Telephone Encounter (Signed)
° °  [facility] Pre-operative Risk Assessment    HEARTCARE STAFF: - Please ensure there is not already an duplicate clearance open for this procedure. - Under Visit Info/Reason for Call, type in Other and utilize the format Clearance MM/DD/YY or Clearance TBD. Do not use dashes or single digits. - If request is for dental extraction, please clarify the # of teeth to be extracted.  Request for surgical clearance:  1. What type of surgery is being performed? Right Total Shoulder Arthroplasty  2. When is this surgery scheduled? TBD  3. What type of clearance is required (medical clearance vs. Pharmacy clearance to hold med vs. Both)? Both  4. Are there any medications that need to be held prior to surgery and how long? To be checked by cardiologist   5. Practice name and name of physician performing surgery? [facility]; Dr. Tamera Punt  6. What is the office phone number? [phone]   7.   What is the office fax number? [phone]  8.   Anesthesia type (None, local, MAC, general) ? Choice   Durel Salts 11/06/2019, 4:28 PM  _________________________________________________________________   (provider comments below)

## 2019-11-08 NOTE — Progress Notes (Signed)
Chief Complaint:   OBESITY [name] is here to discuss his progress with his obesity treatment plan along with follow-up of his obesity related diagnoses. [name] is on the Category 1 Plan and states he is following his eating plan approximately 90% of the time. [name] states he is exercising for 0 minutes 0 times per week.  Today's visit was #: 5 Starting weight: 310 lbs Starting date: 09/01/2019 Today's weight: 275 lbs Today's date: 11/04/2019 Total lbs lost to date: 35 lbs Total lbs lost since last in-office visit: 7 lbs  Interim History: [name] says he has really been sticking with the plan and when he eats "off plan" he is more mindful and doing PC/[place].  He states that when he eats all foods on plan, his cravings are well controlled.  Hunger is controlled.  He has some cravings, but increasing his protein intake resolves that.    He saw [name] at [facility] and will be having right shoulder surgery in the near future.  He has gotten clearance from Cardiology already.   Subjective:   1. Type 2 diabetes mellitus with other specified complication, with long-term current use of insulin (HCC) Medications reviewed. Diabetic ROS: no polyuria or polydipsia, no chest pain, dyspnea or TIA's, no numbness, tingling or pain in extremities.  FBS high 140s, low 64 once.  He had symptoms of vision changes (slightly blurry).  He takes NPH 70/30, metformin, and Amaryl daily.  He is down to 10 units of NPH in the morning only.  Lab Results  Component Value Date   HGBA1C 6.6 (H) 09/01/2019   HGBA1C 8.8 (H) 03/13/2019   HGBA1C (H) 04/26/2010    11.9 (NOTE)                                                                       According to the ADA Clinical Practice Recommendations for 2011, when HbA1c is used as a screening test:   >=6.5%   Diagnostic of Diabetes Mellitus           (if abnormal result  is confirmed)  5.7-6.4%   Increased risk of developing Diabetes Mellitus   References:Diagnosis and Classification of Diabetes Mellitus,Diabetes Care,2011,34(Suppl 1):S62-S69 and Standards of Medical Care in         Diabetes - 2011,Diabetes Care,2011,34  (Suppl 1):S11-S61.   Lab Results  Component Value Date   LDLCALC 89 09/01/2019   CREATININE 1.30 (H) 09/01/2019   Lab Results  Component Value Date   INSULIN 28.9 (H) 09/01/2019   2. Hypertension associated with type 2 diabetes mellitus (HCC) Review: taking medications as instructed, no medication side effects noted, no chest pain on exertion, no dyspnea on exertion, no swelling of ankles.  He I son Norvasc and Catapres daily.  On 09/01/2019, creatinine was 1.30.  Denies side effects or concerns.  Asymptomatic.  Blood pressure has been well-controlled when checking at home and at doctor's office visits recently.  BP Readings from Last 3 Encounters:  11/04/19 128/73  11/03/19 118/64  10/21/19 (!) 146/81   3. Hyperlipidemia associated with type 2 diabetes mellitus (HCC) Brenan has hyperlipidemia and has been trying to improve his cholesterol levels with intensive lifestyle modification including a low saturated fat diet,  exercise and weight loss. He denies any chest pain, claudication or myalgias.  He is on Lipitor 10 mg daily.  On 09/01/2019, LDL was 89.  Lab Results  Component Value Date   ALT 18 09/01/2019   AST 20 09/01/2019   ALKPHOS 91 09/01/2019   BILITOT 1.1 09/01/2019   Lab Results  Component Value Date   CHOL 155 09/01/2019   HDL 46 09/01/2019   LDLCALC 89 09/01/2019   TRIG 107 09/01/2019   CHOLHDL 3.4 09/01/2019   4. At risk for side effect of medication [name] is at risk for side effect of medication in the form of hypoglycemia due to continued weight loss as he is taking diabetic medications.  Assessment/Plan:   1. Type 2 diabetes mellitus with other specified complication, with long-term current use of insulin (HCC) Good blood sugar control is important to decrease the likelihood of  diabetic complications such as nephropathy, neuropathy, limb loss, blindness, coronary artery disease, and death. Intensive lifestyle modification including diet, exercise and weight loss are the first line of treatment for diabetes.  Change to 5 units 70/30 twice daily instead of 10 units in the morning.   - We added extra an afternoon snack of higher protein as well.    -continue to closely monitor at home.  Follow-up in 2 weeks.  -Refill insulin NPH-regular Human (70-30) 100 UNIT/ML injection; Inject 5 Units into the skin 2 (two) times daily with a meal.  Dispense: 10 mL   2. Hypertension associated with type 2 diabetes mellitus (HCC) [name] is working on healthy weight loss and exercise to improve blood pressure control. We will watch for signs of hypotension as he continues his lifestyle modifications.  Blood pressure is at goal today (for the first time in a long time, he says).    PCP increased Catapres dose two office visits ago at our suggestion.  He is tolerating it well.    Continue to check at home and keep log.    Consider adding Losartan/ARB in the future for neuroprotection and secondary to diabetes.   3. Hyperlipidemia associated with type 2 diabetes mellitus (HCC) Cardiovascular risk and specific lipid/LDL goals reviewed.  We discussed several lifestyle modifications today and Jerrad will continue to work on diet, exercise and weight loss efforts. Orders and follow up as documented in patient record.  Weight loss and prudent nutritional plan to goal LDL <70.    - Cont lipitor 10mg  q hs  - Recheck 3-4 months from last.  Weight loss and healthier lifestyle.   Counseling Intensive lifestyle modifications are the first line treatment for this issue. . Dietary changes: Increase soluble fiber. Decrease simple carbohydrates. . Exercise changes: Moderate to vigorous-intensity aerobic activity 150 minutes per week if tolerated. . Lipid-lowering medications: see documented in  medical record.    4. At risk for side effect of medication [name] was given approximately 15 minutes of drug side effect counseling today.  We discussed side effect possibility and risk versus benefits. [name] agreed to the medication and will contact this office if these side effects are intolerable.   5. Class 2 severe obesity with serious comorbidity and body mass index (BMI) of 38.0 to 38.9 in adult, unspecified obesity type [facility]) [name] is currently in the action stage of change. As such, his goal is to continue with weight loss efforts. He has agreed to the Category 1 Plan plus today will add 100 calories with 10 grams of protein or more.   Exercise goals: All  adults should avoid inactivity. Some physical activity is better than none, and adults who participate in any amount of physical activity gain some health benefits.  Behavioral modification strategies: increasing lean protein intake, increasing water intake, no skipping meals, better snacking choices and planning for success.  [name] has agreed to follow-up with our clinic in 2 weeks. He was informed of the importance of frequent follow-up visits to maximize his success with intensive lifestyle modifications for his multiple health conditions.   Objective:   Blood pressure 128/73, pulse (!) 57, temperature 98 F (36.7 C), height 5\' 11"  (1.803 m), weight 275 lb (124.7 kg), SpO2 98 %. Body mass index is 38.35 kg/m.  General: Cooperative, alert, well developed, in no acute distress. HEENT: Conjunctivae and lids unremarkable. Cardiovascular: Regular rhythm.  Lungs: Normal work of breathing. Neurologic: No focal deficits.   Lab Results  Component Value Date   CREATININE 1.30 (H) 09/01/2019   BUN 21 09/01/2019   NA 140 09/01/2019   K 4.8 09/01/2019   CL 101 09/01/2019   CO2 25 09/01/2019   Lab Results  Component Value Date   ALT 18 09/01/2019   AST 20 09/01/2019   ALKPHOS 91 09/01/2019   BILITOT 1.1 09/01/2019    Lab Results  Component Value Date   HGBA1C 6.6 (H) 09/01/2019   HGBA1C 8.8 (H) 03/13/2019   HGBA1C (H) 04/26/2010    11.9 (NOTE)                                                                       According to the ADA Clinical Practice Recommendations for 2011, when HbA1c is used as a screening test:   >=6.5%   Diagnostic of Diabetes Mellitus           (if abnormal result  is confirmed)  5.7-6.4%   Increased risk of developing Diabetes Mellitus  References:Diagnosis and Classification of Diabetes Mellitus,Diabetes Care,2011,34(Suppl 1):S62-S69 and Standards of Medical Care in         Diabetes - 2011,Diabetes Care,2011,34  (Suppl 1):S11-S61.   Lab Results  Component Value Date   INSULIN 28.9 (H) 09/01/2019   Lab Results  Component Value Date   TSH 3.220 09/01/2019   Lab Results  Component Value Date   CHOL 155 09/01/2019   HDL 46 09/01/2019   LDLCALC 89 09/01/2019   TRIG 107 09/01/2019   CHOLHDL 3.4 09/01/2019   Lab Results  Component Value Date   WBC 8.3 09/01/2019   HGB 14.6 09/01/2019   HCT 43.8 09/01/2019   MCV 88 09/01/2019   PLT 320 09/01/2019   Attestation Statements:   Reviewed by clinician on day of visit: allergies, medications, problem list, medical history, surgical history, family history, social history, and previous encounter notes.  I, 09/03/2019, CMA, am acting as Insurance claims handler for Energy manager, DO.  I have reviewed the above documentation for accuracy and completeness, and I agree with the above. [employer], DO

## 2019-11-11 ENCOUNTER — Encounter: Payer: Self-pay | Admitting: Sports Medicine

## 2019-11-11 ENCOUNTER — Ambulatory Visit (INDEPENDENT_AMBULATORY_CARE_PROVIDER_SITE_OTHER): Payer: BC Managed Care – PPO | Admitting: Sports Medicine

## 2019-11-11 ENCOUNTER — Other Ambulatory Visit: Payer: Self-pay | Admitting: Orthopedic Surgery

## 2019-11-11 ENCOUNTER — Other Ambulatory Visit: Payer: Self-pay

## 2019-11-11 DIAGNOSIS — M25372 Other instability, left ankle: Secondary | ICD-10-CM | POA: Diagnosis not present

## 2019-11-11 DIAGNOSIS — S90822A Blister (nonthermal), left foot, initial encounter: Secondary | ICD-10-CM

## 2019-11-11 DIAGNOSIS — M14679 Charcot's joint, unspecified ankle and foot: Secondary | ICD-10-CM | POA: Diagnosis not present

## 2019-11-11 DIAGNOSIS — M25511 Pain in right shoulder: Secondary | ICD-10-CM

## 2019-11-11 DIAGNOSIS — Z89421 Acquired absence of other right toe(s): Secondary | ICD-10-CM | POA: Diagnosis not present

## 2019-11-11 DIAGNOSIS — E1140 Type 2 diabetes mellitus with diabetic neuropathy, unspecified: Secondary | ICD-10-CM | POA: Diagnosis not present

## 2019-11-11 NOTE — Progress Notes (Signed)
Subjective: 59 year old diabetic male patient seen in office for left foot check reports that he got his new brace on Friday and wore it at 11 AM and by 6 PM was having some pain over the side of the foot and immediately took the brace off and noticed that there was some drainage in the brace and rubbing.  Patient denies nausea vomiting fever chills or any other constitutional symptoms but it became concerning to him because of his previous history of amputation due to a nonhealing wound.  No other issues noted.  FBS "good" and last A1c 6.9  Patient Active Problem List   Diagnosis Date Noted  . Coronary artery disease involving native coronary artery of native heart without angina pectoris 11/03/2019  . Severe nonproliferative diabetic retinopathy of right eye, with macular edema, associated with type 2 diabetes mellitus (HCC) 07/27/2019  . Severe nonproliferative diabetic retinopathy of left eye, with macular edema, associated with type 2 diabetes mellitus (HCC) 07/27/2019  . Hypertensive retinopathy of both eyes 07/27/2019  . Cellulitis of right foot   . S/P foot surgery, right   . MSSA bacteremia   . Osteomyelitis of fifth toe of right foot (HCC) 03/13/2019  . DKA (diabetic ketoacidosis) (HCC) 03/13/2019  . Acute osteomyelitis of toe of right foot (HCC)   . Demand ischemia (HCC)   . Hyperglycemia   . Laryngopharyngeal reflux (LPR) 02/07/2016  . GERD (gastroesophageal reflux disease) 12/27/2015  . Arthritis 11/16/2015  . Diabetes mellitus (HCC) 11/16/2015  . Hyperlipidemia 11/16/2015  . Localized edema 11/16/2015  . Morbid obesity (HCC) 05/15/2007  . ADD 05/15/2007  . Obesity, diabetes, and hypertension syndrome (HCC) 05/15/2007  . Allergic rhinitis 05/15/2007  . Obstructive sleep apnea 05/15/2007  . COUGH 05/15/2007    Objective General: No acute distress  Right Lower extremity: Amp wound site healed on right,S/p 5th partial ray amputation.  Left lower extremity, preulcerative  lesion/blood blister that has been fully evacuated at the plantar lateral midfoot at fifth metatarsal base/cuboid area with no redness, no warmth, no swelling or malodor or other acute signs of infection.  Varus foot deformity on left with charcot, hammertoe, no signs of infection.  Nails x9 are well manicured patient currently going for laser and is on Lamisil with improvement.   Problem List Items Addressed This Visit      Endocrine   Diabetes mellitus (HCC)    Other Visit Diagnoses    Blister of left foot without infection, initial encounter    -  Primary   Ankle instability, left       Charcot's joint of foot, unspecified laterality       History of amputation of lesser toe of right foot (HCC)         -Patient seen and evaluated -Custom AFO brace inspected and there is a prominent area of rubbing advised patient to discontinue wearing the brace we will return the brace to our foot orthotist for him to take a look to see how we can best modify this brace to prevent rubbing -Recommend Betadine to the preulcerative lesion at bedtime and during the day when a shoe use a padded Band-Aid to protect this area from rubbing and to prevent it from becoming an ulcer; advised patient to closely monitor for any worsening signs or symptoms of infection if recurs to return to office sooner -Continue with good supportive shoes that do not rub and that provide support -Continue with antifungal treatment for nails -Continue with laser for nails -Return  to office as scheduled or sooner if problems arise.

## 2019-11-12 ENCOUNTER — Ambulatory Visit
Admission: RE | Admit: 2019-11-12 | Discharge: 2019-11-12 | Disposition: A | Discharge status: Home / Self Care | Payer: BC Managed Care – PPO | Source: Ambulatory Visit | Attending: Orthopedic Surgery | Admitting: Orthopedic Surgery

## 2019-11-12 DIAGNOSIS — M25511 Pain in right shoulder: Secondary | ICD-10-CM

## 2019-11-12 DIAGNOSIS — M19011 Primary osteoarthritis, right shoulder: Secondary | ICD-10-CM | POA: Diagnosis not present

## 2019-11-12 DIAGNOSIS — G8929 Other chronic pain: Secondary | ICD-10-CM | POA: Diagnosis not present

## 2019-11-12 DIAGNOSIS — G4733 Obstructive sleep apnea (adult) (pediatric): Secondary | ICD-10-CM | POA: Diagnosis not present

## 2019-11-16 NOTE — Patient Instructions (Signed)
DUE TO COVID-19 ONLY ONE VISITOR IS ALLOWED TO COME WITH YOU AND STAY IN THE WAITING ROOM ONLY DURING PRE OP AND PROCEDURE DAY OF SURGERY. THE 1 VISITOR  MAY VISIT WITH YOU AFTER SURGERY IN YOUR PRIVATE ROOM DURING VISITING HOURS ONLY!  YOU NEED TO HAVE A COVID 19 TEST ON__10/5_____ @__1 :05p_____, THIS TEST MUST BE DONE BEFORE SURGERY,  COVID TESTING SITE [address] [place] , IT IS ON THE RIGHT GOING OUT [address] APPROXIMATELY  2 MINUTES PAST [employer] ON THE RIGHT. ONCE YOUR COVID TEST IS COMPLETED,  PLEASE BEGIN THE QUARANTINE INSTRUCTIONS AS OUTLINED IN YOUR HANDOUT.                [id number]    Your procedure is scheduled on: 11/19/19   Report to [facility] Main  Entrance   Report to admitting at   10:15 AM     Call this number if you have problems the morning of surgery [phone]    BRUSH YOUR TEETH MORNING OF SURGERY AND RINSE YOUR MOUTH OUT, NO CHEWING GUM CANDY OR MINTS.   No food after midnight.    You may have clear liquid until 9:30 AM.    At 9:30 AM drink pre surgery drink  . Nothing by mouth after 9:30 AM.    Take these medicines the morning of surgery with A SIP OF WATER: Amlodipine, Pantoprazole use your inhaler and bring it with you.  Bring your mask and tubing with you  DO NOT TAKE ANY DIABETIC MEDICATIONS DAY OF YOUR SURGERY  How to Manage Your Diabetes Before and After Surgery  Why is it important to control my blood sugar before and after surgery? . Improving blood sugar levels before and after surgery helps healing and can limit problems. . A way of improving blood sugar control is eating a healthy diet by: o  Eating less sugar and carbohydrates o  Increasing activity/exercise o  Talking with your doctor about reaching your blood sugar goals . High blood sugars (greater than 180 mg/dL) can raise your risk of infections and slow your recovery, so you will need to focus on controlling your diabetes  during the weeks before surgery. . Make sure that the doctor who takes care of your diabetes knows about your planned surgery including the date and location.  How do I manage my blood sugar before surgery? . Check your blood sugar at least 4 times a day, starting 2 days before surgery, to make sure that the level is not too high or low. o Check your blood sugar the morning of your surgery when you wake up and every 2 hours until you get to the Short Stay unit. . If your blood sugar is less than 70 mg/dL, you will need to treat for low blood sugar: o Do not take insulin. o Treat a low blood sugar (less than 70 mg/dL) with  cup of clear juice (cranberry or apple), 4 glucose tablets, OR glucose gel. o Recheck blood sugar in 15 minutes after treatment (to make sure it is greater than 70 mg/dL). If your blood sugar is not greater than 70 mg/dL on recheck, call [phone] for further instructions. . Report your blood sugar to the short stay nurse when you get to Short Stay.  . If you are admitted to the hospital after surgery: o Your blood sugar will be checked by the staff and you will probably be given insulin after surgery (instead  of oral diabetes medicines) to make sure you have good blood sugar levels. o The goal for blood sugar control after surgery is 80-180 mg/dL.   WHAT DO I DO ABOUT MY DIABETES MEDICATION?  Marland Kitchen. Do not take oral diabetes medicines (pills) the morning of surgery.  . THE NIGHT BEFORE SURGERY, take  2   units of NPH   insulin.       . THE MORNING OF SURGERY, take 2   units ofNPH    insulin.  . The day of surgery, do not take other diabetes injectables, including Byetta (exenatide), Bydureon (exenatide ER), Victoza (liraglutide), or Trulicity (dulaglutide).  . If your CBG is greater than 220 mg/dL, you may take  of your sliding scale  . (correction) dose of insulin.                              You may not have any metal on your body including                piercings  Do not wear jewelry,  lotions, powders or deodorant                     Men may shave face and neck.   Do not bring valuables to the hospital. [facility] IS NOT             RESPONSIBLE   FOR VALUABLES.  Contacts, dentures or bridgework may not be worn into surgery.       Patients discharged the day of surgery will not be allowed to drive home.    IF YOU ARE HAVING SURGERY AND GOING HOME THE SAME DAY, YOU MUST HAVE AN ADULT TO DRIVE YOU HOME AND BE WITH YOU FOR 24 HOURS.   YOU MAY GO HOME BY TAXI OR UBER OR ORTHERWISE, BUT AN ADULT MUST ACCOMPANY YOU HOME AND STAY WITH YOU FOR 24 HOURS.  Name and phone number of your driver:  Special Instructions: N/A              Please read over the following fact sheets you were given: _____________________________________________________________________             [facility] Pre[facility]thro[facility]    Before surgery, you can play an important role. Because skin is not sterile, your skin needs to be as free of germs as possible. You can reduce the number of germs on your skin by using the following products. . Benzoyl Peroxide Gel o Reduces the number of germs present on the skin o Applied twice a day to shoulder area starting two days before surgery    ==================================================================  Please follow these instructions carefully:  BENZOYL PEROXIDE 5% GEL  Please do not use if you have an allergy to benzoyl peroxide.   If your skin becomes reddened/irritated stop using the benzoyl peroxide.  Starting two days before surgery, apply as follows: 1. Apply benzoyl peroxide in the morning and at night. Apply after taking a shower. If you are not taking a shower clean entire shoulder front, back, and side along with the armpit with a clean wet washcloth.  2. Place a quarter-sized dollop on your shoulder and rub in thoroughly, making sure to cover the front, back, and side of your  shoulder, along with the armpit.   2 days before ____ AM   ____ PM  1 day before ____ AM   ____ PM                         3. Do this twice a day for two days.  (Last application is the night before surgery, AFTER using the CHG soap as described below).  4. Do NOT apply benzoyl peroxide gel on the day of surgery.  [name] - Preparing for Surgery  Before surgery, you can play an important role.   Because skin is not sterile, your skin needs to be as free of germs as possible.   You can reduce the number of germs on your skin by washing with CHG (chlorahexidine gluconate) soap before surgery .  CHG is an antiseptic cleaner which kills germs and bonds with the skin to continue killing germs even after washing. Please DO NOT use if you have an allergy to CHG or antibacterial soaps.   If your skin becomes reddened/irritated stop using the CHG and inform your nurse when you arrive at Short Stay. .  You may shave your face/neck.  Please follow these instructions carefully:  1.  Shower with CHG Soap the night before surgery and the  morning of Surgery.  2.  If you choose to wash your hair, wash your hair first as usual with your  normal  shampoo.  3.  After you shampoo, rinse your hair and body thoroughly to remove the  shampoo.                                        4.  Use CHG as you would any other liquid soap.  You can apply chg directly  to the skin and wash                       Gently with a scrungie or clean washcloth.  5.  Apply the CHG Soap to your body ONLY FROM THE NECK DOWN.   Do not use on face/ open                           Wound or open sores. Avoid contact with eyes, ears mouth and genitals (private parts).                       Wash face,  Genitals (private parts) with your normal soap.             6.  Wash thoroughly, paying special attention to the area where your surgery  will be performed.  7.  Thoroughly rinse your body with warm water from the neck  down.  8.  DO NOT shower/wash with your normal soap after using and rinsing off  the CHG Soap.             9.  Pat yourself dry with a clean towel.            10.  Wear clean pajamas.            11.  Place clean sheets on your bed the night of your first shower and do not  sleep with pets. Day of Surgery : Do not apply any lotions/deodorants the morning of surgery.  Please wear clean clothes to the hospital/surgery center.  FAILURE TO FOLLOW THESE INSTRUCTIONS MAY RESULT  IN THE CANCELLATION OF YOUR SURGERY PATIENT SIGNATURE_________________________________  NURSE SIGNATURE__________________________________  ________________________________________________________________________   [name]  An incentive spirometer is a tool that can help keep your lungs clear and active. This tool measures how well you are filling your lungs with each breath. Taking long deep breaths may help reverse or decrease the chance of developing breathing (pulmonary) problems (especially infection) following:  A long period of time when you are unable to move or be active. BEFORE THE PROCEDURE   If the spirometer includes an indicator to show your best effort, your nurse or respiratory therapist will set it to a desired goal.  If possible, sit up straight or lean slightly forward. Try not to slouch.  Hold the incentive spirometer in an upright position. INSTRUCTIONS FOR USE  1. Sit on the edge of your bed if possible, or sit up as far as you can in bed or on a chair. 2. Hold the incentive spirometer in an upright position. 3. Breathe out normally. 4. Place the mouthpiece in your mouth and seal your lips tightly around it. 5. Breathe in slowly and as deeply as possible, raising the piston or the ball toward the top of the column. 6. Hold your breath for 3-5 seconds or for as long as possible. Allow the piston or ball to fall to the bottom of the column. 7. Remove the mouthpiece from your mouth  and breathe out normally. 8. Rest for a few seconds and repeat Steps 1 through 7 at least 10 times every 1-2 hours when you are awake. Take your time and take a few normal breaths between deep breaths. 9. The spirometer may include an indicator to show your best effort. Use the indicator as a goal to work toward during each repetition. 10. After each set of 10 deep breaths, practice coughing to be sure your lungs are clear. If you have an incision (the cut made at the time of surgery), support your incision when coughing by placing a pillow or rolled up towels firmly against it. Once you are able to get out of bed, walk around indoors and cough well. You may stop using the incentive spirometer when instructed by your caregiver.  RISKS AND COMPLICATIONS  Take your time so you do not get dizzy or light-headed.  If you are in pain, you may need to take or ask for pain medication before doing incentive spirometry. It is harder to take a deep breath if you are having pain. AFTER USE  Rest and breathe slowly and easily.  It can be helpful to keep track of a log of your progress. Your caregiver can provide you with a simple table to help with this. If you are using the spirometer at home, follow these instructions: SEEK MEDICAL CARE IF:   You are having difficultly using the spirometer.  You have trouble using the spirometer as often as instructed.  Your pain medication is not giving enough relief while using the spirometer.  You develop fever of 100.5 F (38.1 C) or higher. SEEK IMMEDIATE MEDICAL CARE IF:   You cough up bloody sputum that had not been present before.  You develop fever of 102 F (38.9 C) or greater.  You develop worsening pain at or near the incision site. MAKE SURE YOU:   Understand these instructions.  Will watch your condition.  Will get help right away if you are not doing well or get worse. Document Released: 06/11/2006 Document Revised: 04/23/2011 Document  Reviewed: 08/12/2006 ExitCare  Patient Information [address], [place].   ________________________________________________________________________

## 2019-11-17 ENCOUNTER — Other Ambulatory Visit: Payer: Self-pay

## 2019-11-17 ENCOUNTER — Encounter (HOSPITAL_COMMUNITY)
Admission: RE | Admit: 2019-11-17 | Discharge: 2019-11-17 | Disposition: A | Discharge status: Home / Self Care | Payer: BC Managed Care – PPO | Source: Ambulatory Visit | Attending: Orthopedic Surgery | Admitting: Orthopedic Surgery

## 2019-11-17 ENCOUNTER — Other Ambulatory Visit (HOSPITAL_COMMUNITY)
Admission: RE | Admit: 2019-11-17 | Discharge: 2019-11-17 | Disposition: A | Discharge status: Home / Self Care | Payer: BC Managed Care – PPO | Source: Ambulatory Visit | Attending: Orthopedic Surgery | Admitting: Orthopedic Surgery

## 2019-11-17 ENCOUNTER — Encounter (HOSPITAL_COMMUNITY): Payer: Self-pay

## 2019-11-17 ENCOUNTER — Ambulatory Visit (HOSPITAL_COMMUNITY)
Admission: RE | Admit: 2019-11-17 | Discharge: 2019-11-17 | Disposition: A | Discharge status: Home / Self Care | Payer: BC Managed Care – PPO | Source: Ambulatory Visit | Attending: Orthopedic Surgery | Admitting: Orthopedic Surgery

## 2019-11-17 DIAGNOSIS — I252 Old myocardial infarction: Secondary | ICD-10-CM | POA: Diagnosis not present

## 2019-11-17 DIAGNOSIS — M19011 Primary osteoarthritis, right shoulder: Secondary | ICD-10-CM | POA: Diagnosis not present

## 2019-11-17 DIAGNOSIS — G4730 Sleep apnea, unspecified: Secondary | ICD-10-CM | POA: Diagnosis not present

## 2019-11-17 DIAGNOSIS — E11319 Type 2 diabetes mellitus with unspecified diabetic retinopathy without macular edema: Secondary | ICD-10-CM | POA: Insufficient documentation

## 2019-11-17 DIAGNOSIS — Z01818 Encounter for other preprocedural examination: Secondary | ICD-10-CM | POA: Diagnosis not present

## 2019-11-17 DIAGNOSIS — Z7982 Long term (current) use of aspirin: Secondary | ICD-10-CM | POA: Insufficient documentation

## 2019-11-17 DIAGNOSIS — Z01811 Encounter for preprocedural respiratory examination: Secondary | ICD-10-CM

## 2019-11-17 DIAGNOSIS — E1140 Type 2 diabetes mellitus with diabetic neuropathy, unspecified: Secondary | ICD-10-CM | POA: Diagnosis not present

## 2019-11-17 DIAGNOSIS — Z89421 Acquired absence of other right toe(s): Secondary | ICD-10-CM | POA: Diagnosis not present

## 2019-11-17 DIAGNOSIS — E785 Hyperlipidemia, unspecified: Secondary | ICD-10-CM | POA: Insufficient documentation

## 2019-11-17 DIAGNOSIS — Z79899 Other long term (current) drug therapy: Secondary | ICD-10-CM | POA: Insufficient documentation

## 2019-11-17 DIAGNOSIS — Z7984 Long term (current) use of oral hypoglycemic drugs: Secondary | ICD-10-CM | POA: Diagnosis not present

## 2019-11-17 DIAGNOSIS — Z955 Presence of coronary angioplasty implant and graft: Secondary | ICD-10-CM | POA: Diagnosis not present

## 2019-11-17 DIAGNOSIS — Z794 Long term (current) use of insulin: Secondary | ICD-10-CM | POA: Diagnosis not present

## 2019-11-17 DIAGNOSIS — Z7722 Contact with and (suspected) exposure to environmental tobacco smoke (acute) (chronic): Secondary | ICD-10-CM | POA: Diagnosis not present

## 2019-11-17 DIAGNOSIS — Z7901 Long term (current) use of anticoagulants: Secondary | ICD-10-CM | POA: Diagnosis not present

## 2019-11-17 DIAGNOSIS — Z6837 Body mass index (BMI) 37.0-37.9, adult: Secondary | ICD-10-CM | POA: Diagnosis not present

## 2019-11-17 DIAGNOSIS — K219 Gastro-esophageal reflux disease without esophagitis: Secondary | ICD-10-CM | POA: Diagnosis not present

## 2019-11-17 DIAGNOSIS — E538 Deficiency of other specified B group vitamins: Secondary | ICD-10-CM | POA: Insufficient documentation

## 2019-11-17 DIAGNOSIS — F988 Other specified behavioral and emotional disorders with onset usually occurring in childhood and adolescence: Secondary | ICD-10-CM | POA: Diagnosis not present

## 2019-11-17 DIAGNOSIS — Z20822 Contact with and (suspected) exposure to covid-19: Secondary | ICD-10-CM | POA: Diagnosis not present

## 2019-11-17 DIAGNOSIS — I2510 Atherosclerotic heart disease of native coronary artery without angina pectoris: Secondary | ICD-10-CM | POA: Insufficient documentation

## 2019-11-17 DIAGNOSIS — I10 Essential (primary) hypertension: Secondary | ICD-10-CM | POA: Diagnosis not present

## 2019-11-17 HISTORY — DX: Gastro-esophageal reflux disease without esophagitis: K21.9

## 2019-11-17 HISTORY — DX: Atherosclerotic heart disease of native coronary artery without angina pectoris: I25.10

## 2019-11-17 LAB — COMPREHENSIVE METABOLIC PANEL
ALT: 22 U/L (ref 0–44)
AST: 21 U/L (ref 15–41)
Albumin: 4.1 g/dL (ref 3.5–5.0)
Alkaline Phosphatase: 64 U/L (ref 38–126)
Anion gap: 12 (ref 5–15)
BUN: 22 mg/dL — ABNORMAL HIGH (ref 6–20)
CO2: 24 mmol/L (ref 22–32)
Calcium: 9.3 mg/dL (ref 8.9–10.3)
Chloride: 101 mmol/L (ref 98–111)
Creatinine, Ser: 1.51 mg/dL — ABNORMAL HIGH (ref 0.61–1.24)
GFR calc non Af Amer: 51 mL/min — ABNORMAL LOW (ref >60)
Glucose, Bld: 156 mg/dL — ABNORMAL HIGH (ref 70–99)
Potassium: 4.4 mmol/L (ref 3.5–5.1)
Sodium: 137 mmol/L (ref 135–145)
Total Bilirubin: 0.9 mg/dL (ref 0.3–1.2)
Total Protein: 7.2 g/dL (ref 6.5–8.1)

## 2019-11-17 LAB — CBC WITH DIFFERENTIAL/PLATELET
Abs Immature Granulocytes: 0.02 10*3/uL (ref 0.00–0.07)
Basophils Absolute: 0.1 10*3/uL (ref 0.0–0.1)
Basophils Relative: 1 %
Eosinophils Absolute: 0.2 10*3/uL (ref 0.0–0.5)
Eosinophils Relative: 3 %
HCT: 42.3 % (ref 39.0–52.0)
Hemoglobin: 14.2 g/dL (ref 13.0–17.0)
Immature Granulocytes: 0 %
Lymphocytes Relative: 23 %
Lymphs Abs: 1.6 10*3/uL (ref 0.7–4.0)
MCH: 30 pg (ref 26.0–34.0)
MCHC: 33.6 g/dL (ref 30.0–36.0)
MCV: 89.4 fL (ref 80.0–100.0)
Monocytes Absolute: 0.6 10*3/uL (ref 0.1–1.0)
Monocytes Relative: 9 %
Neutro Abs: 4.7 10*3/uL (ref 1.7–7.7)
Neutrophils Relative %: 64 %
Platelets: 284 10*3/uL (ref 150–400)
RBC: 4.73 MIL/uL (ref 4.22–5.81)
RDW: 13.2 % (ref 11.5–15.5)
WBC: 7.3 10*3/uL (ref 4.0–10.5)
nRBC: 0 % (ref 0.0–0.2)

## 2019-11-17 LAB — URINALYSIS, ROUTINE W REFLEX MICROSCOPIC
Bacteria, UA: NONE SEEN
Bilirubin Urine: NEGATIVE
Glucose, UA: NEGATIVE mg/dL
Ketones, ur: 5 mg/dL — AB
Leukocytes,Ua: NEGATIVE
Nitrite: NEGATIVE
Protein, ur: 100 mg/dL — AB
Specific Gravity, Urine: 1.021 (ref 1.005–1.030)
pH: 5 (ref 5.0–8.0)

## 2019-11-17 LAB — SURGICAL PCR SCREEN
MRSA, PCR: NEGATIVE
Staphylococcus aureus: NEGATIVE

## 2019-11-17 LAB — GLUCOSE, CAPILLARY: Glucose-Capillary: 150 mg/dL — ABNORMAL HIGH (ref 70–99)

## 2019-11-17 LAB — SARS CORONAVIRUS 2 (TAT 6-24 HRS): SARS Coronavirus 2: NEGATIVE

## 2019-11-17 LAB — HEMOGLOBIN A1C
Hgb A1c MFr Bld: 5.4 % (ref 4.8–5.6)
Mean Plasma Glucose: 108.28 mg/dL

## 2019-11-17 LAB — PROTIME-INR
INR: 1 (ref 0.8–1.2)
Prothrombin Time: 12.9 seconds (ref 11.4–15.2)

## 2019-11-17 LAB — APTT: aPTT: 31 seconds (ref 24–36)

## 2019-11-17 NOTE — Progress Notes (Signed)
COVID Vaccine Completed:Yes Date COVID Vaccine completed:06/02/19 COVID vaccine manufacturer:  Moderna     PCP - Dr. Wende Neighbors Cardiologist - Dr. Merlyn Lot  Chest x-ray - no EKG - 11/11/19 Stress Test - no ECHO - 03/17/19 Cardiac Cath - with stent 2012 Pacemaker/ICD device last checked:NA  Sleep Study - yes CPAP - yes  Fasting Blood Sugar - 110-140 Checks Blood Sugar _BID____ times a day  Blood Thinner Instructions:ASA/ Gates Aspirin Instructions:Stop 4 days prior to DOS/ Ave Filter Last Dose:11/16/19  Anesthesia review:   Patient denies shortness of breath, fever, cough and chest pain at PAT appointment yes   Patient verbalized understanding of instructions that were given to them at the PAT appointment. Patient was also instructed that they will need to review over the PAT instructions again at home before surgery. Yes Pt has an active lifestyle and is loosing weight. He has no SOB climbing stairs, working or with ADLs

## 2019-11-18 ENCOUNTER — Encounter (INDEPENDENT_AMBULATORY_CARE_PROVIDER_SITE_OTHER): Payer: Self-pay | Admitting: Family Medicine

## 2019-11-18 ENCOUNTER — Ambulatory Visit (INDEPENDENT_AMBULATORY_CARE_PROVIDER_SITE_OTHER): Payer: BC Managed Care – PPO | Admitting: Family Medicine

## 2019-11-18 VITALS — BP 135/73 | HR 65 | Temp 97.9°F | Ht 71.0 in | Wt 269.0 lb

## 2019-11-18 DIAGNOSIS — Z9189 Other specified personal risk factors, not elsewhere classified: Secondary | ICD-10-CM

## 2019-11-18 DIAGNOSIS — Z794 Long term (current) use of insulin: Secondary | ICD-10-CM

## 2019-11-18 DIAGNOSIS — E559 Vitamin D deficiency, unspecified: Secondary | ICD-10-CM | POA: Diagnosis not present

## 2019-11-18 DIAGNOSIS — E1169 Type 2 diabetes mellitus with other specified complication: Secondary | ICD-10-CM | POA: Diagnosis not present

## 2019-11-18 DIAGNOSIS — Z6837 Body mass index (BMI) 37.0-37.9, adult: Secondary | ICD-10-CM

## 2019-11-18 MED ORDER — DEXTROSE 5 % IV SOLN
3.0000 g | INTRAVENOUS | Status: AC
  Administered 2019-11-19: 3 g via INTRAVENOUS
  Filled 2019-11-18: qty 3

## 2019-11-18 NOTE — Progress Notes (Signed)
Anesthesia Chart Review   Case: [id number] Date/Time: 11/19/19 1232   Procedure: TOTAL SHOULDER ARTHROPLASTY (Right Shoulder)   Anesthesia type: Choice   Pre-op diagnosis: RIGHT SHOULDER OSTEOARTHRITIS   Location: [facility] ROOM 08 / [facility]   Surgeons: [name], MD      DISCUSSION:59 y.o. never smoker, DM II, HTN, sleep apnea, GERD, CAD (NSTEMI, stent 2012), renal insufficiency, right shoulder OA scheduled for above procedure 11/19/19 with Dr. [name].   Per cardiology OV note 11/03/19,  "Preoperative Risk Assessment - The Revised Cardiac Risk Index =2(prior CAD and Insulin use)for shoulder surgery, low risk; 2=6.6%: estimated risk of perioperative myocardial infarction, pulmonary edema, ventricular fibrillation, cardiac arrest, or complete heart block.  - Duke Activity Status Index : 6.91 METS; 34 points; can walke 1-2 blocks on level ground and walk up a flight of stairs. - No further cardiac testing is recommended prior to surgery.  - The patient may proceed to surgery at acceptable risk.  - Our service is available as needed in the peri-operative period."  Anticipate pt can proceed with planned procedure barring acute status change.   VS: BP (!) 152/74   Pulse 65   Temp 37.1 C (Oral)   Resp 20   Ht 5\' 11"  (1.803 m)   Wt 123.8 kg   SpO2 97%   BMI 38.08 kg/m   PROVIDERS: , MD  Marden Noble, MD is Cardiologist  LABS: Labs reviewed: Acceptable for surgery. (all labs ordered are listed, but only abnormal results are displayed)  Labs Reviewed  COMPREHENSIVE METABOLIC PANEL - Abnormal; Notable for the following components:      Result Value   Glucose, Bld 156 (*)    BUN 22 (*)    Creatinine, Ser 1.51 (*)    GFR calc non Af Amer 51 (*)    All other components within normal limits  URINALYSIS, ROUTINE W REFLEX MICROSCOPIC - Abnormal; Notable for the following components:   Hgb urine dipstick SMALL (*)    Ketones, ur 5 (*)    Protein, ur  100 (*)    All other components within normal limits  GLUCOSE, CAPILLARY - Abnormal; Notable for the following components:   Glucose-Capillary 150 (*)    All other components within normal limits  SURGICAL PCR SCREEN  HEMOGLOBIN A1C  APTT  CBC WITH DIFFERENTIAL/PLATELET  PROTIME-INR  TYPE AND SCREEN     IMAGES:   EKG: 09/01/19 Rate 76 bpm  NSR  CV: Echo 03/17/19 IMPRESSIONS    1. Left ventricular ejection fraction, by visual estimation, is 60 to  65%. The left ventricle has normal function. Left ventricular septal wall  thickness was normal. Normal left ventricular posterior wall thickness.  There is no left ventricular  hypertrophy.  2. The left ventricle has no regional wall motion abnormalities.  3. Global right ventricle has normal systolic function.The right  ventricular size is normal. No increase in right ventricular wall  thickness.  4. Left atrial size was normal.  5. Right atrial size was normal.  6. The mitral valve is normal in structure. Mild mitral valve  regurgitation. No evidence of mitral stenosis.  7. No trisuspid valve vegetation visualized.  8. The tricuspid valve is normal in structure.  9. The tricuspid valve is normal in structure. Tricuspid valve  regurgitation is not demonstrated.  10. The aortic valve is normal in structure. Aortic valve regurgitation is  not visualized. No evidence of aortic valve sclerosis or stenosis.  11. The pulmonic valve  was normal in structure. Pulmonic valve  regurgitation is not visualized.  12. The inferior vena cava is normal in size with greater than 50%  respiratory variability, suggesting right atrial pressure of 3 mmHg. Past Medical History:  Diagnosis Date  . ADD (attention deficit disorder)   . Anginal pain (HCC) 2012  . Arthritis, shoulder region   . B12 deficiency   . Back pain   . Coronary artery disease   . Diabetes (HCC)   . Diabetic retinopathy (HCC)   . GERD (gastroesophageal reflux  disease)   . H/O heart artery stent 04/2010  . Hyperlipidemia   . Hypertension   . Joint pain   . Knee pain   . Neuropathy   . NSTEMI (non-ST elevated myocardial infarction) (HCC)   . Obesity   . Osteoarthritis   . Persistent cough    stopped for a year  . Sleep apnea   . Swallowing difficulty   . Swelling of both lower extremities     Past Surgical History:  Procedure Laterality Date  . ACHILLES TENDON REPAIR Right   . AMPUTATION TOE Right 03/14/2019   Procedure: AMPUTATION TOE, 5th toe;  Surgeon: [name], DPM;  Location: [facility] OR;  Service: Podiatry;  Laterality: Right;  . heart stent  2012   x1  . INCISION AND DRAINAGE Right 03/14/2019   Procedure: INCISION AND DRAINAGE;  Surgeon: [name], DPM;  Location: [facility] OR;  Service: Podiatry;  Laterality: Right;  . SHOULDER ARTHROSCOPY Right   . TEE WITHOUT CARDIOVERSION N/A 03/17/2019   Procedure: TRANSESOPHAGEAL ECHOCARDIOGRAM (TEE);  Surgeon: Chilton Si, MD;  Location: [facility] ENDOSCOPY;  Service: Cardiovascular;  Laterality: N/A;  . TOE SURGERY Right 06/2018   for a hammer toe  . UVULOPALATOPHARYNGOPLASTY      MEDICATIONS: . albuterol (VENTOLIN HFA) 108 (90 Base) MCG/ACT inhaler  . amLODipine (NORVASC) 5 MG tablet  . aspirin EC 81 MG tablet  . atorvastatin (LIPITOR) 10 MG tablet  . BD INSULIN SYRINGE U/F 31G X 5/16" 1 ML MISC  . cetirizine (ZYRTEC) 10 MG tablet  . Cholecalciferol (VITAMIN D) 50 MCG (2000 UT) tablet  . cloNIDine (CATAPRES) 0.2 MG tablet  . glimepiride (AMARYL) 4 MG tablet  . loratadine (CLARITIN) 10 MG tablet  . Magnesium 400 MG CAPS  . metFORMIN (GLUCOPHAGE-XR) 500 MG 24 hr tablet  . pantoprazole (PROTONIX) 40 MG tablet  . Spacer/Aero-Holding Chambers (AEROCHAMBER MV) inhaler  . terbinafine (LAMISIL) 250 MG tablet  . terbinafine (LAMISIL) 250 MG tablet  . vitamin B-12 (CYANOCOBALAMIN) 500 MCG tablet  . Vitamin D, Ergocalciferol, (DRISDOL) 1.25 MG (50000 UNIT) CAPS capsule   No current  facility-administered medications for this encounter.   Melene Muller ON 11/19/2019] ceFAZolin (ANCEF) 3 g in dextrose 5 % 50 mL IVPB    Jodell Cipro, PA-C [facility] Pre-Surgical Testing [phone]

## 2019-11-18 NOTE — Anesthesia Preprocedure Evaluation (Addendum)
Anesthesia Evaluation  Patient identified by MRN, date of birth, ID band Patient awake    Reviewed: Allergy & Precautions, NPO status , Patient's Chart, lab work & pertinent test results  Airway Mallampati: III  TM Distance: >3 FB Neck ROM: Full   Comment: Large neck circumference Dental no notable dental hx.    Pulmonary sleep apnea and Continuous Positive Airway Pressure Ventilation ,    Pulmonary exam normal breath sounds clear to auscultation       Cardiovascular Exercise Tolerance: Good METS: 5 - 7 Mets hypertension, + angina + Past MI (h/o NSTEMI) and + Cardiac Stents  Normal cardiovascular exam Rhythm:Regular Rate:Normal    1. Left ventricular ejection fraction, by visual estimation, is 60 to  65%. The left ventricle has normal function. Left ventricular septal wall  thickness was normal. Normal left ventricular posterior wall thickness.  There is no left ventricular  hypertrophy.  2. The left ventricle has no regional wall motion abnormalities.  3. Global right ventricle has normal systolic function.The right  ventricular size is normal. No increase in right ventricular wall  thickness.  4. Left atrial size was normal.  5. Right atrial size was normal.  6. The mitral valve is normal in structure. Mild mitral valve  regurgitation. No evidence of mitral stenosis.  7. No trisuspid valve vegetation visualized.  8. The tricuspid valve is normal in structure.  9. The tricuspid valve is normal in structure. Tricuspid valve  regurgitation is not demonstrated.  10. The aortic valve is normal in structure. Aortic valve regurgitation is  not visualized. No evidence of aortic valve sclerosis or stenosis.  11. The pulmonic valve was normal in structure. Pulmonic valve  regurgitation is not visualized.  12. The inferior vena cava is normal in size with greater than 50%  respiratory variability, suggesting right atrial  pressure of 3 mmHg.   Neuro/Psych PSYCHIATRIC DISORDERS    GI/Hepatic GERD  ,  Endo/Other  diabetes, Type 2, Oral Hypoglycemic AgentsMorbid obesity  Renal/GU      Musculoskeletal  (+) Arthritis , Osteoarthritis,    Abdominal (+) + obese,   Peds  Hematology   Anesthesia Other Findings   Reproductive/Obstetrics                           Anesthesia Physical Anesthesia Plan  ASA: III  Anesthesia Plan: Regional and General   Post-op Pain Management: GA combined w/ Regional for post-op pain   Induction: Intravenous  PONV Risk Score and Plan: 2 and Ondansetron, Dexamethasone and Midazolam  Airway Management Planned: Oral ETT and Video Laryngoscope Planned  Additional Equipment:   Intra-op Plan:   Post-operative Plan: Extubation in OR  Informed Consent: I have reviewed the patients History and Physical, chart, labs and discussed the procedure including the risks, benefits and alternatives for the proposed anesthesia with the patient or authorized representative who has indicated his/her understanding and acceptance.       Plan Discussed with: Anesthesiologist and CRNA  Anesthesia Plan Comments: (Interscalene block with exparel. GETA. )      Anesthesia Quick Evaluation

## 2019-11-19 ENCOUNTER — Encounter (HOSPITAL_COMMUNITY): Payer: Self-pay | Admitting: Orthopedic Surgery

## 2019-11-19 ENCOUNTER — Ambulatory Visit (HOSPITAL_COMMUNITY): Payer: BC Managed Care – PPO | Admitting: Certified Registered Nurse Anesthetist

## 2019-11-19 ENCOUNTER — Encounter: Payer: Self-pay | Admitting: Sports Medicine

## 2019-11-19 ENCOUNTER — Encounter (INDEPENDENT_AMBULATORY_CARE_PROVIDER_SITE_OTHER): Payer: Self-pay | Admitting: Family Medicine

## 2019-11-19 ENCOUNTER — Other Ambulatory Visit: Payer: Self-pay

## 2019-11-19 ENCOUNTER — Encounter (HOSPITAL_COMMUNITY)
Admission: RE | Disposition: A | Discharge status: Home / Self Care | Payer: Self-pay | Source: Home / Self Care | Attending: Orthopedic Surgery

## 2019-11-19 ENCOUNTER — Ambulatory Visit (HOSPITAL_COMMUNITY)
Admission: RE | Admit: 2019-11-19 | Discharge: 2019-11-19 | Disposition: A | Discharge status: Home / Self Care | Payer: BC Managed Care – PPO | Attending: Orthopedic Surgery | Admitting: Orthopedic Surgery

## 2019-11-19 ENCOUNTER — Ambulatory Visit (HOSPITAL_COMMUNITY): Payer: BC Managed Care – PPO

## 2019-11-19 DIAGNOSIS — M19011 Primary osteoarthritis, right shoulder: Secondary | ICD-10-CM | POA: Insufficient documentation

## 2019-11-19 DIAGNOSIS — Z7722 Contact with and (suspected) exposure to environmental tobacco smoke (acute) (chronic): Secondary | ICD-10-CM | POA: Insufficient documentation

## 2019-11-19 DIAGNOSIS — I10 Essential (primary) hypertension: Secondary | ICD-10-CM | POA: Diagnosis not present

## 2019-11-19 DIAGNOSIS — I252 Old myocardial infarction: Secondary | ICD-10-CM | POA: Diagnosis not present

## 2019-11-19 DIAGNOSIS — G8918 Other acute postprocedural pain: Secondary | ICD-10-CM | POA: Diagnosis not present

## 2019-11-19 DIAGNOSIS — Z7982 Long term (current) use of aspirin: Secondary | ICD-10-CM | POA: Insufficient documentation

## 2019-11-19 DIAGNOSIS — E538 Deficiency of other specified B group vitamins: Secondary | ICD-10-CM | POA: Diagnosis not present

## 2019-11-19 DIAGNOSIS — E785 Hyperlipidemia, unspecified: Secondary | ICD-10-CM | POA: Diagnosis not present

## 2019-11-19 DIAGNOSIS — Z89421 Acquired absence of other right toe(s): Secondary | ICD-10-CM | POA: Insufficient documentation

## 2019-11-19 DIAGNOSIS — E11319 Type 2 diabetes mellitus with unspecified diabetic retinopathy without macular edema: Secondary | ICD-10-CM | POA: Diagnosis not present

## 2019-11-19 DIAGNOSIS — F988 Other specified behavioral and emotional disorders with onset usually occurring in childhood and adolescence: Secondary | ICD-10-CM | POA: Insufficient documentation

## 2019-11-19 DIAGNOSIS — Z6837 Body mass index (BMI) 37.0-37.9, adult: Secondary | ICD-10-CM | POA: Diagnosis not present

## 2019-11-19 DIAGNOSIS — Z96611 Presence of right artificial shoulder joint: Secondary | ICD-10-CM | POA: Diagnosis not present

## 2019-11-19 DIAGNOSIS — Z794 Long term (current) use of insulin: Secondary | ICD-10-CM | POA: Diagnosis not present

## 2019-11-19 DIAGNOSIS — E1110 Type 2 diabetes mellitus with ketoacidosis without coma: Secondary | ICD-10-CM | POA: Diagnosis not present

## 2019-11-19 DIAGNOSIS — Z7984 Long term (current) use of oral hypoglycemic drugs: Secondary | ICD-10-CM | POA: Diagnosis not present

## 2019-11-19 DIAGNOSIS — G4730 Sleep apnea, unspecified: Secondary | ICD-10-CM | POA: Insufficient documentation

## 2019-11-19 DIAGNOSIS — Z471 Aftercare following joint replacement surgery: Secondary | ICD-10-CM | POA: Diagnosis not present

## 2019-11-19 DIAGNOSIS — K219 Gastro-esophageal reflux disease without esophagitis: Secondary | ICD-10-CM | POA: Diagnosis not present

## 2019-11-19 DIAGNOSIS — Z955 Presence of coronary angioplasty implant and graft: Secondary | ICD-10-CM | POA: Diagnosis not present

## 2019-11-19 DIAGNOSIS — Z79899 Other long term (current) drug therapy: Secondary | ICD-10-CM | POA: Insufficient documentation

## 2019-11-19 DIAGNOSIS — I2510 Atherosclerotic heart disease of native coronary artery without angina pectoris: Secondary | ICD-10-CM | POA: Diagnosis not present

## 2019-11-19 DIAGNOSIS — E1140 Type 2 diabetes mellitus with diabetic neuropathy, unspecified: Secondary | ICD-10-CM | POA: Insufficient documentation

## 2019-11-19 HISTORY — PX: TOTAL SHOULDER ARTHROPLASTY: SHX126

## 2019-11-19 LAB — TYPE AND SCREEN
ABO/RH(D): O POS
Antibody Screen: NEGATIVE

## 2019-11-19 LAB — GLUCOSE, CAPILLARY
Glucose-Capillary: 110 mg/dL — ABNORMAL HIGH (ref 70–99)
Glucose-Capillary: 64 mg/dL — ABNORMAL LOW (ref 70–99)
Glucose-Capillary: 63 mg/dL — ABNORMAL LOW (ref 70–99)
Glucose-Capillary: 69 mg/dL — ABNORMAL LOW (ref 70–99)
Glucose-Capillary: 71 mg/dL (ref 70–99)
Glucose-Capillary: 99 mg/dL (ref 70–99)

## 2019-11-19 LAB — ABO/RH: ABO/RH(D): O POS

## 2019-11-19 SURGERY — ARTHROPLASTY, SHOULDER, TOTAL
Anesthesia: Regional | Site: Shoulder | Laterality: Right

## 2019-11-19 MED ORDER — ONDANSETRON HCL 4 MG/2ML IJ SOLN
INTRAMUSCULAR | Status: AC
  Filled 2019-11-19: qty 2

## 2019-11-19 MED ORDER — DEXAMETHASONE SODIUM PHOSPHATE 10 MG/ML IJ SOLN
INTRAMUSCULAR | Status: DC | PRN
  Administered 2019-11-19: 8 mg via INTRAVENOUS

## 2019-11-19 MED ORDER — DEXAMETHASONE SODIUM PHOSPHATE 10 MG/ML IJ SOLN
INTRAMUSCULAR | Status: AC
  Filled 2019-11-19: qty 1

## 2019-11-19 MED ORDER — LIDOCAINE 2% (20 MG/ML) 5 ML SYRINGE
INTRAMUSCULAR | Status: AC
  Filled 2019-11-19: qty 5

## 2019-11-19 MED ORDER — SUGAMMADEX SODIUM 500 MG/5ML IV SOLN
INTRAVENOUS | Status: AC
  Filled 2019-11-19: qty 5

## 2019-11-19 MED ORDER — 0.9 % SODIUM CHLORIDE (POUR BTL) OPTIME
TOPICAL | Status: DC | PRN
  Administered 2019-11-19: 1000 mL

## 2019-11-19 MED ORDER — ORAL CARE MOUTH RINSE: 15.0000 mL | Freq: Once | OROMUCOSAL | Status: AC

## 2019-11-19 MED ORDER — TRANEXAMIC ACID-NACL 1000-0.7 MG/100ML-% IV SOLN
1000.0000 mg | INTRAVENOUS | Status: AC
  Administered 2019-11-19: 1000 mg via INTRAVENOUS
  Filled 2019-11-19: qty 100

## 2019-11-19 MED ORDER — FENTANYL CITRATE (PF) 100 MCG/2ML IJ SOLN
50.0000 ug | Freq: Once | INTRAMUSCULAR | Status: AC
  Administered 2019-11-19: 50 ug via INTRAVENOUS
  Filled 2019-11-19: qty 2

## 2019-11-19 MED ORDER — MIDAZOLAM HCL 2 MG/2ML IJ SOLN
1.0000 mg | Freq: Once | INTRAMUSCULAR | Status: AC
  Administered 2019-11-19: 2 mg via INTRAVENOUS
  Filled 2019-11-19: qty 2

## 2019-11-19 MED ORDER — CHLORHEXIDINE GLUCONATE 0.12 % MT SOLN
15.0000 mL | Freq: Once | OROMUCOSAL | Status: AC
  Administered 2019-11-19: 15 mL via OROMUCOSAL

## 2019-11-19 MED ORDER — WATER FOR IRRIGATION, STERILE IR SOLN
Status: DC | PRN
  Administered 2019-11-19: 1000 mL

## 2019-11-19 MED ORDER — LIDOCAINE 2% (20 MG/ML) 5 ML SYRINGE
INTRAMUSCULAR | Status: DC | PRN
  Administered 2019-11-19: 60 mg via INTRAVENOUS

## 2019-11-19 MED ORDER — LACTATED RINGERS IV SOLN: INTRAVENOUS | Status: DC

## 2019-11-19 MED ORDER — TIZANIDINE HCL 4 MG PO TABS: 4.0000 mg | ORAL_TABLET | Freq: Three times a day (TID) | ORAL | 1 refills | Status: DC | PRN

## 2019-11-19 MED ORDER — PROPOFOL 10 MG/ML IV BOLUS
INTRAVENOUS | Status: DC | PRN
  Administered 2019-11-19: 200 mg via INTRAVENOUS
  Administered 2019-11-19: 50 mg via INTRAVENOUS

## 2019-11-19 MED ORDER — PHENYLEPHRINE 40 MCG/ML (10ML) SYRINGE FOR IV PUSH (FOR BLOOD PRESSURE SUPPORT)
PREFILLED_SYRINGE | INTRAVENOUS | Status: DC | PRN
  Administered 2019-11-19: 80 ug via INTRAVENOUS

## 2019-11-19 MED ORDER — BUPIVACAINE LIPOSOME 1.3 % IJ SUSP
INTRAMUSCULAR | Status: DC | PRN
  Administered 2019-11-19: 10 mL

## 2019-11-19 MED ORDER — ROCURONIUM BROMIDE 10 MG/ML (PF) SYRINGE
PREFILLED_SYRINGE | INTRAVENOUS | Status: DC | PRN
  Administered 2019-11-19: 80 mg via INTRAVENOUS

## 2019-11-19 MED ORDER — PHENYLEPHRINE HCL-NACL 10-0.9 MG/250ML-% IV SOLN
INTRAVENOUS | Status: DC | PRN
  Administered 2019-11-19: 25 ug/min via INTRAVENOUS

## 2019-11-19 MED ORDER — FENTANYL CITRATE (PF) 100 MCG/2ML IJ SOLN
INTRAMUSCULAR | Status: DC | PRN
  Administered 2019-11-19: 100 ug via INTRAVENOUS

## 2019-11-19 MED ORDER — OXYCODONE-ACETAMINOPHEN 5-325 MG PO TABS: ORAL_TABLET | ORAL | 0 refills | Status: DC

## 2019-11-19 MED ORDER — SUGAMMADEX SODIUM 500 MG/5ML IV SOLN
INTRAVENOUS | Status: DC | PRN
  Administered 2019-11-19: 250 mg via INTRAVENOUS

## 2019-11-19 MED ORDER — ACETAMINOPHEN 500 MG PO TABS
1000.0000 mg | ORAL_TABLET | Freq: Once | ORAL | Status: AC
  Administered 2019-11-19: 1000 mg via ORAL
  Filled 2019-11-19: qty 2

## 2019-11-19 MED ORDER — FENTANYL CITRATE (PF) 100 MCG/2ML IJ SOLN: 25.0000 ug | INTRAMUSCULAR | Status: DC | PRN

## 2019-11-19 MED ORDER — ROCURONIUM BROMIDE 10 MG/ML (PF) SYRINGE
PREFILLED_SYRINGE | INTRAVENOUS | Status: AC
  Filled 2019-11-19: qty 10

## 2019-11-19 MED ORDER — BUPIVACAINE HCL (PF) 0.25 % IJ SOLN
INTRAMUSCULAR | Status: DC | PRN
  Administered 2019-11-19: 20 mL via EPIDURAL

## 2019-11-19 MED ORDER — FENTANYL CITRATE (PF) 100 MCG/2ML IJ SOLN
INTRAMUSCULAR | Status: AC
  Filled 2019-11-19: qty 2

## 2019-11-19 MED ORDER — ONDANSETRON HCL 4 MG/2ML IJ SOLN
INTRAMUSCULAR | Status: DC | PRN
  Administered 2019-11-19: 4 mg via INTRAVENOUS

## 2019-11-19 MED ORDER — LACTATED RINGERS IV BOLUS
500.0000 mL | Freq: Once | INTRAVENOUS | Status: AC
  Administered 2019-11-19: 500 mL via INTRAVENOUS

## 2019-11-19 MED ORDER — ONDANSETRON HCL 4 MG/2ML IJ SOLN: 4.0000 mg | Freq: Once | INTRAMUSCULAR | Status: DC | PRN

## 2019-11-19 MED ORDER — SODIUM CHLORIDE 0.9 % IR SOLN
Status: DC | PRN
  Administered 2019-11-19: 1000 mL

## 2019-11-19 SURGICAL SUPPLY — 70 items
AID PSTN UNV HD RSTRNT DISP (MISCELLANEOUS) ×1
BAG SPEC THK2 15X12 ZIP CLS (MISCELLANEOUS) ×1
BAG ZIPLOCK 12X15 (MISCELLANEOUS) ×2 IMPLANT
BIT DRILL 1.6MX128 (BIT) ×2 IMPLANT
BLADE SAW SAG 73X25 THK (BLADE) ×1
BLADE SAW SGTL 73X25 THK (BLADE) ×1 IMPLANT
CEMENT BONE DEPUY (Cement) ×2 IMPLANT
CLSR STERI-STRIP ANTIMIC 1/2X4 (GAUZE/BANDAGES/DRESSINGS) ×2 IMPLANT
COMP CORTILOC GLENOID RT 15DEG (Miscellaneous) ×2 IMPLANT
COMPONENT CORT GLEND RT 15DEG (Miscellaneous) ×1 IMPLANT
COOLER ICEMAN CLASSIC (MISCELLANEOUS) IMPLANT
COVER BACK TABLE 60X90IN (DRAPES) ×2 IMPLANT
COVER SURGICAL LIGHT HANDLE (MISCELLANEOUS) ×2 IMPLANT
COVER WAND RF STERILE (DRAPES) IMPLANT
DRAPE INCISE IOBAN 66X45 STRL (DRAPES) ×2 IMPLANT
DRAPE ORTHO SPLIT 77X108 STRL (DRAPES) ×4
DRAPE POUCH INSTRU U-SHP 10X18 (DRAPES) ×2 IMPLANT
DRAPE SURG 17X11 SM STRL (DRAPES) ×2 IMPLANT
DRAPE SURG ORHT 6 SPLT 77X108 (DRAPES) ×2 IMPLANT
DRAPE TOP 10253 STERILE (DRAPES) ×2 IMPLANT
DRAPE U-SHAPE 47X51 STRL (DRAPES) ×2 IMPLANT
DRSG AQUACEL AG ADV 3.5X 4 (GAUZE/BANDAGES/DRESSINGS) ×2 IMPLANT
DRSG AQUACEL AG ADV 3.5X 6 (GAUZE/BANDAGES/DRESSINGS) ×2 IMPLANT
DURAPREP 26ML APPLICATOR (WOUND CARE) ×4 IMPLANT
ELECT BLADE TIP CTD 4 INCH (ELECTRODE) ×2 IMPLANT
ELECT REM PT RETURN 15FT ADLT (MISCELLANEOUS) ×2 IMPLANT
GLOVE BIO SURGEON STRL SZ7 (GLOVE) ×2 IMPLANT
GLOVE BIO SURGEON STRL SZ7.5 (GLOVE) ×2 IMPLANT
GLOVE BIOGEL PI IND STRL 7.0 (GLOVE) ×1 IMPLANT
GLOVE BIOGEL PI IND STRL 8 (GLOVE) ×1 IMPLANT
GLOVE BIOGEL PI INDICATOR 7.0 (GLOVE) ×1
GLOVE BIOGEL PI INDICATOR 8 (GLOVE) ×1
GOWN STRL REUS W/TWL LRG LVL3 (GOWN DISPOSABLE) ×2 IMPLANT
GOWN STRL REUS W/TWL XL LVL3 (GOWN DISPOSABLE) ×2 IMPLANT
GUIDEWIRE GLENOID 2.5X220 (WIRE) ×2 IMPLANT
HANDPIECE INTERPULSE COAX TIP (DISPOSABLE) ×2
HEAD HUM 4XHI OFST 23X52X (Head) ×1 IMPLANT
HEAD HUMERAL AEQUALIS 52X23 (Head) ×2 IMPLANT
HEMOSTAT SURGICEL 2X14 (HEMOSTASIS) ×2 IMPLANT
HOOD PEEL AWAY FLYTE STAYCOOL (MISCELLANEOUS) ×6 IMPLANT
KIT BASIN OR (CUSTOM PROCEDURE TRAY) ×2 IMPLANT
KIT TURNOVER KIT A (KITS) IMPLANT
MANIFOLD NEPTUNE II (INSTRUMENTS) ×2 IMPLANT
NEEDLE TROCAR POINT SZ 2 1/2 (NEEDLE) ×2 IMPLANT
NS IRRIG 1000ML POUR BTL (IV SOLUTION) ×2 IMPLANT
PACK SHOULDER (CUSTOM PROCEDURE TRAY) ×2 IMPLANT
PAD COLD SHLDR WRAP-ON (PAD) IMPLANT
PROTECTOR NERVE ULNAR (MISCELLANEOUS) IMPLANT
RESTRAINT HEAD UNIVERSAL NS (MISCELLANEOUS) ×2 IMPLANT
RETRIEVER SUT HEWSON (MISCELLANEOUS) ×2 IMPLANT
SET HNDPC FAN SPRY TIP SCT (DISPOSABLE) ×1 IMPLANT
SLING ARM IMMOBILIZER LRG (SOFTGOODS) ×2 IMPLANT
SMARTMIX MINI TOWER (MISCELLANEOUS) ×2
SPONGE LAP 18X18 RF (DISPOSABLE) ×2 IMPLANT
STEM HUMERAL AEQ 74X3A 127.5D (Stem) ×2 IMPLANT
STRIP CLOSURE SKIN 1/2X4 (GAUZE/BANDAGES/DRESSINGS) ×2 IMPLANT
SUCTION FRAZIER HANDLE 12FR (TUBING) ×2
SUCTION TUBE FRAZIER 12FR DISP (TUBING) ×1 IMPLANT
SUPPORT WRAP ARM LG (MISCELLANEOUS) ×2 IMPLANT
SUT ETHIBOND 2 OS 4 DA (SUTURE) ×2 IMPLANT
SUT ETHIBOND 2 V 37 (SUTURE) ×2 IMPLANT
SUT MNCRL AB 4-0 PS2 18 (SUTURE) ×2 IMPLANT
SUT VIC AB 2-0 CT1 27 (SUTURE) ×2
SUT VIC AB 2-0 CT1 TAPERPNT 27 (SUTURE) ×1 IMPLANT
TAPE LABRALWHITE 1.5X36 (TAPE) ×2 IMPLANT
TAPE SUT LABRALTAP WHT/BLK (SUTURE) ×2 IMPLANT
TOWEL OR 17X26 10 PK STRL BLUE (TOWEL DISPOSABLE) ×2 IMPLANT
TOWER SMARTMIX MINI (MISCELLANEOUS) ×1 IMPLANT
WATER STERILE IRR 1000ML POUR (IV SOLUTION) ×2 IMPLANT
YANKAUER SUCT BULB TIP 10FT TU (MISCELLANEOUS) ×2 IMPLANT

## 2019-11-19 NOTE — Anesthesia Postprocedure Evaluation (Signed)
Anesthesia Post Note  Patient: [name]  Procedure(s) Performed: TOTAL SHOULDER ARTHROPLASTY (Right Shoulder)     Patient location during evaluation: PACU Anesthesia Type: Regional and General Level of consciousness: sedated Pain management: pain level controlled Vital Signs Assessment: post-procedure vital signs reviewed and stable Respiratory status: spontaneous breathing and respiratory function stable Cardiovascular status: stable Postop Assessment: no apparent nausea or vomiting Anesthetic complications: no   No complications documented.  Last Vitals:  Vitals:   11/19/19 1530 11/19/19 1615  BP: (!) 142/69 (!) 141/79  Pulse: 60 69  Resp: (!) 22   Temp:  36.6 C  SpO2: 97% 96%    Last Pain:  Vitals:   11/19/19 1615  TempSrc:   PainSc: 0-No pain                 Mellody Dance

## 2019-11-19 NOTE — Anesthesia Procedure Notes (Addendum)
Anesthesia Regional Block: Interscalene brachial plexus block   Pre-Anesthetic Checklist: ,, timeout performed, Correct Patient, Correct Site, Correct Laterality, Correct Procedure, Correct Position, site marked, Risks and benefits discussed,  Surgical consent,  Pre-op evaluation,  At surgeon's request and post-op pain management  Laterality: Right  Prep: chloraprep       Needles:  Injection technique: Single-shot  Needle Type: Echogenic Stimulator Needle     Needle Length: 10cm  Needle Gauge: 21     Additional Needles:   Procedures:,,,, ultrasound used (permanent image in chart),,,,  Narrative:  Start time: 11/19/2019 11:10 AM End time: 11/19/2019 11:15 AM Injection made incrementally with aspirations every 5 mL.  Performed by: Personally  Anesthesiologist: Mellody Dance, MD  Additional Notes: Functioning IV was confirmed and monitors applied.  Sterile prep and drape,hand hygiene and sterile gloves were used.Ultrasound guidance: relevant anatomy identified, needle position confirmed, local anesthetic spread visualized around nerve(s)., vascular puncture avoided.  Image printed for medical record.  Negative aspiration and negative test dose prior to incremental administration of local anesthetic. The patient tolerated the procedure well.

## 2019-11-19 NOTE — Anesthesia Procedure Notes (Signed)
Procedure Name: Intubation Date/Time: 11/19/2019 12:10 PM Performed by: Wynonia Sours, CRNA Pre-anesthesia Checklist: Patient identified, Emergency Drugs available, Suction available, Patient being monitored and Timeout performed Patient Re-evaluated:Patient Re-evaluated prior to induction Oxygen Delivery Method: Circle system utilized Preoxygenation: Pre-oxygenation with 100% oxygen Induction Type: IV induction Ventilation: Two handed mask ventilation required Laryngoscope Size: 3, Miller and 2 Grade View: Grade II Tube type: Oral Tube size: 7.5 mm Number of attempts: 2 Airway Equipment and Method: Stylet and Video-laryngoscopy Placement Confirmation: ETT inserted through vocal cords under direct vision,  positive ETCO2 and breath sounds checked- equal and bilateral Secured at: 23 cm Tube secured with: Tape Dental Injury: Teeth and Oropharynx as per pre-operative assessment  Comments: Intubated by Boykin Reaper, SRNA by DL. ETT cuff at vocal cords, deflated cuff. Utilized glidescope to visualize passage of entire cuff past VC. Secured at [employer]

## 2019-11-19 NOTE — H&P (Signed)
[name] is an 59 y.o. male.   Chief Complaint: Right shoulder pain and dysfunction HPI: Right shoulder advanced osteoarthritis, failed conservative management.  Indicated for surgery to decrease pain and restore function.  Past Medical History:  Diagnosis Date  . ADD (attention deficit disorder)   . Anginal pain (HCC) 2012  . Arthritis, shoulder region   . B12 deficiency   . Back pain   . Coronary artery disease   . Diabetes (HCC)   . Diabetic retinopathy (HCC)   . GERD (gastroesophageal reflux disease)   . H/O heart artery stent 04/2010  . Hyperlipidemia   . Hypertension   . Joint pain   . Knee pain   . Neuropathy   . NSTEMI (non-ST elevated myocardial infarction) (HCC)   . Obesity   . Osteoarthritis   . Persistent cough    stopped for a year  . Sleep apnea   . Swallowing difficulty   . Swelling of both lower extremities     Past Surgical History:  Procedure Laterality Date  . ACHILLES TENDON REPAIR Right   . AMPUTATION TOE Right 03/14/2019   Procedure: AMPUTATION TOE, 5th toe;  Surgeon: [name], DPM;  Location: [facility] OR;  Service: Podiatry;  Laterality: Right;  . heart stent  2012   x1  . INCISION AND DRAINAGE Right 03/14/2019   Procedure: INCISION AND DRAINAGE;  Surgeon: [name], DPM;  Location: [facility] OR;  Service: Podiatry;  Laterality: Right;  . SHOULDER ARTHROSCOPY Right   . TEE WITHOUT CARDIOVERSION N/A 03/17/2019   Procedure: TRANSESOPHAGEAL ECHOCARDIOGRAM (TEE);  Surgeon: Chilton Si, MD;  Location: [facility] ENDOSCOPY;  Service: Cardiovascular;  Laterality: N/A;  . TOE SURGERY Right 06/2018   for a hammer toe  . UVULOPALATOPHARYNGOPLASTY      Family History  Problem Relation Age of Onset  . Cancer Other   . Diabetes Other   . Heart disease Other   . Breast cancer Mother   . Cancer Mother   . Heart disease Father   . Diabetes Father   . Stroke Father   . Leukemia Brother   . Asthma Maternal Uncle    Social History:  reports that he is  a non-smoker but has been exposed to tobacco smoke. He has never used smokeless tobacco. He reports current alcohol use. He reports that he does not use drugs.  Allergies: No Known Allergies  Medications Prior to Admission  Medication Sig Dispense Refill  . amLODipine (NORVASC) 5 MG tablet Take 5 mg by mouth daily.    Marland Kitchen aspirin EC 81 MG tablet Take 81 mg by mouth daily.    Marland Kitchen atorvastatin (LIPITOR) 10 MG tablet Take 1 tablet (10 mg total) by mouth daily. 90 tablet 3  . cetirizine (ZYRTEC) 10 MG tablet Take 10 mg by mouth daily.    . Cholecalciferol (VITAMIN D) 50 MCG (2000 UT) tablet Take 4,000 Units by mouth daily.     . cloNIDine (CATAPRES) 0.2 MG tablet Take 0.2 mg by mouth every evening.     Marland Kitchen glimepiride (AMARYL) 4 MG tablet Take 4 mg by mouth daily with breakfast.     . Magnesium 400 MG CAPS Take 400 mg by mouth daily.     . metFORMIN (GLUCOPHAGE-XR) 500 MG 24 hr tablet Take 1,000 mg by mouth 2 (two) times daily.     . pantoprazole (PROTONIX) 40 MG tablet TAKE 1 TABLET BY MOUTH TWICE DAILY (Patient taking differently: Take 40 mg by mouth 2 (two) times  daily. ) 60 tablet 3  . terbinafine (LAMISIL) 250 MG tablet Take 1 tablet (250 mg total) by mouth daily. 90 tablet 0  . vitamin B-12 (CYANOCOBALAMIN) 500 MCG tablet Take 500 mcg by mouth daily.     . Vitamin D, Ergocalciferol, (DRISDOL) 1.25 MG (50000 UNIT) CAPS capsule Take 1 capsule (50,000 Units total) by mouth every 7 (seven) days. 4 capsule 0  . albuterol (VENTOLIN HFA) 108 (90 Base) MCG/ACT inhaler Inhale 2 puffs into the lungs every 6 (six) hours as needed for wheezing or shortness of breath.    . BD INSULIN SYRINGE U/F 31G X 5/16" 1 ML MISC See admin instructions.    Marland Kitchen loratadine (CLARITIN) 10 MG tablet Take 10 mg by mouth daily.     Marland Kitchen Spacer/Aero-Holding Chambers (AEROCHAMBER MV) inhaler Use as instructed (Patient taking differently: as needed. Use as instructed for cough) 1 each 0  . terbinafine (LAMISIL) 250 MG tablet Take 1  tablet (250 mg total) by mouth daily. 90 tablet 0    Results for orders placed or performed during the hospital encounter of 11/19/19 (from the past 48 hour(s))  Glucose, capillary     Status: Abnormal   Collection Time: 11/19/19 10:27 AM  Result Value Ref Range   Glucose-Capillary 110 (H) 70 - 99 mg/dL    Comment: Glucose reference range applies only to samples taken after fasting for at least 8 hours.   Comment 1 Notify RN    Comment 2 Document in Chart   ABO/Rh     Status: None   Collection Time: 11/19/19 10:40 AM  Result Value Ref Range   ABO/RH(D)      O POS Performed at [facility], [address], [place], [place] [address]    DG Chest 2 View  Result Date: 11/17/2019 CLINICAL DATA:  Preop evaluation for upcoming shoulder surgery, initial encounter EXAM: CHEST - 2 VIEW COMPARISON:  09/25/2019 FINDINGS: The heart size and mediastinal contours are within normal limits. Both lungs are clear. The visualized skeletal structures are unremarkable. IMPRESSION: No active cardiopulmonary disease. Electronically Signed   By: Alcide Clever M.D.   On: 11/17/2019 23:39    Review of Systems  All other systems reviewed and are negative.   Blood pressure (!) 155/79, pulse 64, temperature 97.9 F (36.6 C), temperature source Oral, resp. rate 14, height 5\' 11"  (1.803 m), weight 122 kg, SpO2 100 %. Physical Exam Constitutional:      Appearance: He is well-developed.  HENT:     Head: Atraumatic.  Pulmonary:     Effort: Pulmonary effort is normal.  Musculoskeletal:     Comments: Right shoulder pain with limited range of motion.  Skin:    General: Skin is warm and dry.  Neurological:     Mental Status: He is alert and oriented to person, place, and time.      Assessment/Plan Right shoulder advanced osteoarthritis, failed conservative management.  Indicated for surgery to decrease pain and restore function. Plan right total shoulder replacement with augmented glenoid  component Risks / benefits of surgery discussed Consent on chart  NPO for OR Preop antibiotics   , MD 11/19/2019, 11:30 AM

## 2019-11-19 NOTE — Progress Notes (Signed)
AssistedDr. Bass with right, ultrasound guided, interscalene  block. Side rails up, monitors on throughout procedure. See vital signs in flow sheet. Tolerated Procedure well.  

## 2019-11-19 NOTE — Progress Notes (Addendum)
Chief Complaint:   OBESITY [name] is here to discuss his progress with his obesity treatment plan along with follow-up of his obesity related diagnoses. [name] is on the Category 1 Plan and states he is following his eating plan approximately 75% of the time. [name] states he is exercising for 0 minutes 0 times per week.  Today's visit was #: 6 Starting weight: 310 lbs Starting date: 09/01/2019 Today's weight: 269 lbs Today's date: 11/18/2019 Total lbs lost to date: 41 lbs Total lbs lost since last in-office visit: 6 lbs  Interim History: [name] says that he has done more eating out with family over the past 2 weeks.  He had [place], shrimp and grits, etc.  He says he made [name]/PC.  "On plan 100% otherwise.  I love the plan."  He will be having shoulder replacement surgery tomorrow with Dr. Ave Filter.  Recently labs were done at the [facility] in preparation for the procedure, which we reviewed today.  Assessment/Plan:    1. Type 2 diabetes mellitus with other specified complication, with long-term current use of insulin (HCC)   Diabetes Mellitus: stable. Medication: Amaryl 4 mg daily, metformin 1000 mg twice daily.   FBS 67-141.  Symptoms were not as bad when he went into the 60s this time as prior.  A1c was recently obtained.  Reviewed with him.    Plan: Discussed labs with patient today (from [facility] presurgery labs).   Encouraged pt to f/up with PCP re: routine screenings- yrly foot, eye etc and as often as they see fit.   -> TODAY- We will discontinue insulin.    Next step is to cut Amaryl in half, likely at next office visit.  Continue close home blood sugar monitoring.   Hypoglycemia prevention discussed with him today as well  Lab Results  Component Value Date   HGBA1C 5.4 11/17/2019   HGBA1C 6.6 (H) 09/01/2019   HGBA1C 8.8 (H) 03/13/2019   Lab Results  Component Value Date   LDLCALC 89 09/01/2019   CREATININE 1.51 (H) 11/17/2019    2. Vitamin D  Deficiency  [name] has a history of Vitamin D deficiency with resultant generalized fatigue as his primary symptom.  he is taking ergocalciferol for this deficiency and tolerating it well without side-effect.   Most recent Vitamin D lab reviewed-  level: 31.7   Plan:   - Discussed importance of vitamin D (as well as calcium) to their health and well-being.   - We reviewed possible symptoms of low Vitamin D including low energy, depressed mood, muscle aches, joint aches, osteoporosis etc.  - We discussed that low Vitamin D levels may be linked to an increased risk of cardiovascular events and even increased risk of cancers- such as colon and breast.   - Educated pt that weight loss will likely improve availability of vitamin D, thus encouraged [name] to continue with meal plan and their weight loss efforts to further improve this condition  - I recommend pt take a weekly prescription vit D- see script below- which pt agrees to after discussion of risks and benefits of this medication.      - Informed patient this may be a lifelong thing, and he was encouraged to continue to take the medicine until pt told otherwise.   We will need to monitor levels regularly ( q 3-4 mo on average )  to keep levels within normal limits.   - All pt's questions and concerns regarding this condition addressed  Meds ordered this encounter  Medications  . Vitamin D, Ergocalciferol, (DRISDOL) 1.25 MG (50000 UNIT) CAPS capsule    Sig: Take 1 capsule (50,000 Units total) by mouth every 7 (seven) days.    Dispense:  4 capsule    Refill:  0     2. At risk for hypoglycemia [name] was given approximately 10 minutes of counseling today regarding prevention of hypoglycemia.  He was advised of symptoms of hypoglycemia.  [name] was instructed to avoid skipping meals, and to eat regular protein-rich meals as prescribed on the meal plan.  Have readily available- low calorie snacks as needed ie- [employer] fruit  snack packs if BS goes too low.     3. Class 2 severe obesity with serious comorbidity and body mass index (BMI) of 37.0 to 37.9 in adult, unspecified obesity type [name])  [name] is currently in the action stage of change. As such, his goal is to continue with weight loss efforts  --->  He has agreed to the Category 1 Plan + 100-200 calories and 10-20+ grams of protein.  We discussed it is best to shoot for a 10:1 ratio for these additional calories.   Exercise goals: Unable to do currently due to significant shoulder pain.   -> Having shoulder joint replacement 11/20/19.   Behavioral modification strategies: increasing lean protein intake, no skipping meals, meal planning and cooking strategies and planning for success.  [name] has agreed to follow-up with our clinic in 2 weeks. He was informed of the importance of frequent follow-up visits to maximize his success with intensive lifestyle modifications for his multiple health conditions.     Objective:   Blood pressure 135/73, pulse 65, temperature 97.9 F (36.6 C), height 5\' 11"  (1.803 m), weight 269 lb (122 kg), SpO2 97 %. Body mass index is 37.52 kg/m.  General: Cooperative, alert, well developed, in no acute distress. HEENT: Conjunctivae and lids unremarkable. Cardiovascular: Regular rhythm.  Lungs: Normal work of breathing. Neurologic: No focal deficits.   Lab Results  Component Value Date   CREATININE 1.51 (H) 11/17/2019   BUN 22 (H) 11/17/2019   NA 137 11/17/2019   K 4.4 11/17/2019   CL 101 11/17/2019   CO2 24 11/17/2019   Lab Results  Component Value Date   ALT 22 11/17/2019   AST 21 11/17/2019   ALKPHOS 64 11/17/2019   BILITOT 0.9 11/17/2019   Lab Results  Component Value Date   HGBA1C 5.4 11/17/2019   HGBA1C 6.6 (H) 09/01/2019   HGBA1C 8.8 (H) 03/13/2019   HGBA1C (H) 04/26/2010    11.9 (NOTE)                                                                       According to the ADA Clinical Practice  Recommendations for 2011, when HbA1c is used as a screening test:   >=6.5%   Diagnostic of Diabetes Mellitus           (if abnormal result  is confirmed)  5.7-6.4%   Increased risk of developing Diabetes Mellitus  References:Diagnosis and Classification of Diabetes Mellitus,Diabetes Care,2011,34(Suppl 1):S62-S69 and Standards of Medical Care in         Diabetes - 2011,Diabetes Care,2011,34  (Suppl 1):S11-S61.   Lab  Results  Component Value Date   INSULIN 28.9 (H) 09/01/2019   Lab Results  Component Value Date   TSH 3.220 09/01/2019   Lab Results  Component Value Date   CHOL 155 09/01/2019   HDL 46 09/01/2019   LDLCALC 89 09/01/2019   TRIG 107 09/01/2019   CHOLHDL 3.4 09/01/2019   Lab Results  Component Value Date   WBC 7.3 11/17/2019   HGB 14.2 11/17/2019   HCT 42.3 11/17/2019   MCV 89.4 11/17/2019   PLT 284 11/17/2019   Attestation Statements:   Reviewed by clinician on day of visit: allergies, medications, problem list, medical history, surgical history, family history, social history, and previous encounter notes.  I, Insurance claims handler, CMA, am acting as Energy manager for [employer], DO.  I have reviewed the above documentation for accuracy and completeness, and I agree with the above. Thomasene Lot, DO

## 2019-11-19 NOTE — Op Note (Signed)
Procedure(s): TOTAL SHOULDER ARTHROPLASTY Procedure Note  [name] male 59 y.o. 11/19/2019   Preoperative diagnosis: Right shoulder end-stage osteoarthritis with B2 glenoid anatomy  Postoperative diagnosis: Same  Procedure(s) and Anesthesia Type: Right TOTAL SHOULDER ARTHROPLASTY with augmented glenoid component- General  Surgeon(s) and Role:    [name], MD - Primary   Indications:  59 y.o. male  With endstage right shoulder arthritis. Pain and dysfunction interfered with quality of life and nonoperative treatment with activity modification, NSAIDS and injections failed.     Surgeon: [name]   Assistants: Damita Lack PA-C [facility] was present and scrubbed throughout the procedure and was essential in positioning, retraction, exposure, and closure)  Anesthesia: General endotracheal anesthesia with preoperative interscalene block given by the attending anesthesiologist     Procedure Detail  TOTAL SHOULDER ARTHROPLASTY  Findings: Tornier flex anatomic press-fit size 3 stem with a 52 x 23 head, cemented size large +15 augmented performed plus Cortiloc glenoid.   A lesser tuberosity osteotomy was perform and repaired at the conclusion of the procedure.  Estimated Blood Loss:  200 mL         Drains: None   Blood Given: none          Specimens: none        Complications:  * No complications entered in OR log *         Disposition: PACU - hemodynamically stable.         Condition: stable    Procedure:   The patient was identified in the preoperative holding area where I personally marked the operative extremity after verifying with the patient and consent. He  was taken to the operating room where He was transferred to the   operative table.  The patient received an interscalene block in   the holding area by the attending anesthesiologist.  General anesthesia was induced   in the operating room without complication.  The patient  did receive IV  Ancef prior to the commencement of the procedure.  The patient was   placed in the beach-chair position with the back raised about 30   degrees.  The nonoperative extremity and head and neck were carefully   positioned and padded protecting against neurovascular compromise.  The   left upper extremity was then prepped and draped in the standard sterile   fashion.    The appropriate operative time-out was performed with   Anesthesia, the perioperative staff, as well as myself and we all agreed   that the right side was the correct operative site.  An approximately   10 cm incision was made from the tip of the coracoid to the center point of the   humerus at the level of the axilla.  Dissection was carried down sharply   through subcutaneous tissues and cephalic vein was identified and taken   laterally with the deltoid.  The pectoralis major was taken medially.  The   upper 1 cm of the pectoralis major was released from its attachment on   the humerus.  The clavipectoral fascia was incised just lateral to the   conjoined tendon.  This incision was carried up to but not into the   coracoacromial ligament.  Digital palpation was used to prove   integrity of the axillary nerve which was protected throughout the   procedure.  Musculocutaneous nerve was not palpated in the operative   field.  Conjoined tendon was then retracted gently medially and the  deltoid laterally.  Anterior circumflex humeral vessels were clamped and   coagulated.  The soft tissues overlying the biceps was incised and this   incision was carried across the transverse humeral ligament to the base   of the coracoid.  The biceps was noted to be severely degenerated. It was released from the superior labrum. The biceps was then tenodesed to the soft tissue just above   pectoralis major and the remaining portion of the biceps superiorly was   excised.  An osteotomy was performed at the lesser tuberosity.  The  capsule was then   released all the way down to the 6 o'clock position of the humeral head.   The humeral head was then delivered with simultaneous adduction,   extension and external rotation.  All humeral osteophytes were removed   and the anatomic neck of the humerus was marked and cut free hand at   approximately 25 degrees retroversion within about 3 mm of the cuff   reflection posteriorly.  The head size was estimated to be a 52 medium   offset.  At that point, the humeral head was retracted posteriorly with   a Fukuda retractor.   Remaining portion of the capsule was released at the base of the   coracoid.  The remaining biceps anchor and the entire anterior-inferior   labrum was excised.  The posterior labrum was also excised but the   posterior capsule was not released.  The glenoid was noted to be B2 morphology.  the guidepin was placed bicortically with perform plus anterior glenoid referencing guide.  The reamer was used to ream to the halfway point anteriorly.  The anterior reference hole was then drilled.  The posterior angled reamer was then used at 15 degrees until the posterior half of the glenoid was appropriately reamed.  The checker was used to ensure appropriate concentric fit.  The peripheral holes were then drilled followed by the the center hole and none of the holes   exited the glenoid wall.  The trial was placed and felt to be an appropriate fit.  I then pulse irrigated these holes and dried   them with Surgicel.  The three peripheral holes were then   pressurized cemented and the anchor peg glenoid was placed and impacted   with an excellent fit.  The glenoid was a large +15 perform plus component.  The proximal humerus was then again exposed taking care not to displace the glenoid.    The entry awl was used followed by sounding reamers and then sequentially broached from size 1 to 3. This was then left in place and the calcar planer was used. Trial head was placed with a  52 x 23.  With the trial implantation of the component,  there was approximately 50% posterior translation with immediate snap back to the   anatomic position.  With forward elevation, there was no tendency   towards posterior subluxation.   The trial was removed and the final implant was prepared on a back table.  The trial was removed and the final implant was prepared on a back table.   3 small holes were drilled on the medial side of the lesser tuberosity osteotomy, through which 2 labral tapes were passed. The implant was then placed through the loop of the 2 labral tapes and impacted with an excellent press-fit. This achieved excellent anatomic reconstruction of the proximal humerus.  The joint was then copiously irrigated with pulse lavage.  The subscapularis and  lesser tuberosity osteotomy were then repaired using the 2 labral tapes previously passed in a double row fashion with horizontal mattress sutures medially brought over through bone tunnels tied over a bone bridge laterally.   One #1 Ethibond was placed at the rotator interval just above   the lesser tuberosity. Copious irrigation was used. Skin was closed with 2-0 Vicryl sutures in the deep dermal layer and 4-0 Monocryl in a subcuticular  running fashion.  Sterile dressings were then applied including Aquacel.  The patient was placed in a sling and allowed to awaken from general anesthesia and taken to the recovery room in stable condition.      POSTOPERATIVE PLAN:  Early passive range of motion will be allowed with the goal of 0 degrees external rotation and 90 degrees forward elevation.  No internal rotation at this time.  No active motion of the arm until the lesser tuberosity heals.  He will be observed in the recovery room today and if he is doing well we will plan on discharging home with family today.

## 2019-11-19 NOTE — Progress Notes (Signed)
Orthopedic Tech Progress Note Patient Details:  [name] 1960-04-28 [id number]  Ortho Devices Type of Ortho Device: Shoulder immobilizer Ortho Device/Splint Location: RUE Ortho Device/Splint Interventions: Ordered, Application   Post Interventions Patient Tolerated: Well Instructions Provided: Care of device   Jennye Moccasin 11/19/2019, 4:40 PM

## 2019-11-19 NOTE — Discharge Instructions (Signed)
Discharge Instructions after Total Shoulder Arthroplasty   . A sling has been provided for you. Remove the sling 5 times each day to perform motion exercises. . Use ice on the shoulder intermittently over the first 48 hours after surgery.  . Pain medication has been prescribed for you.  . Use your medication liberally over the first 48 hours, and then begin to taper your use. You may take Extra Strength Tylenol or Tylenol only in place of the pain pills. DO NOT take ANY nonsteroidal anti-inflammatory pain medications: Advil, Motrin, Ibuprofen, Aleve, Naproxen, or Naprosyn. . Take one aspirin a day for 2 weeks after surgery, unless you have an aspirin sensitivity/allergy or asthma. . Leave your dressing on until your first follow up visit.  You may shower with the dressing.  Hold your arm as if you still have your sling on while you shower. . Active reaching and lifting are not permitted. You may use the operative arm for activities of daily living that do not require the operative arm to leave the side of the body, such as eating, drinking, bathing, etc.  . Three to 5 times each day you should perform assisted overhead reaching and external rotation (outward turning) exercises with the operative arm. You were taught these exercises prior to discharge. Both exercises should be done with the non-operative arm used as the "therapist arm" while the operative arm remains relaxed. Ten of each exercise should be done three to five times each day.   Overhead reach is helping to lift your stiff arm up as high as it will go. To stretch your overhead reach, lie flat on your back, relax, and grasp the wrist of the tight shoulder with your opposite hand. Using the power in your opposite arm, bring the stiff arm up as far as it is comfortable. Start holding it for ten seconds and then work up to where you can hold it for a count of 30. Breathe slowly and deeply while the arm is moved. Repeat this stretch ten times,  trying to help the ar up a little higher each time.     External rotation is turning the arm out to the side while your elbow stays close to your body. External rotation is best stretched while you are lying on your back. Hold a cane, yardstick, broom handle, or dowel in both hands. Bend both elbows to a right angle. Use steady, gentle force from your normal arm to rotate the hand of the stiff shoulder out away from your body. Continue the rotation until it is straight in front of you holding it there for a count of 10. Do not go beyond this level of rotation until seen back by Dr. Chandler. Repeat this exercise ten times slowly.      Please call [phone] during normal business hours or [phone] after hours for any problems. Including the following:  - excessive redness of the incisions - drainage for more than 4 days - fever of more than 101.5 F  *Please note that pain medications will not be refilled after hours or on weekends.  Dental Antibiotics:  In most cases prophylactic antibiotics for Dental procdeures after total joint surgery are not necessary.  Exceptions are as follows:  1. History of prior total joint infection  2. Severely immunocompromised (Organ Transplant, cancer chemotherapy, Rheumatoid biologic meds such as Humera)  3. Poorly controlled diabetes (A1C &gt; 8.0, blood glucose over 200)  If you have one of these conditions, contact your surgeon for   an antibiotic prescription, prior to your dental procedure.    

## 2019-11-19 NOTE — Evaluation (Signed)
Occupational Therapy Evaluation Patient Details Name: [name] MRN: [id number] DOB: Apr 15, 1960 Today's Date: 11/19/2019    History of Present Illness Patient is a 59 year old s/p T TSA.   Clinical Impression   Mr. Adekunle Rohrbach is a 59 year old man s/p shoulder replacement without functional use of right dominant upper extremity secondary to effects of surgery, interscalene block and shoulder precautions. Therapist provided education and instruction to patient and significant other in regards to exercises, precautions, positioning, donning upper extremity clothing and bathing while maintaining shoulder precautions, ice and edema management and donning/doffing sling. Patient and significant other verbalized understanding and demonstrated as needed. Patient needed assistance to donn shirt, underwear, pants, socks and shoes and provided with instruction on compensatory strategies to perform ADLs. Patient to follow up with MD for further therapy needs.      Follow Up Recommendations  Follow surgeon's recommendation for DC plan and follow-up therapies    Equipment Recommendations  None recommended by OT    Recommendations for Other Services       Precautions / Restrictions Precautions Precaution Comments: Passive ROM limited to 90 FF, 0 ER, sling edu, ADLs      Mobility Bed Mobility                  Transfers Overall transfer level: Modified independent                    Balance Overall balance assessment: No apparent balance deficits (not formally assessed)                                         ADL either performed or assessed with clinical judgement   ADL Overall ADL's : Needs assistance/impaired Eating/Feeding: Set up   Grooming: Modified independent   Upper Body Bathing: Minimal assistance   Lower Body Bathing: Modified independent   Upper Body Dressing : Minimal assistance;Adhering to UE precautions   Lower Body Dressing:  Minimal assistance;Sit to/from stand   Toilet Transfer: Supervision/safety   Toileting- Architect and Hygiene: Supervision/safety               Vision Baseline Vision/History: Wears glasses Wears Glasses: At all times       Perception     Praxis      Pertinent Vitals/Pain Pain Assessment: No/denies pain     Hand Dominance     Extremity/Trunk Assessment Upper Extremity Assessment Upper Extremity Assessment: RUE deficits/detail RUE Deficits / Details: Impaired active movement secondary to interscalene block.           Communication     Cognition Arousal/Alertness: Awake/alert Behavior During Therapy: WFL for tasks assessed/performed Overall Cognitive Status: Within Functional Limits for tasks assessed                                     General Comments       Exercises     Shoulder Instructions Shoulder Instructions Donning/doffing shirt without moving shoulder: Patient able to independently direct caregiver;Caregiver independent with task Method for sponge bathing under operated UE: Patient able to independently direct caregiver;Caregiver independent with task Donning/doffing sling/immobilizer: Patient able to independently direct caregiver Correct positioning of sling/immobilizer: Independent Sling wearing schedule (on at all times/off for ADL's): Independent Proper positioning of operated UE when showering: Independent Dressing  change: Independent Positioning of UE while sleeping: Independent    Home Living Family/patient expects to be discharged to:: Private residence Living Arrangements: Spouse/significant other Available Help at Discharge: Family;Available 24 hours/day Type of Home: House                                  Prior Functioning/Environment                   OT Problem List:        OT Treatment/Interventions:      OT Goals(Current goals can be found in the care plan section) Acute  Rehab OT Goals OT Goal Formulation: All assessment and education complete, DC therapy  OT Frequency:     Barriers to D/C:            Co-evaluation              AM-PAC OT "6 Clicks" Daily Activity     Outcome Measure Help from another person eating meals?: A Little Help from another person taking care of personal grooming?: None Help from another person toileting, which includes using toliet, bedpan, or urinal?: None Help from another person bathing (including washing, rinsing, drying)?: A Little Help from another person to put on and taking off regular upper body clothing?: A Little Help from another person to put on and taking off regular lower body clothing?: A Little 6 Click Score: 20   End of Session Nurse Communication:  (OT education complete)  Activity Tolerance: Patient tolerated treatment well Patient left: in chair  OT Visit Diagnosis: Muscle weakness (generalized) (M62.81)                Time: 1194-1740 OT Time Calculation (min): 26 min Charges:  OT General Charges $OT Visit: 1 Visit OT Evaluation $OT Eval Low Complexity: 1 Low OT Treatments $Self Care/Home Management : 8-22 mins  Matalynn Graff, OTR/L Acute Care Rehab Services  Office [phone] Pager: [phone]   Kelli Churn 11/19/2019, 4:58 PM

## 2019-11-19 NOTE — Transfer of Care (Signed)
Immediate Anesthesia Transfer of Care Note  Patient: [name]  Procedure(s) Performed: TOTAL SHOULDER ARTHROPLASTY (Right Shoulder)  Patient Location: PACU  Anesthesia Type:GA combined with regional for post-op pain  Level of Consciousness: awake, drowsy and patient cooperative  Airway & Oxygen Therapy: Patient Spontanous Breathing and Patient connected to face mask oxygen  Post-op Assessment: Report given to RN and Post -op Vital signs reviewed and stable  Post vital signs: Reviewed and stable  Last Vitals:  Vitals Value Taken Time  BP 151/81 11/19/19 1418  Temp    Pulse 62 11/19/19 1420  Resp 22 11/19/19 1420  SpO2 98 % 11/19/19 1420  Vitals shown include unvalidated device data.  Last Pain:  Vitals:   11/19/19 1058  TempSrc:   PainSc: 0-No pain         Complications: No complications documented.

## 2019-11-20 ENCOUNTER — Other Ambulatory Visit: Payer: Self-pay | Admitting: Sports Medicine

## 2019-11-20 MED ORDER — AMOXICILLIN-POT CLAVULANATE 875-125 MG PO TABS: 1.0000 | ORAL_TABLET | Freq: Two times a day (BID) | ORAL | 0 refills | Status: DC

## 2019-11-20 NOTE — Progress Notes (Signed)
Sent antibiotic for ulceration to foot -Dr. Kathie Rhodes

## 2019-11-23 MED ORDER — VITAMIN D (ERGOCALCIFEROL) 1.25 MG (50000 UNIT) PO CAPS: 50000.0000 [IU] | ORAL_CAPSULE | ORAL | 0 refills | Status: DC

## 2019-11-23 NOTE — Addendum Note (Signed)
Addended by: Len Blalock on: 11/23/2019 03:16 PM   Modules accepted: Orders

## 2019-11-23 NOTE — Addendum Note (Signed)
Addended by: Len Blalock on: 11/23/2019 02:52 PM   Modules accepted: Level of Service

## 2019-11-24 ENCOUNTER — Encounter (HOSPITAL_COMMUNITY): Payer: Self-pay | Admitting: Orthopedic Surgery

## 2019-11-24 MED ORDER — VITAMIN D (ERGOCALCIFEROL) 1.25 MG (50000 UNIT) PO CAPS: 50000.0000 [IU] | ORAL_CAPSULE | ORAL | 0 refills | Status: DC

## 2019-11-24 NOTE — Addendum Note (Signed)
Addended by: Carlye Grippe on: 11/24/2019 02:50 PM   Modules accepted: Orders

## 2019-11-26 ENCOUNTER — Encounter: Payer: Self-pay | Admitting: Sports Medicine

## 2019-11-26 ENCOUNTER — Ambulatory Visit (INDEPENDENT_AMBULATORY_CARE_PROVIDER_SITE_OTHER): Payer: BC Managed Care – PPO | Admitting: Sports Medicine

## 2019-11-26 ENCOUNTER — Other Ambulatory Visit: Payer: Self-pay

## 2019-11-26 DIAGNOSIS — E1140 Type 2 diabetes mellitus with diabetic neuropathy, unspecified: Secondary | ICD-10-CM

## 2019-11-26 DIAGNOSIS — Z89421 Acquired absence of other right toe(s): Secondary | ICD-10-CM | POA: Diagnosis not present

## 2019-11-26 DIAGNOSIS — S90822D Blister (nonthermal), left foot, subsequent encounter: Secondary | ICD-10-CM

## 2019-11-26 DIAGNOSIS — M14679 Charcot's joint, unspecified ankle and foot: Secondary | ICD-10-CM

## 2019-11-26 DIAGNOSIS — M25372 Other instability, left ankle: Secondary | ICD-10-CM | POA: Diagnosis not present

## 2019-11-26 NOTE — Progress Notes (Signed)
Subjective: 59 year old diabetic male patient seen in office for wound check on the left foot.  Patient reports that he has been wearing his sandals and has not experienced any rubbing on the left denies any redness warmth drainage swelling or any other acute signs or symptoms at this time.  Patient is status post right shoulder surgery.  No other issues noted.  FBS no recorded  Patient Active Problem List   Diagnosis Date Noted  . Coronary artery disease involving native coronary artery of native heart without angina pectoris 11/03/2019  . Severe nonproliferative diabetic retinopathy of right eye, with macular edema, associated with type 2 diabetes mellitus (HCC) 07/27/2019  . Severe nonproliferative diabetic retinopathy of left eye, with macular edema, associated with type 2 diabetes mellitus (HCC) 07/27/2019  . Hypertensive retinopathy of both eyes 07/27/2019  . Cellulitis of right foot   . S/P foot surgery, right   . MSSA bacteremia   . Osteomyelitis of fifth toe of right foot (HCC) 03/13/2019  . DKA (diabetic ketoacidosis) (HCC) 03/13/2019  . Acute osteomyelitis of toe of right foot (HCC)   . Demand ischemia (HCC)   . Hyperglycemia   . Laryngopharyngeal reflux (LPR) 02/07/2016  . GERD (gastroesophageal reflux disease) 12/27/2015  . Arthritis 11/16/2015  . Diabetes mellitus (HCC) 11/16/2015  . Hyperlipidemia 11/16/2015  . Localized edema 11/16/2015  . Morbid obesity (HCC) 05/15/2007  . ADD 05/15/2007  . Obesity, diabetes, and hypertension syndrome (HCC) 05/15/2007  . Allergic rhinitis 05/15/2007  . Obstructive sleep apnea 05/15/2007  . COUGH 05/15/2007    Objective General: No acute distress  Right Lower extremity: Amp wound site healed on right,S/p 5th partial ray amputation.  Left lower extremity, preulcerative lesion/blood blister that is dry and stable with no signs of infection.   Nails x9 are well manicured patient currently going for laser with great improvement.    Problem List Items Addressed This Visit      Endocrine   Diabetes mellitus (HCC)    Other Visit Diagnoses    Blister of left foot without infection, subsequent encounter    -  Primary   Ankle instability, left       Charcot's joint of foot, unspecified laterality       History of amputation of lesser toe of right foot (HCC)         -Patient seen and evaluated -Mechanically debrided keratotic dry blistered skin at left blood blister using a 15 blade, Left blood blister is dry and appears to be improving in nature with some resorption of blood and no active drainage.  Applied Betadine and Band-Aid dressing advised patient to closely monitor and apply Betadine if needed -Continue with shoes that do not rub/sandal -Continue with Augmenitn  -At this time we will hold off on dispensing of brace for at least 1 month or until this area has resolved -Continue with laser for nails -Return to office as scheduled in 2 weeks for wound check or sooner if problems arise.

## 2019-12-02 ENCOUNTER — Ambulatory Visit: Payer: BC Managed Care – PPO | Admitting: Sports Medicine

## 2019-12-02 DIAGNOSIS — Z9889 Other specified postprocedural states: Secondary | ICD-10-CM | POA: Diagnosis not present

## 2019-12-07 ENCOUNTER — Other Ambulatory Visit: Payer: Self-pay

## 2019-12-07 ENCOUNTER — Ambulatory Visit (INDEPENDENT_AMBULATORY_CARE_PROVIDER_SITE_OTHER): Payer: BC Managed Care – PPO | Admitting: Family Medicine

## 2019-12-07 ENCOUNTER — Encounter (INDEPENDENT_AMBULATORY_CARE_PROVIDER_SITE_OTHER): Payer: Self-pay | Admitting: Family Medicine

## 2019-12-07 VITALS — BP 164/78 | HR 85 | Temp 98.1°F | Ht 71.0 in | Wt 259.0 lb

## 2019-12-07 DIAGNOSIS — I10 Essential (primary) hypertension: Secondary | ICD-10-CM | POA: Insufficient documentation

## 2019-12-07 DIAGNOSIS — E1159 Type 2 diabetes mellitus with other circulatory complications: Secondary | ICD-10-CM

## 2019-12-07 DIAGNOSIS — Z6836 Body mass index (BMI) 36.0-36.9, adult: Secondary | ICD-10-CM

## 2019-12-07 DIAGNOSIS — Z9189 Other specified personal risk factors, not elsewhere classified: Secondary | ICD-10-CM | POA: Diagnosis not present

## 2019-12-07 DIAGNOSIS — I152 Hypertension secondary to endocrine disorders: Secondary | ICD-10-CM

## 2019-12-07 DIAGNOSIS — N1831 Chronic kidney disease, stage 3a: Secondary | ICD-10-CM | POA: Diagnosis not present

## 2019-12-07 DIAGNOSIS — E1169 Type 2 diabetes mellitus with other specified complication: Secondary | ICD-10-CM | POA: Diagnosis not present

## 2019-12-07 DIAGNOSIS — Z794 Long term (current) use of insulin: Secondary | ICD-10-CM

## 2019-12-07 MED ORDER — LOSARTAN POTASSIUM 50 MG PO TABS: 50.0000 mg | ORAL_TABLET | Freq: Every day | ORAL | 0 refills | Status: DC

## 2019-12-09 NOTE — Progress Notes (Signed)
Chief Complaint:   OBESITY [name] is here to discuss his progress with his obesity treatment plan along with follow-up of his obesity related diagnoses. [name] is on the Category 1 Plan and states he is following his eating plan approximately 80% of the time. [name] states he is not exercising at this time due to having surgery.  Today's visit was #: 7 Starting weight: 310 lbs Starting date: 09/01/2019 Today's weight: 259 lbs Today's date: 12/07/2019 Total lbs lost to date: 51 lbs Total lbs lost since last in-office visit: 10 lbs Total weight loss percentage to date: -16.45%  Interim History: [name] had shoulder replacement surgery on the right on October 9.  He was able to eat.  He did not eat the best during the past 2.5 weeks.    Plan: He is tolerating ARB. Needs to bring blood pressure and blood sugar log in to the next office visit.  Take all medications as prescribed.  Assessment/Plan:   Meds ordered this encounter  Medications  . losartan (COZAAR) 50 MG tablet    Sig: Take 1 tablet (50 mg total) by mouth daily.    Dispense:  30 tablet    Refill:  0    1. Type 2 diabetes mellitus with other specified complication, with long-term current use of insulin (HCC) At last office visit, we d/c'ed insulin, but after surgery, blood sugars were running 220-280 at one time and for one week they stayed high.  Then they came down again after.   Denies symptoms or concerns.  Blood sugars are now 130s in the morning and 90s in the evenings.  Denies lows.   Plan: Encouraged pt to f/up with PCP re: routine screenings- yrly foot, eye etc and as often as they see fit.  Next step is to cut Amaryl in half, possibly at next office visit.   Continue close home blood sugar monitoring.   Hypoglycemia prevention discussed with him today as well  Lab Results  Component Value Date   HGBA1C 5.4 11/17/2019   HGBA1C 6.6 (H) 09/01/2019   HGBA1C 8.8 (H) 03/13/2019   Lab Results  Component  Value Date   LDLCALC 89 09/01/2019   CREATININE 1.51 (H) 11/17/2019   Lab Results  Component Value Date   INSULIN 28.9 (H) 09/01/2019     2. Hypertension associated with type 2 diabetes mellitus (HCC) [name] has been forgetting his blood pressure medication (has not taken his clonidine for a couple of days).  No Sx or concerns   Plan:   Due to increased creatinine and blood pressure not being at goal, start losartan 1/2 tablet daily for a couple of days and then increase to 1 tablet day to a goal of <130/80.  He will make sure to take his clonidine regularly (after discussing his kidney function with him and the importance of blood pressure/blood sugar control).    - Recommend twice daily on clonidine if BP not at goal, along with continuing amlodipine.    -He will bring BP log into clinic next OV.  BP Readings from Last 3 Encounters:  12/07/19 (!) 164/78  11/19/19 (!) 141/79  11/18/19 135/73   -Start losartan (COZAAR) 50 MG tablet; Take 1 tablet (50 mg total) by mouth daily.  Dispense: 30 tablet; Refill: 0    3. Stage 3a chronic kidney disease (HCC) New.  Worsening.  CMP 2 weeks ago prior to surgery was elevated and worsening.  Plan:  Start ARB.  Counseling regarding decreased renal  function discussed with him and importance of control reviewed with him.  Lab Results  Component Value Date   CREATININE 1.51 (H) 11/17/2019   CREATININE 1.30 (H) 09/01/2019   CREATININE 1.30 05/18/2019   Lab Results  Component Value Date   CREATININE 1.51 (H) 11/17/2019   BUN 22 (H) 11/17/2019   NA 137 11/17/2019   K 4.4 11/17/2019   CL 101 11/17/2019   CO2 24 11/17/2019   -Start losartan (COZAAR) 50 MG tablet; Take 1 tablet (50 mg total) by mouth daily.  Dispense: 30 tablet; Refill: 0    4. At risk for medication nonadherence Mavrik was given approximately 12 minutes of counseling today to help him avoid medication non-adherence.  We discussed importance of taking medications at  a similar time each day and the use of daily pill organizers to help improve medication adherence.    5. Class 2 severe obesity with serious comorbidity and body mass index (BMI) of 36.0 to 36.9 in adult, unspecified obesity type [facility])  [name] is currently in the action stage of change. As such, his goal is to continue with weight loss efforts. He has agreed to the Category 1 Plan.   Exercise goals: All adults should avoid inactivity. Some physical activity is better than none, and adults who participate in any amount of physical activity gain some health benefits.  Behavioral modification strategies: keeping healthy foods in the home, ways to avoid boredom eating, emotional eating strategies and planning for success.  [name] has agreed to follow-up with our clinic in 2 weeks. He was informed of the importance of frequent follow-up visits to maximize his success with intensive lifestyle modifications for his multiple health conditions.   Objective:   Blood pressure (!) 164/78, pulse 85, temperature 98.1 F (36.7 C), height 5\' 11"  (1.803 m), weight 259 lb (117.5 kg), SpO2 98 %. Body mass index is 36.12 kg/m.  General: Cooperative, alert, well developed, in no acute distress. HEENT: Conjunctivae and lids unremarkable. Cardiovascular: Regular rhythm.  Lungs: Normal work of breathing. Neurologic: No focal deficits.   Lab Results  Component Value Date   CREATININE 1.51 (H) 11/17/2019   BUN 22 (H) 11/17/2019   NA 137 11/17/2019   K 4.4 11/17/2019   CL 101 11/17/2019   CO2 24 11/17/2019   Lab Results  Component Value Date   ALT 22 11/17/2019   AST 21 11/17/2019   ALKPHOS 64 11/17/2019   BILITOT 0.9 11/17/2019   Lab Results  Component Value Date   HGBA1C 5.4 11/17/2019   HGBA1C 6.6 (H) 09/01/2019   HGBA1C 8.8 (H) 03/13/2019   HGBA1C (H) 04/26/2010    11.9 (NOTE)                                                                       According to the ADA Clinical Practice  Recommendations for 2011, when HbA1c is used as a screening test:   >=6.5%   Diagnostic of Diabetes Mellitus           (if abnormal result  is confirmed)  5.7-6.4%   Increased risk of developing Diabetes Mellitus  References:Diagnosis and Classification of Diabetes Mellitus,Diabetes Care,2011,34(Suppl 1):S62-S69 and Standards of Medical Care in  Diabetes - 2011,Diabetes Care,2011,34  (Suppl 1):S11-S61.   Lab Results  Component Value Date   INSULIN 28.9 (H) 09/01/2019   Lab Results  Component Value Date   TSH 3.220 09/01/2019   Lab Results  Component Value Date   CHOL 155 09/01/2019   HDL 46 09/01/2019   LDLCALC 89 09/01/2019   TRIG 107 09/01/2019   CHOLHDL 3.4 09/01/2019   Lab Results  Component Value Date   WBC 7.3 11/17/2019   HGB 14.2 11/17/2019   HCT 42.3 11/17/2019   MCV 89.4 11/17/2019   PLT 284 11/17/2019   Attestation Statements:   Reviewed by clinician on day of visit: allergies, medications, problem list, medical history, surgical history, family history, social history, and previous encounter notes.  I, Insurance claims handler, CMA, am acting as Energy manager for [employer], DO.  I have reviewed the above documentation for accuracy and completeness, and I agree with the above. Carlye Grippe, D.O.  The 21st Century Cures Act was signed into law in 2016 which includes the topic of electronic health records.  This provides immediate access to information in MyChart.  This includes consultation notes, operative notes, office notes, lab results and pathology reports.  If you have any questions about what you read please let us know at your next visit so we can discuss your concerns and take corrective action if need be.  We are right here with you.

## 2019-12-10 ENCOUNTER — Ambulatory Visit: Payer: BC Managed Care – PPO | Admitting: Sports Medicine

## 2019-12-15 ENCOUNTER — Ambulatory Visit (INDEPENDENT_AMBULATORY_CARE_PROVIDER_SITE_OTHER): Payer: BC Managed Care – PPO | Admitting: Sports Medicine

## 2019-12-15 ENCOUNTER — Encounter: Payer: Self-pay | Admitting: Sports Medicine

## 2019-12-15 ENCOUNTER — Other Ambulatory Visit: Payer: Self-pay

## 2019-12-15 DIAGNOSIS — M14679 Charcot's joint, unspecified ankle and foot: Secondary | ICD-10-CM

## 2019-12-15 DIAGNOSIS — Z89421 Acquired absence of other right toe(s): Secondary | ICD-10-CM | POA: Diagnosis not present

## 2019-12-15 DIAGNOSIS — E1140 Type 2 diabetes mellitus with diabetic neuropathy, unspecified: Secondary | ICD-10-CM

## 2019-12-15 DIAGNOSIS — S90822D Blister (nonthermal), left foot, subsequent encounter: Secondary | ICD-10-CM | POA: Diagnosis not present

## 2019-12-15 DIAGNOSIS — M25372 Other instability, left ankle: Secondary | ICD-10-CM | POA: Diagnosis not present

## 2019-12-15 NOTE — Progress Notes (Signed)
Subjective: 59 year old diabetic male patient seen in office for wound check on the left foot.  Patient reports that he is doing fine and has not noticed any problems or issues with his left foot.  No other issues noted.  FBS not recorded  Patient Active Problem List   Diagnosis Date Noted  . Stage 3a chronic kidney disease (HCC) 12/07/2019  . Hypertension associated with type 2 diabetes mellitus (HCC) 12/07/2019  . At risk for medication nonadherence 12/07/2019  . Coronary artery disease involving native coronary artery of native heart without angina pectoris 11/03/2019  . Severe nonproliferative diabetic retinopathy of right eye, with macular edema, associated with type 2 diabetes mellitus (HCC) 07/27/2019  . Severe nonproliferative diabetic retinopathy of left eye, with macular edema, associated with type 2 diabetes mellitus (HCC) 07/27/2019  . Hypertensive retinopathy of both eyes 07/27/2019  . Cellulitis of right foot   . S/P foot surgery, right   . MSSA bacteremia   . Osteomyelitis of fifth toe of right foot (HCC) 03/13/2019  . DKA (diabetic ketoacidosis) (HCC) 03/13/2019  . Acute osteomyelitis of toe of right foot (HCC)   . Demand ischemia (HCC)   . Hyperglycemia   . Laryngopharyngeal reflux (LPR) 02/07/2016  . GERD (gastroesophageal reflux disease) 12/27/2015  . Arthritis 11/16/2015  . Diabetes mellitus (HCC) 11/16/2015  . Hyperlipidemia 11/16/2015  . Localized edema 11/16/2015  . Morbid obesity (HCC) 05/15/2007  . ADD 05/15/2007  . Obesity, diabetes, and hypertension syndrome (HCC) 05/15/2007  . Allergic rhinitis 05/15/2007  . Obstructive sleep apnea 05/15/2007  . COUGH 05/15/2007    Objective General: No acute distress  Right Lower extremity: Amp wound site healed on right,S/p 5th partial ray amputation.  Left lower extremity, preulcerative lesion/blood blister that is dry and stable with no signs of infection.   Nails x9 are well manicured.  No acute signs of  infection.   Problem List Items Addressed This Visit      Endocrine   Diabetes mellitus (HCC)    Other Visit Diagnoses    Blister of left foot without infection, subsequent encounter    -  Primary   Prematurely resolved   Ankle instability, left       Charcot's joint of foot, unspecified laterality       History of amputation of lesser toe of right foot (HCC)         -Patient seen and evaluated -Mechanically debrided keratotic dry blistered skin at left blood blister using a 15 blade, Left foot blister remains dry with no signs of drainage advised patient to closely monitor and feel dry blood has completely resolved in this area -Continue with shoes that do not rub/sandal -Continue with laser treatment for nails -Patient to return next month for brace reevaluation and wound check

## 2019-12-18 ENCOUNTER — Ambulatory Visit (INDEPENDENT_AMBULATORY_CARE_PROVIDER_SITE_OTHER): Payer: BC Managed Care – PPO | Admitting: *Deleted

## 2019-12-18 ENCOUNTER — Other Ambulatory Visit: Payer: Self-pay

## 2019-12-18 DIAGNOSIS — B351 Tinea unguium: Secondary | ICD-10-CM

## 2019-12-18 NOTE — Progress Notes (Signed)
Patient presents today for the 6th laser treatment. Diagnosed with mycotic nail infection by Dr. Marylene Land.   Toenail most affected are hallux nails bilateral and 2nd right. The coloration of the nails look so much better.  The 3rd nail right is completely clear.  All other systems are negative.  Nails were filed thin. Laser therapy was administered to 1st bilateral and 2nd nail right and patient tolerated the treatment well. All safety precautions were in place.   Patient has completed the oral terbinafine.  Patient has completed the recommended laser treatments. He will follow up with Dr. Marylene Land in 3 months to evaluate progress.    ~Final pic of nails taken today~

## 2019-12-21 ENCOUNTER — Encounter (INDEPENDENT_AMBULATORY_CARE_PROVIDER_SITE_OTHER): Payer: Self-pay | Admitting: Ophthalmology

## 2019-12-21 ENCOUNTER — Other Ambulatory Visit: Payer: Self-pay

## 2019-12-21 ENCOUNTER — Encounter (INDEPENDENT_AMBULATORY_CARE_PROVIDER_SITE_OTHER): Payer: Self-pay | Admitting: Family Medicine

## 2019-12-21 ENCOUNTER — Ambulatory Visit (INDEPENDENT_AMBULATORY_CARE_PROVIDER_SITE_OTHER): Payer: BC Managed Care – PPO | Admitting: Family Medicine

## 2019-12-21 ENCOUNTER — Ambulatory Visit (INDEPENDENT_AMBULATORY_CARE_PROVIDER_SITE_OTHER): Payer: BC Managed Care – PPO | Admitting: Ophthalmology

## 2019-12-21 VITALS — BP 115/70 | HR 76 | Temp 98.0°F | Ht 71.0 in | Wt 261.0 lb

## 2019-12-21 DIAGNOSIS — E1159 Type 2 diabetes mellitus with other circulatory complications: Secondary | ICD-10-CM | POA: Diagnosis not present

## 2019-12-21 DIAGNOSIS — E1169 Type 2 diabetes mellitus with other specified complication: Secondary | ICD-10-CM

## 2019-12-21 DIAGNOSIS — E113411 Type 2 diabetes mellitus with severe nonproliferative diabetic retinopathy with macular edema, right eye: Secondary | ICD-10-CM

## 2019-12-21 DIAGNOSIS — E559 Vitamin D deficiency, unspecified: Secondary | ICD-10-CM

## 2019-12-21 DIAGNOSIS — Z9189 Other specified personal risk factors, not elsewhere classified: Secondary | ICD-10-CM

## 2019-12-21 DIAGNOSIS — E11319 Type 2 diabetes mellitus with unspecified diabetic retinopathy without macular edema: Secondary | ICD-10-CM | POA: Diagnosis not present

## 2019-12-21 DIAGNOSIS — N1831 Chronic kidney disease, stage 3a: Secondary | ICD-10-CM

## 2019-12-21 DIAGNOSIS — I152 Hypertension secondary to endocrine disorders: Secondary | ICD-10-CM

## 2019-12-21 DIAGNOSIS — E113412 Type 2 diabetes mellitus with severe nonproliferative diabetic retinopathy with macular edema, left eye: Secondary | ICD-10-CM

## 2019-12-21 DIAGNOSIS — Z6836 Body mass index (BMI) 36.0-36.9, adult: Secondary | ICD-10-CM

## 2019-12-21 MED ORDER — VITAMIN D (ERGOCALCIFEROL) 1.25 MG (50000 UNIT) PO CAPS: 50000.0000 [IU] | ORAL_CAPSULE | ORAL | 0 refills | Status: DC

## 2019-12-21 NOTE — Assessment & Plan Note (Signed)
Much less CSME status post focal laser, improved blood sugar control and some recent weight loss will assist in this matter

## 2019-12-21 NOTE — Assessment & Plan Note (Addendum)
Much less CSME status post focal laser temporally, improved blood sugar control and some recent weight loss will assist in this matter.   Increase CSME inferiorly left eye, will need to Deliver focal laser photocoagulation inferior to the fovea in the coming weeks

## 2019-12-21 NOTE — Patient Instructions (Signed)
Patient instructed to report promptly new onset visual acuity decline or distortions that are consistent and persistent

## 2019-12-21 NOTE — Progress Notes (Signed)
12/21/2019     CHIEF COMPLAINT Patient presents for Retina Follow Up   HISTORY OF PRESENT ILLNESS: [name] is a 59 y.o. male who presents to the clinic today for:   HPI    Retina Follow Up    Patient presents with  Diabetic Retinopathy.  In both eyes.  This started 4 months ago.  Severity is mild.  Duration of 4 months.  Since onset it is stable.          Comments    4 Month F/U OU  Pt sts when blood sugar fluctuates, VA OU gets cloudy. No new symptoms reported OU. A1c: 5.4, 09/2019 LBS: 126 this AM       Last edited by Ileana Roup, COA on 12/21/2019  8:04 AM. (History)      Referring physician: Marden Noble, MD [address] [place],  [place] [address]  HISTORICAL INFORMATION:   Selected notes from the MEDICAL RECORD NUMBER    Lab Results  Component Value Date   HGBA1C 5.4 11/17/2019     CURRENT MEDICATIONS: No current outpatient medications on file. (Ophthalmic Drugs)   No current facility-administered medications for this visit. (Ophthalmic Drugs)   Current Outpatient Medications (Other)  Medication Sig  . albuterol (VENTOLIN HFA) 108 (90 Base) MCG/ACT inhaler Inhale 2 puffs into the lungs every 6 (six) hours as needed for wheezing or shortness of breath.  Marland Kitchen amLODipine (NORVASC) 5 MG tablet Take 5 mg by mouth daily.  Marland Kitchen amoxicillin-clavulanate (AUGMENTIN) 875-125 MG tablet Take 1 tablet by mouth 2 (two) times daily.  Marland Kitchen aspirin EC 81 MG tablet Take 81 mg by mouth daily.  Marland Kitchen atorvastatin (LIPITOR) 10 MG tablet Take 1 tablet (10 mg total) by mouth daily.  . BD INSULIN SYRINGE U/F 31G X 5/16" 1 ML MISC See admin instructions.  . cetirizine (ZYRTEC) 10 MG tablet Take 10 mg by mouth daily.  . Cholecalciferol (VITAMIN D) 50 MCG (2000 UT) tablet Take 4,000 Units by mouth daily.   . cloNIDine (CATAPRES) 0.2 MG tablet Take 0.2 mg by mouth every evening.   Marland Kitchen glimepiride (AMARYL) 4 MG tablet Take 4 mg by mouth daily with breakfast.   .  loratadine (CLARITIN) 10 MG tablet Take 10 mg by mouth daily.   Marland Kitchen losartan (COZAAR) 50 MG tablet Take 1 tablet (50 mg total) by mouth daily.  . Magnesium 400 MG CAPS Take 400 mg by mouth daily.   . metFORMIN (GLUCOPHAGE-XR) 500 MG 24 hr tablet Take 1,000 mg by mouth 2 (two) times daily.   . pantoprazole (PROTONIX) 40 MG tablet TAKE 1 TABLET BY MOUTH TWICE DAILY (Patient taking differently: Take 40 mg by mouth 2 (two) times daily. )  . Spacer/Aero-Holding Chambers (AEROCHAMBER MV) inhaler Use as instructed (Patient taking differently: as needed. Use as instructed for cough)  . vitamin B-12 (CYANOCOBALAMIN) 500 MCG tablet Take 500 mcg by mouth daily.   . Vitamin D, Ergocalciferol, (DRISDOL) 1.25 MG (50000 UNIT) CAPS capsule Take 1 capsule (50,000 Units total) by mouth every 7 (seven) days.   No current facility-administered medications for this visit. (Other)      REVIEW OF SYSTEMS:    ALLERGIES No Known Allergies  PAST MEDICAL HISTORY Past Medical History:  Diagnosis Date  . ADD (attention deficit disorder)   . Anginal pain (HCC) 2012  . Arthritis, shoulder region   . B12 deficiency   . Back pain   . Coronary artery disease   .  Diabetes (HCC)   . Diabetic retinopathy (HCC)   . GERD (gastroesophageal reflux disease)   . H/O heart artery stent 04/2010  . Hyperlipidemia   . Hypertension   . Joint pain   . Knee pain   . Neuropathy   . NSTEMI (non-ST elevated myocardial infarction) (HCC)   . Obesity   . Osteoarthritis   . Persistent cough    stopped for a year  . Sleep apnea   . Swallowing difficulty   . Swelling of both lower extremities    Past Surgical History:  Procedure Laterality Date  . ACHILLES TENDON REPAIR Right   . AMPUTATION TOE Right 03/14/2019   Procedure: AMPUTATION TOE, 5th toe;  Surgeon: Asencion IslamStover, Titorya, DPM;  Location: [facility] OR;  Service: Podiatry;  Laterality: Right;  . heart stent  2012   x1  . INCISION AND DRAINAGE Right 03/14/2019   Procedure:  INCISION AND DRAINAGE;  Surgeon: Asencion IslamStover, Titorya, DPM;  Location: [facility] OR;  Service: Podiatry;  Laterality: Right;  . SHOULDER ARTHROSCOPY Right   . TEE WITHOUT CARDIOVERSION N/A 03/17/2019   Procedure: TRANSESOPHAGEAL ECHOCARDIOGRAM (TEE);  Surgeon: Chilton Siandolph, Tiffany, MD;  [facility];  Service: Cardiovascular;  Laterality: N/A;  . TOE SURGERY Right 06/2018   for a hammer toe  . TOTAL SHOULDER ARTHROPLASTY Right 11/19/2019   Procedure: TOTAL SHOULDER ARTHROPLASTY;  Surgeon: Jones Broomhandler, Justin, MD;  Location: [facility]S;  Service: Orthopedics;  Laterality: Right;  . UVULOPALATOPHARYNGOPLASTY      FAMILY HISTORY Family History  Problem Relation Age of Onset  . Cancer Other   . Diabetes Other   . Heart disease Other   . Breast cancer Mother   . Cancer Mother   . Heart disease Father   . Diabetes Father   . Stroke Father   . Leukemia Brother   . Asthma Maternal Uncle     SOCIAL HISTORY Social History   Tobacco Use  . Smoking status: Passive Smoke Exposure - Never Smoker  . Smokeless tobacco: Never Used  . Tobacco comment: Both parents growing up.   Vaping Use  . Vaping Use: Never used  Substance Use Topics  . Alcohol use: Yes    Comment: 4-5 on weekends  . Drug use: No         OPHTHALMIC EXAM:  Base Eye Exam    Visual Acuity (ETDRS)      Right Left   Dist cc 20/20 20/20 -2   Correction: Glasses       Tonometry (Tonopen, 8:04 AM)      Right Left   Pressure 16 12       Pupils      Pupils Dark Light Shape React APD   Right PERRL 4 3 Round Brisk None   Left PERRL 4 3 Round Brisk None       Visual Fields (Counting fingers)      Left Right    Full Full       Extraocular Movement      Right Left    Full Full       Neuro/Psych    Oriented x3: Yes   Mood/Affect: Normal       Dilation    Both eyes: 1.0% Mydriacyl, 2.5% Phenylephrine @ 8:08 AM        Slit Lamp and Fundus Exam    External Exam      Right Left   External Normal Normal        Slit Lamp Exam  Right Left   Lids/Lashes Normal Normal   Conjunctiva/Sclera White and quiet White and quiet   Cornea Clear Clear   Anterior Chamber Deep and quiet Deep and quiet   Iris Round and reactive Round and reactive   Lens 1+ Nuclear sclerosis 1+ Nuclear sclerosis   Anterior Vitreous Normal Normal       Fundus Exam      Right Left   Posterior Vitreous Normal Normal   Disc Normal Normal   C/D Ratio 0.35 0.35   Macula Focal laser scars Focal laser scars   Vessels NPDR-Severe NPDR-Severe   Periphery Normal Normal          IMAGING AND PROCEDURES  Imaging and Procedures for 12/21/19  OCT, Retina - OU - Both Eyes       Right Eye Quality was good. Scan locations included subfoveal. Central Foveal Thickness: 318. Progression has improved. Findings include abnormal foveal contour.   Left Eye Quality was good. Scan locations included subfoveal. Central Foveal Thickness: 331. Progression has worsened. Findings include abnormal foveal contour.   Notes CSME OD vastly improved  OS, center involvement of CSME has subsided left eye yet there is a new region of increasing thickening inferior to the fovea, will need to deliver focal laser treatment soon                ASSESSMENT/PLAN:  Severe nonproliferative diabetic retinopathy of left eye, with macular edema, associated with type 2 diabetes mellitus (HCC) Much less CSME status post focal laser temporally, improved blood sugar control and some recent weight loss will assist in this matter.   Increase CSME inferiorly left eye, will need to Deliver focal laser photocoagulation inferior to the fovea in the coming weeks  Severe nonproliferative diabetic retinopathy of right eye, with macular edema, associated with type 2 diabetes mellitus (HCC) Much less CSME status post focal laser, improved blood sugar control and some recent weight loss will assist in this matter      ICD-10-CM   1. Severe nonproliferative  diabetic retinopathy of left eye, with macular edema, associated with type 2 diabetes mellitus (HCC)  E11.3412 OCT, Retina - OU - Both Eyes  2. Severe nonproliferative diabetic retinopathy of right eye, with macular edema, associated with type 2 diabetes mellitus (HCC)  E11.3411 OCT, Retina - OU - Both Eyes    1. Severe nonproliferative diabetic retinopathy however now with excellent blood sugar control with hemoglobin A1c in the mid fives.  2.  focal laser photocoagulation for noncenter involved CSME has stabilized condition OD. We will continue to observe  Slight increase in CSME inferior to the fovea left eye, noncenter involved, will deliver focal laser photocoagulation inferiorly next visit  3. Mild NSC OU, not visually significant  Ophthalmic Meds Ordered this visit:  No orders of the defined types were placed in this encounter.      Return in about 2 weeks (around 01/04/2020) for OCT, OS, FOCAL.  Patient Instructions  Patient instructed to report promptly new onset visual acuity decline or distortions that are consistent and persistent    Explained the diagnoses, plan, and follow up with the patient and they expressed understanding.  Patient expressed understanding of the importance of proper follow up care.   Alford Highland Fonnie Crookshanks M.D. Diseases & Surgery of the Retina and Vitreous [facility] 12/21/19     Abbreviations: M myopia (nearsighted); A astigmatism; H hyperopia (farsighted); P presbyopia; Mrx spectacle prescription;  CTL contact lenses; OD right eye;  OS left eye; OU both eyes  XT exotropia; ET esotropia; PEK punctate epithelial keratitis; PEE punctate epithelial erosions; DES dry eye syndrome; MGD meibomian gland dysfunction; ATs artificial tears; PFAT's preservative free artificial tears; NSC nuclear sclerotic cataract; PSC posterior subcapsular cataract; ERM epi-retinal membrane; PVD posterior vitreous detachment; RD retinal detachment; DM diabetes  mellitus; DR diabetic retinopathy; NPDR non-proliferative diabetic retinopathy; PDR proliferative diabetic retinopathy; CSME clinically significant macular edema; DME diabetic macular edema; dbh dot blot hemorrhages; CWS cotton wool spot; POAG primary open angle glaucoma; C/D cup-to-disc ratio; HVF humphrey visual field; GVF goldmann visual field; OCT optical coherence tomography; IOP intraocular pressure; BRVO Branch retinal vein occlusion; CRVO central retinal vein occlusion; CRAO central retinal artery occlusion; BRAO branch retinal artery occlusion; RT retinal tear; SB scleral buckle; PPV pars plana vitrectomy; VH Vitreous hemorrhage; PRP panretinal laser photocoagulation; IVK intravitreal kenalog; VMT vitreomacular traction; MH Macular hole;  NVD neovascularization of the disc; NVE neovascularization elsewhere; AREDS age related eye disease study; ARMD age related macular degeneration; POAG primary open angle glaucoma; EBMD epithelial/anterior basement membrane dystrophy; ACIOL anterior chamber intraocular lens; IOL intraocular lens; PCIOL posterior chamber intraocular lens; Phaco/IOL phacoemulsification with intraocular lens placement; PRK photorefractive keratectomy; LASIK laser assisted in situ keratomileusis; HTN hypertension; DM diabetes mellitus; COPD chronic obstructive pulmonary disease

## 2019-12-22 DIAGNOSIS — E559 Vitamin D deficiency, unspecified: Secondary | ICD-10-CM | POA: Insufficient documentation

## 2019-12-22 DIAGNOSIS — Z9189 Other specified personal risk factors, not elsewhere classified: Secondary | ICD-10-CM | POA: Insufficient documentation

## 2019-12-23 NOTE — Progress Notes (Signed)
Chief Complaint:   OBESITY [name] is here to discuss his progress with his obesity treatment plan along with follow-up of his obesity related diagnoses. [name] is on the Category 1 Plan and states he is following his eating plan approximately 75% of the time. [name] states he is exercising for 0 minutes 0 times per week.  Today's visit was #: 8 Starting weight: 310 lbs Starting date: 09/01/2019 Today's weight: 261 lbs Today's date: 12/21/2019 Total lbs lost to date: 49 lbs Total lbs lost since last in-office visit: +2 lbs Total weight loss percentage to date: -15.81%  Interim History: [name] went away with his girlfriend for a long weekend this past weekend.  He says he ate out for each meal.  Also, he "took up a ginger snap cookie fix".  He buys them and brings them home.  He says he he is eating 4 a day or more, at times 8 per day.  He knows he needs to stop due to diabetes, and this hinders his weight loss efforts.  He tells me that    Assessment/Plan:    1. Type 2 diabetes mellitus with other specified complication, without long-term current use of insulin (HCC) We stopped pt's insulin 2 OV's ago.  FBS 120s-130s in the morning.  Blood sugar 54 once before dinnertime.  He had some vision changes and felt "weird" but did not eat/ skipped meal/ foods and hence knows why it happened.   He recognized symptoms and ate a cookie.  A1c was 8.8 around 9 months ago and 5.4 about 1 month ago.  Plan:  Importance of not skipping and eating on a regular basis every 4 hours or so was stressed to him to day.  Hypoglycemia prevention and care of discussed with him today.  Since he has only had a low BS once, will continue current medication, but in the future will advise to wean Amaryl.  Cont metformin.   -Reck A1c around 02/17/19   Lab Results  Component Value Date   HGBA1C 5.4 11/17/2019   HGBA1C 6.6 (H) 09/01/2019   HGBA1C 8.8 (H) 03/13/2019   Lab Results  Component Value Date   LDLCALC  89 09/01/2019   CREATININE 1.51 (H) 11/17/2019   Lab Results  Component Value Date   INSULIN 28.9 (H) 09/01/2019      2. Hypertension associated with type 2 diabetes mellitus (HCC) 3. Stage 3a chronic kidney disease- assoc w DM and HTN (HCC) At last office visit, 164/78 or so and serum crt elevated w/ stage III CKD, GFR 51.    We started losartan, but since that change, BP has been running 120s-130s/70s/80s .    - Over the weekend, when he ate poorly, his blood pressure went up.  Tolerating medication well without side effects.  Denies concerns.  Plan:   Blood pressure is at goal.  Continue losartan 50 mg daily and Norvasc 5 mg daily along with his clonidine which he is to take twice daily per last OV and our discussions..  Home blood pressure monitoring daily recommended.  -  Keep log and bring in to next appointment.   -  Continue prudent nutritional plan and weight loss.  - Will continue to monitor renal function.  Avoid NSAIDS.  Control BP and BS!  - reck CMP with GFR around 02/06/20 or early Jan AFTER STARTING LOSARTAN 10/25  BP Readings from Last 3 Encounters:  12/21/19 115/70  12/07/19 (!) 164/78  11/19/19 (!) 141/79  4. Diabetic retinopathy associated with type 2 diabetes mellitus, macular edema presence unspecified, unspecified laterality, unspecified retinopathy severity (HCC) Recently saw ophthalmologist (earlier this morning) and he noted retinopathy, but not worse than prior.  Plan:  - Importance of controlled blood sugar was discussed with him.  Continue yearly eye and foot exams with PCP/specialists.  ( reminded of need for foot exams also yrly also ) - Continue weight loss and prudent nutritional plan.    5. Vitamin D deficiency [name]'s Vitamin D level was 31.7 on 09/01/2019. He is currently taking prescription vitamin D 50,000 IU each week. He denies nausea, vomiting or muscle weakness.  Plan:  Refill vitamin D, as per below. -- Reck VIT D and FLP near  future/ when next bldwrk drawn  -Refill Vitamin D, Ergocalciferol, (DRISDOL) 1.25 MG (50000 UNIT) CAPS capsule; Take 1 capsule (50,000 Units total) by mouth every 7 (seven) days.  Dispense: 4 capsule; Refill: 0    6. At risk for hypoglycemia [name] was given approximately 10 minutes of counseling today regarding prevention of hypoglycemia.  He was advised of symptoms of hypoglycemia.  [name] was instructed to avoid skipping meals, and to eat regular protein-rich meals as prescribed on the meal plan.  Have readily available- low calorie snacks as needed ie- [employer] fruit snack packs if BS goes too low.     7. Class 2 severe obesity with serious comorbidity and body mass index (BMI) of 36.0 to 36.9 in adult, unspecified obesity type [facility])  [name] is currently in the action stage of change. As such, his goal is to continue with weight loss efforts. He has agreed to the Category 1 Plan.   Exercise goals: All adults should avoid inactivity. Start moving--> Some physical activity is better than none, and adults who participate in any amount of physical activity gain some health benefits.  Behavioral modification strategies: keeping healthy foods in the home, better snacking choices and travel eating strategies.  [name] has agreed to follow-up with our clinic in 2 weeks.  He was informed of the importance of frequent follow-up visits to maximize his success with intensive lifestyle modifications for his multiple health conditions.     Objective:   Blood pressure 115/70, pulse 76, temperature 98 F (36.7 C), height 5\' 11"  (1.803 m), weight 261 lb (118.4 kg), SpO2 98 %. Body mass index is 36.4 kg/m.  General: Cooperative, alert, well developed, in no acute distress. HEENT: Conjunctivae and lids unremarkable. Cardiovascular: Regular rhythm.  Lungs: Normal work of breathing. Neurologic: No focal deficits.   Lab Results  Component Value Date   CREATININE 1.51 (H) 11/17/2019   BUN 22 (H)  11/17/2019   NA 137 11/17/2019   K 4.4 11/17/2019   CL 101 11/17/2019   CO2 24 11/17/2019   Lab Results  Component Value Date   ALT 22 11/17/2019   AST 21 11/17/2019   ALKPHOS 64 11/17/2019   BILITOT 0.9 11/17/2019   Lab Results  Component Value Date   HGBA1C 5.4 11/17/2019   HGBA1C 6.6 (H) 09/01/2019   HGBA1C 8.8 (H) 03/13/2019   HGBA1C (H) 04/26/2010    11.9 (NOTE)  According to the ADA Clinical Practice Recommendations for 2011, when HbA1c is used as a screening test:   >=6.5%   Diagnostic of Diabetes Mellitus           (if abnormal result  is confirmed)  5.7-6.4%   Increased risk of developing Diabetes Mellitus  References:Diagnosis and Classification of Diabetes Mellitus,Diabetes Care,2011,34(Suppl 1):S62-S69 and Standards of Medical Care in         Diabetes - 2011,Diabetes Care,2011,34  (Suppl 1):S11-S61.   Lab Results  Component Value Date   INSULIN 28.9 (H) 09/01/2019   Lab Results  Component Value Date   TSH 3.220 09/01/2019   Lab Results  Component Value Date   CHOL 155 09/01/2019   HDL 46 09/01/2019   LDLCALC 89 09/01/2019   TRIG 107 09/01/2019   CHOLHDL 3.4 09/01/2019   Lab Results  Component Value Date   WBC 7.3 11/17/2019   HGB 14.2 11/17/2019   HCT 42.3 11/17/2019   MCV 89.4 11/17/2019   PLT 284 11/17/2019      Attestation Statements:   Reviewed by clinician on day of visit: allergies, medications, problem list, medical history, surgical history, family history, social history, and previous encounter notes.  I, Insurance claims handler, CMA, am acting as Energy manager for [employer], DO.  I have reviewed the above documentation for accuracy and completeness, and I agree with the above. Carlye Grippe, D.O.  The 21st Century Cures Act was signed into law in 2016 which includes the topic of electronic health records.  This provides immediate access to information in MyChart.   This includes consultation notes, operative notes, office notes, lab results and pathology reports.  If you have any questions about what you read please let us know at your next visit so we can discuss your concerns and take corrective action if need be.  We are right here with you.

## 2019-12-26 DIAGNOSIS — E11319 Type 2 diabetes mellitus with unspecified diabetic retinopathy without macular edema: Secondary | ICD-10-CM | POA: Insufficient documentation

## 2019-12-30 DIAGNOSIS — Z9889 Other specified postprocedural states: Secondary | ICD-10-CM | POA: Diagnosis not present

## 2019-12-31 DIAGNOSIS — M6281 Muscle weakness (generalized): Secondary | ICD-10-CM | POA: Diagnosis not present

## 2019-12-31 DIAGNOSIS — M25611 Stiffness of right shoulder, not elsewhere classified: Secondary | ICD-10-CM | POA: Diagnosis not present

## 2019-12-31 DIAGNOSIS — M25511 Pain in right shoulder: Secondary | ICD-10-CM | POA: Diagnosis not present

## 2019-12-31 DIAGNOSIS — Z9889 Other specified postprocedural states: Secondary | ICD-10-CM | POA: Diagnosis not present

## 2020-01-04 ENCOUNTER — Other Ambulatory Visit: Payer: Self-pay

## 2020-01-04 ENCOUNTER — Ambulatory Visit (INDEPENDENT_AMBULATORY_CARE_PROVIDER_SITE_OTHER): Payer: BC Managed Care – PPO | Admitting: Family Medicine

## 2020-01-04 ENCOUNTER — Encounter (INDEPENDENT_AMBULATORY_CARE_PROVIDER_SITE_OTHER): Payer: Self-pay | Admitting: Family Medicine

## 2020-01-04 VITALS — BP 122/75 | HR 80 | Temp 98.0°F | Ht 71.0 in | Wt 257.0 lb

## 2020-01-04 DIAGNOSIS — I152 Hypertension secondary to endocrine disorders: Secondary | ICD-10-CM

## 2020-01-04 DIAGNOSIS — E1169 Type 2 diabetes mellitus with other specified complication: Secondary | ICD-10-CM

## 2020-01-04 DIAGNOSIS — Z9189 Other specified personal risk factors, not elsewhere classified: Secondary | ICD-10-CM | POA: Diagnosis not present

## 2020-01-04 DIAGNOSIS — Z6835 Body mass index (BMI) 35.0-35.9, adult: Secondary | ICD-10-CM

## 2020-01-04 DIAGNOSIS — E1159 Type 2 diabetes mellitus with other circulatory complications: Secondary | ICD-10-CM | POA: Diagnosis not present

## 2020-01-04 MED ORDER — LOSARTAN POTASSIUM 50 MG PO TABS: 50.0000 mg | ORAL_TABLET | Freq: Every day | ORAL | 0 refills | Status: DC

## 2020-01-04 MED ORDER — OZEMPIC (0.25 OR 0.5 MG/DOSE) 2 MG/1.5ML ~~LOC~~ SOPN: 0.5000 mg | PEN_INJECTOR | SUBCUTANEOUS | 0 refills | Status: DC

## 2020-01-05 ENCOUNTER — Ambulatory Visit (INDEPENDENT_AMBULATORY_CARE_PROVIDER_SITE_OTHER): Payer: BC Managed Care – PPO | Admitting: Ophthalmology

## 2020-01-05 ENCOUNTER — Encounter (INDEPENDENT_AMBULATORY_CARE_PROVIDER_SITE_OTHER): Payer: Self-pay | Admitting: Ophthalmology

## 2020-01-05 DIAGNOSIS — E113412 Type 2 diabetes mellitus with severe nonproliferative diabetic retinopathy with macular edema, left eye: Secondary | ICD-10-CM | POA: Diagnosis not present

## 2020-01-05 NOTE — Assessment & Plan Note (Signed)
Noncentral involved CSME OS, leakage temporal and inferior to the fovea documented stabilized, for focal treatment OS today for residual CSME

## 2020-01-05 NOTE — Progress Notes (Signed)
01/05/2020     CHIEF COMPLAINT Patient presents for Retina Follow Up   HISTORY OF PRESENT ILLNESS: [name] is a 59 y.o. male who presents to the clinic today for:   HPI    Retina Follow Up    Patient presents with  Diabetic Retinopathy.  In left eye.  This started 2 weeks ago.  Severity is mild.  Duration of 2 weeks.  Since onset it is stable.          Comments    2 Week Focal OS  Pt denies noticeable changes to [place] OU since last visit. Pt denies ocular pain, flashes of light, or floaters OU.  LBS: 115 this AM       Last edited by Ileana Roup, COA on 01/05/2020  8:11 AM. (History)      Referring physician: Marden Noble, MD [address] [place],  [place] [address]  HISTORICAL INFORMATION:   Selected notes from the MEDICAL RECORD NUMBER    Lab Results  Component Value Date   HGBA1C 5.4 11/17/2019     CURRENT MEDICATIONS: No current outpatient medications on file. (Ophthalmic Drugs)   No current facility-administered medications for this visit. (Ophthalmic Drugs)   Current Outpatient Medications (Other)  Medication Sig  . albuterol (VENTOLIN HFA) 108 (90 Base) MCG/ACT inhaler Inhale 2 puffs into the lungs every 6 (six) hours as needed for wheezing or shortness of breath.  Marland Kitchen amLODipine (NORVASC) 5 MG tablet Take 5 mg by mouth daily.  Marland Kitchen aspirin EC 81 MG tablet Take 81 mg by mouth daily.  Marland Kitchen atorvastatin (LIPITOR) 10 MG tablet Take 1 tablet (10 mg total) by mouth daily.  . cetirizine (ZYRTEC) 10 MG tablet Take 10 mg by mouth daily.  . Cholecalciferol (VITAMIN D) 50 MCG (2000 UT) tablet Take 4,000 Units by mouth daily.   . cloNIDine (CATAPRES) 0.2 MG tablet Take 0.2 mg by mouth every evening.   . loratadine (CLARITIN) 10 MG tablet Take 10 mg by mouth daily.   Marland Kitchen losartan (COZAAR) 50 MG tablet Take 1 tablet (50 mg total) by mouth daily.  . Magnesium 400 MG CAPS Take 400 mg by mouth daily.   . metFORMIN (GLUCOPHAGE-XR) 500 MG 24 hr tablet Take  1,000 mg by mouth 2 (two) times daily.   . pantoprazole (PROTONIX) 40 MG tablet TAKE 1 TABLET BY MOUTH TWICE DAILY (Patient taking differently: Take 40 mg by mouth 2 (two) times daily. )  . Semaglutide,0.25 or 0.5MG /DOS, (OZEMPIC, 0.25 OR 0.5 MG/DOSE,) 2 MG/1.5ML SOPN Inject 0.5 mg into the skin once a week.  Marland Kitchen Spacer/Aero-Holding Chambers (AEROCHAMBER MV) inhaler Use as instructed (Patient taking differently: as needed. Use as instructed for cough)  . vitamin B-12 (CYANOCOBALAMIN) 500 MCG tablet Take 500 mcg by mouth daily.   . Vitamin D, Ergocalciferol, (DRISDOL) 1.25 MG (50000 UNIT) CAPS capsule Take 1 capsule (50,000 Units total) by mouth every 7 (seven) days.   No current facility-administered medications for this visit. (Other)      REVIEW OF SYSTEMS:    ALLERGIES No Known Allergies  PAST MEDICAL HISTORY Past Medical History:  Diagnosis Date  . ADD (attention deficit disorder)   . Anginal pain (HCC) 2012  . Arthritis, shoulder region   . B12 deficiency   . Back pain   . Coronary artery disease   . Diabetes (HCC)   . Diabetic retinopathy (HCC)   . GERD (gastroesophageal reflux disease)   . H/O heart artery stent  04/2010  . Hyperlipidemia   . Hypertension   . Joint pain   . Knee pain   . Neuropathy   . NSTEMI (non-ST elevated myocardial infarction) (HCC)   . Obesity   . Osteoarthritis   . Persistent cough    stopped for a year  . Sleep apnea   . Swallowing difficulty   . Swelling of both lower extremities    Past Surgical History:  Procedure Laterality Date  . ACHILLES TENDON REPAIR Right   . AMPUTATION TOE Right 03/14/2019   Procedure: AMPUTATION TOE, 5th toe;  Surgeon: [name], DPM;  Location: [facility] OR;  Service: Podiatry;  Laterality: Right;  . heart stent  2012   x1  . INCISION AND DRAINAGE Right 03/14/2019   Procedure: INCISION AND DRAINAGE;  Surgeon: [name], DPM;  Location: [facility] OR;  Service: Podiatry;  Laterality: Right;  . SHOULDER  ARTHROSCOPY Right   . TEE WITHOUT CARDIOVERSION N/A 03/17/2019   Procedure: TRANSESOPHAGEAL ECHOCARDIOGRAM (TEE);  Surgeon: Chilton Si, MD;  Location: [facility];  Service: Cardiovascular;  Laterality: N/A;  . TOE SURGERY Right 06/2018   for a hammer toe  . TOTAL SHOULDER ARTHROPLASTY Right 11/19/2019   Procedure: TOTAL SHOULDER ARTHROPLASTY;  Surgeon: [name], MD;  Location: [facility] ORS;  Service: Orthopedics;  Laterality: Right;  . UVULOPALATOPHARYNGOPLASTY      FAMILY HISTORY Family History  Problem Relation Age of Onset  . Cancer Other   . Diabetes Other   . Heart disease Other   . Breast cancer Mother   . Cancer Mother   . Heart disease Father   . Diabetes Father   . Stroke Father   . Leukemia Brother   . Asthma Maternal Uncle     SOCIAL HISTORY Social History   Tobacco Use  . Smoking status: Passive Smoke Exposure - Never Smoker  . Smokeless tobacco: Never Used  . Tobacco comment: Both parents growing up.   Vaping Use  . Vaping Use: Never used  Substance Use Topics  . Alcohol use: Yes    Comment: 4-5 on weekends  . Drug use: No         OPHTHALMIC EXAM:  Base Eye Exam    Visual Acuity (ETDRS)      Right Left   Dist cc 20/20 20/20   Correction: Glasses       Tonometry (Tonopen, 8:11 AM)      Right Left   Pressure 21 18       Pupils      Pupils Dark Light Shape React APD   Right PERRL 4 3 Round Brisk None   Left PERRL 4 3 Round Brisk None       Visual Fields (Counting fingers)      Left Right    Full Full       Extraocular Movement      Right Left    Full Full       Neuro/Psych    Oriented x3: Yes   Mood/Affect: Normal       Dilation    Left eye: 1.0% Mydriacyl, 2.5% Phenylephrine @ 8:13 AM          IMAGING AND PROCEDURES  Imaging and Procedures for 01/05/20  OCT, Retina - OU - Both Eyes       Right Eye Quality was good. Scan locations included subfoveal. Central Foveal Thickness: 320. Progression has worsened.     Left Eye Quality was good. Scan locations included subfoveal. Central Foveal Thickness:  328. Progression has improved.   Notes Diabetic CSME with focal leakages temporal OS and inferior to the fovea OS  CSME OD, temporally       Focal Laser - OS - Left Eye       Time Out Confirmed correct patient, procedure, site, and patient consented.   Anesthesia Topical anesthesia was used. Anesthetic medications included Proparacaine 0.5%.   Laser Information The type of laser was diode. Color was yellow. The duration in seconds was 0.1. The spot size was 100 (100 m with 50 mW power, 0.1-second duration 200 m spot size with 110 mW power, 0.1-second duration) microns. Laser power was 50. Total spots was 57 (Combination).   Post-op The patient tolerated the procedure well. There were no complications. The patient received written and verbal post procedure care education.   Notes Focal applied temporal to the fovea as well as inferior to the fovea                ASSESSMENT/PLAN:  Severe nonproliferative diabetic retinopathy of left eye, with macular edema, associated with type 2 diabetes mellitus (HCC) Noncentral involved CSME OS, leakage temporal and inferior to the fovea documented stabilized, for focal treatment OS today for residual CSME      ICD-10-CM   1. Severe nonproliferative diabetic retinopathy of left eye, with macular edema, associated with type 2 diabetes mellitus (HCC)  E11.3412 OCT, Retina - OU - Both Eyes    Focal Laser - OS - Left Eye    1.  Dilate OD next, consider focal laser treatment temporally  2.  3.  Ophthalmic Meds Ordered this visit:  No orders of the defined types were placed in this encounter.      Return in about 2 weeks (around 01/19/2020) for dilate, OD, OCT, FOCAL.  There are no Patient Instructions on file for this visit.   Explained the diagnoses, plan, and follow up with the patient and they expressed understanding.  Patient  expressed understanding of the importance of proper follow up care.   Alford Highland Trapper Meech M.D. Diseases & Surgery of the Retina and Vitreous [facility] 01/05/20     Abbreviations: M myopia (nearsighted); A astigmatism; H hyperopia (farsighted); P presbyopia; Mrx spectacle prescription;  CTL contact lenses; OD right eye; OS left eye; OU both eyes  XT exotropia; ET esotropia; PEK punctate epithelial keratitis; PEE punctate epithelial erosions; DES dry eye syndrome; MGD meibomian gland dysfunction; ATs artificial tears; PFAT's preservative free artificial tears; NSC nuclear sclerotic cataract; PSC posterior subcapsular cataract; ERM epi-retinal membrane; PVD posterior vitreous detachment; RD retinal detachment; DM diabetes mellitus; DR diabetic retinopathy; NPDR non-proliferative diabetic retinopathy; PDR proliferative diabetic retinopathy; CSME clinically significant macular edema; DME diabetic macular edema; dbh dot blot hemorrhages; CWS cotton wool spot; POAG primary open angle glaucoma; C/D cup-to-disc ratio; HVF humphrey visual field; GVF goldmann visual field; OCT optical coherence tomography; IOP intraocular pressure; BRVO Branch retinal vein occlusion; CRVO central retinal vein occlusion; CRAO central retinal artery occlusion; BRAO branch retinal artery occlusion; RT retinal tear; SB scleral buckle; PPV pars plana vitrectomy; VH Vitreous hemorrhage; PRP panretinal laser photocoagulation; IVK intravitreal kenalog; VMT vitreomacular traction; MH Macular hole;  NVD neovascularization of the disc; NVE neovascularization elsewhere; AREDS age related eye disease study; ARMD age related macular degeneration; POAG primary open angle glaucoma; EBMD epithelial/anterior basement membrane dystrophy; ACIOL anterior chamber intraocular lens; IOL intraocular lens; PCIOL posterior chamber intraocular lens; Phaco/IOL phacoemulsification with intraocular lens placement; PRK photorefractive keratectomy;  LASIK  laser assisted in situ keratomileusis; HTN hypertension; DM diabetes mellitus; COPD chronic obstructive pulmonary disease

## 2020-01-06 ENCOUNTER — Encounter (INDEPENDENT_AMBULATORY_CARE_PROVIDER_SITE_OTHER): Payer: Self-pay | Admitting: Family Medicine

## 2020-01-06 DIAGNOSIS — M6281 Muscle weakness (generalized): Secondary | ICD-10-CM | POA: Diagnosis not present

## 2020-01-06 DIAGNOSIS — M25511 Pain in right shoulder: Secondary | ICD-10-CM | POA: Diagnosis not present

## 2020-01-06 DIAGNOSIS — M25611 Stiffness of right shoulder, not elsewhere classified: Secondary | ICD-10-CM | POA: Diagnosis not present

## 2020-01-06 DIAGNOSIS — Z9889 Other specified postprocedural states: Secondary | ICD-10-CM | POA: Diagnosis not present

## 2020-01-06 NOTE — Progress Notes (Signed)
Chief Complaint:   OBESITY [name] is here to discuss his progress with his obesity treatment plan along with follow-up of his obesity related diagnoses. [name] is on the Category 1 Plan and states he is following his eating plan approximately 80% of the time. [name] states he is exercising 0 minutes 0 times per week.  Today's visit was #: 9 Starting weight: 310 lbs Starting date: 09/01/2019 Today's weight: 257 lbs Today's date: 01/04/2020 Total lbs lost to date: 53 lbs Total lbs lost since last in-office visit: 4 lbs Total weight loss percentage to date: -17.10%  Interim History: [name] reports that he cut back on his ginger snap cookie intake. He reports some hunger but overall pretty well controlled. [name] does not exercise, as was our goa from his last office visit, but he plans to start having some with up coming physical therapy.  Assessment/Plan:   1. Type 2 diabetes mellitus with other specified complication, without long-term current use of insulin (HCC) [name]'s blood sugars are occasionally low, especially at night after dinner.  Plan: [name] will start Ozempic. Discontinue glimepiride (already off insulin). He will continue Metformin and keep a close eye on blood sugar-bring log.   Start- Semaglutide,0.25 or 0.5MG /DOS, (OZEMPIC, 0.25 OR 0.5 MG/DOSE,) 2 MG/1.5ML SOPN; Inject 0.5 mg into the skin once a week.  Dispense: 1.5 mL; Refill: 0  2. Hypertension associated with type 2 diabetes mellitus (HCC) Review: taking medications as instructed, no medication side effects noted, no chest pain on exertion, no dyspnea on exertion, no swelling of ankles.   BP Readings from Last 3 Encounters:  01/04/20 122/75  12/21/19 115/70  12/07/19 (!) 164/78   Plan: We will refill Losartan. [name]'s blood pressure is now at goal.  Refill- losartan (COZAAR) 50 MG tablet; Take 1 tablet (50 mg total) by mouth daily.  Dispense: 30 tablet; Refill: 0  3. At risk for hypoglycemia [name]  was given approximately 10 minutes of counseling today regarding prevention of hypoglycemia.  He was advised of symptoms of hypoglycemia.  [name] was instructed to avoid skipping meals, and to eat regular protein-rich meals as prescribed on the meal plan.  Have readily available- low calorie snacks as needed ie- Welch's fruit snack packs if BS goes too low.   4. Class 2 severe obesity with serious comorbidity and body mass index (BMI) of 35.0 to 35.9 in adult, unspecified obesity type [facility]) [name] is currently in the action stage of change. As such, his goal is to continue with weight loss efforts. He has agreed to change to the Category 2 Plan.   Exercise goals: Starts physical therapy next week.  Behavioral modification strategies: increasing lean protein intake, decreasing simple carbohydrates, decreasing eating out, no skipping meals, holiday eating strategies  and planning for success.  [name] has agreed to follow-up with our clinic in 2 weeks. He was informed of the importance of frequent follow-up visits to maximize his success with intensive lifestyle modifications for his multiple health conditions.   Objective:   Blood pressure 122/75, pulse 80, temperature 98 F (36.7 C), height 5\' 11"  (1.803 m), weight 257 lb (116.6 kg), SpO2 99 %. Body mass index is 35.84 kg/m.  General: Cooperative, alert, well developed, in no acute distress. HEENT: Conjunctivae and lids unremarkable. Cardiovascular: Regular rhythm.  Lungs: Normal work of breathing. Neurologic: No focal deficits.   Lab Results  Component Value Date   CREATININE 1.51 (H) 11/17/2019   BUN 22 (H) 11/17/2019   NA 137 11/17/2019  K 4.4 11/17/2019   CL 101 11/17/2019   CO2 24 11/17/2019   Lab Results  Component Value Date   ALT 22 11/17/2019   AST 21 11/17/2019   ALKPHOS 64 11/17/2019   BILITOT 0.9 11/17/2019   Lab Results  Component Value Date   HGBA1C 5.4 11/17/2019   HGBA1C 6.6 (H) 09/01/2019   HGBA1C 8.8 (H)  03/13/2019   HGBA1C (H) 04/26/2010    11.9 (NOTE)                                                                       According to the ADA Clinical Practice Recommendations for 2011, when HbA1c is used as a screening test:   >=6.5%   Diagnostic of Diabetes Mellitus           (if abnormal result  is confirmed)  5.7-6.4%   Increased risk of developing Diabetes Mellitus  References:Diagnosis and Classification of Diabetes Mellitus,Diabetes Care,2011,34(Suppl 1):S62-S69 and Standards of Medical Care in         Diabetes - 2011,Diabetes Care,2011,34  (Suppl 1):S11-S61.   Lab Results  Component Value Date   INSULIN 28.9 (H) 09/01/2019   Lab Results  Component Value Date   TSH 3.220 09/01/2019   Lab Results  Component Value Date   CHOL 155 09/01/2019   HDL 46 09/01/2019   LDLCALC 89 09/01/2019   TRIG 107 09/01/2019   CHOLHDL 3.4 09/01/2019   Lab Results  Component Value Date   WBC 7.3 11/17/2019   HGB 14.2 11/17/2019   HCT 42.3 11/17/2019   MCV 89.4 11/17/2019   PLT 284 11/17/2019    Attestation Statements:   Reviewed by clinician on day of visit: allergies, medications, problem list, medical history, surgical history, family history, social history, and previous encounter notes.  Edmund Hilda, am acting as Energy manager for [employer], DO.  I have reviewed the above documentation for accuracy and completeness, and I agree with the above. Carlye Grippe, D.O.  The 21st Century Cures Act was signed into law in 2016 which includes the topic of electronic health records.  This provides immediate access to information in MyChart.  This includes consultation notes, operative notes, office notes, lab results and pathology reports.  If you have any questions about what you read please let us know at your next visit so we can discuss your concerns and take corrective action if need be.  We are right here with you.

## 2020-01-06 NOTE — Telephone Encounter (Signed)
Last OV with Dr Opalski 

## 2020-01-06 NOTE — Telephone Encounter (Signed)
Dr Opalski, please advise  

## 2020-01-12 DIAGNOSIS — M25611 Stiffness of right shoulder, not elsewhere classified: Secondary | ICD-10-CM | POA: Diagnosis not present

## 2020-01-12 DIAGNOSIS — Z9889 Other specified postprocedural states: Secondary | ICD-10-CM | POA: Diagnosis not present

## 2020-01-12 DIAGNOSIS — M25511 Pain in right shoulder: Secondary | ICD-10-CM | POA: Diagnosis not present

## 2020-01-12 DIAGNOSIS — M6281 Muscle weakness (generalized): Secondary | ICD-10-CM | POA: Diagnosis not present

## 2020-01-15 DIAGNOSIS — Z9889 Other specified postprocedural states: Secondary | ICD-10-CM | POA: Diagnosis not present

## 2020-01-15 DIAGNOSIS — M6281 Muscle weakness (generalized): Secondary | ICD-10-CM | POA: Diagnosis not present

## 2020-01-15 DIAGNOSIS — M25511 Pain in right shoulder: Secondary | ICD-10-CM | POA: Diagnosis not present

## 2020-01-15 DIAGNOSIS — M25611 Stiffness of right shoulder, not elsewhere classified: Secondary | ICD-10-CM | POA: Diagnosis not present

## 2020-01-18 ENCOUNTER — Ambulatory Visit (INDEPENDENT_AMBULATORY_CARE_PROVIDER_SITE_OTHER): Payer: BC Managed Care – PPO | Admitting: Family Medicine

## 2020-01-18 ENCOUNTER — Encounter (INDEPENDENT_AMBULATORY_CARE_PROVIDER_SITE_OTHER): Payer: Self-pay | Admitting: Family Medicine

## 2020-01-18 ENCOUNTER — Other Ambulatory Visit: Payer: Self-pay

## 2020-01-18 VITALS — BP 117/76 | HR 85 | Temp 97.9°F | Ht 71.0 in | Wt 251.0 lb

## 2020-01-18 DIAGNOSIS — M25611 Stiffness of right shoulder, not elsewhere classified: Secondary | ICD-10-CM | POA: Diagnosis not present

## 2020-01-18 DIAGNOSIS — E1169 Type 2 diabetes mellitus with other specified complication: Secondary | ICD-10-CM | POA: Diagnosis not present

## 2020-01-18 DIAGNOSIS — Z6835 Body mass index (BMI) 35.0-35.9, adult: Secondary | ICD-10-CM | POA: Diagnosis not present

## 2020-01-18 DIAGNOSIS — Z9889 Other specified postprocedural states: Secondary | ICD-10-CM | POA: Diagnosis not present

## 2020-01-18 DIAGNOSIS — Z9189 Other specified personal risk factors, not elsewhere classified: Secondary | ICD-10-CM

## 2020-01-18 DIAGNOSIS — M25511 Pain in right shoulder: Secondary | ICD-10-CM | POA: Diagnosis not present

## 2020-01-18 DIAGNOSIS — E559 Vitamin D deficiency, unspecified: Secondary | ICD-10-CM | POA: Diagnosis not present

## 2020-01-18 DIAGNOSIS — M6281 Muscle weakness (generalized): Secondary | ICD-10-CM | POA: Diagnosis not present

## 2020-01-18 MED ORDER — VITAMIN D (ERGOCALCIFEROL) 1.25 MG (50000 UNIT) PO CAPS: 50000.0000 [IU] | ORAL_CAPSULE | ORAL | 0 refills | Status: DC

## 2020-01-18 MED ORDER — GLIMEPIRIDE 1 MG PO TABS
1.0000 mg | ORAL_TABLET | Freq: Two times a day (BID) | ORAL | 0 refills | Status: DC
Start: 2020-01-18 — End: 2020-04-11

## 2020-01-19 ENCOUNTER — Ambulatory Visit (INDEPENDENT_AMBULATORY_CARE_PROVIDER_SITE_OTHER): Payer: BC Managed Care – PPO | Admitting: Ophthalmology

## 2020-01-19 ENCOUNTER — Encounter (INDEPENDENT_AMBULATORY_CARE_PROVIDER_SITE_OTHER): Payer: Self-pay | Admitting: Ophthalmology

## 2020-01-19 DIAGNOSIS — E113411 Type 2 diabetes mellitus with severe nonproliferative diabetic retinopathy with macular edema, right eye: Secondary | ICD-10-CM | POA: Diagnosis not present

## 2020-01-19 DIAGNOSIS — G4733 Obstructive sleep apnea (adult) (pediatric): Secondary | ICD-10-CM

## 2020-01-19 NOTE — Progress Notes (Signed)
Chief Complaint:   OBESITY [name] is here to discuss his progress with his obesity treatment plan along with follow-up of his obesity related diagnoses. [name] is on the Category 2 Plan and states he is following his eating plan approximately 75% of the time. [name] states he is doing P.T. 45 minutes 2 times per week.  Today's visit was #: 10 Starting weight: 310 lbs Starting date: 7/202/2021 Today's weight: 251 lbs Today's date: 01/18/2020 Total lbs lost to date: 59 lbs Total lbs lost since last in-office visit: 6 lbs Total weight loss percentage to date: -19.03%  Interim History: At [name]'s last OV we increased from Category 1 to Category 2, but he is mainly following Category 1 and doesn't feel hungry.   He thinks Category 2 is too much food.  He wants to change back.   Assessment/Plan:   Meds ordered this encounter  Medications  . Vitamin D, Ergocalciferol, (DRISDOL) 1.25 MG (50000 UNIT) CAPS capsule    Sig: Take 1 capsule (50,000 Units total) by mouth every 7 (seven) days.    Dispense:  4 capsule    Refill:  0  . glimepiride (AMARYL) 1 MG tablet    Sig: Take 1 tablet (1 mg total) by mouth in the morning and at bedtime.    Dispense:  180 tablet    Refill:  0    1. Type 2 diabetes mellitus with other specified complication, without long-term current use of insulin (HCC) We started [name] on Ozempic and discontinued glimepiride last OV or two OV's ago. He is still on Metformin. [name] doesn't like that his blood sugars are running 160's-170's in the mornings but has no lows like prior. He is tolerating medication well.  Plan:  1. Start back on Glimepiride BID    2. Continue Ozempic 0.5 mg weekly 3. Continue Metformin as prescribed  Lab Results  Component Value Date   HGBA1C 5.4 11/17/2019   HGBA1C 6.6 (H) 09/01/2019   HGBA1C 8.8 (H) 03/13/2019   Lab Results  Component Value Date   INSULIN 28.9 (H) 09/01/2019   Lab Results  Component Value Date   LDLCALC 89  09/01/2019   CREATININE 1.51 (H) 11/17/2019   - Again Counseled patient on pathophysiology of disease and discussed good blood sugar control is important to decrease the likelihood of diabetic complications such as nephropathy, neuropathy, limb loss, blindness, coronary artery disease, and death.  Intensive lifestyle modification including diet, exercise and weight loss are the first line of treatment for diabetes.    - Continue home blood sugar monitoring regularly - closely- especially with continued wt loss.  Reviewed blood sugar goals.  - Reminded if pt feels poorly- check BS and BP at that time.  Bring in BS and BP logs each OV. - Eat on a regular basis- no skipping or going long periods without eating.  We discussed hypoglycemia prevention. - Any concerns about medicines should be directed at the prescribing provider - Recheck labs in 3 months if not done at Endo/ PCP.  - Importance of f/up with PCP and all other specialists as scheduled was stressed to pt today   2. Vitamin D deficiency [name] Vitamin D level was 31.7 on 09/01/2019. He is currently taking prescription vitamin D 50,000 IU each week. He denies nausea, vomiting or muscle weakness.  Plan:  Refill Vit D for 1 month, as per below. Low Vitamin D level contributes to fatigue and are associated with obesity, breast, and colon cancer. He  agrees to continue to take prescription Vitamin D @50 ,000 IU every week and will follow-up for routine testing of Vitamin D, at least 2-3 times per year to avoid over-replacement.  Refill- Vitamin D, Ergocalciferol, (DRISDOL) 1.25 MG (50000 UNIT) CAPS capsule; Take 1 capsule (50,000 Units total) by mouth every 7 (seven) days.  Dispense: 4 capsule; Refill: 0    3. At risk for hypoglycemia [name] was given approximately 15 minutes of counseling today regarding prevention of hypoglycemia. He was advised of symptoms of hypoglycemia. [name] was instructed to avoid skipping meals, and to eat regular  protein-rich meals as prescribed on the meal plan. Have readily available- low calorie snacks as needed ie- Welch's fruit snack packs if BS goes too low.     4. Class 2 severe obesity with serious comorbidity and body mass index (BMI) of 35.0 to 35.9 in adult, unspecified obesity type [name]) [name] is currently in the action stage of change. As such, his goal is to continue with weight loss efforts. He has agreed to change to the Category 1 Plan with 100 extra calories of high protein (10:1 ratio).   Exercise goals: As is  Behavioral modification strategies: increasing lean protein intake, decreasing simple carbohydrates, decreasing eating out, no skipping meals, holiday eating strategies  and planning for success.  [name] has agreed to follow-up with our clinic in 2 weeks. He was informed of the importance of frequent follow-up visits to maximize his success with intensive lifestyle modifications for his multiple health conditions.   Objective:   Blood pressure 117/76, pulse 85, temperature 97.9 F (36.6 C), height 5\' 11"  (1.803 m), weight 251 lb (113.9 kg), SpO2 98 %. Body mass index is 35.01 kg/m.  General: Cooperative, alert, well developed, in no acute distress. HEENT: Conjunctivae and lids unremarkable. Cardiovascular: Regular rhythm.  Lungs: Normal work of breathing. Neurologic: No focal deficits.   Molly Maduroa1  Lab Results  Component Value Date   CREATININE 1.51 (H) 11/17/2019   BUN 22 (H) 11/17/2019   NA 137 11/17/2019   K 4.4 11/17/2019   CL 101 11/17/2019   CO2 24 11/17/2019   Lab Results  Component Value Date   ALT 22 11/17/2019   AST 21 11/17/2019   ALKPHOS 64 11/17/2019   BILITOT 0.9 11/17/2019   Lab Results  Component Value Date   HGBA1C 5.4 11/17/2019   HGBA1C 6.6 (H) 09/01/2019   HGBA1C 8.8 (H) 03/13/2019   HGBA1C (H) 04/26/2010    11.9 (NOTE)                                                                       According to the ADA Clinical Practice  Recommendations for 2011, when HbA1c is used as a screening test:   >=6.5%   Diagnostic of Diabetes Mellitus           (if abnormal result  is confirmed)  5.7-6.4%   Increased risk of developing Diabetes Mellitus  References:Diagnosis and Classification of Diabetes Mellitus,Diabetes Care,2011,34(Suppl 1):S62-S69 and Standards of Medical Care in         Diabetes - 2011,Diabetes Care,2011,34  (Suppl 1):S11-S61.   Lab Results  Component Value Date   INSULIN 28.9 (H) 09/01/2019   Lab Results  Component Value Date  TSH 3.220 09/01/2019   Lab Results  Component Value Date   CHOL 155 09/01/2019   HDL 46 09/01/2019   LDLCALC 89 09/01/2019   TRIG 107 09/01/2019   CHOLHDL 3.4 09/01/2019   Lab Results  Component Value Date   WBC 7.3 11/17/2019   HGB 14.2 11/17/2019   HCT 42.3 11/17/2019   MCV 89.4 11/17/2019   PLT 284 11/17/2019    Attestation Statements:   Reviewed by clinician on day of visit: allergies, medications, problem list, medical history, surgical history, family history, social history, and previous encounter notes.  Edmund Hilda, am acting as Energy manager for [employer], DO.  I have reviewed the above documentation for accuracy and completeness, and I agree with the above. Carlye Grippe, D.O.  The 21st Century Cures Act was signed into law in 2016 which includes the topic of electronic health records.  This provides immediate access to information in MyChart.  This includes consultation notes, operative notes, office notes, lab results and pathology reports.  If you have any questions about what you read please let us know at your next visit so we can discuss your concerns and take corrective action if need be.  We are right here with you.

## 2020-01-19 NOTE — Assessment & Plan Note (Signed)
I discussed with patient at length showing him cases of improved macular/retinal conditions on people who have been using sleep apnea therapy, CPAP.  I explained the importance of preventing nighttime hypoxia on diabetic eye disease progression and/or worsening.  He understands in the future that even borderline sleep apnea should be treated in his case given the underlying diabetic eye disease

## 2020-01-19 NOTE — Progress Notes (Signed)
01/19/2020     CHIEF COMPLAINT Patient presents for Retina Follow Up   HISTORY OF PRESENT ILLNESS: [name] is a 59 y.o. male who presents to the clinic today for:   HPI    Retina Follow Up    Patient presents with  Diabetic Retinopathy.  In right eye.  This started 2 weeks ago.  Severity is mild.  Duration of 2 weeks.  Since onset it is stable.          Comments    2 Week Focal OD  Pt denies noticeable changes to [place] OU since last visit. Pt denies ocular pain, flashes of light, or floaters OU.  LBS: 160 this AM       Last edited by Ileana RoupKennerly, Paige, COA on 01/19/2020  8:24 AM. (History)      Referring physician: Marden NobleGates, Myrl, MD [address] [place] [id number]  HISTORICAL INFORMATION:   Selected notes from the MEDICAL RECORD NUMBER    Lab Results  Component Value Date   HGBA1C 5.4 11/17/2019     CURRENT MEDICATIONS: No current outpatient medications on file. (Ophthalmic Drugs)   No current facility-administered medications for this visit. (Ophthalmic Drugs)   Current Outpatient Medications (Other)  Medication Sig  . albuterol (VENTOLIN HFA) 108 (90 Base) MCG/ACT inhaler Inhale 2 puffs into the lungs every 6 (six) hours as needed for wheezing or shortness of breath.  Marland Kitchen. amLODipine (NORVASC) 5 MG tablet Take 5 mg by mouth daily.  Marland Kitchen. aspirin EC 81 MG tablet Take 81 mg by mouth daily.  Marland Kitchen. atorvastatin (LIPITOR) 10 MG tablet Take 1 tablet (10 mg total) by mouth daily.  . cetirizine (ZYRTEC) 10 MG tablet Take 10 mg by mouth daily.  . Cholecalciferol (VITAMIN D) 50 MCG (2000 UT) tablet Take 4,000 Units by mouth daily.   . cloNIDine (CATAPRES) 0.2 MG tablet Take 0.2 mg by mouth every evening.   Marland Kitchen. glimepiride (AMARYL) 1 MG tablet Take 1 tablet (1 mg total) by mouth in the morning and at bedtime.  Marland Kitchen. loratadine (CLARITIN) 10 MG tablet Take 10 mg by mouth daily.   Marland Kitchen. losartan (COZAAR) 50 MG tablet Take 1 tablet (50 mg total) by mouth daily.  .  Magnesium 400 MG CAPS Take 400 mg by mouth daily.   . metFORMIN (GLUCOPHAGE-XR) 500 MG 24 hr tablet Take 1,000 mg by mouth 2 (two) times daily.   . pantoprazole (PROTONIX) 40 MG tablet TAKE 1 TABLET BY MOUTH TWICE DAILY (Patient taking differently: Take 40 mg by mouth 2 (two) times daily. )  . Semaglutide,0.25 or 0.5MG /DOS, (OZEMPIC, 0.25 OR 0.5 MG/DOSE,) 2 MG/1.5ML SOPN Inject 0.5 mg into the skin once a week.  Marland Kitchen. Spacer/Aero-Holding Chambers (AEROCHAMBER MV) inhaler Use as instructed (Patient taking differently: as needed. Use as instructed for cough)  . vitamin B-12 (CYANOCOBALAMIN) 500 MCG tablet Take 500 mcg by mouth daily.   . Vitamin D, Ergocalciferol, (DRISDOL) 1.25 MG (50000 UNIT) CAPS capsule Take 1 capsule (50,000 Units total) by mouth every 7 (seven) days.   No current facility-administered medications for this visit. (Other)      REVIEW OF SYSTEMS:    ALLERGIES No Known Allergies  PAST MEDICAL HISTORY Past Medical History:  Diagnosis Date  . ADD (attention deficit disorder)   . Anginal pain (HCC) 2012  . Arthritis, shoulder region   . B12 deficiency   . Back pain   . Coronary artery disease   . Diabetes (  HCC)   . Diabetic retinopathy (HCC)   . GERD (gastroesophageal reflux disease)   . H/O heart artery stent 04/2010  . Hyperlipidemia   . Hypertension   . Joint pain   . Knee pain   . Neuropathy   . NSTEMI (non-ST elevated myocardial infarction) (HCC)   . Obesity   . Osteoarthritis   . Persistent cough    stopped for a year  . Sleep apnea   . Swallowing difficulty   . Swelling of both lower extremities    Past Surgical History:  Procedure Laterality Date  . ACHILLES TENDON REPAIR Right   . AMPUTATION TOE Right 03/14/2019   Procedure: AMPUTATION TOE, 5th toe;  Surgeon: [name], DPM;  Location: [facility] OR;  Service: Podiatry;  Laterality: Right;  . heart stent  2012   x1  . INCISION AND DRAINAGE Right 03/14/2019   Procedure: INCISION AND DRAINAGE;   Surgeon: [name], DPM;  Location: [facility] OR;  Service: Podiatry;  Laterality: Right;  . SHOULDER ARTHROSCOPY Right   . TEE WITHOUT CARDIOVERSION N/A 03/17/2019   Procedure: TRANSESOPHAGEAL ECHOCARDIOGRAM (TEE);  Surgeon: Chilton Si, MD;  [facility];  Service: Cardiovascular;  Laterality: N/A;  . TOE SURGERY Right 06/2018   for a hammer toe  . TOTAL SHOULDER ARTHROPLASTY Right 11/19/2019   Procedure: TOTAL SHOULDER ARTHROPLASTY;  Surgeon: [name], MD;  Location: [facility] ORS;  Service: Orthopedics;  Laterality: Right;  . UVULOPALATOPHARYNGOPLASTY      FAMILY HISTORY Family History  Problem Relation Age of Onset  . Cancer Other   . Diabetes Other   . Heart disease Other   . Breast cancer Mother   . Cancer Mother   . Heart disease Father   . Diabetes Father   . Stroke Father   . Leukemia Brother   . Asthma Maternal Uncle     SOCIAL HISTORY Social History   Tobacco Use  . Smoking status: Passive Smoke Exposure - Never Smoker  . Smokeless tobacco: Never Used  . Tobacco comment: Both parents growing up.   Vaping Use  . Vaping Use: Never used  Substance Use Topics  . Alcohol use: Yes    Comment: 4-5 on weekends  . Drug use: No         OPHTHALMIC EXAM:  Base Eye Exam    Visual Acuity (ETDRS)      Right Left   Dist cc 20/20 20/20   Correction: Glasses       Tonometry (Tonopen, 8:24 AM)      Right Left   Pressure 15 13       Pupils      Pupils Dark Light Shape React APD   Right PERRL 4 3 Round Brisk None   Left PERRL 4 3 Round Brisk None       Visual Fields (Counting fingers)      Left Right    Full Full       Extraocular Movement      Right Left    Full Full       Neuro/Psych    Oriented x3: Yes   Mood/Affect: Normal       Dilation    Right eye: 1.0% Mydriacyl, 2.5% Phenylephrine @ 8:26 AM          IMAGING AND PROCEDURES  Imaging and Procedures for 01/19/20  OCT, Retina - OU - Both Eyes       Right Eye Quality  was good. Scan locations included subfoveal. Central  Foveal Thickness: 320. Progression has worsened.   Left Eye Quality was good. Scan locations included subfoveal. Central Foveal Thickness: 323. Progression has improved.   Notes Diabetic CSME with focal leakages temporal OS and inferior to the fovea OS  CSME OD, temporally       Focal Laser - OD - Right Eye       Time Out Confirmed correct patient, procedure, site, and patient consented.   Anesthesia Topical anesthesia was used. Anesthetic medications included Proparacaine 0.5%.   Laser Information The type of laser was diode. Color was yellow. The duration in seconds was 0.1. The spot size was 390 microns. Laser power was 50. Total spots was 64.   Post-op The patient tolerated the procedure well. There were no complications. The patient received written and verbal post procedure care education.                 ASSESSMENT/PLAN:  Obstructive sleep apnea I discussed with patient at length showing him cases of improved macular/retinal conditions on people who have been using sleep apnea therapy, CPAP.  I explained the importance of preventing nighttime hypoxia on diabetic eye disease progression and/or worsening.  He understands in the future that even borderline sleep apnea should be treated in his case given the underlying diabetic eye disease      ICD-10-CM   1. Severe nonproliferative diabetic retinopathy of right eye, with macular edema, associated with type 2 diabetes mellitus (HCC)  E11.3411 OCT, Retina - OU - Both Eyes    Focal Laser - OD - Right Eye  2. Obstructive sleep apnea  G47.33     1.  2.  3.  Ophthalmic Meds Ordered this visit:  No orders of the defined types were placed in this encounter.      Return in about 4 months (around 05/19/2020) for DILATE OU, COLOR FP, OCT.  There are no Patient Instructions on file for this visit.   Explained the diagnoses, plan, and follow up with the  patient and they expressed understanding.  Patient expressed understanding of the importance of proper follow up care.   Alford Highland Christelle Igoe M.D. Diseases & Surgery of the Retina and Vitreous [facility] 01/19/20     Abbreviations: M myopia (nearsighted); A astigmatism; H hyperopia (farsighted); P presbyopia; Mrx spectacle prescription;  CTL contact lenses; OD right eye; OS left eye; OU both eyes  XT exotropia; ET esotropia; PEK punctate epithelial keratitis; PEE punctate epithelial erosions; DES dry eye syndrome; MGD meibomian gland dysfunction; ATs artificial tears; PFAT's preservative free artificial tears; NSC nuclear sclerotic cataract; PSC posterior subcapsular cataract; ERM epi-retinal membrane; PVD posterior vitreous detachment; RD retinal detachment; DM diabetes mellitus; DR diabetic retinopathy; NPDR non-proliferative diabetic retinopathy; PDR proliferative diabetic retinopathy; CSME clinically significant macular edema; DME diabetic macular edema; dbh dot blot hemorrhages; CWS cotton wool spot; POAG primary open angle glaucoma; C/D cup-to-disc ratio; HVF humphrey visual field; GVF goldmann visual field; OCT optical coherence tomography; IOP intraocular pressure; BRVO Branch retinal vein occlusion; CRVO central retinal vein occlusion; CRAO central retinal artery occlusion; BRAO branch retinal artery occlusion; RT retinal tear; SB scleral buckle; PPV pars plana vitrectomy; VH Vitreous hemorrhage; PRP panretinal laser photocoagulation; IVK intravitreal kenalog; VMT vitreomacular traction; MH Macular hole;  NVD neovascularization of the disc; NVE neovascularization elsewhere; AREDS age related eye disease study; ARMD age related macular degeneration; POAG primary open angle glaucoma; EBMD epithelial/anterior basement membrane dystrophy; ACIOL anterior chamber intraocular lens; IOL intraocular lens; PCIOL posterior chamber  intraocular lens; Phaco/IOL phacoemulsification with intraocular  lens placement; PRK photorefractive keratectomy; LASIK laser assisted in situ keratomileusis; HTN hypertension; DM diabetes mellitus; COPD chronic obstructive pulmonary disease

## 2020-01-20 ENCOUNTER — Ambulatory Visit: Payer: BC Managed Care – PPO | Admitting: Sports Medicine

## 2020-01-20 ENCOUNTER — Other Ambulatory Visit: Payer: BC Managed Care – PPO | Admitting: Orthotics

## 2020-01-21 DIAGNOSIS — Z9889 Other specified postprocedural states: Secondary | ICD-10-CM | POA: Diagnosis not present

## 2020-01-21 DIAGNOSIS — M25611 Stiffness of right shoulder, not elsewhere classified: Secondary | ICD-10-CM | POA: Diagnosis not present

## 2020-01-21 DIAGNOSIS — M6281 Muscle weakness (generalized): Secondary | ICD-10-CM | POA: Diagnosis not present

## 2020-01-21 DIAGNOSIS — M25511 Pain in right shoulder: Secondary | ICD-10-CM | POA: Diagnosis not present

## 2020-01-22 ENCOUNTER — Ambulatory Visit: Payer: BC Managed Care – PPO | Admitting: Orthotics

## 2020-01-22 ENCOUNTER — Other Ambulatory Visit: Payer: Self-pay

## 2020-01-22 DIAGNOSIS — S90822D Blister (nonthermal), left foot, subsequent encounter: Secondary | ICD-10-CM

## 2020-01-22 DIAGNOSIS — M25372 Other instability, left ankle: Secondary | ICD-10-CM

## 2020-01-22 NOTE — Progress Notes (Signed)
Picked up modified brace (offfload pressure area).

## 2020-01-25 DIAGNOSIS — M6281 Muscle weakness (generalized): Secondary | ICD-10-CM | POA: Diagnosis not present

## 2020-01-25 DIAGNOSIS — M25511 Pain in right shoulder: Secondary | ICD-10-CM | POA: Diagnosis not present

## 2020-01-25 DIAGNOSIS — M25611 Stiffness of right shoulder, not elsewhere classified: Secondary | ICD-10-CM | POA: Diagnosis not present

## 2020-01-25 DIAGNOSIS — Z9889 Other specified postprocedural states: Secondary | ICD-10-CM | POA: Diagnosis not present

## 2020-01-28 ENCOUNTER — Telehealth: Payer: Self-pay | Admitting: Internal Medicine

## 2020-01-28 DIAGNOSIS — M25511 Pain in right shoulder: Secondary | ICD-10-CM | POA: Diagnosis not present

## 2020-01-28 DIAGNOSIS — M25611 Stiffness of right shoulder, not elsewhere classified: Secondary | ICD-10-CM | POA: Diagnosis not present

## 2020-01-28 DIAGNOSIS — M6281 Muscle weakness (generalized): Secondary | ICD-10-CM | POA: Diagnosis not present

## 2020-01-28 DIAGNOSIS — Z9889 Other specified postprocedural states: Secondary | ICD-10-CM | POA: Diagnosis not present

## 2020-01-28 NOTE — Telephone Encounter (Signed)
Pts lab appt was rescheduled to 02/01/20 as requested by the pt.  Pt verbalized understanding and agrees with this plan.

## 2020-01-28 NOTE — Telephone Encounter (Signed)
Patient wanted to know if he could move up his labs to [facility] 02/01/20. Pt does not live close to [place] and has to come Monday for another Doctor's appointment. Lab schedule was full

## 2020-02-01 ENCOUNTER — Other Ambulatory Visit: Payer: Self-pay

## 2020-02-01 ENCOUNTER — Ambulatory Visit (INDEPENDENT_AMBULATORY_CARE_PROVIDER_SITE_OTHER): Payer: BC Managed Care – PPO | Admitting: Family Medicine

## 2020-02-01 ENCOUNTER — Encounter (INDEPENDENT_AMBULATORY_CARE_PROVIDER_SITE_OTHER): Payer: Self-pay | Admitting: Family Medicine

## 2020-02-01 ENCOUNTER — Other Ambulatory Visit: Payer: BC Managed Care – PPO | Admitting: *Deleted

## 2020-02-01 VITALS — BP 123/82 | HR 79 | Temp 97.5°F | Ht 71.0 in | Wt 246.0 lb

## 2020-02-01 DIAGNOSIS — M25611 Stiffness of right shoulder, not elsewhere classified: Secondary | ICD-10-CM | POA: Diagnosis not present

## 2020-02-01 DIAGNOSIS — Z9189 Other specified personal risk factors, not elsewhere classified: Secondary | ICD-10-CM

## 2020-02-01 DIAGNOSIS — I2510 Atherosclerotic heart disease of native coronary artery without angina pectoris: Secondary | ICD-10-CM

## 2020-02-01 DIAGNOSIS — E1159 Type 2 diabetes mellitus with other circulatory complications: Secondary | ICD-10-CM

## 2020-02-01 DIAGNOSIS — Z6834 Body mass index (BMI) 34.0-34.9, adult: Secondary | ICD-10-CM

## 2020-02-01 DIAGNOSIS — E1169 Type 2 diabetes mellitus with other specified complication: Secondary | ICD-10-CM

## 2020-02-01 DIAGNOSIS — E782 Mixed hyperlipidemia: Secondary | ICD-10-CM

## 2020-02-01 DIAGNOSIS — M25511 Pain in right shoulder: Secondary | ICD-10-CM | POA: Diagnosis not present

## 2020-02-01 DIAGNOSIS — Z9889 Other specified postprocedural states: Secondary | ICD-10-CM | POA: Diagnosis not present

## 2020-02-01 DIAGNOSIS — I152 Hypertension secondary to endocrine disorders: Secondary | ICD-10-CM

## 2020-02-01 DIAGNOSIS — M6281 Muscle weakness (generalized): Secondary | ICD-10-CM | POA: Diagnosis not present

## 2020-02-01 DIAGNOSIS — E669 Obesity, unspecified: Secondary | ICD-10-CM

## 2020-02-01 LAB — HEPATIC FUNCTION PANEL
ALT: 25 IU/L (ref 0–44)
AST: 27 IU/L (ref 0–40)
Albumin: 4.5 g/dL (ref 3.8–4.9)
Alkaline Phosphatase: 91 IU/L (ref 44–121)
Bilirubin Total: 1.1 mg/dL (ref 0.0–1.2)
Bilirubin, Direct: 0.26 mg/dL (ref 0.00–0.40)
Total Protein: 6.8 g/dL (ref 6.0–8.5)

## 2020-02-01 LAB — LIPID PANEL
Chol/HDL Ratio: 2.9 ratio (ref 0.0–5.0)
Cholesterol, Total: 132 mg/dL (ref 100–199)
HDL: 46 mg/dL (ref >39)
LDL Chol Calc (NIH): 69 mg/dL (ref 0–99)
Triglycerides: 92 mg/dL (ref 0–149)
VLDL Cholesterol Cal: 17 mg/dL (ref 5–40)

## 2020-02-01 MED ORDER — OZEMPIC (0.25 OR 0.5 MG/DOSE) 2 MG/1.5ML ~~LOC~~ SOPN: 0.5000 mg | PEN_INJECTOR | SUBCUTANEOUS | 0 refills | Status: DC

## 2020-02-01 MED ORDER — LOSARTAN POTASSIUM 50 MG PO TABS: 50.0000 mg | ORAL_TABLET | Freq: Every day | ORAL | 0 refills | Status: DC

## 2020-02-01 NOTE — Progress Notes (Signed)
Chief Complaint:   OBESITY [name] is here to discuss his progress with his obesity treatment plan along with follow-up of his obesity related diagnoses. [name] is on the Category 1 Plan + 100 calories of high protein 10:1 ratio and states he is following his eating plan approximately 70% of the time. [name] states he is physical therapy 45 minutes 2 times per week.  Today's visit was #: 11 Starting weight: 310 lbs Starting date: 09/01/2019 Today's weight: 246 lbs Today's date: 02/01/2020 Total lbs lost to date: 64 lbs Total lbs lost since last in-office visit: 5 lbs Total weight loss percentage to date: -20.65%  Interim History: [name] reports that he made better choices over the past 2 weeks but wasn't perfect. He is surprised that he lost weight, but he has been more active with shopping and physical therapy. [name] denies concerns today. He loves what we've done with is diabetes medication. His blood sugars are stable and he feels great!   Plan: [name] wants to join the gym in [place] that's coming 2022! He's advised to look into free exercise groups online, such as "[employer]" on [address].   Assessment/Plan:   1. Type 2 diabetes mellitus with other specified complication, without long-term current use of insulin (HCC) [name]'s blood sugars are leveling out perfectly. He reports fasting blood sugars 101-126. He is not checking 2 hours post-prandial.  Medications reviewed. Diabetic ROS: no polyuria or polydipsia, no chest pain, dyspnea or TIA's, no numbness, tingling or pain in extremities.   Lab Results  Component Value Date   HGBA1C 5.4 11/17/2019   HGBA1C 6.6 (H) 09/01/2019   HGBA1C 8.8 (H) 03/13/2019   Lab Results  Component Value Date   LDLCALC 69 02/01/2020   CREATININE 1.51 (H) 11/17/2019   Lab Results  Component Value Date   INSULIN 28.9 (H) 09/01/2019   Plan: Refill Ozempic for 1 month, as per below. Good blood sugar control is important to decrease  the likelihood of diabetic complications such as nephropathy, neuropathy, limb loss, blindness, coronary artery disease, and death. Intensive lifestyle modification including diet, exercise and weight loss are the first line of treatment for diabetes.   Refill- Semaglutide,0.25 or 0.5MG /DOS, (OZEMPIC, 0.25 OR 0.5 MG/DOSE,) 2 MG/1.5ML SOPN; Inject 0.5 mg into the skin once a week.  Dispense: 1.5 mL; Refill: 0  2. Hypertension associated with type 2 diabetes mellitus (HCC) [name] reports his blood pressure is within normal limits at home. He denies dizziness or symptoms of concern. Review: taking medications as instructed, no medication side effects noted, no chest pain on exertion, no dyspnea on exertion, no swelling of ankles.   BP Readings from Last 3 Encounters:  02/01/20 123/82  01/18/20 117/76  01/04/20 122/75   Plan: Refill Cozaar for 1 month, as per below. [name] is working on healthy weight loss and exercise to improve blood pressure control. We will watch for signs of hypotension as he continues his lifestyle modifications.  Refill- losartan (COZAAR) 50 MG tablet; Take 1 tablet (50 mg total) by mouth daily.  Dispense: 90 tablet; Refill: 0  3. At risk for hypoglycemia [name] was given approximately 15 minutes of counseling today regarding prevention of hypoglycemia. He was advised of symptoms of hypoglycemia.  [name] was instructed to avoid skipping meals, and to eat regular protein-rich meals as prescribed on the meal plan. Have readily available- low calorie snacks as needed ie- [employer] fruit snack packs if BS goes too low.   4. Class 1  obesity with serious comorbidity and body mass index (BMI) of 34.0 to 34.9 in adult, unspecified obesity type [name] is currently in the action stage of change. As such, his goal is to continue with weight loss efforts. He has agreed to the Category 1 Plan + 100 calories of high protein 10:1 ratio.   Exercise goals: For substantial health benefits,  adults should do at least 150 minutes (2 hours and 30 minutes) a week of moderate-intensity, or 75 minutes (1 hour and 15 minutes) a week of vigorous-intensity aerobic physical activity, or an equivalent combination of moderate- and vigorous-intensity aerobic activity. Aerobic activity should be performed in episodes of at least 10 minutes, and preferably, it should be spread throughout the week.  Behavioral modification strategies: decreasing liquid calories, meal planning and cooking strategies, holiday eating strategies , celebration eating strategies and planning for success.  [name] has agreed to follow-up with our clinic in 2 weeks. He was informed of the importance of frequent follow-up visits to maximize his success with intensive lifestyle modifications for his multiple health conditions.   Objective:   Blood pressure 123/82, pulse 79, temperature (!) 97.5 F (36.4 C), height 5\' 11"  (1.803 m), weight 246 lb (111.6 kg), SpO2 99 %. Body mass index is 34.31 kg/m.  General: Cooperative, alert, well developed, in no acute distress. HEENT: Conjunctivae and lids unremarkable. Cardiovascular: Regular rhythm.  Lungs: Normal work of breathing. Neurologic: No focal deficits.   Lab Results  Component Value Date   CREATININE 1.51 (H) 11/17/2019   BUN 22 (H) 11/17/2019   NA 137 11/17/2019   K 4.4 11/17/2019   CL 101 11/17/2019   CO2 24 11/17/2019   Lab Results  Component Value Date   ALT 25 02/01/2020   AST 27 02/01/2020   ALKPHOS 91 02/01/2020   BILITOT 1.1 02/01/2020   Lab Results  Component Value Date   HGBA1C 5.4 11/17/2019   HGBA1C 6.6 (H) 09/01/2019   HGBA1C 8.8 (H) 03/13/2019   HGBA1C (H) 04/26/2010    11.9 (NOTE)                                                                       According to the ADA Clinical Practice Recommendations for 2011, when HbA1c is used as a screening test:   >=6.5%   Diagnostic of Diabetes Mellitus           (if abnormal result  is confirmed)   5.7-6.4%   Increased risk of developing Diabetes Mellitus  References:Diagnosis and Classification of Diabetes Mellitus,Diabetes Care,2011,34(Suppl 1):S62-S69 and Standards of Medical Care in         Diabetes - 2011,Diabetes Care,2011,34  (Suppl 1):S11-S61.   Lab Results  Component Value Date   INSULIN 28.9 (H) 09/01/2019   Lab Results  Component Value Date   TSH 3.220 09/01/2019   Lab Results  Component Value Date   CHOL 132 02/01/2020   HDL 46 02/01/2020   LDLCALC 69 02/01/2020   TRIG 92 02/01/2020   CHOLHDL 2.9 02/01/2020   Lab Results  Component Value Date   WBC 7.3 11/17/2019   HGB 14.2 11/17/2019   HCT 42.3 11/17/2019   MCV 89.4 11/17/2019   PLT 284 11/17/2019    Attestation Statements:  Reviewed by clinician on day of visit: allergies, medications, problem list, medical history, surgical history, family history, social history, and previous encounter notes.  Coral Ceo, am acting as Location manager for [employer], DO.  I have reviewed the above documentation for accuracy and completeness, and I agree with the above. Marjory Sneddon, D.O.  The [place] was signed into law in 2016 which includes the topic of electronic health records.  This provides immediate access to information in MyChart.  This includes consultation notes, operative notes, office notes, lab results and pathology reports.  If you have any questions about what you read please let us know at your next visit so we can discuss your concerns and take corrective action if need be.  We are right here with you.

## 2020-02-02 ENCOUNTER — Other Ambulatory Visit: Payer: BC Managed Care – PPO

## 2020-02-03 DIAGNOSIS — Z9889 Other specified postprocedural states: Secondary | ICD-10-CM | POA: Diagnosis not present

## 2020-02-03 DIAGNOSIS — M25611 Stiffness of right shoulder, not elsewhere classified: Secondary | ICD-10-CM | POA: Diagnosis not present

## 2020-02-03 DIAGNOSIS — M25511 Pain in right shoulder: Secondary | ICD-10-CM | POA: Diagnosis not present

## 2020-02-03 DIAGNOSIS — M6281 Muscle weakness (generalized): Secondary | ICD-10-CM | POA: Diagnosis not present

## 2020-02-04 DIAGNOSIS — M6281 Muscle weakness (generalized): Secondary | ICD-10-CM | POA: Diagnosis not present

## 2020-02-04 DIAGNOSIS — Z9889 Other specified postprocedural states: Secondary | ICD-10-CM | POA: Diagnosis not present

## 2020-02-04 DIAGNOSIS — M25511 Pain in right shoulder: Secondary | ICD-10-CM | POA: Diagnosis not present

## 2020-02-04 DIAGNOSIS — M25611 Stiffness of right shoulder, not elsewhere classified: Secondary | ICD-10-CM | POA: Diagnosis not present

## 2020-02-08 DIAGNOSIS — M25611 Stiffness of right shoulder, not elsewhere classified: Secondary | ICD-10-CM | POA: Diagnosis not present

## 2020-02-08 DIAGNOSIS — Z96611 Presence of right artificial shoulder joint: Secondary | ICD-10-CM | POA: Diagnosis not present

## 2020-02-08 DIAGNOSIS — M6281 Muscle weakness (generalized): Secondary | ICD-10-CM | POA: Diagnosis not present

## 2020-02-08 DIAGNOSIS — Z9889 Other specified postprocedural states: Secondary | ICD-10-CM | POA: Diagnosis not present

## 2020-02-08 DIAGNOSIS — Z471 Aftercare following joint replacement surgery: Secondary | ICD-10-CM | POA: Diagnosis not present

## 2020-02-08 DIAGNOSIS — M25511 Pain in right shoulder: Secondary | ICD-10-CM | POA: Diagnosis not present

## 2020-02-10 DIAGNOSIS — M25611 Stiffness of right shoulder, not elsewhere classified: Secondary | ICD-10-CM | POA: Diagnosis not present

## 2020-02-10 DIAGNOSIS — M6281 Muscle weakness (generalized): Secondary | ICD-10-CM | POA: Diagnosis not present

## 2020-02-10 DIAGNOSIS — M25511 Pain in right shoulder: Secondary | ICD-10-CM | POA: Diagnosis not present

## 2020-02-10 DIAGNOSIS — G4733 Obstructive sleep apnea (adult) (pediatric): Secondary | ICD-10-CM | POA: Diagnosis not present

## 2020-02-10 DIAGNOSIS — Z9889 Other specified postprocedural states: Secondary | ICD-10-CM | POA: Diagnosis not present

## 2020-02-11 DIAGNOSIS — M6281 Muscle weakness (generalized): Secondary | ICD-10-CM | POA: Diagnosis not present

## 2020-02-11 DIAGNOSIS — Z9889 Other specified postprocedural states: Secondary | ICD-10-CM | POA: Diagnosis not present

## 2020-02-11 DIAGNOSIS — M25611 Stiffness of right shoulder, not elsewhere classified: Secondary | ICD-10-CM | POA: Diagnosis not present

## 2020-02-11 DIAGNOSIS — M25511 Pain in right shoulder: Secondary | ICD-10-CM | POA: Diagnosis not present

## 2020-02-15 ENCOUNTER — Ambulatory Visit (INDEPENDENT_AMBULATORY_CARE_PROVIDER_SITE_OTHER): Payer: BC Managed Care – PPO | Admitting: Family Medicine

## 2020-02-15 ENCOUNTER — Encounter (INDEPENDENT_AMBULATORY_CARE_PROVIDER_SITE_OTHER): Payer: Self-pay | Admitting: Family Medicine

## 2020-02-15 ENCOUNTER — Other Ambulatory Visit: Payer: Self-pay

## 2020-02-15 VITALS — BP 125/75 | HR 85 | Temp 97.7°F | Ht 71.0 in | Wt 251.0 lb

## 2020-02-15 DIAGNOSIS — M6281 Muscle weakness (generalized): Secondary | ICD-10-CM | POA: Diagnosis not present

## 2020-02-15 DIAGNOSIS — E559 Vitamin D deficiency, unspecified: Secondary | ICD-10-CM

## 2020-02-15 DIAGNOSIS — E1169 Type 2 diabetes mellitus with other specified complication: Secondary | ICD-10-CM

## 2020-02-15 DIAGNOSIS — Z6835 Body mass index (BMI) 35.0-35.9, adult: Secondary | ICD-10-CM

## 2020-02-15 DIAGNOSIS — M25511 Pain in right shoulder: Secondary | ICD-10-CM | POA: Diagnosis not present

## 2020-02-15 DIAGNOSIS — Z9189 Other specified personal risk factors, not elsewhere classified: Secondary | ICD-10-CM | POA: Diagnosis not present

## 2020-02-15 DIAGNOSIS — Z9889 Other specified postprocedural states: Secondary | ICD-10-CM | POA: Diagnosis not present

## 2020-02-15 DIAGNOSIS — M25611 Stiffness of right shoulder, not elsewhere classified: Secondary | ICD-10-CM | POA: Diagnosis not present

## 2020-02-15 DIAGNOSIS — E785 Hyperlipidemia, unspecified: Secondary | ICD-10-CM | POA: Diagnosis not present

## 2020-02-15 MED ORDER — VITAMIN D (ERGOCALCIFEROL) 1.25 MG (50000 UNIT) PO CAPS: 50000.0000 [IU] | ORAL_CAPSULE | ORAL | 0 refills | Status: DC

## 2020-02-16 NOTE — Progress Notes (Signed)
Chief Complaint:   OBESITY [name] is here to discuss his progress with his obesity treatment plan along with follow-up of his obesity related diagnoses.   Today's visit was #: 12 Starting weight: 310 lbs Starting date: 09/01/2019 Today's weight: 251 lbs Today's date: 02/15/2020 Total lbs lost to date: 59 lbs Body mass index is 35.01 kg/m.  Total weight loss percentage to date: -19.03%  Interim History: [name] says he ate a lot of sweets over the holidays.  Went off plan.  He has been doing PT for his right shoulder replacement.  He says he has not been drinking water enough over these last 2 weeks.  He is ready to get back on track.  Nutrition Plan: the Category 1 Plan 30% of the time.  Anti-obesity medications: Ozempic 0.5 mg weekly. Reported side effects: None. Hunger is moderately controlled.  Activity: PT for 40-60 minutes 3 times per week.  Assessment/Plan:   1. Type 2 diabetes mellitus with other specified complication, without long-term current use of insulin (HCC) Diabetes Mellitus: stable. Medication:  Ozempic 0.5 mg subcutaneously weekly and metformin XR 1,000 mg twice daily. Issues reviewed with him: blood sugar goals, complications of diabetes mellitus, hypoglycemia prevention and treatment, exercise, and nutrition.  Lab Results  Component Value Date   HGBA1C 5.4 11/17/2019   HGBA1C 6.6 (H) 09/01/2019   HGBA1C 8.8 (H) 03/13/2019   Lab Results  Component Value Date   LDLCALC 69 02/01/2020   CREATININE 1.51 (H) 11/17/2019   2. Vitamin D deficiency Not at goal. Current vitamin D is 31.7, tested on 09/01/2019. Optimal goal > 50 ng/dL.   Plan:  [x]   Continue Vitamin D @50 ,000 IU every week. []   Continue home supplement daily. [x]   Follow-up for routine testing of Vitamin D at least 2-3 times per year to avoid over-replacement.  - Refill Vitamin D, Ergocalciferol, (DRISDOL) 1.25 MG (50000 UNIT) CAPS capsule; Take 1 capsule (50,000 Units total) by mouth every 7  (seven) days.  Dispense: 4 capsule; Refill: 0  3. Hyperlipidemia associated with type 2 diabetes mellitus (HCC) Course: Controlled.  Lipid-lowering medications: Lipitor 10 mg daily.   Plan: Dietary changes: Increase soluble fiber. Decrease simple carbohydrates. Exercise changes: An average 40 minutes of moderate to vigorous-intensity aerobic activity 3 or 4 times per week.   Lab Results  Component Value Date   CHOL 132 02/01/2020   HDL 46 02/01/2020   LDLCALC 69 02/01/2020   TRIG 92 02/01/2020   CHOLHDL 2.9 02/01/2020   Lab Results  Component Value Date   ALT 25 02/01/2020   AST 27 02/01/2020   ALKPHOS 91 02/01/2020   BILITOT 1.1 02/01/2020   4. At risk for heart disease [name] was given approximately 9 minutes of coronary artery disease prevention counseling today. He is 60 y.o. male and has risk factors for heart disease including obesity and diabetes. We discussed intensive lifestyle modifications today with an emphasis on specific weight loss instructions and strategies. Repetitive spaced learning was employed today to elicit superior memory formation and behavioral change.  During insulin resistance, several metabolic alterations induce the development of cardiovascular disease. For instance, insulin resistance can induce an imbalance in glucose metabolism that generates chronic hyperglycemia, which in turn triggers oxidative stress and causes an inflammatory response that leads to cell damage. Insulin resistance can also alter systemic lipid metabolism which then leads to the development of dyslipidemia and the well-known lipid triad: (1) high levels of plasma triglycerides, (2) low levels of high-density  lipoprotein, and (3) the appearance of small dense low-density lipoproteins. This triad, along with endothelial dysfunction, which can also be induced by aberrant insulin signaling, contribute to atherosclerotic plaque formation.   5. Class 2 severe obesity with serious comorbidity  and body mass index (BMI) of 35.0 to 35.9 in adult, unspecified obesity type (HCC)  Course: [name] is currently in the action stage of change. As such, his goal is to continue with weight loss efforts.   Nutrition goals: He has agreed to the Category 1 Plan.   Exercise goals: As is.  Behavioral modification strategies: increasing lean protein intake, decreasing simple carbohydrates and increasing vegetables.  [name] has agreed to follow-up with our clinic in 2 weeks. He was informed of the importance of frequent follow-up visits to maximize his success with intensive lifestyle modifications for his multiple health conditions.   Objective:   Blood pressure 125/75, pulse 85, temperature 97.7 F (36.5 C), temperature source Oral, height 5\' 11"  (1.803 m), weight 251 lb (113.9 kg), SpO2 98 %. Body mass index is 35.01 kg/m.  General: Cooperative, alert, well developed, in no acute distress. HEENT: Conjunctivae and lids unremarkable. Cardiovascular: Regular rhythm.  Lungs: Normal work of breathing. Neurologic: No focal deficits.   Lab Results  Component Value Date   CREATININE 1.51 (H) 11/17/2019   BUN 22 (H) 11/17/2019   NA 137 11/17/2019   K 4.4 11/17/2019   CL 101 11/17/2019   CO2 24 11/17/2019   Lab Results  Component Value Date   ALT 25 02/01/2020   AST 27 02/01/2020   ALKPHOS 91 02/01/2020   BILITOT 1.1 02/01/2020   Lab Results  Component Value Date   HGBA1C 5.4 11/17/2019   HGBA1C 6.6 (H) 09/01/2019   HGBA1C 8.8 (H) 03/13/2019   HGBA1C (H) 04/26/2010    11.9 (NOTE)                                                                       According to the ADA Clinical Practice Recommendations for 2011, when HbA1c is used as a screening test:   >=6.5%   Diagnostic of Diabetes Mellitus           (if abnormal result  is confirmed)  5.7-6.4%   Increased risk of developing Diabetes Mellitus  References:Diagnosis and Classification of Diabetes Mellitus,Diabetes  Care,2011,34(Suppl 1):S62-S69 and Standards of Medical Care in         Diabetes - 2011,Diabetes Care,2011,34  (Suppl 1):S11-S61.   Lab Results  Component Value Date   INSULIN 28.9 (H) 09/01/2019   Lab Results  Component Value Date   TSH 3.220 09/01/2019   Lab Results  Component Value Date   CHOL 132 02/01/2020   HDL 46 02/01/2020   LDLCALC 69 02/01/2020   TRIG 92 02/01/2020   CHOLHDL 2.9 02/01/2020   Lab Results  Component Value Date   WBC 7.3 11/17/2019   HGB 14.2 11/17/2019   HCT 42.3 11/17/2019   MCV 89.4 11/17/2019   PLT 284 11/17/2019   Attestation Statements:   Reviewed by clinician on day of visit: allergies, medications, problem list, medical history, surgical history, family history, social history, and previous encounter notes.  I, 01/17/2020, CMA, am acting as Insurance claims handler for Energy manager,  DO  I have reviewed the above documentation for accuracy and completeness, and I agree with the above. Briscoe Deutscher, DO

## 2020-02-19 DIAGNOSIS — M25611 Stiffness of right shoulder, not elsewhere classified: Secondary | ICD-10-CM | POA: Diagnosis not present

## 2020-02-19 DIAGNOSIS — M25511 Pain in right shoulder: Secondary | ICD-10-CM | POA: Diagnosis not present

## 2020-02-19 DIAGNOSIS — Z9889 Other specified postprocedural states: Secondary | ICD-10-CM | POA: Diagnosis not present

## 2020-02-19 DIAGNOSIS — M6281 Muscle weakness (generalized): Secondary | ICD-10-CM | POA: Diagnosis not present

## 2020-02-22 DIAGNOSIS — Z9889 Other specified postprocedural states: Secondary | ICD-10-CM | POA: Diagnosis not present

## 2020-02-22 DIAGNOSIS — M6281 Muscle weakness (generalized): Secondary | ICD-10-CM | POA: Diagnosis not present

## 2020-02-22 DIAGNOSIS — M25611 Stiffness of right shoulder, not elsewhere classified: Secondary | ICD-10-CM | POA: Diagnosis not present

## 2020-02-22 DIAGNOSIS — M25511 Pain in right shoulder: Secondary | ICD-10-CM | POA: Diagnosis not present

## 2020-02-25 DIAGNOSIS — Z9889 Other specified postprocedural states: Secondary | ICD-10-CM | POA: Diagnosis not present

## 2020-02-25 DIAGNOSIS — M25511 Pain in right shoulder: Secondary | ICD-10-CM | POA: Diagnosis not present

## 2020-02-25 DIAGNOSIS — M25611 Stiffness of right shoulder, not elsewhere classified: Secondary | ICD-10-CM | POA: Diagnosis not present

## 2020-02-25 DIAGNOSIS — M6281 Muscle weakness (generalized): Secondary | ICD-10-CM | POA: Diagnosis not present

## 2020-02-26 ENCOUNTER — Encounter (INDEPENDENT_AMBULATORY_CARE_PROVIDER_SITE_OTHER): Payer: Self-pay | Admitting: Family Medicine

## 2020-02-26 DIAGNOSIS — E1169 Type 2 diabetes mellitus with other specified complication: Secondary | ICD-10-CM

## 2020-02-29 ENCOUNTER — Ambulatory Visit (INDEPENDENT_AMBULATORY_CARE_PROVIDER_SITE_OTHER): Payer: BC Managed Care – PPO | Admitting: Family Medicine

## 2020-02-29 MED ORDER — OZEMPIC (0.25 OR 0.5 MG/DOSE) 2 MG/1.5ML ~~LOC~~ SOPN: 0.5000 mg | PEN_INJECTOR | SUBCUTANEOUS | 0 refills | Status: DC

## 2020-02-29 NOTE — Telephone Encounter (Signed)
Dr. Kary Kos Pt

## 2020-02-29 NOTE — Telephone Encounter (Signed)
Last OV with Dr Wallace 

## 2020-03-03 DIAGNOSIS — M6281 Muscle weakness (generalized): Secondary | ICD-10-CM | POA: Diagnosis not present

## 2020-03-03 DIAGNOSIS — Z9889 Other specified postprocedural states: Secondary | ICD-10-CM | POA: Diagnosis not present

## 2020-03-03 DIAGNOSIS — M25611 Stiffness of right shoulder, not elsewhere classified: Secondary | ICD-10-CM | POA: Diagnosis not present

## 2020-03-03 DIAGNOSIS — M25511 Pain in right shoulder: Secondary | ICD-10-CM | POA: Diagnosis not present

## 2020-03-07 DIAGNOSIS — Z9889 Other specified postprocedural states: Secondary | ICD-10-CM | POA: Diagnosis not present

## 2020-03-07 DIAGNOSIS — M6281 Muscle weakness (generalized): Secondary | ICD-10-CM | POA: Diagnosis not present

## 2020-03-07 DIAGNOSIS — M25611 Stiffness of right shoulder, not elsewhere classified: Secondary | ICD-10-CM | POA: Diagnosis not present

## 2020-03-07 DIAGNOSIS — M25511 Pain in right shoulder: Secondary | ICD-10-CM | POA: Diagnosis not present

## 2020-03-10 DIAGNOSIS — M25611 Stiffness of right shoulder, not elsewhere classified: Secondary | ICD-10-CM | POA: Diagnosis not present

## 2020-03-10 DIAGNOSIS — M25511 Pain in right shoulder: Secondary | ICD-10-CM | POA: Diagnosis not present

## 2020-03-10 DIAGNOSIS — M6281 Muscle weakness (generalized): Secondary | ICD-10-CM | POA: Diagnosis not present

## 2020-03-10 DIAGNOSIS — Z9889 Other specified postprocedural states: Secondary | ICD-10-CM | POA: Diagnosis not present

## 2020-03-14 ENCOUNTER — Encounter (INDEPENDENT_AMBULATORY_CARE_PROVIDER_SITE_OTHER): Payer: Self-pay | Admitting: Family Medicine

## 2020-03-14 ENCOUNTER — Ambulatory Visit (INDEPENDENT_AMBULATORY_CARE_PROVIDER_SITE_OTHER): Payer: BC Managed Care – PPO | Admitting: Family Medicine

## 2020-03-14 ENCOUNTER — Other Ambulatory Visit: Payer: Self-pay

## 2020-03-14 VITALS — BP 143/83 | HR 92 | Temp 98.2°F | Ht 71.0 in | Wt 248.0 lb

## 2020-03-14 DIAGNOSIS — Z9189 Other specified personal risk factors, not elsewhere classified: Secondary | ICD-10-CM | POA: Diagnosis not present

## 2020-03-14 DIAGNOSIS — N1831 Chronic kidney disease, stage 3a: Secondary | ICD-10-CM | POA: Diagnosis not present

## 2020-03-14 DIAGNOSIS — E559 Vitamin D deficiency, unspecified: Secondary | ICD-10-CM

## 2020-03-14 DIAGNOSIS — E669 Obesity, unspecified: Secondary | ICD-10-CM

## 2020-03-14 DIAGNOSIS — E1169 Type 2 diabetes mellitus with other specified complication: Secondary | ICD-10-CM | POA: Diagnosis not present

## 2020-03-14 DIAGNOSIS — E66811 Obesity, class 1: Secondary | ICD-10-CM

## 2020-03-14 DIAGNOSIS — Z6834 Body mass index (BMI) 34.0-34.9, adult: Secondary | ICD-10-CM

## 2020-03-14 MED ORDER — VITAMIN D (ERGOCALCIFEROL) 1.25 MG (50000 UNIT) PO CAPS: 50000.0000 [IU] | ORAL_CAPSULE | ORAL | 0 refills | Status: DC

## 2020-03-14 MED ORDER — OZEMPIC (0.25 OR 0.5 MG/DOSE) 2 MG/1.5ML ~~LOC~~ SOPN: 0.5000 mg | PEN_INJECTOR | SUBCUTANEOUS | 0 refills | Status: DC

## 2020-03-15 LAB — COMPREHENSIVE METABOLIC PANEL
ALT: 28 IU/L (ref 0–44)
AST: 20 IU/L (ref 0–40)
Albumin/Globulin Ratio: 2 (ref 1.2–2.2)
Albumin: 4.6 g/dL (ref 3.8–4.9)
Alkaline Phosphatase: 80 IU/L (ref 44–121)
BUN/Creatinine Ratio: 19 (ref 9–20)
BUN: 23 mg/dL (ref 6–24)
Bilirubin Total: 0.7 mg/dL (ref 0.0–1.2)
CO2: 26 mmol/L (ref 20–29)
Calcium: 9.9 mg/dL (ref 8.7–10.2)
Chloride: 101 mmol/L (ref 96–106)
Creatinine, Ser: 1.2 mg/dL (ref 0.76–1.27)
GFR calc Af Amer: 77 mL/min/{1.73_m2} (ref >59)
GFR calc non Af Amer: 67 mL/min/{1.73_m2} (ref >59)
Globulin, Total: 2.3 g/dL (ref 1.5–4.5)
Glucose: 75 mg/dL (ref 65–99)
Potassium: 4.9 mmol/L (ref 3.5–5.2)
Sodium: 141 mmol/L (ref 134–144)
Total Protein: 6.9 g/dL (ref 6.0–8.5)

## 2020-03-15 LAB — HEMOGLOBIN A1C
Est. average glucose Bld gHb Est-mCnc: 114 mg/dL
Hgb A1c MFr Bld: 5.6 % (ref 4.8–5.6)

## 2020-03-15 LAB — VITAMIN D 25 HYDROXY (VIT D DEFICIENCY, FRACTURES): Vit D, 25-Hydroxy: 75.1 ng/mL (ref 30.0–100.0)

## 2020-03-15 NOTE — Progress Notes (Signed)
Chief Complaint:   OBESITY Lenwood is here to discuss his progress with his obesity treatment plan along with follow-up of his obesity related diagnoses. Aarish is on the Category 1 Plan and states he is following his eating plan approximately 0% of the time. Jullien states he is doing physical therapy 45 minutes 2 times per week.  Today's visit was #: 13 Starting weight: 310 lbs Starting date: 09/01/2019 Today's weight: 248 lbs Today's date: 03/14/2020 Total lbs lost to date: 62 lbs Total lbs lost since last in-office visit: 3 lbs Total weight loss percentage to date: -20.00%  Interim History: Nolawi is amazed he lost weight. He reports he has been making poor snack choices. He says breakfast and lunch are OK but he is bored with lunch choices and needs ideas for dinner.  Plan: Bryor is without insurance right now and is paying with cash. He wants to come every month and understands limitations of monthly visits.  Assessment/Plan:   1. Type 2 diabetes mellitus with other specified complication, without long-term current use of insulin (HCC) Pt's fasting blood sugar high was 160 and lowest 108. It is usually around 115. He is on Amaryl, Metformin, and Ozempic. He started taking Ozempic on 01/04/2020 and is tolerating it well.  Lab Results  Component Value Date   HGBA1C 5.6 03/14/2020   HGBA1C 5.4 11/17/2019   HGBA1C 6.6 (H) 09/01/2019   Lab Results  Component Value Date   LDLCALC 69 02/01/2020   CREATININE 1.20 03/14/2020   Lab Results  Component Value Date   INSULIN 28.9 (H) 09/01/2019   Plan: Check labs today. Refill Ozempic for 1 month, as per below. Continue home blood sugar monitoring.  Lab/Orders today or future: - Hemoglobin A1c  Refill- Semaglutide,0.25 or 0.5MG /DOS, (OZEMPIC, 0.25 OR 0.5 MG/DOSE,) 2 MG/1.5ML SOPN; Inject 0.5 mg into the skin once a week.  Dispense: 4.5 mL; Refill: 0  2. Stage 3a chronic kidney disease (HCC) Pt's serum creatinine was 1.51 on  11/17/2019. She is on losartan.  Ref. Range 11/17/2019 14:40  Creatinine Latest Ref Range: 0.61 - 1.24 mg/dL 0.17 (H)   BP Readings from Last 3 Encounters:  03/14/20 (!) 143/83  02/15/20 125/75  02/01/20 123/82    Plan: Recheck CMP today. BP goal is <140/90, which is almost at goal today. Recommend home blood pressure monitoring.  Lab/Orders today or future: - Comprehensive metabolic panel  3. Vitamin D deficiency Marquan's Vitamin D level was 31.7 on 09/01/2019. He is currently taking prescription vitamin D 50,000 IU each week. He denies nausea, vomiting or muscle weakness.   Ref. Range 09/01/2019 15:08  Vitamin D, 25-Hydroxy Latest Ref Range: 30.0 - 100.0 ng/mL 31.7   Plan: Check labs today. Refill Vit D for 1 month, as per below. Low Vitamin D level contributes to fatigue and are associated with obesity, breast, and colon cancer. He agrees to continue to take prescription Vitamin D @50 ,000 IU every week and will follow-up for routine testing of Vitamin D, at least 2-3 times per year to avoid over-replacement.  Refill- Vitamin D, Ergocalciferol, (DRISDOL) 1.25 MG (50000 UNIT) CAPS capsule; Take 1 capsule (50,000 Units total) by mouth every 7 (seven) days.  Dispense: 4 capsule; Refill: 0  Lab/Orders today or future: - VITAMIN D 25 Hydroxy (Vit-D Deficiency, Fractures)  4. At risk for heart disease Due to Lathon's current state of health and medical condition(s), he is at a higher risk for heart disease.   This puts the patient  at much greater risk to subsequently develop cardiopulmonary conditions that can significantly affect patient's quality of life in a negative manner as well.    At least 9 minutes was spent on counseling Ruel about these concerns today and I stressed the importance of reversing risks factors of obesity, esp truncal and visceral fat, hypertension, hyperlipidemia, pre-diabetes.   Initial goal is to lose at least 5-10% of starting weight to help reduce these risk  factors.   Counseling: Intensive lifestyle modifications discussed with Molly Maduro as most appropriate first line treatment.  he will continue to work on diet, exercise and weight loss efforts.  We will continue to reassess these conditions on a fairly regular basis in an attempt to decrease patient's overall morbidity and mortality.  Evidence-based interventions for health behavior change were utilized today including the discussion of self monitoring techniques, problem-solving barriers and SMART goal setting techniques.  Specifically regarding patient's less desirable eating habits and patterns, we employed the technique of small changes when Westlee has not been able to fully commit to his prudent nutritional plan.  5. Class 1 obesity with serious comorbidity and body mass index (BMI) of 34.0 to 34.9 in adult, unspecified obesity type Denson is currently in the action stage of change. As such, his goal is to continue with weight loss efforts. He has agreed to the Category 1 Plan.   Exercise goals: For substantial health benefits, adults should do at least 150 minutes (2 hours and 30 minutes) a week of moderate-intensity, or 75 minutes (1 hour and 15 minutes) a week of vigorous-intensity aerobic physical activity, or an equivalent combination of moderate- and vigorous-intensity aerobic activity. Aerobic activity should be performed in episodes of at least 10 minutes, and preferably, it should be spread throughout the week. Increase as tolerated to goal of 150 minutes a week.  Behavioral modification strategies: increasing lean protein intake, decreasing simple carbohydrates, increasing water intake, meal planning and cooking strategies, keeping healthy foods in the home, better snacking choices, avoiding temptations and planning for success.  Naseem has agreed to follow-up with our clinic in 4 weeks. He was informed of the importance of frequent follow-up visits to maximize his success with intensive  lifestyle modifications for his multiple health conditions.   Orders Placed This Encounter  Procedures  . Hemoglobin A1c  . VITAMIN D 25 Hydroxy (Vit-D Deficiency, Fractures)  . Comprehensive metabolic panel    Medications Discontinued During This Encounter  Medication Reason  . Vitamin D, Ergocalciferol, (DRISDOL) 1.25 MG (50000 UNIT) CAPS capsule Reorder  . Semaglutide,0.25 or 0.5MG /DOS, (OZEMPIC, 0.25 OR 0.5 MG/DOSE,) 2 MG/1.5ML SOPN Reorder     Meds ordered this encounter  Medications  . Vitamin D, Ergocalciferol, (DRISDOL) 1.25 MG (50000 UNIT) CAPS capsule    Sig: Take 1 capsule (50,000 Units total) by mouth every 7 (seven) days.    Dispense:  4 capsule    Refill:  0  . Semaglutide,0.25 or 0.5MG /DOS, (OZEMPIC, 0.25 OR 0.5 MG/DOSE,) 2 MG/1.5ML SOPN    Sig: Inject 0.5 mg into the skin once a week.    Dispense:  4.5 mL    Refill:  0     Lorry was informed we would discuss his lab results at his next visit unless there is a critical issue that needs to be addressed sooner. Naheim agreed to keep his next visit at the agreed upon time to discuss these results.  Objective:   Blood pressure (!) 143/83, pulse 92, temperature 98.2 F (36.8 C), height 5'  11" (1.803 m), weight 248 lb (112.5 kg), SpO2 99 %. Body mass index is 34.59 kg/m.  General: Cooperative, alert, well developed, in no acute distress. HEENT: Conjunctivae and lids unremarkable. Cardiovascular: Regular rhythm.  Lungs: Normal work of breathing. Neurologic: No focal deficits.   Lab Results  Component Value Date   CREATININE 1.20 03/14/2020   BUN 23 03/14/2020   NA 141 03/14/2020   K 4.9 03/14/2020   CL 101 03/14/2020   CO2 26 03/14/2020   Lab Results  Component Value Date   ALT 28 03/14/2020   AST 20 03/14/2020   ALKPHOS 80 03/14/2020   BILITOT 0.7 03/14/2020   Lab Results  Component Value Date   HGBA1C 5.6 03/14/2020   HGBA1C 5.4 11/17/2019   HGBA1C 6.6 (H) 09/01/2019   HGBA1C 8.8 (H)  03/13/2019   HGBA1C (H) 04/26/2010    11.9 (NOTE)                                                                       According to the ADA Clinical Practice Recommendations for 2011, when HbA1c is used as a screening test:   >=6.5%   Diagnostic of Diabetes Mellitus           (if abnormal result  is confirmed)  5.7-6.4%   Increased risk of developing Diabetes Mellitus  References:Diagnosis and Classification of Diabetes Mellitus,Diabetes Care,2011,34(Suppl 1):S62-S69 and Standards of Medical Care in         Diabetes - 2011,Diabetes Care,2011,34  (Suppl 1):S11-S61.   Lab Results  Component Value Date   INSULIN 28.9 (H) 09/01/2019   Lab Results  Component Value Date   TSH 3.220 09/01/2019   Lab Results  Component Value Date   CHOL 132 02/01/2020   HDL 46 02/01/2020   LDLCALC 69 02/01/2020   TRIG 92 02/01/2020   CHOLHDL 2.9 02/01/2020   Lab Results  Component Value Date   WBC 7.3 11/17/2019   HGB 14.2 11/17/2019   HCT 42.3 11/17/2019   MCV 89.4 11/17/2019   PLT 284 11/17/2019   No results found for: IRON, TIBC, FERRITIN  Attestation Statements:   Reviewed by clinician on day of visit: allergies, medications, problem list, medical history, surgical history, family history, social history, and previous encounter notes.  Edmund Hilda, am acting as Energy manager for [employer], DO.  I have reviewed the above documentation for accuracy and completeness, and I agree with the above. Carlye Grippe, D.O.  The 21st Century Cures Act was signed into law in 2016 which includes the topic of electronic health records.  This provides immediate access to information in MyChart.  This includes consultation notes, operative notes, office notes, lab results and pathology reports.  If you have any questions about what you read please let us know at your next visit so we can discuss your concerns and take corrective action if need be.  We are right here with you.

## 2020-03-29 ENCOUNTER — Encounter (INDEPENDENT_AMBULATORY_CARE_PROVIDER_SITE_OTHER): Payer: Self-pay | Admitting: Family Medicine

## 2020-03-29 NOTE — Telephone Encounter (Signed)
Call to patient.  After looking at the dose on the prescription, it is a 3 month supply.  Pt is aware.

## 2020-04-08 DIAGNOSIS — E1140 Type 2 diabetes mellitus with diabetic neuropathy, unspecified: Secondary | ICD-10-CM | POA: Diagnosis not present

## 2020-04-08 DIAGNOSIS — Z125 Encounter for screening for malignant neoplasm of prostate: Secondary | ICD-10-CM | POA: Diagnosis not present

## 2020-04-08 DIAGNOSIS — G4733 Obstructive sleep apnea (adult) (pediatric): Secondary | ICD-10-CM | POA: Diagnosis not present

## 2020-04-08 DIAGNOSIS — I25119 Atherosclerotic heart disease of native coronary artery with unspecified angina pectoris: Secondary | ICD-10-CM | POA: Diagnosis not present

## 2020-04-08 DIAGNOSIS — Z0000 Encounter for general adult medical examination without abnormal findings: Secondary | ICD-10-CM | POA: Diagnosis not present

## 2020-04-08 DIAGNOSIS — R3129 Other microscopic hematuria: Secondary | ICD-10-CM | POA: Diagnosis not present

## 2020-04-08 DIAGNOSIS — I2510 Atherosclerotic heart disease of native coronary artery without angina pectoris: Secondary | ICD-10-CM | POA: Diagnosis not present

## 2020-04-11 ENCOUNTER — Ambulatory Visit (INDEPENDENT_AMBULATORY_CARE_PROVIDER_SITE_OTHER): Payer: BC Managed Care – PPO | Admitting: Family Medicine

## 2020-04-11 ENCOUNTER — Encounter (INDEPENDENT_AMBULATORY_CARE_PROVIDER_SITE_OTHER): Payer: Self-pay | Admitting: Family Medicine

## 2020-04-11 ENCOUNTER — Other Ambulatory Visit: Payer: Self-pay

## 2020-04-11 VITALS — BP 152/78 | HR 80 | Temp 97.9°F | Ht 71.0 in | Wt 251.0 lb

## 2020-04-11 DIAGNOSIS — E1169 Type 2 diabetes mellitus with other specified complication: Secondary | ICD-10-CM | POA: Diagnosis not present

## 2020-04-11 DIAGNOSIS — Z9189 Other specified personal risk factors, not elsewhere classified: Secondary | ICD-10-CM | POA: Diagnosis not present

## 2020-04-11 DIAGNOSIS — E559 Vitamin D deficiency, unspecified: Secondary | ICD-10-CM | POA: Diagnosis not present

## 2020-04-11 DIAGNOSIS — Z6835 Body mass index (BMI) 35.0-35.9, adult: Secondary | ICD-10-CM | POA: Diagnosis not present

## 2020-04-11 MED ORDER — GLIMEPIRIDE 1 MG PO TABS: 1.0000 mg | ORAL_TABLET | Freq: Two times a day (BID) | ORAL | 0 refills | Status: DC

## 2020-04-11 MED ORDER — VITAMIN D (ERGOCALCIFEROL) 1.25 MG (50000 UNIT) PO CAPS: 50000.0000 [IU] | ORAL_CAPSULE | ORAL | 0 refills | Status: DC

## 2020-04-11 MED ORDER — OZEMPIC (1 MG/DOSE) 2 MG/1.5ML ~~LOC~~ SOPN: 1.0000 mg | PEN_INJECTOR | SUBCUTANEOUS | 0 refills | Status: DC

## 2020-04-13 NOTE — Progress Notes (Signed)
Chief Complaint:   OBESITY [name] is here to discuss his progress with his obesity treatment plan along with follow-up of his obesity related diagnoses.   Today's visit was #: 14 Starting weight: 310 lbs Starting date: 09/01/2019 Today's weight: 251 lbs Today's date: 04/11/2020 Total lbs lost to date: 59 lbs Body mass index is 35.01 kg/m.  Total weight loss percentage to date: -19.03%  Interim History:  Yani joined [employer] 2 weeks ago.  He has gained 3.5 pounds of water weight this time.  Current Meal Plan: the Category 1 Plan for 33% of the time.  Current Exercise Plan: Going to the gym for 60 minutes 3 times per week. Current Anti-Obesity Medications: Ozempic 0.5 mg subcutaneously weekly. Side effects: None.  Assessment/Plan:   No orders of the defined types were placed in this encounter.   Medications Discontinued During This Encounter  Medication Reason  . Semaglutide,0.25 or 0.5MG /DOS, (OZEMPIC, 0.25 OR 0.5 MG/DOSE,) 2 MG/1.5ML SOPN Dose change  . glimepiride (AMARYL) 1 MG tablet Reorder  . Vitamin D, Ergocalciferol, (DRISDOL) 1.25 MG (50000 UNIT) CAPS capsule Reorder     Meds ordered this encounter  Medications  . Vitamin D, Ergocalciferol, (DRISDOL) 1.25 MG (50000 UNIT) CAPS capsule    Sig: Take 1 capsule (50,000 Units total) by mouth every 7 (seven) days.    Dispense:  4 capsule    Refill:  0  . Semaglutide, 1 MG/DOSE, (OZEMPIC, 1 MG/DOSE,) 2 MG/1.5ML SOPN    Sig: Inject 1 mg into the skin once a week.    Dispense:  9 mL    Refill:  0  . glimepiride (AMARYL) 1 MG tablet    Sig: Take 1 tablet (1 mg total) by mouth in the morning and at bedtime.    Dispense:  60 tablet    Refill:  0     1. Type 2 diabetes mellitus with other specified complication, without long-term current use of insulin (HCC) Diabetes Mellitus: Controlled. Medication: glimepiride 1 mg in the morning, metformin 1,000 mg twice daily, Ozempic 0.5 mg subcutaneously weekly. Issues  reviewed: blood sugar goals, complications of diabetes mellitus, hypoglycemia prevention and treatment, exercise, and nutrition.  FBS:  Highest 148, lowest 108.  Average 120 or so.  Plan: The importance of regular follow up with PCP and all other specialists as scheduled was stressed to patient today.  Will refill glimepiride and increase Ozempic today, as per below.  Lab Results  Component Value Date   HGBA1C 5.6 03/14/2020   HGBA1C 5.4 11/17/2019   HGBA1C 6.6 (H) 09/01/2019   Lab Results  Component Value Date   LDLCALC 69 02/01/2020   CREATININE 1.20 03/14/2020   - Incrase Semaglutide, 1 MG/DOSE, (OZEMPIC, 1 MG/DOSE,) 2 MG/1.5ML SOPN; Inject 1 mg into the skin once a week.  Dispense: 9 mL; Refill: 0 - Refill glimepiride (AMARYL) 1 MG tablet; Take 1 tablet (1 mg total) by mouth in the morning and at bedtime.  Dispense: 60 tablet; Refill: 0  2. Vitamin D deficiency At goal. Current vitamin D is 75.1, tested on 03/14/2020. Optimal goal > 50 ng/dL.  He is taking vitamin D 50,000 IU weekly.  Plan: Continue to take prescription Vitamin D @50 ,000 IU every week as prescribed.  Follow-up for routine testing of Vitamin D, at least 2-3 times per year to avoid over-replacement.  - Refill Vitamin D, Ergocalciferol, (DRISDOL) 1.25 MG (50000 UNIT) CAPS capsule; Take 1 capsule (50,000 Units total) by mouth every 7 (seven) days.  Dispense: 4 capsule; Refill: 0  3. At risk for hypoglycemia Angelica was given approximately 8 minutes of counseling today regarding prevention of hypoglycemia.  He is at risk as he has started exercising.  He was advised of symptoms of hypoglycemia.  Kowen was instructed to avoid skipping meals and to eat regular protein-rich meals as prescribed on the meal plan.  The patient should have readily available low calorie snacks as needed, such as [employer] fruit snack packs, if blood sugar goes too low.   4. Class 2 severe obesity with serious comorbidity and body mass index (BMI) of  35.0 to 35.9 in adult, unspecified obesity type (HCC)  Course: Tandre is currently in the action stage of change. As such, his goal is to continue with weight loss efforts.   Nutrition goals: He has agreed to the Category 1 Plan.   Exercise goals: As is.  Behavioral modification strategies: meal planning and cooking strategies, keeping healthy foods in the home and planning for success.  Maxfield has agreed to follow-up with our clinic in 3-4 weeks. He was informed of the importance of frequent follow-up visits to maximize his success with intensive lifestyle modifications for his multiple health conditions.   Objective:   Blood pressure (!) 152/78, pulse 80, temperature 97.9 F (36.6 C), height 5\' 11"  (1.803 m), weight 251 lb (113.9 kg), SpO2 98 %. Body mass index is 35.01 kg/m.  General: Cooperative, alert, well developed, in no acute distress. HEENT: Conjunctivae and lids unremarkable. Cardiovascular: Regular rhythm.  Lungs: Normal work of breathing. Neurologic: No focal deficits.   Lab Results  Component Value Date   CREATININE 1.20 03/14/2020   BUN 23 03/14/2020   NA 141 03/14/2020   K 4.9 03/14/2020   CL 101 03/14/2020   CO2 26 03/14/2020   Lab Results  Component Value Date   ALT 28 03/14/2020   AST 20 03/14/2020   ALKPHOS 80 03/14/2020   BILITOT 0.7 03/14/2020   Lab Results  Component Value Date   HGBA1C 5.6 03/14/2020   HGBA1C 5.4 11/17/2019   HGBA1C 6.6 (H) 09/01/2019   HGBA1C 8.8 (H) 03/13/2019   HGBA1C (H) 04/26/2010    11.9 (NOTE)                                                                       According to the ADA Clinical Practice Recommendations for 2011, when HbA1c is used as a screening test:   >=6.5%   Diagnostic of Diabetes Mellitus           (if abnormal result  is confirmed)  5.7-6.4%   Increased risk of developing Diabetes Mellitus  References:Diagnosis and Classification of Diabetes Mellitus,Diabetes Care,2011,34(Suppl 1):S62-S69 and  Standards of Medical Care in         Diabetes - 2011,Diabetes Care,2011,34  (Suppl 1):S11-S61.   Lab Results  Component Value Date   INSULIN 28.9 (H) 09/01/2019   Lab Results  Component Value Date   TSH 3.220 09/01/2019   Lab Results  Component Value Date   CHOL 132 02/01/2020   HDL 46 02/01/2020   LDLCALC 69 02/01/2020   TRIG 92 02/01/2020   CHOLHDL 2.9 02/01/2020   Lab Results  Component Value Date   WBC 7.3  11/17/2019   HGB 14.2 11/17/2019   HCT 42.3 11/17/2019   MCV 89.4 11/17/2019   PLT 284 11/17/2019   Attestation Statements:   Reviewed by clinician on day of visit: allergies, medications, problem list, medical history, surgical history, family history, social history, and previous encounter notes.  I, Insurance claims handler, CMA, am acting as Energy manager for [employer], DO.  I have reviewed the above documentation for accuracy and completeness, and I agree with the above. Carlye Grippe, D.O.  The 21st Century Cures Act was signed into law in 2016 which includes the topic of electronic health records.  This provides immediate access to information in MyChart.  This includes consultation notes, operative notes, office notes, lab results and pathology reports.  If you have any questions about what you read please let us know at your next visit so we can discuss your concerns and take corrective action if need be.  We are right here with you.

## 2020-04-20 ENCOUNTER — Ambulatory Visit: Payer: BC Managed Care – PPO | Admitting: Sports Medicine

## 2020-04-20 ENCOUNTER — Encounter: Payer: Self-pay | Admitting: Sports Medicine

## 2020-04-20 ENCOUNTER — Other Ambulatory Visit: Payer: Self-pay

## 2020-04-20 DIAGNOSIS — T25229A Burn of second degree of unspecified foot, initial encounter: Secondary | ICD-10-CM | POA: Insufficient documentation

## 2020-04-20 DIAGNOSIS — S90822D Blister (nonthermal), left foot, subsequent encounter: Secondary | ICD-10-CM | POA: Diagnosis not present

## 2020-04-20 DIAGNOSIS — M19019 Primary osteoarthritis, unspecified shoulder: Secondary | ICD-10-CM | POA: Insufficient documentation

## 2020-04-20 DIAGNOSIS — M2041 Other hammer toe(s) (acquired), right foot: Secondary | ICD-10-CM | POA: Insufficient documentation

## 2020-04-20 DIAGNOSIS — Z0000 Encounter for general adult medical examination without abnormal findings: Secondary | ICD-10-CM | POA: Insufficient documentation

## 2020-04-20 DIAGNOSIS — R21 Rash and other nonspecific skin eruption: Secondary | ICD-10-CM | POA: Insufficient documentation

## 2020-04-20 DIAGNOSIS — Z89421 Acquired absence of other right toe(s): Secondary | ICD-10-CM | POA: Diagnosis not present

## 2020-04-20 DIAGNOSIS — N529 Male erectile dysfunction, unspecified: Secondary | ICD-10-CM | POA: Insufficient documentation

## 2020-04-20 DIAGNOSIS — J4520 Mild intermittent asthma, uncomplicated: Secondary | ICD-10-CM | POA: Insufficient documentation

## 2020-04-20 DIAGNOSIS — F331 Major depressive disorder, recurrent, moderate: Secondary | ICD-10-CM | POA: Insufficient documentation

## 2020-04-20 DIAGNOSIS — L84 Corns and callosities: Secondary | ICD-10-CM | POA: Insufficient documentation

## 2020-04-20 DIAGNOSIS — M109 Gout, unspecified: Secondary | ICD-10-CM | POA: Insufficient documentation

## 2020-04-20 DIAGNOSIS — J9801 Acute bronchospasm: Secondary | ICD-10-CM | POA: Insufficient documentation

## 2020-04-20 DIAGNOSIS — L089 Local infection of the skin and subcutaneous tissue, unspecified: Secondary | ICD-10-CM | POA: Insufficient documentation

## 2020-04-20 DIAGNOSIS — R3129 Other microscopic hematuria: Secondary | ICD-10-CM | POA: Insufficient documentation

## 2020-04-20 DIAGNOSIS — M25372 Other instability, left ankle: Secondary | ICD-10-CM

## 2020-04-20 DIAGNOSIS — M14679 Charcot's joint, unspecified ankle and foot: Secondary | ICD-10-CM | POA: Diagnosis not present

## 2020-04-20 DIAGNOSIS — E1140 Type 2 diabetes mellitus with diabetic neuropathy, unspecified: Secondary | ICD-10-CM | POA: Insufficient documentation

## 2020-04-20 DIAGNOSIS — B351 Tinea unguium: Secondary | ICD-10-CM | POA: Insufficient documentation

## 2020-04-20 DIAGNOSIS — K5730 Diverticulosis of large intestine without perforation or abscess without bleeding: Secondary | ICD-10-CM | POA: Insufficient documentation

## 2020-04-20 DIAGNOSIS — R7301 Impaired fasting glucose: Secondary | ICD-10-CM | POA: Insufficient documentation

## 2020-04-20 DIAGNOSIS — R609 Edema, unspecified: Secondary | ICD-10-CM | POA: Insufficient documentation

## 2020-04-20 DIAGNOSIS — S93402A Sprain of unspecified ligament of left ankle, initial encounter: Secondary | ICD-10-CM | POA: Insufficient documentation

## 2020-04-20 DIAGNOSIS — E7800 Pure hypercholesterolemia, unspecified: Secondary | ICD-10-CM | POA: Insufficient documentation

## 2020-04-20 DIAGNOSIS — E1165 Type 2 diabetes mellitus with hyperglycemia: Secondary | ICD-10-CM | POA: Insufficient documentation

## 2020-04-20 DIAGNOSIS — J069 Acute upper respiratory infection, unspecified: Secondary | ICD-10-CM | POA: Insufficient documentation

## 2020-04-20 DIAGNOSIS — M542 Cervicalgia: Secondary | ICD-10-CM | POA: Insufficient documentation

## 2020-04-20 DIAGNOSIS — L03115 Cellulitis of right lower limb: Secondary | ICD-10-CM | POA: Insufficient documentation

## 2020-04-20 DIAGNOSIS — E291 Testicular hypofunction: Secondary | ICD-10-CM | POA: Insufficient documentation

## 2020-04-20 DIAGNOSIS — K0510 Chronic gingivitis, plaque induced: Secondary | ICD-10-CM | POA: Insufficient documentation

## 2020-04-20 DIAGNOSIS — R202 Paresthesia of skin: Secondary | ICD-10-CM | POA: Insufficient documentation

## 2020-04-20 DIAGNOSIS — Z79899 Other long term (current) drug therapy: Secondary | ICD-10-CM | POA: Insufficient documentation
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
## 2020-04-26 ENCOUNTER — Other Ambulatory Visit (INDEPENDENT_AMBULATORY_CARE_PROVIDER_SITE_OTHER): Payer: Self-pay | Admitting: Family Medicine

## 2020-04-26 DIAGNOSIS — E1159 Type 2 diabetes mellitus with other circulatory complications: Secondary | ICD-10-CM

## 2020-04-26 DIAGNOSIS — I152 Hypertension secondary to endocrine disorders: Secondary | ICD-10-CM

## 2020-04-27 ENCOUNTER — Encounter (INDEPENDENT_AMBULATORY_CARE_PROVIDER_SITE_OTHER): Payer: Self-pay | Admitting: Family Medicine

## 2020-04-27 DIAGNOSIS — E1169 Type 2 diabetes mellitus with other specified complication: Secondary | ICD-10-CM

## 2020-04-28 NOTE — Telephone Encounter (Signed)
Last seen by Dr. Opalski. 

## 2020-05-02 ENCOUNTER — Other Ambulatory Visit (INDEPENDENT_AMBULATORY_CARE_PROVIDER_SITE_OTHER): Payer: Self-pay | Admitting: Family Medicine

## 2020-05-02 DIAGNOSIS — E1169 Type 2 diabetes mellitus with other specified complication: Secondary | ICD-10-CM

## 2020-05-03 ENCOUNTER — Other Ambulatory Visit (INDEPENDENT_AMBULATORY_CARE_PROVIDER_SITE_OTHER): Payer: Self-pay

## 2020-05-03 DIAGNOSIS — I152 Hypertension secondary to endocrine disorders: Secondary | ICD-10-CM

## 2020-05-03 DIAGNOSIS — E1159 Type 2 diabetes mellitus with other circulatory complications: Secondary | ICD-10-CM

## 2020-05-03 MED ORDER — GLIMEPIRIDE 1 MG PO TABS: 1.0000 mg | ORAL_TABLET | Freq: Two times a day (BID) | ORAL | 0 refills | Status: DC

## 2020-05-03 MED ORDER — LOSARTAN POTASSIUM 50 MG PO TABS: 50.0000 mg | ORAL_TABLET | Freq: Every day | ORAL | 0 refills | Status: DC

## 2020-05-03 NOTE — Telephone Encounter (Signed)
Dr Sharee Holster, this is a request for a refill.  Pt states he accidentally tossed his medication out.  Please advise, thanks [place]

## 2020-05-03 NOTE — Telephone Encounter (Signed)
[name], ok to rf 30d supply of both losartan and glimepiride.  TY -Dr Val Eagle

## 2020-05-06 DIAGNOSIS — Z471 Aftercare following joint replacement surgery: Secondary | ICD-10-CM | POA: Diagnosis not present

## 2020-05-06 DIAGNOSIS — Z96611 Presence of right artificial shoulder joint: Secondary | ICD-10-CM | POA: Diagnosis not present

## 2020-05-09 ENCOUNTER — Encounter (INDEPENDENT_AMBULATORY_CARE_PROVIDER_SITE_OTHER): Payer: Self-pay | Admitting: Family Medicine

## 2020-05-09 ENCOUNTER — Other Ambulatory Visit: Payer: Self-pay

## 2020-05-09 ENCOUNTER — Ambulatory Visit (INDEPENDENT_AMBULATORY_CARE_PROVIDER_SITE_OTHER): Payer: BC Managed Care – PPO | Admitting: Family Medicine

## 2020-05-09 VITALS — BP 150/79 | HR 75 | Temp 97.7°F | Ht 71.0 in | Wt 254.0 lb

## 2020-05-09 DIAGNOSIS — Z6841 Body Mass Index (BMI) 40.0 and over, adult: Secondary | ICD-10-CM

## 2020-05-09 DIAGNOSIS — E559 Vitamin D deficiency, unspecified: Secondary | ICD-10-CM | POA: Diagnosis not present

## 2020-05-09 DIAGNOSIS — E1169 Type 2 diabetes mellitus with other specified complication: Secondary | ICD-10-CM

## 2020-05-09 DIAGNOSIS — Z9189 Other specified personal risk factors, not elsewhere classified: Secondary | ICD-10-CM | POA: Diagnosis not present

## 2020-05-09 DIAGNOSIS — E1159 Type 2 diabetes mellitus with other circulatory complications: Secondary | ICD-10-CM | POA: Diagnosis not present

## 2020-05-09 DIAGNOSIS — I152 Hypertension secondary to endocrine disorders: Secondary | ICD-10-CM

## 2020-05-09 MED ORDER — VITAMIN D (ERGOCALCIFEROL) 1.25 MG (50000 UNIT) PO CAPS: 50000.0000 [IU] | ORAL_CAPSULE | ORAL | 0 refills | Status: DC

## 2020-05-16 NOTE — Progress Notes (Signed)
Chief Complaint:   OBESITY [name] is here to discuss his progress with his obesity treatment plan along with follow-up of his obesity related diagnoses.   Today's visit was #: 15 Starting weight: 310 lbs Starting date: 09/01/2019 Today's weight: 254 lbs Today's date: 05/09/2020 Total lbs lost to date: 56 lbs Body mass index is 35.43 kg/m.  Total weight loss percentage to date: -18.06%  Interim History:  [name] has been going to more social gatherings with friends.  He has increased his food and alcohol intake.   Current Meal Plan: the Category 1 Plan for 50% of the time.  Current Exercise Plan: Going to the gym/biking for 30-60 minutes 4 times per week. Current Anti-Obesity Medications: Ozempic 1 mg subcutaneously weekly. Side effects: None.  Assessment/Plan:   No orders of the defined types were placed in this encounter.   Medications Discontinued During This Encounter  Medication Reason  . Vitamin D, Ergocalciferol, (DRISDOL) 1.25 MG (50000 UNIT) CAPS capsule Reorder     Meds ordered this encounter  Medications  . Vitamin D, Ergocalciferol, (DRISDOL) 1.25 MG (50000 UNIT) CAPS capsule    Sig: Take 1 capsule (50,000 Units total) by mouth every 7 (seven) days.    Dispense:  4 capsule    Refill:  0     1. Type 2 diabetes mellitus with other specified complication, without long-term current use of insulin (HCC) Diabetes Mellitus: At goal. Medication: Amaryl 1 mg daily, metformin 1,000 mg twice daily, Ozempic 1 mg subcutaneously weekly. Issues reviewed: blood sugar goals, complications of diabetes mellitus, hypoglycemia prevention and treatment, exercise, and nutrition.  FBS lowest 98-120. Increased Ozempic dose at last office visit.  Tolerating well and he says it is working well for him.  Plan:  Continue Ozempic.  Continue metformin and Amaryl.  Continue prudent nutritional plan.  Increase water intake and continue exercise.  The importance of regular follow up with PCP  and all other specialists as scheduled was stressed to patient today.  Lab Results  Component Value Date   HGBA1C 5.6 03/14/2020   HGBA1C 5.4 11/17/2019   HGBA1C 6.6 (H) 09/01/2019   Lab Results  Component Value Date   LDLCALC 69 02/01/2020   CREATININE 1.20 03/14/2020   2. Hypertension associated with type 2 diabetes mellitus (HCC) Not at goal. Medications: Norvasc 5 mg daily, Cozaar 50 mg daily, clonidine 0.2 mg every night.  He has increased his salt intake a lot lately.  He has been eating Chex Mix.  Plan: Not at goal. Avoid buying foods that are: processed, frozen, or prepackaged to avoid excess salt. Ambulatory blood pressure monitoring was encouraged with a goal of at least 2-3 times weekly or when feeling poorly.  He was instructed to keep a log for [place] to review at each office visit.   We will continue to monitor closely alongside his PCP and/or Specialist.  Regular follow up with PCP and specialists was also encouraged.  If blood pressure is not at goal at home, will need to increase losartan at next office visit.  Continue medications.  BP Readings from Last 3 Encounters:  05/09/20 (!) 150/79  04/11/20 (!) 152/78  03/14/20 (!) 143/83   Lab Results  Component Value Date   CREATININE 1.20 03/14/2020   3. Vitamin D deficiency Controlled. Current vitamin D is 75.1, tested on 03/14/2020. Optimal goal > 50 ng/dL.  He is taking vitamin D 50,000 IU weekly.  Plan: Continue to take prescription Vitamin D @50 ,000 IU every  week as prescribed.  Follow-up for routine testing of Vitamin D, at least 2-3 times per year to avoid over-replacement.  - Refill Vitamin D, Ergocalciferol, (DRISDOL) 1.25 MG (50000 UNIT) CAPS capsule; Take 1 capsule (50,000 Units total) by mouth every 7 (seven) days.  Dispense: 4 capsule; Refill: 0  4. At risk for heart disease Due to [name]'s current state of health and medical condition(s), he is at a higher risk for heart disease.  This puts the patient at much  greater risk to subsequently develop cardiopulmonary conditions that can significantly affect patient's quality of life in a negative manner.    At least 23 minutes were spent on counseling [name] about these concerns today, and I stressed the importance of reversing risks factors of obesity, especially truncal and visceral fat, hypertension, hyperlipidemia, and pre-diabetes.  The initial goal is to lose at least 5-10% of starting weight to help reduce these risk factors.  Counseling:  Intensive lifestyle modifications were discussed with James Maduro as the most appropriate first line of treatment.  he will continue to work on diet, exercise, and weight loss efforts.  We will continue to reassess these conditions on a fairly regular basis in an attempt to decrease the patient's overall morbidity and mortality.  Evidence-based interventions for health behavior change were utilized today including the discussion of self monitoring techniques, problem-solving barriers, and SMART goal setting techniques.  Specifically, regarding patient's less desirable eating habits and patterns, we employed the technique of small changes when [name] has not been able to fully commit to his prudent nutritional plan.  5. Obesity, current BMI 35.5  Course: [name] is currently in the action stage of change. As such, his goal is to continue with weight loss efforts.   Nutrition goals: He has agreed to the Category 1 Plan.   Exercise goals: As is.  Behavioral modification strategies: increasing lean protein intake, decreasing simple carbohydrates, meal planning and cooking strategies and planning for success.  [name] has agreed to follow-up with our clinic in 3-4 weeks. He was informed of the importance of frequent follow-up visits to maximize his success with intensive lifestyle modifications for his multiple health conditions.   Objective:   Blood pressure (!) 150/79, pulse 75, temperature 97.7 F (36.5 C), height 5\' 11"   (1.803 m), weight 254 lb (115.2 kg), SpO2 99 %. Body mass index is 35.43 kg/m.  General: Cooperative, alert, well developed, in no acute distress. HEENT: Conjunctivae and lids unremarkable. Cardiovascular: Regular rhythm.  Lungs: Normal work of breathing. Neurologic: No focal deficits.   Lab Results  Component Value Date   CREATININE 1.20 03/14/2020   BUN 23 03/14/2020   NA 141 03/14/2020   K 4.9 03/14/2020   CL 101 03/14/2020   CO2 26 03/14/2020   Lab Results  Component Value Date   ALT 28 03/14/2020   AST 20 03/14/2020   ALKPHOS 80 03/14/2020   BILITOT 0.7 03/14/2020   Lab Results  Component Value Date   HGBA1C 5.6 03/14/2020   HGBA1C 5.4 11/17/2019   HGBA1C 6.6 (H) 09/01/2019   HGBA1C 8.8 (H) 03/13/2019   HGBA1C (H) 04/26/2010    11.9 (NOTE)  According to the ADA Clinical Practice Recommendations for 2011, when HbA1c is used as a screening test:   >=6.5%   Diagnostic of Diabetes Mellitus           (if abnormal result  is confirmed)  5.7-6.4%   Increased risk of developing Diabetes Mellitus  References:Diagnosis and Classification of Diabetes Mellitus,Diabetes Care,2011,34(Suppl 1):S62-S69 and Standards of Medical Care in         Diabetes - 2011,Diabetes Care,2011,34  (Suppl 1):S11-S61.   Lab Results  Component Value Date   INSULIN 28.9 (H) 09/01/2019   Lab Results  Component Value Date   TSH 3.220 09/01/2019   Lab Results  Component Value Date   CHOL 132 02/01/2020   HDL 46 02/01/2020   LDLCALC 69 02/01/2020   TRIG 92 02/01/2020   CHOLHDL 2.9 02/01/2020   Lab Results  Component Value Date   WBC 7.3 11/17/2019   HGB 14.2 11/17/2019   HCT 42.3 11/17/2019   MCV 89.4 11/17/2019   PLT 284 11/17/2019   Attestation Statements:   Reviewed by clinician on day of visit: allergies, medications, problem list, medical history, surgical history, family history, social history, and previous  encounter notes.  I, Insurance claims handler, CMA, am acting as Energy manager for [employer], DO.  I have reviewed the above documentation for accuracy and completeness, and I agree with the above. Carlye Grippe, D.O.  The 21st Century Cures Act was signed into law in 2016 which includes the topic of electronic health records.  This provides immediate access to information in MyChart.  This includes consultation notes, operative notes, office notes, lab results and pathology reports.  If you have any questions about what you read please let us know at your next visit so we can discuss your concerns and take corrective action if need be.  We are right here with you.

## 2020-05-18 DIAGNOSIS — G4733 Obstructive sleep apnea (adult) (pediatric): Secondary | ICD-10-CM | POA: Diagnosis not present

## 2020-05-19 ENCOUNTER — Other Ambulatory Visit: Payer: Self-pay

## 2020-05-19 ENCOUNTER — Ambulatory Visit (INDEPENDENT_AMBULATORY_CARE_PROVIDER_SITE_OTHER): Payer: BC Managed Care – PPO | Admitting: Ophthalmology

## 2020-05-19 ENCOUNTER — Encounter (INDEPENDENT_AMBULATORY_CARE_PROVIDER_SITE_OTHER): Payer: Self-pay | Admitting: Ophthalmology

## 2020-05-19 DIAGNOSIS — G4733 Obstructive sleep apnea (adult) (pediatric): Secondary | ICD-10-CM | POA: Diagnosis not present

## 2020-05-19 DIAGNOSIS — E113412 Type 2 diabetes mellitus with severe nonproliferative diabetic retinopathy with macular edema, left eye: Secondary | ICD-10-CM

## 2020-05-19 DIAGNOSIS — E113411 Type 2 diabetes mellitus with severe nonproliferative diabetic retinopathy with macular edema, right eye: Secondary | ICD-10-CM | POA: Diagnosis not present

## 2020-05-19 NOTE — Assessment & Plan Note (Signed)
Minor CSME remains, with good acuity will observe as CBGs and sleep apnea each under control now

## 2020-05-19 NOTE — Assessment & Plan Note (Signed)
Minor CSME remains, with good acuity will observe as CBGs and sleep apnea each under control now 

## 2020-05-19 NOTE — Progress Notes (Signed)
05/19/2020     CHIEF COMPLAINT Patient presents for Retina Follow Up (4 Month diabetic f\u OU. FP and OCT/Pt states vision is stable. Denies new floaters and FOL/BGL: 112/A1C: 5.6)   HISTORY OF PRESENT ILLNESS: [name] is a 60 y.o. male who presents to the clinic today for:   HPI    Retina Follow Up    Patient presents with  Diabetic Retinopathy.  In both eyes.  Severity is severe.  Duration of 4 months.  Since onset it is stable.  I, the attending physician,  performed the HPI with the patient and updated documentation appropriately. Additional comments: 4 Month diabetic f\u OU. FP and OCT Pt states vision is stable. Denies new floaters and FOL BGL: 112 A1C: 5.6       Last edited by Elyse Jarvis on 05/19/2020  8:07 AM. (History)      Referring physician: Marden Noble, MD [address] [place],  [place] [address]  HISTORICAL INFORMATION:   Selected notes from the MEDICAL RECORD NUMBER    Lab Results  Component Value Date   HGBA1C 5.6 03/14/2020     CURRENT MEDICATIONS: No current outpatient medications on file. (Ophthalmic Drugs)   No current facility-administered medications for this visit. (Ophthalmic Drugs)   Current Outpatient Medications (Other)  Medication Sig  . albuterol (VENTOLIN HFA) 108 (90 Base) MCG/ACT inhaler Inhale 2 puffs into the lungs every 6 (six) hours as needed for wheezing or shortness of breath.  Marland Kitchen amLODipine (NORVASC) 5 MG tablet Take 5 mg by mouth daily.  Marland Kitchen aspirin EC 81 MG tablet Take 81 mg by mouth daily.  Marland Kitchen atorvastatin (LIPITOR) 10 MG tablet Take 1 tablet (10 mg total) by mouth daily.  . cetirizine (ZYRTEC) 10 MG tablet Take 10 mg by mouth daily.  . Cholecalciferol (VITAMIN D) 50 MCG (2000 UT) tablet Take 4,000 Units by mouth daily.   . cloNIDine (CATAPRES) 0.2 MG tablet Take 0.2 mg by mouth every evening.   Marland Kitchen glimepiride (AMARYL) 1 MG tablet Take 1 tablet (1 mg total) by mouth in the morning and at bedtime.  Marland Kitchen  loratadine (CLARITIN) 10 MG tablet Take 10 mg by mouth daily.   Marland Kitchen losartan (COZAAR) 50 MG tablet Take 1 tablet (50 mg total) by mouth daily.  . Magnesium 400 MG CAPS Take 400 mg by mouth daily.   . metFORMIN (GLUCOPHAGE-XR) 500 MG 24 hr tablet Take 1,000 mg by mouth 2 (two) times daily.   . pantoprazole (PROTONIX) 40 MG tablet TAKE 1 TABLET BY MOUTH TWICE DAILY (Patient taking differently: Take 40 mg by mouth 2 (two) times daily.)  . Semaglutide, 1 MG/DOSE, (OZEMPIC, 1 MG/DOSE,) 2 MG/1.5ML SOPN Inject 1 mg into the skin once a week.  . Semaglutide,0.25 or 0.5MG /DOS, (OZEMPIC, 0.25 OR 0.5 MG/DOSE,) 2 MG/1.5ML SOPN 0.5 mg  . Spacer/Aero-Holding Chambers (AEROCHAMBER MV) inhaler Use as instructed (Patient taking differently: as needed. Use as instructed for cough)  . vitamin B-12 (CYANOCOBALAMIN) 500 MCG tablet Take 500 mcg by mouth daily.   . Vitamin D, Ergocalciferol, (DRISDOL) 1.25 MG (50000 UNIT) CAPS capsule Take 1 capsule (50,000 Units total) by mouth every 7 (seven) days.   No current facility-administered medications for this visit. (Other)      REVIEW OF SYSTEMS: ROS    Positive for: Endocrine   Last edited by Elyse Jarvis on 05/19/2020  8:07 AM. (History)       ALLERGIES No Known Allergies  PAST  MEDICAL HISTORY Past Medical History:  Diagnosis Date  . ADD (attention deficit disorder)   . Anginal pain (HCC) 2012  . Arthritis, shoulder region   . B12 deficiency   . Back pain   . Coronary artery disease   . Diabetes (HCC)   . Diabetic retinopathy (HCC)   . GERD (gastroesophageal reflux disease)   . H/O heart artery stent 04/2010  . Hyperlipidemia   . Hypertension   . Joint pain   . Knee pain   . Neuropathy   . NSTEMI (non-ST elevated myocardial infarction) (HCC)   . Obesity   . Osteoarthritis   . Persistent cough    stopped for a year  . Sleep apnea   . Swallowing difficulty   . Swelling of both lower extremities    Past Surgical History:  Procedure  Laterality Date  . ACHILLES TENDON REPAIR Right   . AMPUTATION TOE Right 03/14/2019   Procedure: AMPUTATION TOE, 5th toe;  Surgeon: [name], DPM;  Location: [facility] OR;  Service: Podiatry;  Laterality: Right;  . heart stent  2012   x1  . INCISION AND DRAINAGE Right 03/14/2019   Procedure: INCISION AND DRAINAGE;  Surgeon: [name], DPM;  Location: [facility] OR;  Service: Podiatry;  Laterality: Right;  . SHOULDER ARTHROSCOPY Right   . TEE WITHOUT CARDIOVERSION N/A 03/17/2019   Procedure: TRANSESOPHAGEAL ECHOCARDIOGRAM (TEE);  Surgeon: Chilton Si, MD;  Location: [facility] ENDOSCOPY;  Service: Cardiovascular;  Laterality: N/A;  . TOE SURGERY Right 06/2018   for a hammer toe  . TOTAL SHOULDER ARTHROPLASTY Right 11/19/2019   Procedure: TOTAL SHOULDER ARTHROPLASTY;  Surgeon: [name], MD;  Location: [facility] ORS;  Service: Orthopedics;  Laterality: Right;  . UVULOPALATOPHARYNGOPLASTY      FAMILY HISTORY Family History  Problem Relation Age of Onset  . Cancer Other   . Diabetes Other   . Heart disease Other   . Breast cancer Mother   . Cancer Mother   . Heart disease Father   . Diabetes Father   . Stroke Father   . Leukemia Brother   . Asthma Maternal Uncle     SOCIAL HISTORY Social History   Tobacco Use  . Smoking status: Passive Smoke Exposure - Never Smoker  . Smokeless tobacco: Never Used  . Tobacco comment: Both parents growing up.   Vaping Use  . Vaping Use: Never used  Substance Use Topics  . Alcohol use: Yes    Comment: 4-5 on weekends  . Drug use: No         OPHTHALMIC EXAM:  Base Eye Exam    Visual Acuity (Snellen - Linear)      Right Left   Dist cc 20/20 20/20 -1   Correction: Glasses       Tonometry (Tonopen, 8:09 AM)      Right Left   Pressure 18 14       Pupils      Pupils Dark Light Shape React APD   Right PERRL 3 2 Round Slow None   Left PERRL 3 2 Round Slow None       Visual Fields (Counting fingers)      Left Right    Full Full        Neuro/Psych    Oriented x3: Yes   Mood/Affect: Normal       Dilation    Both eyes: 1.0% Mydriacyl, 2.5% Phenylephrine @ 8:09 AM        Slit Lamp and Fundus Exam  External Exam      Right Left   External Normal Normal       Slit Lamp Exam      Right Left   Lids/Lashes Normal Normal   Conjunctiva/Sclera White and quiet White and quiet   Cornea Clear Clear   Anterior Chamber Deep and quiet Deep and quiet   Iris Round and reactive Round and reactive   Lens 1+ Nuclear sclerosis 1+ Nuclear sclerosis   Anterior Vitreous Normal Normal       Fundus Exam      Right Left   Posterior Vitreous Normal Normal   Disc Normal Normal   C/D Ratio 0.35 0.35   Macula Focal laser scars Focal laser scars   Vessels NPDR-Severe, with no cotton wool NPDR-Severe, with no cotton wool   Periphery Normal Normal          IMAGING AND PROCEDURES  Imaging and Procedures for 05/19/20  OCT, Retina - OU - Both Eyes       Right Eye Quality was good. Scan locations included subfoveal. Central Foveal Thickness: 335. Progression has been stable.   Left Eye Quality was good. Scan locations included subfoveal. Central Foveal Thickness: 319. Progression has improved.   Notes Diabetic CSME with focal leakages temporal OS and inferior to the fovea OS, yet with good acuity in each eye we have the option to observe and will as patient now has Improved blood sugar control, and continues with CPAP, compliance use  CSME OD, temporally       Color Fundus Photography Optos - OU - Both Eyes       Right Eye Progression has improved. Disc findings include normal observations. Macula : microaneurysms.   Left Eye Progression has worsened. Disc findings include normal observations. Macula : microaneurysms.   Notes Completely resolved cotton-wool spots and stability of NPDR at severe stage As compared to last photographic evidence June 2021  This suggest that hypertensive retinopathy is better  controlled.  Nonetheless CSME persists.  OU                ASSESSMENT/PLAN:  Obstructive sleep apnea Continued CPAP use helps prevent nightly hypoxic maculopathy, progression of diabetic retinopathy, patient very compliant with CPAP nightly.  Severe nonproliferative diabetic retinopathy of left eye, with macular edema, associated with type 2 diabetes mellitus (HCC) Minor CSME remains, with good acuity will observe as CBGs and sleep apnea each under control now  Severe nonproliferative diabetic retinopathy of right eye, with macular edema, associated with type 2 diabetes mellitus (HCC) Minor CSME remains, with good acuity will observe as CBGs and sleep apnea each under control now      ICD-10-CM   1. Severe nonproliferative diabetic retinopathy of right eye, with macular edema, associated with type 2 diabetes mellitus (HCC)  E11.3411 OCT, Retina - OU - Both Eyes    Color Fundus Photography Optos - OU - Both Eyes  2. Severe nonproliferative diabetic retinopathy of left eye, with macular edema, associated with type 2 diabetes mellitus (HCC)  E11.3412 OCT, Retina - OU - Both Eyes    Color Fundus Photography Optos - OU - Both Eyes  3. Obstructive sleep apnea  G47.33     1.  We will continue to observe OU, no threat to central visual acuity at this time from minor extra foveal CSME  2.  3.  Ophthalmic Meds Ordered this visit:  No orders of the defined types were placed in this encounter.  Return in about 4 months (around 09/18/2020) for DILATE OU, OCT.  There are no Patient Instructions on file for this visit.   Explained the diagnoses, plan, and follow up with the patient and they expressed understanding.  Patient expressed understanding of the importance of proper follow up care.   Alford HighlandGary A. Renard Caperton M.D. Diseases & Surgery of the Retina and Vitreous [facility] 05/19/20     Abbreviations: M myopia (nearsighted); A astigmatism; H hyperopia  (farsighted); P presbyopia; Mrx spectacle prescription;  CTL contact lenses; OD right eye; OS left eye; OU both eyes  XT exotropia; ET esotropia; PEK punctate epithelial keratitis; PEE punctate epithelial erosions; DES dry eye syndrome; MGD meibomian gland dysfunction; ATs artificial tears; PFAT's preservative free artificial tears; NSC nuclear sclerotic cataract; PSC posterior subcapsular cataract; ERM epi-retinal membrane; PVD posterior vitreous detachment; RD retinal detachment; DM diabetes mellitus; DR diabetic retinopathy; NPDR non-proliferative diabetic retinopathy; PDR proliferative diabetic retinopathy; CSME clinically significant macular edema; DME diabetic macular edema; dbh dot blot hemorrhages; CWS cotton wool spot; POAG primary open angle glaucoma; C/D cup-to-disc ratio; HVF humphrey visual field; GVF goldmann visual field; OCT optical coherence tomography; IOP intraocular pressure; BRVO Branch retinal vein occlusion; CRVO central retinal vein occlusion; CRAO central retinal artery occlusion; BRAO branch retinal artery occlusion; RT retinal tear; SB scleral buckle; PPV pars plana vitrectomy; VH Vitreous hemorrhage; PRP panretinal laser photocoagulation; IVK intravitreal kenalog; VMT vitreomacular traction; MH Macular hole;  NVD neovascularization of the disc; NVE neovascularization elsewhere; AREDS age related eye disease study; ARMD age related macular degeneration; POAG primary open angle glaucoma; EBMD epithelial/anterior basement membrane dystrophy; ACIOL anterior chamber intraocular lens; IOL intraocular lens; PCIOL posterior chamber intraocular lens; Phaco/IOL phacoemulsification with intraocular lens placement; PRK photorefractive keratectomy; LASIK laser assisted in situ keratomileusis; HTN hypertension; DM diabetes mellitus; COPD chronic obstructive pulmonary disease

## 2020-05-19 NOTE — Assessment & Plan Note (Signed)
Continued CPAP use helps prevent nightly hypoxic maculopathy, progression of diabetic retinopathy, patient very compliant with CPAP nightly.

## 2020-06-02 ENCOUNTER — Other Ambulatory Visit (INDEPENDENT_AMBULATORY_CARE_PROVIDER_SITE_OTHER): Payer: Self-pay | Admitting: Family Medicine

## 2020-06-02 DIAGNOSIS — E1169 Type 2 diabetes mellitus with other specified complication: Secondary | ICD-10-CM

## 2020-06-02 DIAGNOSIS — I152 Hypertension secondary to endocrine disorders: Secondary | ICD-10-CM

## 2020-06-02 NOTE — Telephone Encounter (Signed)
My chart message sent to pt.

## 2020-06-06 ENCOUNTER — Ambulatory Visit (INDEPENDENT_AMBULATORY_CARE_PROVIDER_SITE_OTHER): Payer: BC Managed Care – PPO | Admitting: Family Medicine

## 2020-06-06 ENCOUNTER — Encounter (INDEPENDENT_AMBULATORY_CARE_PROVIDER_SITE_OTHER): Payer: Self-pay | Admitting: Family Medicine

## 2020-06-06 ENCOUNTER — Other Ambulatory Visit: Payer: Self-pay

## 2020-06-06 VITALS — BP 139/78 | HR 86 | Temp 97.7°F | Ht 71.0 in | Wt 250.0 lb

## 2020-06-06 DIAGNOSIS — E1169 Type 2 diabetes mellitus with other specified complication: Secondary | ICD-10-CM | POA: Diagnosis not present

## 2020-06-06 DIAGNOSIS — Z9189 Other specified personal risk factors, not elsewhere classified: Secondary | ICD-10-CM | POA: Diagnosis not present

## 2020-06-06 DIAGNOSIS — I152 Hypertension secondary to endocrine disorders: Secondary | ICD-10-CM

## 2020-06-06 DIAGNOSIS — E1159 Type 2 diabetes mellitus with other circulatory complications: Secondary | ICD-10-CM | POA: Diagnosis not present

## 2020-06-06 DIAGNOSIS — E559 Vitamin D deficiency, unspecified: Secondary | ICD-10-CM

## 2020-06-06 DIAGNOSIS — Z6841 Body Mass Index (BMI) 40.0 and over, adult: Secondary | ICD-10-CM

## 2020-06-06 DIAGNOSIS — E66813 Obesity, class 3: Secondary | ICD-10-CM

## 2020-06-06 MED ORDER — GLIMEPIRIDE 1 MG PO TABS: 1.0000 mg | ORAL_TABLET | Freq: Two times a day (BID) | ORAL | 0 refills | Status: DC

## 2020-06-06 MED ORDER — LOSARTAN POTASSIUM 50 MG PO TABS
50.0000 mg | ORAL_TABLET | Freq: Every day | ORAL | 0 refills | Status: DC
Start: 2020-06-06 — End: 2020-07-04

## 2020-06-06 MED ORDER — VITAMIN D (ERGOCALCIFEROL) 1.25 MG (50000 UNIT) PO CAPS: 50000.0000 [IU] | ORAL_CAPSULE | ORAL | 0 refills | Status: DC

## 2020-06-06 NOTE — Patient Instructions (Signed)
.  dof

## 2020-06-08 NOTE — Progress Notes (Signed)
Chief Complaint:   OBESITY [name] is here to discuss his progress with his obesity treatment plan along with follow-up of his obesity related diagnoses.   Today's visit was #: 16 Starting weight: 310 lbs Starting date: 09/01/2019 Today's weight: 250 lbs Today's date: 06/06/2020 Total lbs lost to date: 60 lbs Body mass index is 34.87 kg/m.  Total weight loss percentage to date: -19.35%  Interim History:  [name] is here for a follow up office visit and he is following the meal plan without concerns or issues.  Patient's meal and food recall appears to be accurate and consistent with what is on the plan.  When on plan, his hunger and cravings are well controlled.    Current Meal Plan: the Category 1 Plan for 50% of the time.  Current Exercise Plan: None. Current Anti-Obesity Medications: Ozempic 1 mg subcutaneously weekly. Side effects: None.  Assessment/Plan:   1. Type 2 diabetes mellitus with other specified complication, without long-term current use of insulin (HCC) Diabetes Mellitus: At goal. Medication: Amaryl 1 mg twice daily, metformin 1,000 mg twice daily, Ozempic 1 mg subcutaneously weekly. Issues reviewed: blood sugar goals, complications of diabetes mellitus, hypoglycemia prevention and treatment, exercise, and nutrition. FBS 120s mostly.  Lows 108, highs 130-140.  Plan: The importance of regular follow up with PCP and all other specialists as scheduled was stressed to patient today.  Will check A1c at next office visit.   Lab Results  Component Value Date   HGBA1C 5.6 03/14/2020   HGBA1C 5.4 11/17/2019   HGBA1C 6.6 (H) 09/01/2019   Lab Results  Component Value Date   LDLCALC 69 02/01/2020   CREATININE 1.20 03/14/2020   - Refill glimepiride (AMARYL) 1 MG tablet; Take 1 tablet (1 mg total) by mouth in the morning and at bedtime.  Dispense: 60 tablet; Refill: 0 - Hemoglobin A1c  2. Hypertension associated with type 2 diabetes mellitus  (HCC) Medications: Norvasc 5 mg daily, clonidine 0.2 mg twice daily, losartan 50 mg daiy.   Plan:  Blood pressure essentially at goal.  Avoid buying foods that are: processed, frozen, or prepackaged to avoid excess salt. We will continue to monitor closely alongside his PCP and/or Specialist.  Regular follow up with PCP and specialists was also encouraged.   BP Readings from Last 3 Encounters:  06/06/20 139/78  05/09/20 (!) 150/79  04/11/20 (!) 152/78   Lab Results  Component Value Date   CREATININE 1.20 03/14/2020   - Refill losartan (COZAAR) 50 MG tablet; Take 1 tablet (50 mg total) by mouth daily.  Dispense: 30 tablet; Refill: 0  3. Vitamin D deficiency At goal. Current vitamin D is 75.1, tested on 03/14/2020. Optimal goal > 50 ng/dL. He is taking vitamin D 50,000 IU weekly.  Plan: Continue to take prescription Vitamin D @50 ,000 IU every week as prescribed.  Will check vitamin D level at next office visit.  - Refill Vitamin D, Ergocalciferol, (DRISDOL) 1.25 MG (50000 UNIT) CAPS capsule; Take 1 capsule (50,000 Units total) by mouth every 7 (seven) days.  Dispense: 4 capsule; Refill: 0 - VITAMIN D 25 Hydroxy (Vit-D Deficiency, Fractures)  4. At risk for hypoglycemia [name] was given approximately 9 minutes of counseling today regarding prevention of hypoglycemia.  He was advised of symptoms of hypoglycemia.  [name] was instructed to avoid skipping meals and to eat regular protein-rich meals as prescribed on the meal plan.  The patient should have readily available low calorie snacks as needed, such  as [employer] fruit snack packs, if blood sugar goes too low.  He is susceptible to hypoglycemia as he continues to lose weight.   5. Obesity, current BMI 34.9  Course: [name] is currently in the action stage of change. As such, his goal is to continue with weight loss efforts.   Nutrition goals: He has agreed to the Category 1 Plan.   Exercise goals: As is.  Behavioral modification  strategies: increasing lean protein intake, decreasing simple carbohydrates, meal planning and cooking strategies and planning for success.  [name] has agreed to follow-up with our clinic in 2-3 weeks. He was informed of the importance of frequent follow-up visits to maximize his success with intensive lifestyle modifications for his multiple health conditions.   Objective:   Blood pressure 139/78, pulse 86, temperature 97.7 F (36.5 C), height 5\' 11"  (1.803 m), weight 250 lb (113.4 kg), SpO2 97 %. Body mass index is 34.87 kg/m.  General: Cooperative, alert, well developed, in no acute distress. HEENT: Conjunctivae and lids unremarkable. Cardiovascular: Regular rhythm.  Lungs: Normal work of breathing. Neurologic: No focal deficits.   Lab Results  Component Value Date   CREATININE 1.20 03/14/2020   BUN 23 03/14/2020   NA 141 03/14/2020   K 4.9 03/14/2020   CL 101 03/14/2020   CO2 26 03/14/2020   Lab Results  Component Value Date   ALT 28 03/14/2020   AST 20 03/14/2020   ALKPHOS 80 03/14/2020   BILITOT 0.7 03/14/2020   Lab Results  Component Value Date   HGBA1C 5.6 03/14/2020   HGBA1C 5.4 11/17/2019   HGBA1C 6.6 (H) 09/01/2019   HGBA1C 8.8 (H) 03/13/2019   HGBA1C (H) 04/26/2010    11.9 (NOTE)                                                                       According to the ADA Clinical Practice Recommendations for 2011, when HbA1c is used as a screening test:   >=6.5%   Diagnostic of Diabetes Mellitus           (if abnormal result  is confirmed)  5.7-6.4%   Increased risk of developing Diabetes Mellitus  References:Diagnosis and Classification of Diabetes Mellitus,Diabetes Care,2011,34(Suppl 1):S62-S69 and Standards of Medical Care in         Diabetes - 2011,Diabetes Care,2011,34  (Suppl 1):S11-S61.   Lab Results  Component Value Date   INSULIN 28.9 (H) 09/01/2019   Lab Results  Component Value Date   TSH 3.220 09/01/2019   Lab Results  Component Value Date    CHOL 132 02/01/2020   HDL 46 02/01/2020   LDLCALC 69 02/01/2020   TRIG 92 02/01/2020   CHOLHDL 2.9 02/01/2020   Lab Results  Component Value Date   WBC 7.3 11/17/2019   HGB 14.2 11/17/2019   HCT 42.3 11/17/2019   MCV 89.4 11/17/2019   PLT 284 11/17/2019   Attestation Statements:   Reviewed by clinician on day of visit: allergies, medications, problem list, medical history, surgical history, family history, social history, and previous encounter notes.  I, 01/17/2020, CMA, am acting as Insurance claims handler for Energy manager, DO.  I have reviewed the above documentation for accuracy and completeness, and I agree with the above. -  Marjory Sneddon, D.O.  The [place] was signed into law in 2016 which includes the topic of electronic health records.  This provides immediate access to information in MyChart.  This includes consultation notes, operative notes, office notes, lab results and pathology reports.  If you have any questions about what you read please let us know at your next visit so we can discuss your concerns and take corrective action if need be.  We are right here with you.

## 2020-06-20 ENCOUNTER — Encounter (INDEPENDENT_AMBULATORY_CARE_PROVIDER_SITE_OTHER): Payer: BC Managed Care – PPO | Admitting: Ophthalmology

## 2020-07-04 ENCOUNTER — Ambulatory Visit (INDEPENDENT_AMBULATORY_CARE_PROVIDER_SITE_OTHER): Payer: BC Managed Care – PPO | Admitting: Family Medicine

## 2020-07-04 ENCOUNTER — Encounter (INDEPENDENT_AMBULATORY_CARE_PROVIDER_SITE_OTHER): Payer: Self-pay | Admitting: Family Medicine

## 2020-07-04 ENCOUNTER — Other Ambulatory Visit: Payer: Self-pay

## 2020-07-04 VITALS — BP 118/76 | HR 69 | Temp 97.4°F | Ht 71.0 in | Wt 251.0 lb

## 2020-07-04 DIAGNOSIS — E1169 Type 2 diabetes mellitus with other specified complication: Secondary | ICD-10-CM | POA: Diagnosis not present

## 2020-07-04 DIAGNOSIS — F5089 Other specified eating disorder: Secondary | ICD-10-CM | POA: Diagnosis not present

## 2020-07-04 DIAGNOSIS — E1159 Type 2 diabetes mellitus with other circulatory complications: Secondary | ICD-10-CM | POA: Diagnosis not present

## 2020-07-04 DIAGNOSIS — F509 Eating disorder, unspecified: Secondary | ICD-10-CM | POA: Insufficient documentation

## 2020-07-04 DIAGNOSIS — I152 Hypertension secondary to endocrine disorders: Secondary | ICD-10-CM

## 2020-07-04 DIAGNOSIS — Z9189 Other specified personal risk factors, not elsewhere classified: Secondary | ICD-10-CM

## 2020-07-04 DIAGNOSIS — Z6841 Body Mass Index (BMI) 40.0 and over, adult: Secondary | ICD-10-CM

## 2020-07-04 DIAGNOSIS — E559 Vitamin D deficiency, unspecified: Secondary | ICD-10-CM

## 2020-07-04 MED ORDER — LOSARTAN POTASSIUM 50 MG PO TABS: 50.0000 mg | ORAL_TABLET | Freq: Every day | ORAL | 0 refills | Status: DC

## 2020-07-04 MED ORDER — VITAMIN D (ERGOCALCIFEROL) 1.25 MG (50000 UNIT) PO CAPS: 50000.0000 [IU] | ORAL_CAPSULE | ORAL | 0 refills | Status: DC

## 2020-07-04 MED ORDER — OZEMPIC (1 MG/DOSE) 2 MG/1.5ML ~~LOC~~ SOPN: 1.0000 mg | PEN_INJECTOR | SUBCUTANEOUS | 0 refills | Status: DC

## 2020-07-04 MED ORDER — GLIMEPIRIDE 1 MG PO TABS: 1.0000 mg | ORAL_TABLET | Freq: Two times a day (BID) | ORAL | 0 refills | Status: DC

## 2020-07-05 LAB — VITAMIN D 25 HYDROXY (VIT D DEFICIENCY, FRACTURES): Vit D, 25-Hydroxy: 58.2 ng/mL (ref 30.0–100.0)

## 2020-07-05 LAB — HEMOGLOBIN A1C
Est. average glucose Bld gHb Est-mCnc: 126 mg/dL
Hgb A1c MFr Bld: 6 % — ABNORMAL HIGH (ref 4.8–5.6)

## 2020-07-06 NOTE — Progress Notes (Signed)
Chief Complaint:   OBESITY [name] is here to discuss his progress with his obesity treatment plan along with follow-up of his obesity related diagnoses.   Today's visit was #: 17 Starting weight: 310 lbs Starting date: 09/01/2019 Today's weight: 251 lbs Today's date: 07/04/2020 Weight change since last visit: +1 lb Total lbs lost to date: 59 lbs Body mass index is 35.01 kg/m.  Total weight loss percentage to date: -19.03%  Interim History:  [name] had no issues with the plan.  [name] went to a wedding over the weekend and ate and drank off plan a lot.  [name] says [name] is having more "snacking episodes" and [name] knows his blood sugars the next day are terrible (>200s from time to time).  [name] saw his PCP on February 25.  No labs at that time.  Current Meal Plan: the Category 1 Plan for 50% of the time.  Current Exercise Plan: None. Current Anti-Obesity Medications: Ozempic 1 mg subcutaneously weekly. Side effects: None.  Assessment/Plan:   Medications Discontinued During This Encounter  Medication Reason  . Semaglutide,0.25 or 0.5MG /DOS, (OZEMPIC, 0.25 OR 0.5 MG/DOSE,) 2 MG/1.5ML SOPN   . Semaglutide, 1 MG/DOSE, (OZEMPIC, 1 MG/DOSE,) 2 MG/1.5ML SOPN Reorder  . losartan (COZAAR) 50 MG tablet Reorder  . Vitamin D, Ergocalciferol, (DRISDOL) 1.25 MG (50000 UNIT) CAPS capsule Reorder  . glimepiride (AMARYL) 1 MG tablet Reorder    Meds ordered this encounter  Medications  . glimepiride (AMARYL) 1 MG tablet    Sig: Take 1 tablet (1 mg total) by mouth in the morning and at bedtime.    Dispense:  60 tablet    Refill:  0  . losartan (COZAAR) 50 MG tablet    Sig: Take 1 tablet (50 mg total) by mouth daily.    Dispense:  30 tablet    Refill:  0  . Vitamin D, Ergocalciferol, (DRISDOL) 1.25 MG (50000 UNIT) CAPS capsule    Sig: Take 1 capsule (50,000 Units total) by mouth every 7 (seven) days.    Dispense:  4 capsule    Refill:  0  . Semaglutide, 1 MG/DOSE, (OZEMPIC, 1 MG/DOSE,) 2 MG/1.5ML  SOPN    Sig: Inject 1 mg into the skin once a week.    Dispense:  9 mL    Refill:  0    1. Type 2 diabetes mellitus with other specified complication, without long-term current use of insulin (HCC) Diabetes Mellitus: Not at goal. Medication: glimepiride 1 mg twice daily, metformin 1,000 mg twice daily, Ozempic 1 mg subcutaneously weekly. Issues reviewed: blood sugar goals, complications of diabetes mellitus, hypoglycemia prevention and treatment, exercise, and nutrition.  FBS 100-140.  A1c around 3-4 months ago was 5.6.  Plan:  Will check A1c today.  Will refill glimepiride and Ozempic.  The importance of regular follow up with PCP and all other specialists as scheduled was stressed to patient today. The patient will continue to focus on protein-rich, low simple carbohydrate foods. We reviewed the importance of hydration, regular exercise for stress reduction, and restorative sleep.   Lab Results  Component Value Date   HGBA1C 6.0 (H) 07/04/2020   HGBA1C 5.6 03/14/2020   HGBA1C 5.4 11/17/2019   Lab Results  Component Value Date   LDLCALC 69 02/01/2020   CREATININE 1.20 03/14/2020   - Refill glimepiride (AMARYL) 1 MG tablet; Take 1 tablet (1 mg total) by mouth in the morning and at bedtime.  Dispense: 60 tablet; Refill: 0 - Refill Semaglutide, 1  MG/DOSE, (OZEMPIC, 1 MG/DOSE,) 2 MG/1.5ML SOPN; Inject 1 mg into the skin once a week.  Dispense: 9 mL; Refill: 0  2. Hypertension associated with type 2 diabetes mellitus (HCC) Not at goal. Medications: Norvasc 5 mg daily, clonidine 0.2 mg daily, Cozaar 50 mg daily.    Plan:  Will refill losartan.  Blood pressure at goal.  Avoid buying foods that are: processed, frozen, or prepackaged to avoid excess salt. We will watch for signs of hypotension as [name] continues lifestyle modifications. We will continue to monitor closely alongside his PCP and/or Specialist.  Regular follow up with PCP and specialists was also encouraged.   BP Readings from Last 3  Encounters:  07/04/20 118/76  06/06/20 139/78  05/09/20 (!) 150/79   Lab Results  Component Value Date   CREATININE 1.20 03/14/2020   - Refill losartan (COZAAR) 50 MG tablet; Take 1 tablet (50 mg total) by mouth daily.  Dispense: 30 tablet; Refill: 0  3. Vitamin D deficiency At goal. Current vitamin D is 75.1, tested on 03/14/2020. Optimal goal > 50 ng/dL.  She is taking vitamin D 50,000 IU weekly.  Plan: Continue to take prescription Vitamin D @50 ,000 IU every week as prescribed.  Follow-up for routine testing of Vitamin D, at least 2-3 times per year to avoid over-replacement.  - Refill Vitamin D, Ergocalciferol, (DRISDOL) 1.25 MG (50000 UNIT) CAPS capsule; Take 1 capsule (50,000 Units total) by mouth every 7 (seven) days.  Dispense: 4 capsule; Refill: 0  4. Other disorder of eating, with emotional eating Improving, but not optimized. Medication: None.  Plan:  Declines Psychiatry or [facility] referrals.  Long discussion with patient regarding emotional eating strategies and handouts given. Discussed cues and consequences, how thoughts affect eating, model of thoughts, feelings, and behaviors, and strategies for change by focusing on the cue. Discussed cognitive distortions, coping thoughts, and how to change your thoughts.  5. At risk for deficient intake of food [name] was given extensive education and counseling today of more than 8 minutes on risks associated with deficient food intake.  Counseled him on the importance of following our prescribed meal plan and eating adequate amounts of protein.  Discussed with [name] that inadequate food intake over longer periods of time can slow [name] metabolism down significantly.   6. Obesity, current BMI 35.1  Course: [name] is currently in the action stage of change. As such, his goal is to continue with weight loss efforts.   Nutrition goals: [name] has agreed to the Category 1 Plan.   Exercise goals: Started walking for 30 minutes 3 days  per week.  Behavioral modification strategies: better snacking choices, emotional eating strategies and avoiding temptations.  [name] has agreed to follow-up with our clinic in 3-4 weeks. [name] was informed of the importance of frequent follow-up visits to maximize his success with intensive lifestyle modifications for his multiple health conditions.   [name] was informed we would discuss his lab results at his next visit unless there is a critical issue that needs to be addressed sooner. [name] agreed to keep his next visit at the agreed upon time to discuss these results.  Objective:   Blood pressure 118/76, pulse 69, temperature (!) 97.4 F (36.3 C), height 5\' 11"  (1.803 m), weight 251 lb (113.9 kg), SpO2 99 %. Body mass index is 35.01 kg/m.  General: Cooperative, alert, well developed, in no acute distress. HEENT: Conjunctivae and lids unremarkable. Cardiovascular: Regular rhythm.  Lungs: Normal work of breathing. Neurologic: No focal  deficits.   Lab Results  Component Value Date   CREATININE 1.20 03/14/2020   BUN 23 03/14/2020   NA 141 03/14/2020   K 4.9 03/14/2020   CL 101 03/14/2020   CO2 26 03/14/2020   Lab Results  Component Value Date   ALT 28 03/14/2020   AST 20 03/14/2020   ALKPHOS 80 03/14/2020   BILITOT 0.7 03/14/2020   Lab Results  Component Value Date   HGBA1C 6.0 (H) 07/04/2020   HGBA1C 5.6 03/14/2020   HGBA1C 5.4 11/17/2019   HGBA1C 6.6 (H) 09/01/2019   HGBA1C 8.8 (H) 03/13/2019   Lab Results  Component Value Date   INSULIN 28.9 (H) 09/01/2019   Lab Results  Component Value Date   TSH 3.220 09/01/2019   Lab Results  Component Value Date   CHOL 132 02/01/2020   HDL 46 02/01/2020   LDLCALC 69 02/01/2020   TRIG 92 02/01/2020   CHOLHDL 2.9 02/01/2020   Lab Results  Component Value Date   WBC 7.3 11/17/2019   HGB 14.2 11/17/2019   HCT 42.3 11/17/2019   MCV 89.4 11/17/2019   PLT 284 11/17/2019   Attestation Statements:   Reviewed by  clinician on day of visit: allergies, medications, problem list, medical history, surgical history, family history, social history, and previous encounter notes.  I, Insurance claims handler, CMA, am acting as Energy manager for [employer], DO.  I have reviewed the above documentation for accuracy and completeness, and I agree with the above. Carlye Grippe, D.O.  The 21st Century Cures Act was signed into law in 2016 which includes the topic of electronic health records.  This provides immediate access to information in MyChart.  This includes consultation notes, operative notes, office notes, lab results and pathology reports.  If you have any questions about what you read please let us know at your next visit so we can discuss your concerns and take corrective action if need be.  We are right here with you.

## 2020-07-13 DIAGNOSIS — E119 Type 2 diabetes mellitus without complications: Secondary | ICD-10-CM | POA: Diagnosis not present

## 2020-07-13 DIAGNOSIS — E11319 Type 2 diabetes mellitus with unspecified diabetic retinopathy without macular edema: Secondary | ICD-10-CM | POA: Diagnosis not present

## 2020-07-13 DIAGNOSIS — H5213 Myopia, bilateral: Secondary | ICD-10-CM | POA: Diagnosis not present

## 2020-08-01 ENCOUNTER — Other Ambulatory Visit: Payer: Self-pay

## 2020-08-01 ENCOUNTER — Ambulatory Visit (INDEPENDENT_AMBULATORY_CARE_PROVIDER_SITE_OTHER): Payer: BC Managed Care – PPO | Admitting: Family Medicine

## 2020-08-01 VITALS — BP 124/73 | HR 79 | Temp 97.5°F | Ht 71.0 in | Wt 257.0 lb

## 2020-08-01 DIAGNOSIS — E559 Vitamin D deficiency, unspecified: Secondary | ICD-10-CM | POA: Diagnosis not present

## 2020-08-01 DIAGNOSIS — E1159 Type 2 diabetes mellitus with other circulatory complications: Secondary | ICD-10-CM | POA: Diagnosis not present

## 2020-08-01 DIAGNOSIS — I152 Hypertension secondary to endocrine disorders: Secondary | ICD-10-CM

## 2020-08-01 DIAGNOSIS — Z6841 Body Mass Index (BMI) 40.0 and over, adult: Secondary | ICD-10-CM

## 2020-08-01 DIAGNOSIS — E1169 Type 2 diabetes mellitus with other specified complication: Secondary | ICD-10-CM | POA: Diagnosis not present

## 2020-08-01 DIAGNOSIS — Z9189 Other specified personal risk factors, not elsewhere classified: Secondary | ICD-10-CM | POA: Diagnosis not present

## 2020-08-01 MED ORDER — GLIMEPIRIDE 1 MG PO TABS: 1.0000 mg | ORAL_TABLET | Freq: Two times a day (BID) | ORAL | 0 refills | Status: DC

## 2020-08-01 MED ORDER — OZEMPIC (1 MG/DOSE) 2 MG/1.5ML ~~LOC~~ SOPN
2.0000 mg | PEN_INJECTOR | SUBCUTANEOUS | 0 refills | Status: DC
Start: 2020-08-01 — End: 2020-08-30

## 2020-08-01 MED ORDER — LOSARTAN POTASSIUM 50 MG PO TABS: 50.0000 mg | ORAL_TABLET | Freq: Every day | ORAL | 0 refills | Status: DC

## 2020-08-01 MED ORDER — VITAMIN D (ERGOCALCIFEROL) 1.25 MG (50000 UNIT) PO CAPS: 50000.0000 [IU] | ORAL_CAPSULE | ORAL | 0 refills | Status: DC

## 2020-08-08 NOTE — Progress Notes (Signed)
Chief Complaint:   OBESITY [name] is here to discuss his progress with his obesity treatment plan along with follow-up of his obesity related diagnoses.   Today's visit was #: 18 Starting weight: 310 lbs Starting date: 09/01/2019 Today's weight: 257 lbs Today's date: 08/01/2020 Weight change since last visit: +6 lbs Total lbs lost to date: 53 lbs Body mass index is 35.84 kg/m.  Total weight loss percentage to date: -17.10%  Interim History:  [name] has been eating what he wants lately.  Blood sugars have been "all over the place".  He is disappointed with himself and states this is the most he has ever gained while we have been working together.    Plan:  Get back to the gym/walk outside for 3 days per week.  Decrease salt intake, increase water intake.  Stick to meal plan.  Current Meal Plan: the Category 1 Plan for 60% of the time.  Current Exercise Plan: None. Current Anti-Obesity Medications: Ozempic 2 mg subcutaneously weekly. Side effects: None.  Assessment/Plan:   Medications Discontinued During This Encounter  Medication Reason   glimepiride (AMARYL) 1 MG tablet Reorder   losartan (COZAAR) 50 MG tablet Reorder   Vitamin D, Ergocalciferol, (DRISDOL) 1.25 MG (50000 UNIT) CAPS capsule Reorder   Semaglutide, 1 MG/DOSE, (OZEMPIC, 1 MG/DOSE,) 2 MG/1.5ML SOPN Reorder   Meds ordered this encounter  Medications   glimepiride (AMARYL) 1 MG tablet    Sig: Take 1 tablet (1 mg total) by mouth in the morning and at bedtime.    Dispense:  60 tablet    Refill:  0   losartan (COZAAR) 50 MG tablet    Sig: Take 1 tablet (50 mg total) by mouth daily.    Dispense:  30 tablet    Refill:  0   Vitamin D, Ergocalciferol, (DRISDOL) 1.25 MG (50000 UNIT) CAPS capsule    Sig: Take 1 capsule (50,000 Units total) by mouth every 7 (seven) days.    Dispense:  4 capsule    Refill:  0   Semaglutide, 1 MG/DOSE, (OZEMPIC, 1 MG/DOSE,) 2 MG/1.5ML SOPN    Sig: Inject 2 mg into the skin once a  week.    Dispense:  6 mL    Refill:  0    1. Type 2 diabetes mellitus with other specified complication, without long-term current use of insulin (HCC) Diabetes Mellitus: Not at goal. Medication: glimepiride 1 mg twice daily, metformin 1,000 mg twice daily. Issues reviewed: blood sugar goals, complications of diabetes mellitus, hypoglycemia prevention and treatment, exercise, and nutrition.   FBS 213 on days he eats poorly.  On days he eats well, FBS 106 the next morning.  A1c increased from 5.6 to 6.0.  Plan:  Discussed labs with patient today.  Increase Ozempic to 2 mg weekly (up from 1 mg).  Continue metformin.  Refill glimepiride. The importance of regular follow up with PCP and all other specialists as scheduled was stressed to patient today. The patient will continue to focus on protein-rich, low simple carbohydrate foods. We reviewed the importance of hydration, regular exercise for stress reduction, and restorative sleep.   Lab Results  Component Value Date   HGBA1C 6.0 (H) 07/04/2020   HGBA1C 5.6 03/14/2020   HGBA1C 5.4 11/17/2019   Lab Results  Component Value Date   LDLCALC 69 02/01/2020   CREATININE 1.20 03/14/2020   - Refill glimepiride (AMARYL) 1 MG tablet; Take 1 tablet (1 mg total) by mouth in the morning and at  bedtime.  Dispense: 60 tablet; Refill: 0 - Increase and refill semaglutide, 1 MG/DOSE, (OZEMPIC, 1 MG/DOSE,) 2 MG/1.5ML SOPN; Inject 2 mg into the skin once a week.  Dispense: 6 mL; Refill: 0  2. Hypertension associated with type 2 diabetes mellitus (HCC) At goal. Medications: Norvasc 5 mg daily, clonidine 0.2 mg at night, losartan 50 mg daily.    Plan:  Discussed labs with patient today. Refill losartan.  Stable and at goal.  Avoid buying foods that are: processed, frozen, or prepackaged to avoid excess salt. We will watch for signs of hypotension as he continues lifestyle modifications. We will continue to monitor closely alongside his PCP and/or Specialist.   Regular follow up with PCP and specialists was also encouraged.   BP Readings from Last 3 Encounters:  08/01/20 124/73  07/04/20 118/76  06/06/20 139/78   Lab Results  Component Value Date   CREATININE 1.20 03/14/2020   - Refill losartan (COZAAR) 50 MG tablet; Take 1 tablet (50 mg total) by mouth daily.  Dispense: 30 tablet; Refill: 0  3. Vitamin D deficiency At goal. Current vitamin D is 58.2, tested on 07/04/2020. Optimal goal > 50 ng/dL.  He is taking prescription vitamin D 50,000 IU weekly.  Plan:  Discussed labs with patient today.  Continue to take prescription Vitamin D @50 ,000 IU every week as prescribed.  Follow-up for routine testing of Vitamin D, at least 2-3 times per year to avoid over-replacement.  - Refill Vitamin D, Ergocalciferol, (DRISDOL) 1.25 MG (50000 UNIT) CAPS capsule; Take 1 capsule (50,000 Units total) by mouth every 7 (seven) days.  Dispense: 4 capsule; Refill: 0  4. At risk for hyperglycemia [name] was given approximately 22 minutes of counseling today regarding detriments to health with hyperglycemia.  He was advised of symptoms of hyperglycemia.  Trevar was instructed to avoid skipping proteins in meals and eating only carb-rich foods.  The patient should be checking FBS, 2 hr PP BS's and also any time they do not feel well- especially with new onset nausea/ vomiting and abdominal pain, excessive thirst or hunger, tachycardia etc.  Importance of regular follow-ups with their PCP or Endocrinologist, in addition to our visits was stressed to patient.   5. Obesity BMI today is 35  Course: [name] is currently in the action stage of change. As such, his goal is to continue with weight loss efforts.   Nutrition goals: He has agreed to the Category 1 Plan.   Exercise goals: For substantial health benefits, adults should do at least 150 minutes (2 hours and 30 minutes) a week of moderate-intensity, or 75 minutes (1 hour and 15 minutes) a week of vigorous-intensity  aerobic physical activity, or an equivalent combination of moderate- and vigorous-intensity aerobic activity. Aerobic activity should be performed in episodes of at least 10 minutes, and preferably, it should be spread throughout the week.  Behavioral modification strategies: increasing lean protein intake, decreasing simple carbohydrates, increasing water intake, decreasing eating out, avoiding temptations, and planning for success.  Eris has agreed to follow-up with our clinic in 3 weeks. He was informed of the importance of frequent follow-up visits to maximize his success with intensive lifestyle modifications for his multiple health conditions.   Objective:   Blood pressure 124/73, pulse 79, temperature (!) 97.5 F (36.4 C), height 5\' 11"  (1.803 m), weight 257 lb (116.6 kg), SpO2 98 %. Body mass index is 35.84 kg/m.  General: Cooperative, alert, well developed, in no acute distress. HEENT: Conjunctivae and lids unremarkable. Cardiovascular:  Regular rhythm.  Lungs: Normal work of breathing. Neurologic: No focal deficits.   Lab Results  Component Value Date   CREATININE 1.20 03/14/2020   BUN 23 03/14/2020   NA 141 03/14/2020   K 4.9 03/14/2020   CL 101 03/14/2020   CO2 26 03/14/2020   Lab Results  Component Value Date   ALT 28 03/14/2020   AST 20 03/14/2020   ALKPHOS 80 03/14/2020   BILITOT 0.7 03/14/2020   Lab Results  Component Value Date   HGBA1C 6.0 (H) 07/04/2020   HGBA1C 5.6 03/14/2020   HGBA1C 5.4 11/17/2019   HGBA1C 6.6 (H) 09/01/2019   HGBA1C 8.8 (H) 03/13/2019   Lab Results  Component Value Date   INSULIN 28.9 (H) 09/01/2019   Lab Results  Component Value Date   TSH 3.220 09/01/2019   Lab Results  Component Value Date   CHOL 132 02/01/2020   HDL 46 02/01/2020   LDLCALC 69 02/01/2020   TRIG 92 02/01/2020   CHOLHDL 2.9 02/01/2020   Lab Results  Component Value Date   WBC 7.3 11/17/2019   HGB 14.2 11/17/2019   HCT 42.3 11/17/2019   MCV 89.4  11/17/2019   PLT 284 11/17/2019   Attestation Statements:   Reviewed by clinician on day of visit: allergies, medications, problem list, medical history, surgical history, family history, social history, and previous encounter notes.  I, Insurance claims handler, CMA, am acting as Energy manager for [employer], DO.  I have reviewed the above documentation for accuracy and completeness, and I agree with the above. Carlye Grippe, D.O.  The 21st Century Cures Act was signed into law in 2016 which includes the topic of electronic health records.  This provides immediate access to information in MyChart.  This includes consultation notes, operative notes, office notes, lab results and pathology reports.  If you have any questions about what you read please let us know at your next visit so we can discuss your concerns and take corrective action if need be.  We are right here with you.

## 2020-08-22 ENCOUNTER — Ambulatory Visit (INDEPENDENT_AMBULATORY_CARE_PROVIDER_SITE_OTHER): Payer: BC Managed Care – PPO | Admitting: Family Medicine

## 2020-08-30 ENCOUNTER — Encounter (INDEPENDENT_AMBULATORY_CARE_PROVIDER_SITE_OTHER): Payer: Self-pay | Admitting: Family Medicine

## 2020-08-30 ENCOUNTER — Other Ambulatory Visit: Payer: Self-pay

## 2020-08-30 ENCOUNTER — Ambulatory Visit (INDEPENDENT_AMBULATORY_CARE_PROVIDER_SITE_OTHER): Payer: BC Managed Care – PPO | Admitting: Family Medicine

## 2020-08-30 VITALS — BP 116/73 | HR 83 | Temp 97.9°F | Ht 71.0 in | Wt 255.0 lb

## 2020-08-30 DIAGNOSIS — I152 Hypertension secondary to endocrine disorders: Secondary | ICD-10-CM

## 2020-08-30 DIAGNOSIS — E559 Vitamin D deficiency, unspecified: Secondary | ICD-10-CM | POA: Diagnosis not present

## 2020-08-30 DIAGNOSIS — Z9189 Other specified personal risk factors, not elsewhere classified: Secondary | ICD-10-CM

## 2020-08-30 DIAGNOSIS — E1159 Type 2 diabetes mellitus with other circulatory complications: Secondary | ICD-10-CM | POA: Diagnosis not present

## 2020-08-30 DIAGNOSIS — E1169 Type 2 diabetes mellitus with other specified complication: Secondary | ICD-10-CM | POA: Diagnosis not present

## 2020-08-30 DIAGNOSIS — Z6841 Body Mass Index (BMI) 40.0 and over, adult: Secondary | ICD-10-CM

## 2020-08-30 MED ORDER — LOSARTAN POTASSIUM 50 MG PO TABS: 50.0000 mg | ORAL_TABLET | Freq: Every day | ORAL | 0 refills | Status: DC

## 2020-08-30 MED ORDER — GLIMEPIRIDE 1 MG PO TABS: 1.0000 mg | ORAL_TABLET | Freq: Two times a day (BID) | ORAL | 0 refills | Status: DC

## 2020-08-30 MED ORDER — SEMAGLUTIDE (2 MG/DOSE) 8 MG/3ML ~~LOC~~ SOPN: 2.0000 mg | PEN_INJECTOR | SUBCUTANEOUS | 0 refills | Status: DC

## 2020-08-30 MED ORDER — VITAMIN D (ERGOCALCIFEROL) 1.25 MG (50000 UNIT) PO CAPS: 50000.0000 [IU] | ORAL_CAPSULE | ORAL | 0 refills | Status: DC

## 2020-09-05 DIAGNOSIS — G4733 Obstructive sleep apnea (adult) (pediatric): Secondary | ICD-10-CM | POA: Diagnosis not present

## 2020-09-08 NOTE — Progress Notes (Signed)
Chief Complaint:   OBESITY [name] is here to discuss his progress with his obesity treatment plan along with follow-up of his obesity related diagnoses.   Today's visit was #: 19 Starting weight: 310 lbs Starting date: 09/01/2019 Today's weight: 255 lbs Today's date: 08/30/2020 Weight change since last visit: 2 lbs Total lbs lost to date: 55 lbs Body mass index is 35.57 kg/m.  Total weight loss percentage to date: -17.74%  Interim History:  Came back from vacation in [place] and also had a wedding over the weekend.  He is happy he lost weight.  This past week he really focused on his meal plan.  Restarted at the gym 2 days per week as well.  No issues with plan and, "I know what I need to do".  Current Meal Plan: the Category 1 Plan for 30% of the time.  Current Exercise Plan: Treadmill, weights for 30 minutes 2 times per week. Current Anti-Obesity Medications: Ozempic 2 mg subcutaneously weekly. Side effects: None.  Assessment/Plan:   Medications Discontinued During This Encounter  Medication Reason   Semaglutide, 1 MG/DOSE, (OZEMPIC, 1 MG/DOSE,) 2 MG/1.5ML SOPN    glimepiride (AMARYL) 1 MG tablet Reorder   losartan (COZAAR) 50 MG tablet Reorder   Vitamin D, Ergocalciferol, (DRISDOL) 1.25 MG (50000 UNIT) CAPS capsule Reorder    Meds ordered this encounter  Medications   glimepiride (AMARYL) 1 MG tablet    Sig: Take 1 tablet (1 mg total) by mouth in the morning and at bedtime.    Dispense:  60 tablet    Refill:  0   losartan (COZAAR) 50 MG tablet    Sig: Take 1 tablet (50 mg total) by mouth daily.    Dispense:  30 tablet    Refill:  0   Vitamin D, Ergocalciferol, (DRISDOL) 1.25 MG (50000 UNIT) CAPS capsule    Sig: Take 1 capsule (50,000 Units total) by mouth every 7 (seven) days.    Dispense:  4 capsule    Refill:  0   Semaglutide, 2 MG/DOSE, 8 MG/3ML SOPN    Sig: Inject 2 mg as directed once a week.    Dispense:  3 mL    Refill:  0    1. Hypertension  associated with type 2 diabetes mellitus (HCC) At goal. Medications: Norvasc 5 mg daily, losartan 50 mg daily. No symptoms or concerns.  Denies dizziness or issues with medication.  Plan:  At goal.  Refill losartan.  Avoid buying foods that are: processed, frozen, or prepackaged to avoid excess salt. We will watch for signs of hypotension as he continues lifestyle modifications. We will continue to monitor closely alongside his PCP and/or Specialist.  Regular follow up with PCP and specialists was also encouraged.   BP Readings from Last 3 Encounters:  08/30/20 116/73  08/01/20 124/73  07/04/20 118/76   Lab Results  Component Value Date   CREATININE 1.20 03/14/2020   - Refill losartan (COZAAR) 50 MG tablet; Take 1 tablet (50 mg total) by mouth daily.  Dispense: 30 tablet; Refill: 0  2. Type 2 diabetes mellitus with other specified complication, without long-term current use of insulin (HCC) Diabetes Mellitus: Not at goal. Medication: Amaryl 1 mg twice daily, metformin 1,000 mg twice daily, Ozempic 2 mg subcutaneously weekly. Issues reviewed: blood sugar goals, complications of diabetes mellitus, hypoglycemia prevention and treatment, exercise, and nutrition.  FBS 103-164.  He can eat healthy and seen an immediate improvement the next day. No lows.  Plan:  A1c at goal.  Refill Amaryl.  Refill Ozempic.  Continue with prudent nutritional plan and weight loss.  The importance of regular follow up with PCP and all other specialists as scheduled was stressed to patient today. The patient will continue to focus on protein-rich, low simple carbohydrate foods. We reviewed the importance of hydration, regular exercise for stress reduction, and restorative sleep.   Lab Results  Component Value Date   HGBA1C 6.0 (H) 07/04/2020   HGBA1C 5.6 03/14/2020   HGBA1C 5.4 11/17/2019   Lab Results  Component Value Date   LDLCALC 69 02/01/2020   CREATININE 1.20 03/14/2020   - Refill glimepiride (AMARYL) 1  MG tablet; Take 1 tablet (1 mg total) by mouth in the morning and at bedtime.  Dispense: 60 tablet; Refill: 0 - Refill Semaglutide, 2 MG/DOSE, 8 MG/3ML SOPN; Inject 2 mg as directed once a week.  Dispense: 3 mL; Refill: 0  3. Vitamin D deficiency At goal.  He is taking vitamin D 50,000 IU weekly.  Plan: Continue to take prescription Vitamin D @50 ,000 IU every week as prescribed.  Follow-up for routine testing of Vitamin D, at least 2-3 times per year to avoid over-replacement.  Lab Results  Component Value Date   VD25OH 58.2 07/04/2020   VD25OH 75.1 03/14/2020   VD25OH 31.7 09/01/2019   - Refill Vitamin D, Ergocalciferol, (DRISDOL) 1.25 MG (50000 UNIT) CAPS capsule; Take 1 capsule (50,000 Units total) by mouth every 7 (seven) days.  Dispense: 4 capsule; Refill: 0  4. At risk for heart disease Due to [name]'s current state of health and medical condition(s), he is at a higher risk for heart disease.  We discussed his elevated visceral fat rating today and his history of heart disease he is at very high risk for another event.  This puts the patient at much greater risk to subsequently develop cardiopulmonary conditions that can significantly affect patient's quality of life in a negative manner.    At least 9 minutes were spent on counseling [name] about these concerns today, and I stressed the importance of reversing risks factors of obesity, especially truncal and visceral fat, hypertension, hyperlipidemia, and pre-diabetes.  The initial goal is to lose at least 5-10% of starting weight to help reduce these risk factors.  Counseling:  Intensive lifestyle modifications were discussed with [name] as the most appropriate first line of treatment.  he will continue to work on diet, exercise, and weight loss efforts.  We will continue to reassess these conditions on a fairly regular basis in an attempt to decrease the patient's overall morbidity and mortality.  Evidence-based interventions for health  behavior change were utilized today including the discussion of self monitoring techniques, problem-solving barriers, and SMART goal setting techniques.  Specifically, regarding patient's less desirable eating habits and patterns, we employed the technique of small changes when [name] has not been able to fully commit to his prudent nutritional plan.  5. Class 3 severe obesity with serious comorbidity and body mass index (BMI) of 40.0 to 44.9 in adult, unspecified obesity type (HCC)  Course: [name] is currently in the action stage of change. As such, his goal is to continue with weight loss efforts.   Nutrition goals: He has agreed to the Category 1 Plan.   Exercise goals: For substantial health benefits, adults should do at least 150 minutes (2 hours and 30 minutes) a week of moderate-intensity, or 75 minutes (1 hour and 15 minutes) a week of vigorous-intensity aerobic physical activity, or an  equivalent combination of moderate- and vigorous-intensity aerobic activity. Aerobic activity should be performed in episodes of at least 10 minutes, and preferably, it should be spread throughout the week. Including 2 days per week of strength training.  Behavioral modification strategies: celebration eating strategies and planning for success.  [name] has agreed to follow-up with our clinic in 3-4 weeks. He was informed of the importance of frequent follow-up visits to maximize his success with intensive lifestyle modifications for his multiple health conditions.   Objective:   Blood pressure 116/73, pulse 83, temperature 97.9 F (36.6 C), height 5\' 11"  (1.803 m), weight 255 lb (115.7 kg), SpO2 98 %. Body mass index is 35.57 kg/m.  General: Cooperative, alert, well developed, in no acute distress. HEENT: Conjunctivae and lids unremarkable. Cardiovascular: Regular rhythm.  Lungs: Normal work of breathing. Neurologic: No focal deficits.   Lab Results  Component Value Date   CREATININE 1.20  03/14/2020   BUN 23 03/14/2020   NA 141 03/14/2020   K 4.9 03/14/2020   CL 101 03/14/2020   CO2 26 03/14/2020   Lab Results  Component Value Date   ALT 28 03/14/2020   AST 20 03/14/2020   ALKPHOS 80 03/14/2020   BILITOT 0.7 03/14/2020   Lab Results  Component Value Date   HGBA1C 6.0 (H) 07/04/2020   HGBA1C 5.6 03/14/2020   HGBA1C 5.4 11/17/2019   HGBA1C 6.6 (H) 09/01/2019   HGBA1C 8.8 (H) 03/13/2019   Lab Results  Component Value Date   INSULIN 28.9 (H) 09/01/2019   Lab Results  Component Value Date   TSH 3.220 09/01/2019   Lab Results  Component Value Date   CHOL 132 02/01/2020   HDL 46 02/01/2020   LDLCALC 69 02/01/2020   TRIG 92 02/01/2020   CHOLHDL 2.9 02/01/2020   Lab Results  Component Value Date   VD25OH 58.2 07/04/2020   VD25OH 75.1 03/14/2020   VD25OH 31.7 09/01/2019   Lab Results  Component Value Date   WBC 7.3 11/17/2019   HGB 14.2 11/17/2019   HCT 42.3 11/17/2019   MCV 89.4 11/17/2019   PLT 284 11/17/2019   Attestation Statements:   Reviewed by clinician on day of visit: allergies, medications, problem list, medical history, surgical history, family history, social history, and previous encounter notes.  I, 01/17/2020, CMA, am acting as Insurance claims handler for Energy manager, DO.  I have reviewed the above documentation for accuracy and completeness, and I agree with the above. [employer], D.O.  The 21st Century Cures Act was signed into law in 2016 which includes the topic of electronic health records.  This provides immediate access to information in MyChart.  This includes consultation notes, operative notes, office notes, lab results and pathology reports.  If you have any questions about what you read please let 2017 know at your next visit so we can discuss your concerns and take corrective action if need be.  We are right here with you.

## 2020-09-21 ENCOUNTER — Other Ambulatory Visit: Payer: Self-pay

## 2020-09-21 ENCOUNTER — Ambulatory Visit (INDEPENDENT_AMBULATORY_CARE_PROVIDER_SITE_OTHER): Payer: BC Managed Care – PPO | Admitting: Ophthalmology

## 2020-09-21 ENCOUNTER — Encounter (INDEPENDENT_AMBULATORY_CARE_PROVIDER_SITE_OTHER): Payer: Self-pay | Admitting: Ophthalmology

## 2020-09-21 DIAGNOSIS — H2513 Age-related nuclear cataract, bilateral: Secondary | ICD-10-CM | POA: Insufficient documentation

## 2020-09-21 DIAGNOSIS — E113411 Type 2 diabetes mellitus with severe nonproliferative diabetic retinopathy with macular edema, right eye: Secondary | ICD-10-CM | POA: Diagnosis not present

## 2020-09-21 DIAGNOSIS — E113412 Type 2 diabetes mellitus with severe nonproliferative diabetic retinopathy with macular edema, left eye: Secondary | ICD-10-CM

## 2020-09-21 DIAGNOSIS — G4733 Obstructive sleep apnea (adult) (pediatric): Secondary | ICD-10-CM

## 2020-09-21 DIAGNOSIS — E113413 Type 2 diabetes mellitus with severe nonproliferative diabetic retinopathy with macular edema, bilateral: Secondary | ICD-10-CM | POA: Diagnosis not present

## 2020-09-21 NOTE — Progress Notes (Signed)
09/21/2020     CHIEF COMPLAINT Patient presents for Retina Follow Up (4 Month diabetic f\u OU, oct/Pt states vision is stable. Denies new floaters and FOL/A1C: 6.0/BS: 126 this morning)   HISTORY OF PRESENT ILLNESS: [name] is a 60 y.o. male who presents to the clinic today for:   HPI     Retina Follow Up           Diagnosis: Diabetic Retinopathy   Laterality: both eyes   Onset: 4 months ago   Severity: severe   Duration: 4 months   Course: stable   MD Performed: performed the HPI with the patient and updated documentation appropriately   Comments: 4 Month diabetic f\u OU, oct Pt states vision is stable. Denies new floaters and FOL A1C: 6.0 BS: 126 this morning       Last edited by Nelva Nay, COA on 09/21/2020  8:02 AM.      Referring physician: Marden Noble, MD [address] [place],  [place] [address]  HISTORICAL INFORMATION:   Selected notes from the MEDICAL RECORD NUMBER    Lab Results  Component Value Date   HGBA1C 6.0 (H) 07/04/2020     CURRENT MEDICATIONS: No current outpatient medications on file. (Ophthalmic Drugs)   No current facility-administered medications for this visit. (Ophthalmic Drugs)   Current Outpatient Medications (Other)  Medication Sig   albuterol (VENTOLIN HFA) 108 (90 Base) MCG/ACT inhaler Inhale 2 puffs into the lungs every 6 (six) hours as needed for wheezing or shortness of breath.   amLODipine (NORVASC) 5 MG tablet Take 5 mg by mouth daily.   aspirin EC 81 MG tablet Take 81 mg by mouth daily.   atorvastatin (LIPITOR) 10 MG tablet Take 1 tablet (10 mg total) by mouth daily.   cetirizine (ZYRTEC) 10 MG tablet Take 10 mg by mouth daily.   Cholecalciferol (VITAMIN D) 50 MCG (2000 UT) tablet Take 4,000 Units by mouth daily.    cloNIDine (CATAPRES) 0.2 MG tablet Take 0.2 mg by mouth every evening.    glimepiride (AMARYL) 1 MG tablet Take 1 tablet (1 mg total) by mouth in the morning and at bedtime.    loratadine (CLARITIN) 10 MG tablet Take 10 mg by mouth daily.    losartan (COZAAR) 50 MG tablet Take 1 tablet (50 mg total) by mouth daily.   Magnesium 400 MG CAPS Take 400 mg by mouth daily.    metFORMIN (GLUCOPHAGE-XR) 500 MG 24 hr tablet Take 1,000 mg by mouth 2 (two) times daily.    pantoprazole (PROTONIX) 40 MG tablet TAKE 1 TABLET BY MOUTH TWICE DAILY (Patient taking differently: Take 40 mg by mouth 2 (two) times daily.)   Semaglutide, 2 MG/DOSE, 8 MG/3ML SOPN Inject 2 mg as directed once a week.   Spacer/Aero-Holding Chambers (AEROCHAMBER MV) inhaler Use as instructed (Patient taking differently: as needed. Use as instructed for cough)   vitamin B-12 (CYANOCOBALAMIN) 500 MCG tablet Take 500 mcg by mouth daily.    Vitamin D, Ergocalciferol, (DRISDOL) 1.25 MG (50000 UNIT) CAPS capsule Take 1 capsule (50,000 Units total) by mouth every 7 (seven) days.   No current facility-administered medications for this visit. (Other)      REVIEW OF SYSTEMS:    ALLERGIES No Known Allergies  PAST MEDICAL HISTORY Past Medical History:  Diagnosis Date   ADD (attention deficit disorder)    Anginal pain (HCC) 2012   Arthritis, shoulder region    B12 deficiency  Back pain    Coronary artery disease    Diabetes (HCC)    Diabetic retinopathy (HCC)    GERD (gastroesophageal reflux disease)    H/O heart artery stent 04/2010   Hyperlipidemia    Hypertension    Joint pain    Knee pain    Neuropathy    NSTEMI (non-ST elevated myocardial infarction) (HCC)    Obesity    Osteoarthritis    Persistent cough    stopped for a year   Sleep apnea    Swallowing difficulty    Swelling of both lower extremities    Past Surgical History:  Procedure Laterality Date   ACHILLES TENDON REPAIR Right    AMPUTATION TOE Right 03/14/2019   Procedure: AMPUTATION TOE, 5th toe;  Surgeon: [name], DPM;  Location: [facility] OR;  Service: Podiatry;  Laterality: Right;   heart stent  2012   x1   INCISION AND  DRAINAGE Right 03/14/2019   Procedure: INCISION AND DRAINAGE;  Surgeon: [name], DPM;  Location: [facility] OR;  Service: Podiatry;  Laterality: Right;   SHOULDER ARTHROSCOPY Right    TEE WITHOUT CARDIOVERSION N/A 03/17/2019   Procedure: TRANSESOPHAGEAL ECHOCARDIOGRAM (TEE);  Surgeon: Chilton Si, MD;  Location: [facility] ENDOSCOPY;  Service: Cardiovascular;  Laterality: N/A;   TOE SURGERY Right 06/2018   for a hammer toe   TOTAL SHOULDER ARTHROPLASTY Right 11/19/2019   Procedure: TOTAL SHOULDER ARTHROPLASTY;  Surgeon: [name], MD;  Location: [facility] ORS;  Service: Orthopedics;  Laterality: Right;   UVULOPALATOPHARYNGOPLASTY      FAMILY HISTORY Family History  Problem Relation Age of Onset   Cancer Other    Diabetes Other    Heart disease Other    Breast cancer Mother    Cancer Mother    Heart disease Father    Diabetes Father    Stroke Father    Leukemia Brother    Asthma Maternal Uncle     SOCIAL HISTORY Social History   Tobacco Use   Smoking status: Passive Smoke Exposure - Never Smoker   Smokeless tobacco: Never   Tobacco comments:    Both parents growing up.   Vaping Use   Vaping Use: Never used  Substance Use Topics   Alcohol use: Yes    Comment: 4-5 on weekends   Drug use: No         OPHTHALMIC EXAM:  Base Eye Exam     Visual Acuity (ETDRS)       Right Left   Dist cc 20/20 20/20         Tonometry (Tonopen, 8:05 AM)       Right Left   Pressure 18 16         Pupils       Pupils Dark Light APD   Right PERRL 3 2 None   Left PERRL 3 2 None         Visual Fields (Counting fingers)       Left Right    Full Full         Extraocular Movement       Right Left    Full Full         Neuro/Psych     Oriented x3: Yes   Mood/Affect: Normal         Dilation     Both eyes: 1.0% Mydriacyl, 2.5% Phenylephrine @ 8:07 AM           Slit Lamp and Fundus Exam  External Exam       Right Left   External Normal Normal          Slit Lamp Exam       Right Left   Lids/Lashes Normal Normal   Conjunctiva/Sclera White and quiet White and quiet   Cornea Clear Clear   Anterior Chamber Deep and quiet Deep and quiet   Iris Round and reactive Round and reactive   Lens 1+ Nuclear sclerosis 1+ Nuclear sclerosis   Anterior Vitreous Normal Normal         Fundus Exam       Right Left   Posterior Vitreous Normal Normal   Disc Normal Normal   C/D Ratio 0.35 0.35   Macula Focal laser scars, Microaneurysms minor retinal thickening inferior improved Focal laser scars, Microaneurysms, minor thickening inferior to FAZ, not center involved   Vessels NPDR-Severe, with no cotton wool NPDR-Severe, with no cotton wool   Periphery Normal Normal            IMAGING AND PROCEDURES  Imaging and Procedures for 09/21/20  OCT, Retina - OU - Both Eyes       Right Eye Quality was good. Scan locations included subfoveal. Central Foveal Thickness: 334. Progression has been stable. Findings include abnormal foveal contour.   Left Eye Quality was good. Scan locations included subfoveal. Central Foveal Thickness: 324. Progression has improved. Findings include abnormal foveal contour.   Notes Diabetic CSME with focal leakages temporal OS and inferior to the fovea OS, yet with good acuity in each eye we have the option to observe and will as patient now has Improved blood sugar control, and continues with CPAP, compliance use    CSME OD, temporally, not center involved will continue to observe             ASSESSMENT/PLAN:  Obstructive sleep apnea Patient has been compliant with use of CPAP since onset of diagnosis in 10 years previous  Severe nonproliferative diabetic retinopathy of left eye, with macular edema, associated with type 2 diabetes mellitus (HCC) Local area of thickening inferior to the FAZ OS, improving post focal some 9 months previous we will continue to monitor and observe  Severe  nonproliferative diabetic retinopathy of right eye, with macular edema, associated with type 2 diabetes mellitus (HCC) Minor CSME inferotemporal to FAZ, overall stable or improving post focal we will continue to observe     ICD-10-CM   1. Severe nonproliferative diabetic retinopathy of right eye, with macular edema, associated with type 2 diabetes mellitus (HCC)  E11.3411 OCT, Retina - OU - Both Eyes    2. Severe nonproliferative diabetic retinopathy of left eye, with macular edema, associated with type 2 diabetes mellitus (HCC)  E11.3412 OCT, Retina - OU - Both Eyes    3. Obstructive sleep apnea  G47.33       1.  2.  3.  Ophthalmic Meds Ordered this visit:  No orders of the defined types were placed in this encounter.      Return in about 6 months (around 03/24/2021) for DILATE OU, OCT.  There are no Patient Instructions on file for this visit.   Explained the diagnoses, plan, and follow up with the patient and they expressed understanding.  Patient expressed understanding of the importance of proper follow up care.   Alford HighlandGary A. Jeena Arnett M.D. Diseases & Surgery of the Retina and Vitreous [facility] 09/21/20     Abbreviations: M myopia (nearsighted); A astigmatism; H hyperopia (  farsighted); P presbyopia; Mrx spectacle prescription;  CTL contact lenses; OD right eye; OS left eye; OU both eyes  XT exotropia; ET esotropia; PEK punctate epithelial keratitis; PEE punctate epithelial erosions; DES dry eye syndrome; MGD meibomian gland dysfunction; ATs artificial tears; PFAT's preservative free artificial tears; NSC nuclear sclerotic cataract; PSC posterior subcapsular cataract; ERM epi-retinal membrane; PVD posterior vitreous detachment; RD retinal detachment; DM diabetes mellitus; DR diabetic retinopathy; NPDR non-proliferative diabetic retinopathy; PDR proliferative diabetic retinopathy; CSME clinically significant macular edema; DME diabetic macular edema; dbh dot  blot hemorrhages; CWS cotton wool spot; POAG primary open angle glaucoma; C/D cup-to-disc ratio; HVF humphrey visual field; GVF goldmann visual field; OCT optical coherence tomography; IOP intraocular pressure; BRVO Branch retinal vein occlusion; CRVO central retinal vein occlusion; CRAO central retinal artery occlusion; BRAO branch retinal artery occlusion; RT retinal tear; SB scleral buckle; PPV pars plana vitrectomy; VH Vitreous hemorrhage; PRP panretinal laser photocoagulation; IVK intravitreal kenalog; VMT vitreomacular traction; MH Macular hole;  NVD neovascularization of the disc; NVE neovascularization elsewhere; AREDS age related eye disease study; ARMD age related macular degeneration; POAG primary open angle glaucoma; EBMD epithelial/anterior basement membrane dystrophy; ACIOL anterior chamber intraocular lens; IOL intraocular lens; PCIOL posterior chamber intraocular lens; Phaco/IOL phacoemulsification with intraocular lens placement; PRK photorefractive keratectomy; LASIK laser assisted in situ keratomileusis; HTN hypertension; DM diabetes mellitus; COPD chronic obstructive pulmonary disease

## 2020-09-21 NOTE — Assessment & Plan Note (Signed)
Patient has been compliant with use of CPAP since onset of diagnosis in 10 years previous

## 2020-09-21 NOTE — Assessment & Plan Note (Signed)
Minor CSME inferotemporal to FAZ, overall stable or improving post focal we will continue to observe

## 2020-09-21 NOTE — Assessment & Plan Note (Signed)

## 2020-09-21 NOTE — Assessment & Plan Note (Signed)
Local area of thickening inferior to the FAZ OS, improving post focal some 9 months previous we will continue to monitor and observe

## 2020-09-22 ENCOUNTER — Encounter (INDEPENDENT_AMBULATORY_CARE_PROVIDER_SITE_OTHER): Payer: BC Managed Care – PPO | Admitting: Ophthalmology

## 2020-09-26 ENCOUNTER — Ambulatory Visit (INDEPENDENT_AMBULATORY_CARE_PROVIDER_SITE_OTHER): Payer: BC Managed Care – PPO | Admitting: Family Medicine

## 2020-09-28 ENCOUNTER — Ambulatory Visit (INDEPENDENT_AMBULATORY_CARE_PROVIDER_SITE_OTHER): Payer: BC Managed Care – PPO | Admitting: Family Medicine

## 2020-09-28 ENCOUNTER — Encounter (INDEPENDENT_AMBULATORY_CARE_PROVIDER_SITE_OTHER): Payer: Self-pay | Admitting: Family Medicine

## 2020-09-28 ENCOUNTER — Other Ambulatory Visit: Payer: Self-pay

## 2020-09-28 VITALS — BP 145/79 | HR 77 | Temp 97.8°F | Ht 71.0 in | Wt 259.0 lb

## 2020-09-28 DIAGNOSIS — E559 Vitamin D deficiency, unspecified: Secondary | ICD-10-CM | POA: Diagnosis not present

## 2020-09-28 DIAGNOSIS — E1169 Type 2 diabetes mellitus with other specified complication: Secondary | ICD-10-CM | POA: Diagnosis not present

## 2020-09-28 DIAGNOSIS — Z6841 Body Mass Index (BMI) 40.0 and over, adult: Secondary | ICD-10-CM

## 2020-09-28 DIAGNOSIS — E7849 Other hyperlipidemia: Secondary | ICD-10-CM

## 2020-09-28 DIAGNOSIS — Z9189 Other specified personal risk factors, not elsewhere classified: Secondary | ICD-10-CM | POA: Diagnosis not present

## 2020-09-28 DIAGNOSIS — R5383 Other fatigue: Secondary | ICD-10-CM

## 2020-09-28 DIAGNOSIS — E1159 Type 2 diabetes mellitus with other circulatory complications: Secondary | ICD-10-CM

## 2020-09-28 DIAGNOSIS — I152 Hypertension secondary to endocrine disorders: Secondary | ICD-10-CM

## 2020-09-28 MED ORDER — VITAMIN D (ERGOCALCIFEROL) 1.25 MG (50000 UNIT) PO CAPS: 50000.0000 [IU] | ORAL_CAPSULE | ORAL | 0 refills | Status: DC

## 2020-09-28 MED ORDER — SEMAGLUTIDE (2 MG/DOSE) 8 MG/3ML ~~LOC~~ SOPN: 2.0000 mg | PEN_INJECTOR | SUBCUTANEOUS | 0 refills | Status: DC

## 2020-09-28 MED ORDER — GLIMEPIRIDE 1 MG PO TABS: 1.0000 mg | ORAL_TABLET | Freq: Two times a day (BID) | ORAL | 0 refills | Status: DC

## 2020-09-28 MED ORDER — LOSARTAN POTASSIUM 50 MG PO TABS: 50.0000 mg | ORAL_TABLET | Freq: Every day | ORAL | 0 refills | Status: DC

## 2020-09-29 NOTE — Progress Notes (Signed)
Chief Complaint:   OBESITY [name] is here to discuss his progress with his obesity treatment plan along with follow-up of his obesity related diagnoses. [name] is on the Category 1 Plan and states he is following his eating plan approximately 50% of the time. [name] states he is on the treadmill and weight lifting for 60 minutes 2 times per week.  Today's visit was #: 20 Starting weight: 310 lbs Starting date: 09/01/2019 Today's weight: 259 lbs Today's date: 09/28/2020 Total lbs lost to date: 51 Total lbs lost since last in-office visit: 0  Interim History: [name] came back from the beach and concert, etc. He knows he needs to get back on the plan because he hasn't been for several weeks now. He has put on 8 lbs since May.  Subjective:   1. Type 2 diabetes mellitus with other specified complication, without long-term current use of insulin (HCC) [name]'s fasting BGs range between 100-168 in general.  2. Other hyperlipidemia [name] denies issues with his medications, and his compliance is good with Lipitor.  3. Hypertension associated with type 2 diabetes mellitus (HCC) [name] is tolerating medication(s) well without side effects. Medication compliance is good and patient appears to be taking it as prescribed.  Denies additional concerns regarding this condition. He is not checking his blood pressure at home, and he is asymptomatic.  4. Vitamin D deficiency [name] is currently taking prescription vitamin D 50,000 IU each week. He denies nausea, vomiting or muscle weakness.  5. Other fatigue  6. At risk for dehydration [name] is at risk for dehydration due to poor oral fluid intake.  Assessment/Plan:   Orders Placed This Encounter  Procedures   Comprehensive metabolic panel   Lipid Panel With LDL/HDL Ratio   VITAMIN D 25 Hydroxy (Vit-D Deficiency, Fractures)   CBC with Differential/Platelet   TSH   T4, free   Vitamin B12   Insulin, random    Medications  Discontinued During This Encounter  Medication Reason   glimepiride (AMARYL) 1 MG tablet Reorder   losartan (COZAAR) 50 MG tablet Reorder   Vitamin D, Ergocalciferol, (DRISDOL) 1.25 MG (50000 UNIT) CAPS capsule Reorder   Semaglutide, 2 MG/DOSE, 8 MG/3ML SOPN Reorder     Meds ordered this encounter  Medications   glimepiride (AMARYL) 1 MG tablet    Sig: Take 1 tablet (1 mg total) by mouth in the morning and at bedtime.    Dispense:  60 tablet    Refill:  0   losartan (COZAAR) 50 MG tablet    Sig: Take 1 tablet (50 mg total) by mouth daily.    Dispense:  30 tablet    Refill:  0   Semaglutide, 2 MG/DOSE, 8 MG/3ML SOPN    Sig: Inject 2 mg as directed once a week.    Dispense:  3 mL    Refill:  0   Vitamin D, Ergocalciferol, (DRISDOL) 1.25 MG (50000 UNIT) CAPS capsule    Sig: Take 1 capsule (50,000 Units total) by mouth every 7 (seven) days.    Dispense:  4 capsule    Refill:  0     1. Type 2 diabetes mellitus with other specified complication, without long-term current use of insulin (HCC) [name] will continue his medications, and will we will refill Amaryl and Semaglutide for 1 month. We will recheck labs at his next office visit. Good blood sugar control is important to decrease the likelihood of diabetic complications such as nephropathy, neuropathy, limb loss,  blindness, coronary artery disease, and death. Intensive lifestyle modification including diet, exercise and weight loss are the first line of treatment for diabetes.   - glimepiride (AMARYL) 1 MG tablet; Take 1 tablet (1 mg total) by mouth in the morning and at bedtime.  Dispense: 60 tablet; Refill: 0 - Semaglutide, 2 MG/DOSE, 8 MG/3ML SOPN; Inject 2 mg as directed once a week.  Dispense: 3 mL; Refill: 0 - Comprehensive metabolic panel - Insulin, random - CBC with Differential/Platelet - TSH - T4, free - Vitamin B12  2. Other hyperlipidemia Cardiovascular risk and specific lipid/LDL goals reviewed.  We discussed  several lifestyle modifications today. We will recheck labs at his next office visit. [name] will continue to work on diet, exercise and weight loss efforts. Orders and follow up as documented in patient record.   Counseling Intensive lifestyle modifications are the first line treatment for this issue. Dietary changes: Increase soluble fiber. Decrease simple carbohydrates. Exercise changes: Moderate to vigorous-intensity aerobic activity 150 minutes per week if tolerated. Lipid-lowering medications: see documented in medical record.  - Lipid Panel With LDL/HDL Ratio  3. Hypertension associated with type 2 diabetes mellitus (HCC) [name]'s blood pressure is not at goal. He will continue his prudent nutritional plan, decrease salt, and if no improvement at his next office visit, we will need to change his dose. We will refill losartan for 1 month.  - losartan (COZAAR) 50 MG tablet; Take 1 tablet (50 mg total) by mouth daily.  Dispense: 30 tablet; Refill: 0  4. Vitamin D deficiency Low Vitamin D level contributes to fatigue and are associated with obesity, breast, and colon cancer. We will refill prescription Vitamin D for 1 month, and we will recheck labs at his next office visit. [name] will follow-up for routine testing of Vitamin D, at least 2-3 times per year to avoid over-replacement.  - Vitamin D, Ergocalciferol, (DRISDOL) 1.25 MG (50000 UNIT) CAPS capsule; Take 1 capsule (50,000 Units total) by mouth every 7 (seven) days.  Dispense: 4 capsule; Refill: 0 - VITAMIN D 25 Hydroxy (Vit-D Deficiency, Fractures)  5. Other fatigue [name] will focus on self care including making healthy food choices, increasing physical activity and focusing on stress reduction. We will recheck labs at his next office visit.  - CBC with Differential/Platelet - TSH - T4, free - Vitamin B12  6. At risk for dehydration [name] is at higher than average risk of dehydration.  [name] was given more than 9 minutes  of proper hydration counseling today.  We discussed the signs and symptoms of dehydration, some of which may include muscle cramping, constipation or even orthostatic symptoms.  Counseling on the prevention of dehydration was also provided today.  [name] is at risk for dehydration due to weight loss, lifestyle and behavorial habits and possibly due to taking certain medication(s).  He was encouraged to adequately hydrate and monitor fluid status to avoid dehydration as well as weight loss plateaus.  Unless pre-existing renal or cardiopulmonary conditions exist, in which patient was told to limit their fluid intake, I recommended roughly one half of their weight in pounds to be the approximate ounces of non-caloric, non-caffeinated beverages they should drink per day; including more if they are engaging in exercise.  7. Obesity with current BMI of 36.2 [name] is currently in the action stage of change. As such, his goal is to continue with weight loss efforts. He has agreed to the Category 1 Plan.   We will repeat fasting labs and IC at  his next office visit.  Exercise goals: As is.  Behavioral modification strategies: increasing lean protein intake, decreasing simple carbohydrates, decreasing eating out, keeping healthy foods in the home, and planning for success.  [name] has agreed to follow-up with our clinic in 3 to 4 weeks. He was informed of the importance of frequent follow-up visits to maximize his success with intensive lifestyle modifications for his multiple health conditions.   Objective:   Blood pressure (!) 145/79, pulse 77, temperature 97.8 F (36.6 C), height 5\' 11"  (1.803 m), weight 259 lb (117.5 kg), SpO2 99 %. Body mass index is 36.12 kg/m.  General: Cooperative, alert, well developed, in no acute distress. HEENT: Conjunctivae and lids unremarkable. Cardiovascular: Regular rhythm.  Lungs: Normal work of breathing. Neurologic: No focal deficits.   Lab Results  Component  Value Date   CREATININE 1.20 03/14/2020   BUN 23 03/14/2020   NA 141 03/14/2020   K 4.9 03/14/2020   CL 101 03/14/2020   CO2 26 03/14/2020   Lab Results  Component Value Date   ALT 28 03/14/2020   AST 20 03/14/2020   ALKPHOS 80 03/14/2020   BILITOT 0.7 03/14/2020   Lab Results  Component Value Date   HGBA1C 6.0 (H) 07/04/2020   HGBA1C 5.6 03/14/2020   HGBA1C 5.4 11/17/2019   HGBA1C 6.6 (H) 09/01/2019   HGBA1C 8.8 (H) 03/13/2019   Lab Results  Component Value Date   INSULIN 28.9 (H) 09/01/2019   Lab Results  Component Value Date   TSH 3.220 09/01/2019   Lab Results  Component Value Date   CHOL 132 02/01/2020   HDL 46 02/01/2020   LDLCALC 69 02/01/2020   TRIG 92 02/01/2020   CHOLHDL 2.9 02/01/2020   Lab Results  Component Value Date   VD25OH 58.2 07/04/2020   VD25OH 75.1 03/14/2020   VD25OH 31.7 09/01/2019   Lab Results  Component Value Date   WBC 7.3 11/17/2019   HGB 14.2 11/17/2019   HCT 42.3 11/17/2019   MCV 89.4 11/17/2019   PLT 284 11/17/2019   No results found for: IRON, TIBC, FERRITIN  Attestation Statements:   Reviewed by clinician on day of visit: allergies, medications, problem list, medical history, surgical history, family history, social history, and previous encounter notes.   01/17/2020, am acting as transcriptionist for Trude Mcburney, DO.  I have reviewed the above documentation for accuracy and completeness, and I agree with the above. [employer], D.O.  The 21st Century Cures Act was signed into law in 2016 which includes the topic of electronic health records.  This provides immediate access to information in MyChart.  This includes consultation notes, operative notes, office notes, lab results and pathology reports.  If you have any questions about what you read please let 2017 know at your next visit so we can discuss your concerns and take corrective action if need be.  We are right here with you.

## 2020-10-10 DIAGNOSIS — F331 Major depressive disorder, recurrent, moderate: Secondary | ICD-10-CM | POA: Diagnosis not present

## 2020-10-10 DIAGNOSIS — E1140 Type 2 diabetes mellitus with diabetic neuropathy, unspecified: Secondary | ICD-10-CM | POA: Diagnosis not present

## 2020-10-10 DIAGNOSIS — G4733 Obstructive sleep apnea (adult) (pediatric): Secondary | ICD-10-CM | POA: Diagnosis not present

## 2020-10-10 DIAGNOSIS — E113413 Type 2 diabetes mellitus with severe nonproliferative diabetic retinopathy with macular edema, bilateral: Secondary | ICD-10-CM | POA: Diagnosis not present

## 2020-10-28 ENCOUNTER — Other Ambulatory Visit (INDEPENDENT_AMBULATORY_CARE_PROVIDER_SITE_OTHER): Payer: Self-pay | Admitting: Family Medicine

## 2020-10-28 ENCOUNTER — Other Ambulatory Visit: Payer: Self-pay | Admitting: Internal Medicine

## 2020-10-28 DIAGNOSIS — E559 Vitamin D deficiency, unspecified: Secondary | ICD-10-CM

## 2020-10-28 DIAGNOSIS — I152 Hypertension secondary to endocrine disorders: Secondary | ICD-10-CM

## 2020-10-28 DIAGNOSIS — E1159 Type 2 diabetes mellitus with other circulatory complications: Secondary | ICD-10-CM

## 2020-10-31 ENCOUNTER — Other Ambulatory Visit: Payer: Self-pay

## 2020-10-31 ENCOUNTER — Ambulatory Visit (INDEPENDENT_AMBULATORY_CARE_PROVIDER_SITE_OTHER): Payer: BC Managed Care – PPO | Admitting: Family Medicine

## 2020-10-31 ENCOUNTER — Encounter (INDEPENDENT_AMBULATORY_CARE_PROVIDER_SITE_OTHER): Payer: Self-pay | Admitting: Family Medicine

## 2020-10-31 VITALS — BP 115/76 | HR 79 | Temp 97.8°F | Ht 71.0 in | Wt 259.0 lb

## 2020-10-31 DIAGNOSIS — I152 Hypertension secondary to endocrine disorders: Secondary | ICD-10-CM

## 2020-10-31 DIAGNOSIS — Z9189 Other specified personal risk factors, not elsewhere classified: Secondary | ICD-10-CM | POA: Diagnosis not present

## 2020-10-31 DIAGNOSIS — E1169 Type 2 diabetes mellitus with other specified complication: Secondary | ICD-10-CM

## 2020-10-31 DIAGNOSIS — R5383 Other fatigue: Secondary | ICD-10-CM | POA: Diagnosis not present

## 2020-10-31 DIAGNOSIS — E559 Vitamin D deficiency, unspecified: Secondary | ICD-10-CM

## 2020-10-31 DIAGNOSIS — E1159 Type 2 diabetes mellitus with other circulatory complications: Secondary | ICD-10-CM | POA: Diagnosis not present

## 2020-10-31 DIAGNOSIS — R0602 Shortness of breath: Secondary | ICD-10-CM | POA: Diagnosis not present

## 2020-10-31 DIAGNOSIS — Z6841 Body Mass Index (BMI) 40.0 and over, adult: Secondary | ICD-10-CM

## 2020-10-31 MED ORDER — LOSARTAN POTASSIUM 50 MG PO TABS
50.0000 mg | ORAL_TABLET | Freq: Every day | ORAL | 0 refills | Status: DC
Start: 2020-10-31 — End: 2020-11-30

## 2020-10-31 MED ORDER — VITAMIN D (ERGOCALCIFEROL) 1.25 MG (50000 UNIT) PO CAPS: 50000.0000 [IU] | ORAL_CAPSULE | ORAL | 0 refills | Status: DC

## 2020-10-31 MED ORDER — SEMAGLUTIDE (2 MG/DOSE) 8 MG/3ML ~~LOC~~ SOPN: 2.0000 mg | PEN_INJECTOR | SUBCUTANEOUS | 0 refills | Status: DC

## 2020-10-31 NOTE — Progress Notes (Signed)
Chief Complaint:   OBESITY [name] is here to discuss his progress with his obesity treatment plan along with follow-up of his obesity related diagnoses. [name] is on the Category 1 Plan and states he is following his eating plan approximately 30% of the time. [name] states he is not currently exercising.  Today's visit was #: 21 Starting weight: 310 lbs Starting date: 09/01/2019 Today's weight: 259 lbs Today's date: 10/31/2020 Total lbs lost to date: 51 Total lbs lost since last in-office visit: 0  Interim History: [name] is frustrated a bit with himself and wishes for more weight loss. Repeat IC was done today, which showed 500+ improvement in REE. Pt never snacks when on plan.  Subjective:   1. Type 2 diabetes mellitus with other specified complication, without long-term current use of insulin (HCC) Medications reviewed. Fasting blood sugars run 110-168.  Lab Results  Component Value Date   HGBA1C 6.0 (H) 07/04/2020   HGBA1C 5.6 03/14/2020   HGBA1C 5.4 11/17/2019   Lab Results  Component Value Date   LDLCALC 69 02/01/2020   CREATININE 1.20 03/14/2020   Lab Results  Component Value Date   INSULIN 28.9 (H) 09/01/2019   2. Hypertension associated with type 2 diabetes mellitus (HCC) Cardiovascular ROS: no chest pain or dyspnea on exertion.  BP Readings from Last 3 Encounters:  10/31/20 115/76  09/28/20 (!) 145/79  08/30/20 116/73   Lab Results  Component Value Date   CREATININE 1.20 03/14/2020   CREATININE 1.51 (H) 11/17/2019   CREATININE 1.30 (H) 09/01/2019   3. Vitamin D deficiency He is currently taking prescription vitamin D 50,000 IU each week. He denies nausea, vomiting or muscle weakness.  Lab Results  Component Value Date   VD25OH 58.2 07/04/2020   VD25OH 75.1 03/14/2020   VD25OH 31.7 09/01/2019   4. Shortness of breath on exertion [name] notes increasing shortness of breath with exercising and seems to be worsening over time with weight gain. He  notes getting out of breath sooner with activity than he used to. This has gotten worse recently. [name] denies shortness of breath at rest or orthopnea.  5. At risk for malnutrition [name] is at risk for malnutrition due to new IC results.   Assessment/Plan:   Orders Placed This Encounter  Procedures   Hemoglobin A1c    Medications Discontinued During This Encounter  Medication Reason   losartan (COZAAR) 50 MG tablet Reorder   Semaglutide, 2 MG/DOSE, 8 MG/3ML SOPN Reorder   Vitamin D, Ergocalciferol, (DRISDOL) 1.25 MG (50000 UNIT) CAPS capsule Reorder     Meds ordered this encounter  Medications   losartan (COZAAR) 50 MG tablet    Sig: Take 1 tablet (50 mg total) by mouth daily.    Dispense:  30 tablet    Refill:  0   Semaglutide, 2 MG/DOSE, 8 MG/3ML SOPN    Sig: Inject 2 mg as directed once a week.    Dispense:  3 mL    Refill:  0   Vitamin D, Ergocalciferol, (DRISDOL) 1.25 MG (50000 UNIT) CAPS capsule    Sig: Take 1 capsule (50,000 Units total) by mouth every 7 (seven) days.    Dispense:  4 capsule    Refill:  0     1. Type 2 diabetes mellitus with other specified complication, without long-term current use of insulin (HCC) Good blood sugar control is important to decrease the likelihood of diabetic complications such as nephropathy, neuropathy, limb loss, blindness, coronary artery disease, and  death. Intensive lifestyle modification including diet, exercise and weight loss are the first line of treatment for diabetes. Check labs today.  Refill- Semaglutide, 2 MG/DOSE, 8 MG/3ML SOPN; Inject 2 mg as directed once a week.  Dispense: 3 mL; Refill: 0  - Hemoglobin A1c  2. Hypertension associated with type 2 diabetes mellitus (HCC) [name] is working on healthy weight loss and exercise to improve blood pressure control. We will watch for signs of hypotension as he continues his lifestyle modifications.  Refill- losartan (COZAAR) 50 MG tablet; Take 1 tablet (50 mg total) by  mouth daily.  Dispense: 30 tablet; Refill: 0  3. Vitamin D deficiency Low Vitamin D level contributes to fatigue and are associated with obesity, breast, and colon cancer. He agrees to continue to take prescription Vitamin D 50,000 IU every week and will follow-up for routine testing of Vitamin D, at least 2-3 times per year to avoid over-replacement.  Refill- Vitamin D, Ergocalciferol, (DRISDOL) 1.25 MG (50000 UNIT) CAPS capsule; Take 1 capsule (50,000 Units total) by mouth every 7 (seven) days.  Dispense: 4 capsule; Refill: 0  4. Shortness of breath on exertion [name] does feel that he gets out of breath more easily that he used to when he exercises. [name]'s shortness of breath appears to be obesity related and exercise induced. He has agreed to work on weight loss and gradually increase exercise to treat his exercise induced shortness of breath. Will continue to monitor closely. Check IC today.  5. At risk for malnutrition [name] was given approximately 23 minutes of counseling today regarding prevention of malnutrition and ways to meet macronutrient goals..   6. Obesity with current BMI of 36.2  [name] is currently in the action stage of change. As such, his goal is to continue with weight loss efforts. He has agreed to change to the Category 3 Plan without any snacks.   Change plan due to new IC results.   Exercise goals: All adults should avoid inactivity. Some physical activity is better than none, and adults who participate in any amount of physical activity gain some health benefits.  Behavioral modification strategies: increasing lean protein intake, decreasing simple carbohydrates, and planning for success.  [name] has agreed to follow-up with our clinic in 2-3 weeks. He was informed of the importance of frequent follow-up visits to maximize his success with intensive lifestyle modifications for his multiple health conditions.   [name] was informed we would discuss his lab  results at his next visit unless there is a critical issue that needs to be addressed sooner. [name] agreed to keep his next visit at the agreed upon time to discuss these results.  Objective:   Blood pressure 115/76, pulse 79, temperature 97.8 F (36.6 C), height 5\' 11"  (1.803 m), weight 259 lb (117.5 kg), SpO2 98 %. Body mass index is 36.12 kg/m.  General: Cooperative, alert, well developed, in no acute distress. HEENT: Conjunctivae and lids unremarkable. Cardiovascular: Regular rhythm.  Lungs: Normal work of breathing. Neurologic: No focal deficits.   Lab Results  Component Value Date   CREATININE 1.20 03/14/2020   BUN 23 03/14/2020   NA 141 03/14/2020   K 4.9 03/14/2020   CL 101 03/14/2020   CO2 26 03/14/2020   Lab Results  Component Value Date   ALT 28 03/14/2020   AST 20 03/14/2020   ALKPHOS 80 03/14/2020   BILITOT 0.7 03/14/2020   Lab Results  Component Value Date   HGBA1C 6.0 (H) 07/04/2020   HGBA1C 5.6  03/14/2020   HGBA1C 5.4 11/17/2019   HGBA1C 6.6 (H) 09/01/2019   HGBA1C 8.8 (H) 03/13/2019   Lab Results  Component Value Date   INSULIN 28.9 (H) 09/01/2019   Lab Results  Component Value Date   TSH 3.220 09/01/2019   Lab Results  Component Value Date   CHOL 132 02/01/2020   HDL 46 02/01/2020   LDLCALC 69 02/01/2020   TRIG 92 02/01/2020   CHOLHDL 2.9 02/01/2020   Lab Results  Component Value Date   VD25OH 58.2 07/04/2020   VD25OH 75.1 03/14/2020   VD25OH 31.7 09/01/2019   Lab Results  Component Value Date   WBC 7.3 11/17/2019   HGB 14.2 11/17/2019   HCT 42.3 11/17/2019   MCV 89.4 11/17/2019   PLT 284 11/17/2019    Attestation Statements:   Reviewed by clinician on day of visit: allergies, medications, problem list, medical history, surgical history, family history, social history, and previous encounter notes.  Edmund Hilda, CMA, am acting as transcriptionist for [employer], DO.  I have reviewed the above documentation for  accuracy and completeness, and I agree with the above. Carlye Grippe, D.O.  The 21st Century Cures Act was signed into law in 2016 which includes the topic of electronic health records.  This provides immediate access to information in MyChart.  This includes consultation notes, operative notes, office notes, lab results and pathology reports.  If you have any questions about what you read please let us know at your next visit so we can discuss your concerns and take corrective action if need be.  We are right here with you.

## 2020-11-01 LAB — VITAMIN D 25 HYDROXY (VIT D DEFICIENCY, FRACTURES): Vit D, 25-Hydroxy: 84.9 ng/mL (ref 30.0–100.0)

## 2020-11-01 LAB — COMPREHENSIVE METABOLIC PANEL
ALT: 24 IU/L (ref 0–44)
AST: 20 IU/L (ref 0–40)
Albumin/Globulin Ratio: 2.2 (ref 1.2–2.2)
Albumin: 4.3 g/dL (ref 3.8–4.9)
Alkaline Phosphatase: 84 IU/L (ref 44–121)
BUN/Creatinine Ratio: 18 (ref 9–20)
BUN: 23 mg/dL (ref 6–24)
Bilirubin Total: 0.9 mg/dL (ref 0.0–1.2)
CO2: 21 mmol/L (ref 20–29)
Calcium: 9.5 mg/dL (ref 8.7–10.2)
Chloride: 100 mmol/L (ref 96–106)
Creatinine, Ser: 1.29 mg/dL — ABNORMAL HIGH (ref 0.76–1.27)
Globulin, Total: 2 g/dL (ref 1.5–4.5)
Glucose: 115 mg/dL — ABNORMAL HIGH (ref 65–99)
Potassium: 4.8 mmol/L (ref 3.5–5.2)
Sodium: 137 mmol/L (ref 134–144)
Total Protein: 6.3 g/dL (ref 6.0–8.5)
eGFR: 64 mL/min/{1.73_m2} (ref >59)

## 2020-11-01 LAB — CBC WITH DIFFERENTIAL/PLATELET
Basophils Absolute: 0.1 10*3/uL (ref 0.0–0.2)
Basos: 1 %
EOS (ABSOLUTE): 0.4 10*3/uL (ref 0.0–0.4)
Eos: 6 %
Hematocrit: 44.8 % (ref 37.5–51.0)
Hemoglobin: 15.2 g/dL (ref 13.0–17.7)
Immature Grans (Abs): 0 10*3/uL (ref 0.0–0.1)
Immature Granulocytes: 0 %
Lymphocytes Absolute: 1.9 10*3/uL (ref 0.7–3.1)
Lymphs: 25 %
MCH: 30.8 pg (ref 26.6–33.0)
MCHC: 33.9 g/dL (ref 31.5–35.7)
MCV: 91 fL (ref 79–97)
Monocytes Absolute: 0.7 10*3/uL (ref 0.1–0.9)
Monocytes: 9 %
Neutrophils Absolute: 4.7 10*3/uL (ref 1.4–7.0)
Neutrophils: 59 %
Platelets: 285 10*3/uL (ref 150–450)
RBC: 4.93 x10E6/uL (ref 4.14–5.80)
RDW: 12.5 % (ref 11.6–15.4)
WBC: 7.9 10*3/uL (ref 3.4–10.8)

## 2020-11-01 LAB — HEMOGLOBIN A1C
Est. average glucose Bld gHb Est-mCnc: 137 mg/dL
Hgb A1c MFr Bld: 6.4 % — ABNORMAL HIGH (ref 4.8–5.6)

## 2020-11-01 LAB — LIPID PANEL WITH LDL/HDL RATIO
Cholesterol, Total: 151 mg/dL (ref 100–199)
HDL: 53 mg/dL (ref >39)
LDL Chol Calc (NIH): 76 mg/dL (ref 0–99)
LDL/HDL Ratio: 1.4 ratio (ref 0.0–3.6)
Triglycerides: 122 mg/dL (ref 0–149)
VLDL Cholesterol Cal: 22 mg/dL (ref 5–40)

## 2020-11-01 LAB — T4, FREE: Free T4: 1.45 ng/dL (ref 0.82–1.77)

## 2020-11-01 LAB — INSULIN, RANDOM: INSULIN: 9.5 u[IU]/mL (ref 2.6–24.9)

## 2020-11-01 LAB — TSH: TSH: 4.17 u[IU]/mL (ref 0.450–4.500)

## 2020-11-01 LAB — VITAMIN B12: Vitamin B-12: 829 pg/mL (ref 232–1245)

## 2020-11-08 ENCOUNTER — Encounter (INDEPENDENT_AMBULATORY_CARE_PROVIDER_SITE_OTHER): Payer: Self-pay

## 2020-11-08 ENCOUNTER — Other Ambulatory Visit (INDEPENDENT_AMBULATORY_CARE_PROVIDER_SITE_OTHER): Payer: Self-pay | Admitting: Family Medicine

## 2020-11-08 DIAGNOSIS — E1169 Type 2 diabetes mellitus with other specified complication: Secondary | ICD-10-CM

## 2020-11-08 NOTE — Telephone Encounter (Signed)
MyChart message sent to pt to find out if they have enough medication to get them through until next appt.   

## 2020-11-09 DIAGNOSIS — G4733 Obstructive sleep apnea (adult) (pediatric): Secondary | ICD-10-CM | POA: Diagnosis not present

## 2020-11-10 ENCOUNTER — Other Ambulatory Visit (INDEPENDENT_AMBULATORY_CARE_PROVIDER_SITE_OTHER): Payer: Self-pay | Admitting: Family Medicine

## 2020-11-30 ENCOUNTER — Ambulatory Visit (INDEPENDENT_AMBULATORY_CARE_PROVIDER_SITE_OTHER): Payer: BC Managed Care – PPO | Admitting: Family Medicine

## 2020-11-30 ENCOUNTER — Other Ambulatory Visit: Payer: Self-pay

## 2020-11-30 ENCOUNTER — Encounter (INDEPENDENT_AMBULATORY_CARE_PROVIDER_SITE_OTHER): Payer: Self-pay | Admitting: Family Medicine

## 2020-11-30 VITALS — BP 122/77 | HR 75 | Temp 97.4°F | Ht 71.0 in | Wt 264.0 lb

## 2020-11-30 DIAGNOSIS — E559 Vitamin D deficiency, unspecified: Secondary | ICD-10-CM

## 2020-11-30 DIAGNOSIS — E785 Hyperlipidemia, unspecified: Secondary | ICD-10-CM

## 2020-11-30 DIAGNOSIS — I152 Hypertension secondary to endocrine disorders: Secondary | ICD-10-CM

## 2020-11-30 DIAGNOSIS — Z6836 Body mass index (BMI) 36.0-36.9, adult: Secondary | ICD-10-CM

## 2020-11-30 DIAGNOSIS — Z9189 Other specified personal risk factors, not elsewhere classified: Secondary | ICD-10-CM

## 2020-11-30 DIAGNOSIS — E1159 Type 2 diabetes mellitus with other circulatory complications: Secondary | ICD-10-CM

## 2020-11-30 DIAGNOSIS — E1169 Type 2 diabetes mellitus with other specified complication: Secondary | ICD-10-CM

## 2020-11-30 MED ORDER — SEMAGLUTIDE (2 MG/DOSE) 8 MG/3ML ~~LOC~~ SOPN: 2.0000 mg | PEN_INJECTOR | SUBCUTANEOUS | 0 refills | Status: DC

## 2020-11-30 MED ORDER — LOSARTAN POTASSIUM 50 MG PO TABS: 50.0000 mg | ORAL_TABLET | Freq: Every day | ORAL | 0 refills | Status: DC

## 2020-11-30 MED ORDER — GLIMEPIRIDE 1 MG PO TABS: ORAL_TABLET | ORAL | 0 refills | Status: DC

## 2020-11-30 MED ORDER — VITAMIN D (ERGOCALCIFEROL) 1.25 MG (50000 UNIT) PO CAPS: 50000.0000 [IU] | ORAL_CAPSULE | ORAL | 0 refills | Status: DC

## 2020-11-30 NOTE — Progress Notes (Signed)
Chief Complaint:   OBESITY [name] is here to discuss his progress with his obesity treatment plan along with follow-up of his obesity related diagnoses. [name] is on the Category 3 Plan with no snacks and states he is following his eating plan approximately 25% of the time. [name] states he is not currently exercising.  Today's visit was #: 22 Starting weight: 310 lbs Starting date: 09/01/2019 Today's weight: 264 lbs Today's date: 11/30/2020 Total lbs lost to date: 46 Total lbs lost since last in-office visit: +5  Interim History: [name] hasn't been following plan much lately. Category 3 was too much food. He is here to review all recent labs.  Subjective:   1. Type 2 diabetes mellitus with other specified complication, without long-term current use of insulin (HCC) Discussed labs with patient today. [name]'s fasting insulin has improved from prior and his A1c is 6.4. His fasting blood sugars have run 160-220 lately. He is not following the meal plan and he has had increased snacking. Pt is not taking Ozempic as directed.   2. Hypertension associated with type 2 diabetes mellitus (HCC) Discussed labs with patient today. BP at goal. Pt's serum creatinine has increased from prior. Medication: clonidine, amlodipine, and losartan  BP Readings from Last 3 Encounters:  11/30/20 122/77  10/31/20 115/76  09/28/20 (!) 145/79   Lab Results  Component Value Date   CREATININE 1.29 (H) 10/31/2020   CREATININE 1.20 03/14/2020   CREATININE 1.51 (H) 11/17/2019   3. Hyperlipidemia associated with type 2 diabetes mellitus (HCC) Discussed labs with patient today. LDL almost at goal at 76 (goal <70). Medication: Lipitor  Lab Results  Component Value Date   CHOL 151 10/31/2020   HDL 53 10/31/2020   LDLCALC 76 10/31/2020   TRIG 122 10/31/2020   CHOLHDL 2.9 02/01/2020   Lab Results  Component Value Date   ALT 24 10/31/2020   AST 20 10/31/2020   ALKPHOS 84 10/31/2020   BILITOT 0.9  10/31/2020   4. Vitamin D deficiency Discussed labs with patient today. Pt has no symptoms or concerns. Level is in high normal range.  Lab Results  Component Value Date   VD25OH 84.9 10/31/2020   VD25OH 58.2 07/04/2020   VD25OH 75.1 03/14/2020   5. At risk for hyperglycemia [name] is at increased risk for hyperglycemia due to changes in diet, diagnosis of diabetes, and/or insulin use.   Assessment/Plan:  No orders of the defined types were placed in this encounter.   Medications Discontinued During This Encounter  Medication Reason   losartan (COZAAR) 50 MG tablet Reorder   Semaglutide, 2 MG/DOSE, 8 MG/3ML SOPN Reorder   Vitamin D, Ergocalciferol, (DRISDOL) 1.25 MG (50000 UNIT) CAPS capsule Reorder   glimepiride (AMARYL) 1 MG tablet Reorder     Meds ordered this encounter  Medications   glimepiride (AMARYL) 1 MG tablet    Sig: TAKE 1 TABLET BY MOUTH IN THE MORNING AND AT BEDTIME    Dispense:  60 tablet    Refill:  0   losartan (COZAAR) 50 MG tablet    Sig: Take 1 tablet (50 mg total) by mouth daily.    Dispense:  30 tablet    Refill:  0   Semaglutide, 2 MG/DOSE, 8 MG/3ML SOPN    Sig: Inject 2 mg as directed once a week.    Dispense:  3 mL    Refill:  0   Vitamin D, Ergocalciferol, (DRISDOL) 1.25 MG (50000 UNIT) CAPS capsule  Sig: Take 1 capsule (50,000 Units total) by mouth every 7 (seven) days.    Dispense:  4 capsule    Refill:  0     1. Type 2 diabetes mellitus with other specified complication, without long-term current use of insulin (HCC) Good blood sugar control is important to decrease the likelihood of diabetic complications such as nephropathy, neuropathy, limb loss, blindness, coronary artery disease, and death. Intensive lifestyle modification including diet, exercise and weight loss are the first line of treatment for diabetes. Continue current treatment plan. Refill Amaryl and Semaglutide as per below.  Refill- glimepiride (AMARYL) 1 MG tablet; TAKE  1 TABLET BY MOUTH IN THE MORNING AND AT BEDTIME  Dispense: 60 tablet; Refill: 0 Refill- Semaglutide, 2 MG/DOSE, 8 MG/3ML SOPN; Inject 2 mg as directed once a week.  Dispense: 3 mL; Refill: 0  2. Hypertension associated with type 2 diabetes mellitus (HCC) [name] is working on healthy weight loss and exercise to improve blood pressure control. We will watch for signs of hypotension as he continues his lifestyle modifications.  Refill- losartan (COZAAR) 50 MG tablet; Take 1 tablet (50 mg total) by mouth daily.  Dispense: 30 tablet; Refill: 0  3. Hyperlipidemia associated with type 2 diabetes mellitus (HCC) Cardiovascular risk and specific lipid/LDL goals reviewed.  We discussed several lifestyle modifications today and [name] will continue to work on diet, exercise and weight loss efforts. Orders and follow up as documented in patient record. Continue current treatment plan. Pt declines change in Lipitor dose.  Counseling Intensive lifestyle modifications are the first line treatment for this issue. Dietary changes: Increase soluble fiber. Decrease simple carbohydrates. Exercise changes: Moderate to vigorous-intensity aerobic activity 150 minutes per week if tolerated. Lipid-lowering medications: see documented in medical record.  4. Vitamin D deficiency Low Vitamin D level contributes to fatigue and are associated with obesity, breast, and colon cancer. As we are going into winter and pt doesn't spend much time outside, he agrees to continue to take prescription Vitamin D 50,000 IU every week and will follow-up for routine testing of Vitamin D, at least 2-3 times per year to avoid over-replacement. Recheck in 3-4 months.  Refill- Vitamin D, Ergocalciferol, (DRISDOL) 1.25 MG (50000 UNIT) CAPS capsule; Take 1 capsule (50,000 Units total) by mouth every 7 (seven) days.  Dispense: 4 capsule; Refill: 0  5. At risk for hyperglycemia [name] was given approximately 22 minutes of counseling today  regarding prevention of hyperglycemia. He was advised of hyperglycemia causes and the fact hyperglycemia is often asymptomatic. [name] was instructed to avoid skipping meals, eat regular protein rich meals and schedule low calorie but protein rich snacks as needed.   Repetitive spaced learning was employed today to elicit superior memory formation and behavioral change  6. Class 2 severe obesity with serious comorbidity and body mass index (BMI) of 36.0 to 36.9 in adult, unspecified obesity type [facility])  Loranzo is currently in the action stage of change. As such, his goal is to continue with weight loss efforts. He has agreed to change to the Category 2 Plan, per pt request.   Exercise goals:  As is  Behavioral modification strategies: decreasing simple carbohydrates, meal planning and cooking strategies, keeping healthy foods in the home, better snacking choices, and avoiding temptations.  Theadore has agreed to follow-up with our clinic in 3 weeks. He was informed of the importance of frequent follow-up visits to maximize his success with intensive lifestyle modifications for his multiple health conditions.   Objective:  Blood pressure 122/77, pulse 75, temperature (!) 97.4 F (36.3 C), height 5\' 11"  (1.803 m), weight 264 lb (119.7 kg), SpO2 99 %. Body mass index is 36.82 kg/m.  General: Cooperative, alert, well developed, in no acute distress. HEENT: Conjunctivae and lids unremarkable. Cardiovascular: Regular rhythm.  Lungs: Normal work of breathing. Neurologic: No focal deficits.   Lab Results  Component Value Date   CREATININE 1.29 (H) 10/31/2020   BUN 23 10/31/2020   NA 137 10/31/2020   K 4.8 10/31/2020   CL 100 10/31/2020   CO2 21 10/31/2020   Lab Results  Component Value Date   ALT 24 10/31/2020   AST 20 10/31/2020   ALKPHOS 84 10/31/2020   BILITOT 0.9 10/31/2020   Lab Results  Component Value Date   HGBA1C 6.4 (H) 10/31/2020   HGBA1C 6.0 (H) 07/04/2020   HGBA1C  5.6 03/14/2020   HGBA1C 5.4 11/17/2019   HGBA1C 6.6 (H) 09/01/2019   Lab Results  Component Value Date   INSULIN 9.5 10/31/2020   INSULIN 28.9 (H) 09/01/2019   Lab Results  Component Value Date   TSH 4.170 10/31/2020   Lab Results  Component Value Date   CHOL 151 10/31/2020   HDL 53 10/31/2020   LDLCALC 76 10/31/2020   TRIG 122 10/31/2020   CHOLHDL 2.9 02/01/2020   Lab Results  Component Value Date   VD25OH 84.9 10/31/2020   VD25OH 58.2 07/04/2020   VD25OH 75.1 03/14/2020   Lab Results  Component Value Date   WBC 7.9 10/31/2020   HGB 15.2 10/31/2020   HCT 44.8 10/31/2020   MCV 91 10/31/2020   PLT 285 10/31/2020    Attestation Statements:   Reviewed by clinician on day of visit: allergies, medications, problem list, medical history, surgical history, family history, social history, and previous encounter notes.  11/02/2020, CMA, am acting as transcriptionist for Edmund Hilda, DO.  I have reviewed the above documentation for accuracy and completeness, and I agree with the above. [employer], D.O.  The 21st Century Cures Act was signed into law in 2016 which includes the topic of electronic health records.  This provides immediate access to information in MyChart.  This includes consultation notes, operative notes, office notes, lab results and pathology reports.  If you have any questions about what you read please let 2017 know at your next visit so we can discuss your concerns and take corrective action if need be.  We are right here with you.

## 2020-12-06 DIAGNOSIS — G4733 Obstructive sleep apnea (adult) (pediatric): Secondary | ICD-10-CM | POA: Diagnosis not present

## 2020-12-19 DIAGNOSIS — G4733 Obstructive sleep apnea (adult) (pediatric): Secondary | ICD-10-CM | POA: Diagnosis not present

## 2020-12-19 DIAGNOSIS — E113413 Type 2 diabetes mellitus with severe nonproliferative diabetic retinopathy with macular edema, bilateral: Secondary | ICD-10-CM | POA: Diagnosis not present

## 2020-12-19 DIAGNOSIS — Z23 Encounter for immunization: Secondary | ICD-10-CM | POA: Diagnosis not present

## 2020-12-19 DIAGNOSIS — I10 Essential (primary) hypertension: Secondary | ICD-10-CM | POA: Diagnosis not present

## 2020-12-19 DIAGNOSIS — I358 Other nonrheumatic aortic valve disorders: Secondary | ICD-10-CM | POA: Diagnosis not present

## 2020-12-21 ENCOUNTER — Encounter (INDEPENDENT_AMBULATORY_CARE_PROVIDER_SITE_OTHER): Payer: Self-pay | Admitting: Family Medicine

## 2020-12-21 ENCOUNTER — Ambulatory Visit (INDEPENDENT_AMBULATORY_CARE_PROVIDER_SITE_OTHER): Payer: BC Managed Care – PPO | Admitting: Family Medicine

## 2020-12-21 ENCOUNTER — Other Ambulatory Visit: Payer: Self-pay

## 2020-12-21 VITALS — BP 136/86 | HR 80 | Temp 97.5°F | Ht 71.0 in | Wt 267.0 lb

## 2020-12-21 DIAGNOSIS — F5089 Other specified eating disorder: Secondary | ICD-10-CM | POA: Diagnosis not present

## 2020-12-21 DIAGNOSIS — E559 Vitamin D deficiency, unspecified: Secondary | ICD-10-CM

## 2020-12-21 DIAGNOSIS — Z6841 Body Mass Index (BMI) 40.0 and over, adult: Secondary | ICD-10-CM

## 2020-12-21 DIAGNOSIS — Z9189 Other specified personal risk factors, not elsewhere classified: Secondary | ICD-10-CM

## 2020-12-21 DIAGNOSIS — E1169 Type 2 diabetes mellitus with other specified complication: Secondary | ICD-10-CM

## 2020-12-21 MED ORDER — BUPROPION HCL ER (SR) 100 MG PO TB12: 100.0000 mg | ORAL_TABLET | Freq: Every day | ORAL | 0 refills | Status: DC

## 2020-12-21 MED ORDER — GLIMEPIRIDE 1 MG PO TABS: ORAL_TABLET | ORAL | 0 refills | Status: DC

## 2020-12-21 MED ORDER — VITAMIN D (ERGOCALCIFEROL) 1.25 MG (50000 UNIT) PO CAPS
50000.0000 [IU] | ORAL_CAPSULE | ORAL | 0 refills | Status: DC
Start: 2020-12-21 — End: 2021-01-30

## 2020-12-21 NOTE — Progress Notes (Signed)
Chief Complaint:   OBESITY [name] is here to discuss his progress with his obesity treatment plan along with follow-up of his obesity related diagnoses. [name] is on the Category 2 Plan and states he is following his eating plan approximately 25% of the time. [name] states he is not currently exercising.  Today's visit was #: 23 Starting weight: 310 lbs Starting date: 09/01/2019 Today's weight: 267 lbs Today's date: 12/21/2020 Total lbs lost to date: 43 Total lbs lost since last in-office visit: +3  Interim History: [name] is not following the meal plan. He is going out on weekends and "eating and drinking what I want" with friends. Blood sugars are up and pt is still not working. He is feeling down about himself lately and knows he can be doing a lot better. He is sleeping fine. Pt reports some depressed mood. Pt denies suicidal or homicidal ideations.  Subjective:   1. Other disorder of eating with emotional eating Pt is feeling depressed a little lately. He is not exercising or particularly eating healthy.  2. Type 2 diabetes mellitus with other specified complication, without long-term current use of insulin (HCC) Lain's blood sugars run 130-180's with his unhealthy eating habits. He is not using Ozempic regularly due to supply issues.  3. Vitamin D deficiency He is currently taking prescription vitamin D 50,000 IU each week. He denies nausea, vomiting or muscle weakness.  4. At risk for depression [name] is at elevated risk of depression due to one or more of the following: family history, significant life stressors, medical conditions and/or poor nutrition.  Assessment/Plan:  No orders of the defined types were placed in this encounter.   Medications Discontinued During This Encounter  Medication Reason   glimepiride (AMARYL) 1 MG tablet Reorder   Vitamin D, Ergocalciferol, (DRISDOL) 1.25 MG (50000 UNIT) CAPS capsule Reorder     Meds ordered this encounter   Medications   glimepiride (AMARYL) 1 MG tablet    Sig: TAKE 1 TABLET BY MOUTH IN THE MORNING AND AT BEDTIME    Dispense:  60 tablet    Refill:  0   Vitamin D, Ergocalciferol, (DRISDOL) 1.25 MG (50000 UNIT) CAPS capsule    Sig: Take 1 capsule (50,000 Units total) by mouth every 7 (seven) days.    Dispense:  4 capsule    Refill:  0   buPROPion ER (WELLBUTRIN SR) 100 MG 12 hr tablet    Sig: Take 1 tablet (100 mg total) by mouth daily.    Dispense:  30 tablet    Refill:  0     1. Other disorder of eating with emotional eating Everett will start low dose Wellbutrin every morning and start getting back tot he gym. Behavior modification techniques were discussed today to help [name] deal with his emotional/non-hunger eating behaviors. Orders and follow up as documented in patient record.   Start- buPROPion ER (WELLBUTRIN SR) 100 MG 12 hr tablet; Take 1 tablet (100 mg total) by mouth daily.  Dispense: 30 tablet; Refill: 0  2. Type 2 diabetes mellitus with other specified complication, without long-term current use of insulin ([name]) [name] will continue Ozempic 2 mg (has script to pick up). Good blood sugar control is important to decrease the likelihood of diabetic complications such as nephropathy, neuropathy, limb loss, blindness, coronary artery disease, and death. Intensive lifestyle modification including diet, exercise and weight loss are the first line of treatment for diabetes.   Refill- glimepiride (AMARYL) 1 MG tablet; TAKE 1  TABLET BY MOUTH IN THE MORNING AND AT BEDTIME  Dispense: 60 tablet; Refill: 0  3. Vitamin D deficiency Low Vitamin D level contributes to fatigue and are associated with obesity, breast, and colon cancer. He agrees to continue to take prescription Vitamin D 50,000 IU every week and will follow-up for routine testing of Vitamin D, at least 2-3 times per year to avoid over-replacement.  Refill- Vitamin D, Ergocalciferol, (DRISDOL) 1.25 MG (50000 UNIT) CAPS capsule;  Take 1 capsule (50,000 Units total) by mouth every 7 (seven) days.  Dispense: 4 capsule; Refill: 0  4. At risk for depression [name] was given approximately 23 minutes of depression risk counseling today. He has risk factors for depression including . We discussed the importance of a healthy work life balance, a healthy relationship with food and a good support system.  Repetitive spaced learning was employed today to elicit superior memory formation and behavioral change.   5. Obesity with current BMI of 37.3  [name] is currently in the action stage of change. As such, his goal is to continue with weight loss efforts. He has agreed to the Category 2 Plan.   Start Wellbutrin to help with mood and emotional eating.  Exercise goals:  Start back at the gym 3 days a week.  Behavioral modification strategies: increasing lean protein intake, decreasing simple carbohydrates, and planning for success.  [name] has agreed to follow-up with our clinic in 2-3 weeks. He was informed of the importance of frequent follow-up visits to maximize his success with intensive lifestyle modifications for his multiple health conditions.   Objective:   Blood pressure 136/86, pulse 80, temperature (!) 97.5 F (36.4 C), height 5\' 11"  (1.803 m), weight 267 lb (121.1 kg), SpO2 96 %. Body mass index is 37.24 kg/m.  General: Cooperative, alert, well developed, in no acute distress. HEENT: Conjunctivae and lids unremarkable. Cardiovascular: Regular rhythm.  Lungs: Normal work of breathing. Neurologic: No focal deficits.   Lab Results  Component Value Date   CREATININE 1.29 (H) 10/31/2020   BUN 23 10/31/2020   NA 137 10/31/2020   K 4.8 10/31/2020   CL 100 10/31/2020   CO2 21 10/31/2020   Lab Results  Component Value Date   ALT 24 10/31/2020   AST 20 10/31/2020   ALKPHOS 84 10/31/2020   BILITOT 0.9 10/31/2020   Lab Results  Component Value Date   HGBA1C 6.4 (H) 10/31/2020   HGBA1C 6.0 (H) 07/04/2020    HGBA1C 5.6 03/14/2020   HGBA1C 5.4 11/17/2019   HGBA1C 6.6 (H) 09/01/2019   Lab Results  Component Value Date   INSULIN 9.5 10/31/2020   INSULIN 28.9 (H) 09/01/2019   Lab Results  Component Value Date   TSH 4.170 10/31/2020   Lab Results  Component Value Date   CHOL 151 10/31/2020   HDL 53 10/31/2020   LDLCALC 76 10/31/2020   TRIG 122 10/31/2020   CHOLHDL 2.9 02/01/2020   Lab Results  Component Value Date   VD25OH 84.9 10/31/2020   VD25OH 58.2 07/04/2020   VD25OH 75.1 03/14/2020   Lab Results  Component Value Date   WBC 7.9 10/31/2020   HGB 15.2 10/31/2020   HCT 44.8 10/31/2020   MCV 91 10/31/2020   PLT 285 10/31/2020    Attestation Statements:   Reviewed by clinician on day of visit: allergies, medications, problem list, medical history, surgical history, family history, social history, and previous encounter notes.  11/02/2020, CMA, am acting as transcriptionist for Edmund Hilda,  DO.  I have reviewed the above documentation for accuracy and completeness, and I agree with the above. Marjory Sneddon, D.O.  The [place] was signed into law in 2016 which includes the topic of electronic health records.  This provides immediate access to information in MyChart.  This includes consultation notes, operative notes, office notes, lab results and pathology reports.  If you have any questions about what you read please let us know at your next visit so we can discuss your concerns and take corrective action if need be.  We are right here with you.

## 2021-01-01 ENCOUNTER — Other Ambulatory Visit (INDEPENDENT_AMBULATORY_CARE_PROVIDER_SITE_OTHER): Payer: Self-pay | Admitting: Family Medicine

## 2021-01-01 DIAGNOSIS — I152 Hypertension secondary to endocrine disorders: Secondary | ICD-10-CM

## 2021-01-03 ENCOUNTER — Other Ambulatory Visit (INDEPENDENT_AMBULATORY_CARE_PROVIDER_SITE_OTHER): Payer: Self-pay | Admitting: Family Medicine

## 2021-01-03 MED ORDER — LOSARTAN POTASSIUM 50 MG PO TABS: 50.0000 mg | ORAL_TABLET | Freq: Every day | ORAL | 0 refills | Status: DC

## 2021-01-09 ENCOUNTER — Ambulatory Visit (INDEPENDENT_AMBULATORY_CARE_PROVIDER_SITE_OTHER): Payer: BC Managed Care – PPO | Admitting: Family Medicine

## 2021-01-09 ENCOUNTER — Other Ambulatory Visit (HOSPITAL_COMMUNITY): Payer: Self-pay

## 2021-01-09 ENCOUNTER — Other Ambulatory Visit: Payer: Self-pay

## 2021-01-09 ENCOUNTER — Encounter (INDEPENDENT_AMBULATORY_CARE_PROVIDER_SITE_OTHER): Payer: Self-pay | Admitting: Family Medicine

## 2021-01-09 VITALS — BP 131/76 | HR 87 | Temp 97.7°F | Ht 71.0 in | Wt 269.0 lb

## 2021-01-09 DIAGNOSIS — Z6841 Body Mass Index (BMI) 40.0 and over, adult: Secondary | ICD-10-CM

## 2021-01-09 DIAGNOSIS — Z9189 Other specified personal risk factors, not elsewhere classified: Secondary | ICD-10-CM | POA: Diagnosis not present

## 2021-01-09 DIAGNOSIS — E559 Vitamin D deficiency, unspecified: Secondary | ICD-10-CM | POA: Diagnosis not present

## 2021-01-09 DIAGNOSIS — E1169 Type 2 diabetes mellitus with other specified complication: Secondary | ICD-10-CM

## 2021-01-09 DIAGNOSIS — F5089 Other specified eating disorder: Secondary | ICD-10-CM

## 2021-01-09 MED ORDER — BUPROPION HCL ER (SR) 100 MG PO TB12: 100.0000 mg | ORAL_TABLET | Freq: Every day | ORAL | 0 refills | Status: DC

## 2021-01-09 MED ORDER — TIRZEPATIDE 10 MG/0.5ML ~~LOC~~ SOAJ
10.0000 mg | SUBCUTANEOUS | 0 refills | Status: DC
  Filled 2021-01-09: qty 2, 28d supply, fill #0

## 2021-01-09 NOTE — Progress Notes (Signed)
Chief Complaint:   OBESITY [name] is here to discuss his progress with his obesity treatment plan along with follow-up of his obesity related diagnoses. [name] is on the Category 2 Plan and states he is following his eating plan approximately 10% of the time. [name] states he is not currently exercising.  Today's visit was #: 24 Starting weight: 310 lbs Starting date: 09/01/2019 Today's weight: 269 lbs Today's date: 01/09/2021 Total lbs lost to date: 41 Total lbs lost since last in-office visit: +2  Interim History: [name] is here for a follow up office visit. We reviewed his meal plan and questions were answered. Patient's food recall appears to be accurate and consistent with what is on plan when he is following it. When eating on plan, his hunger and cravings are well controlled.   Pt is ready to make changes and get back on plan. He's worried about his blood sugars.  Subjective:   1. Type 2 diabetes mellitus with other specified complication, without long-term current use of insulin (HCC) [name]'s fasting blood sugars have not been good, running 170-180's.  2. Other disorder of eating with emotional eating Pt started Wellbutrin at last OV and is tolerating it well with no side effects. He is not sure if it is helping yet or not.  3. Vitamin D deficiency He is currently taking prescription vitamin D 50,000 IU each week. He denies nausea, vomiting or muscle weakness.  4. At risk for hyperglycemia [name] is at risk for hyperglycemia due to poorly controlled blood sugars.  Assessment/Plan:  No orders of the defined types were placed in this encounter.   Medications Discontinued During This Encounter  Medication Reason   buPROPion ER (WELLBUTRIN SR) 100 MG 12 hr tablet Reorder   Semaglutide, 2 MG/DOSE, 8 MG/3ML SOPN      Meds ordered this encounter  Medications   buPROPion ER (WELLBUTRIN SR) 100 MG 12 hr tablet    Sig: Take 1 tablet (100 mg total) by mouth  daily.    Dispense:  30 tablet    Refill:  0   tirzepatide (MOUNJARO) 10 MG/0.5ML Pen    Sig: Inject 10 mg into the skin once a week.    Dispense:  2 mL    Refill:  0     1. Type 2 diabetes mellitus with other specified complication, without long-term current use of insulin ([place]) [name] will change from Ozempic 2 mg to Mounjaro 10 mg weekly. Continue all other medications, focus on prudent nutritional plan, and increase exercise/activity.  Change to- tirzepatide [facility]) 10 MG/0.5ML Pen; Inject 10 mg into the skin once a week.  Dispense: 2 mL; Refill: 0  2. Other disorder of eating with emotional eating Behavior modification techniques were discussed today to help [name] deal with his emotional/non-hunger eating behaviors.  Orders and follow up as documented in patient record.   Refill- buPROPion ER (WELLBUTRIN SR) 100 MG 12 hr tablet; Take 1 tablet (100 mg total) by mouth daily.  Dispense: 30 tablet; Refill: 0  3. Vitamin D deficiency Low Vitamin D level contributes to fatigue and are associated with obesity, breast, and colon cancer. He agrees to continue to take prescription Vitamin D 50,000 IU every week and will follow-up for routine testing of Vitamin D, at least 2-3 times per year to avoid over-replacement.  4. At risk for hyperglycemia [name] was given approximately 9 minutes of counseling today regarding prevention of hyperglycemia. He was advised of hyperglycemia causes and the  fact hyperglycemia is often asymptomatic. [name] was instructed to avoid skipping meals, eat regular protein rich meals and schedule low calorie but protein rich snacks as needed.   Repetitive spaced learning was employed today to elicit superior memory formation and behavioral change  5. Obesity with current BMI of 37.5  [name] is currently in the action stage of change. As such, his goal is to continue with weight loss efforts. He has agreed to the Category 2 Plan.   Exercise goals: All adults  should avoid inactivity. Some physical activity is better than none, and adults who participate in any amount of physical activity gain some health benefits.  Behavioral modification strategies: meal planning and cooking strategies and planning for success.  [name] has agreed to follow-up with our clinic in 3 weeks. He was informed of the importance of frequent follow-up visits to maximize his success with intensive lifestyle modifications for his multiple health conditions.   Objective:   Blood pressure 131/76, pulse 87, temperature 97.7 F (36.5 C), height 5\' 11"  (1.803 m), weight 269 lb (122 kg), SpO2 98 %. Body mass index is 37.52 kg/m.  General: Cooperative, alert, well developed, in no acute distress. HEENT: Conjunctivae and lids unremarkable. Cardiovascular: Regular rhythm.  Lungs: Normal work of breathing. Neurologic: No focal deficits.   Lab Results  Component Value Date   CREATININE 1.29 (H) 10/31/2020   BUN 23 10/31/2020   NA 137 10/31/2020   K 4.8 10/31/2020   CL 100 10/31/2020   CO2 21 10/31/2020   Lab Results  Component Value Date   ALT 24 10/31/2020   AST 20 10/31/2020   ALKPHOS 84 10/31/2020   BILITOT 0.9 10/31/2020   Lab Results  Component Value Date   HGBA1C 6.4 (H) 10/31/2020   HGBA1C 6.0 (H) 07/04/2020   HGBA1C 5.6 03/14/2020   HGBA1C 5.4 11/17/2019   HGBA1C 6.6 (H) 09/01/2019   Lab Results  Component Value Date   INSULIN 9.5 10/31/2020   INSULIN 28.9 (H) 09/01/2019   Lab Results  Component Value Date   TSH 4.170 10/31/2020   Lab Results  Component Value Date   CHOL 151 10/31/2020   HDL 53 10/31/2020   LDLCALC 76 10/31/2020   TRIG 122 10/31/2020   CHOLHDL 2.9 02/01/2020   Lab Results  Component Value Date   VD25OH 84.9 10/31/2020   VD25OH 58.2 07/04/2020   VD25OH 75.1 03/14/2020   Lab Results  Component Value Date   WBC 7.9 10/31/2020   HGB 15.2 10/31/2020   HCT 44.8 10/31/2020   MCV 91 10/31/2020   PLT 285 10/31/2020     Attestation Statements:   Reviewed by clinician on day of visit: allergies, medications, problem list, medical history, surgical history, family history, social history, and previous encounter notes.  11/02/2020, CMA, am acting as transcriptionist for Edmund Hilda, DO.  I have reviewed the above documentation for accuracy and completeness, and I agree with the above. [employer], D.O.  The 21st Century Cures Act was signed into law in 2016 which includes the topic of electronic health records.  This provides immediate access to information in MyChart.  This includes consultation notes, operative notes, office notes, lab results and pathology reports.  If you have any questions about what you read please let 2017 know at your next visit so we can discuss your concerns and take corrective action if need be.  We are right here with you.

## 2021-01-10 ENCOUNTER — Other Ambulatory Visit (HOSPITAL_COMMUNITY): Payer: Self-pay

## 2021-01-25 ENCOUNTER — Other Ambulatory Visit: Payer: Self-pay | Admitting: Internal Medicine

## 2021-01-30 ENCOUNTER — Ambulatory Visit (INDEPENDENT_AMBULATORY_CARE_PROVIDER_SITE_OTHER): Payer: BC Managed Care – PPO | Admitting: Family Medicine

## 2021-01-30 ENCOUNTER — Other Ambulatory Visit (HOSPITAL_COMMUNITY): Payer: Self-pay

## 2021-01-30 ENCOUNTER — Encounter (INDEPENDENT_AMBULATORY_CARE_PROVIDER_SITE_OTHER): Payer: Self-pay | Admitting: Family Medicine

## 2021-01-30 ENCOUNTER — Other Ambulatory Visit: Payer: Self-pay

## 2021-01-30 VITALS — BP 135/73 | HR 91 | Temp 97.6°F | Ht 71.0 in | Wt 270.0 lb

## 2021-01-30 DIAGNOSIS — Z6841 Body Mass Index (BMI) 40.0 and over, adult: Secondary | ICD-10-CM

## 2021-01-30 DIAGNOSIS — E1169 Type 2 diabetes mellitus with other specified complication: Secondary | ICD-10-CM

## 2021-01-30 DIAGNOSIS — F5089 Other specified eating disorder: Secondary | ICD-10-CM | POA: Diagnosis not present

## 2021-01-30 DIAGNOSIS — E559 Vitamin D deficiency, unspecified: Secondary | ICD-10-CM | POA: Diagnosis not present

## 2021-01-30 DIAGNOSIS — Z9189 Other specified personal risk factors, not elsewhere classified: Secondary | ICD-10-CM | POA: Diagnosis not present

## 2021-01-30 MED ORDER — GLIMEPIRIDE 1 MG PO TABS: ORAL_TABLET | ORAL | 0 refills | Status: DC

## 2021-01-30 MED ORDER — TIRZEPATIDE 12.5 MG/0.5ML ~~LOC~~ SOAJ
12.5000 mg | SUBCUTANEOUS | 0 refills | Status: DC
  Filled 2021-01-30: qty 2, 28d supply, fill #0

## 2021-01-30 MED ORDER — BUPROPION HCL ER (SR) 150 MG PO TB12
150.0000 mg | ORAL_TABLET | Freq: Every day | ORAL | 0 refills | Status: DC
Start: 2021-01-30 — End: 2021-02-22

## 2021-01-30 MED ORDER — VITAMIN D (ERGOCALCIFEROL) 1.25 MG (50000 UNIT) PO CAPS: 50000.0000 [IU] | ORAL_CAPSULE | ORAL | 0 refills | Status: DC

## 2021-01-31 NOTE — Progress Notes (Signed)
Chief Complaint:   OBESITY [name] is here to discuss his progress with his obesity treatment plan along with follow-up of his obesity related diagnoses. [name] is on the Category 2 Plan and states he is following his eating plan approximately 40% of the time. [name] states he is not currently exercising.  Today's visit was #: 25 Starting weight: 310 lbs Starting date: 09/01/2019 Today's weight: 270 lbs Today's date: 01/30/2021 Total lbs lost to date: 40 Total lbs lost since last in-office visit: 0  Interim History: [name] notes that Monday-Thursday he does great and Friday-Sunday he "falls off the wagon", because his girlfriend is making a lot of sweets.   Subjective:   1. Type 2 diabetes mellitus with other specified complication, without long-term current use of insulin (HCC) [name]'s morning blood sugar was 158 after eating over the weekend and yesterday. He took [name] for the first time last week (out of Ozempic) and he is tolerating it well with no side effects.   2. Vitamin D deficiency [name] is currently taking prescription vitamin D 50,000 IU each week. He denies nausea, vomiting or muscle weakness.  3. Other disorder of eating with emotional eating [name] notes he is less depressed. He started Wellbutrin in early November and he is tolerating it well.  4. At risk for side effect of medication [name] is at risk for drug side effects due to increased dose of Mounjaro.  Assessment/Plan:  No orders of the defined types were placed in this encounter.   Medications Discontinued During This Encounter  Medication Reason   glimepiride (AMARYL) 1 MG tablet Reorder   Vitamin D, Ergocalciferol, (DRISDOL) 1.25 MG (50000 UNIT) CAPS capsule Reorder     Meds ordered this encounter  Medications   glimepiride (AMARYL) 1 MG tablet    Sig: TAKE 1 TABLET BY MOUTH IN THE MORNING AND AT BEDTIME    Dispense:  60 tablet    Refill:  0   Vitamin D, Ergocalciferol, (DRISDOL) 1.25  MG (50000 UNIT) CAPS capsule    Sig: Take 1 capsule (50,000 Units total) by mouth every 7 (seven) days.    Dispense:  4 capsule    Refill:  0   tirzepatide (MOUNJARO) 12.5 MG/0.5ML Pen    Sig: Inject 12.5 mg into the skin once a week.    Dispense:  2 mL    Refill:  0   buPROPion (WELLBUTRIN SR) 150 MG 12 hr tablet    Sig: Take 1 tablet (150 mg total) by mouth daily.    Dispense:  30 tablet    Refill:  0     1. Type 2 diabetes mellitus with other specified complication, without long-term current use of insulin (HCC) [name] agreed to increase Mounjaro to 12.5 mg weekly (up from 10 mg), and we will refill for 1 month; we will refill Amaryl for 1 month. He is to check his fasting blood sugars Saturday morning-Monday mornings, while he is not eating as good so he knows how bad  or good his blood sugars are.  - glimepiride (AMARYL) 1 MG tablet; TAKE 1 TABLET BY MOUTH IN THE MORNING AND AT BEDTIME  Dispense: 60 tablet; Refill: 0 - tirzepatide (MOUNJARO) 12.5 MG/0.5ML Pen; Inject 12.5 mg into the skin once a week.  Dispense: 2 mL; Refill: 0  2. Vitamin D deficiency We will refill prescription Vitamin D for 1 month. [name] will follow-up for routine testing of Vitamin D, at least 2-3 times per year to avoid over-replacement.  -  Vitamin D, Ergocalciferol, (DRISDOL) 1.25 MG (50000 UNIT) CAPS capsule; Take 1 capsule (50,000 Units total) by mouth every 7 (seven) days.  Dispense: 4 capsule; Refill: 0  3. Other disorder of eating with emotional eating [name] agreed to increase Wellbutrin to 150 mg daily (up from 100 mg), and we will refill for 1 month. He will start walking and doing self-care activities.  - buPROPion (WELLBUTRIN SR) 150 MG 12 hr tablet; Take 1 tablet (150 mg total) by mouth daily.  Dispense: 30 tablet; Refill: 0  4. At risk for side effect of medication [name] was given approximately 9 minutes of drug side effect counseling today.  We discussed side effect possibility and risk  versus benefits. [name] agreed to the medication and will contact this office if these side effects are intolerable.  Repetitive spaced learning was employed today to elicit superior memory formation and behavioral change.  5. Obesity with current BMI of 37.8 [name] is currently in the action stage of change. As such, his goal is to continue with weight loss efforts. He has agreed to the Category 1 Plan.   [name]'s goal is to not gain weight over the holidays. He will continue to eat on the meal plan Monday-Thursday, and he is to check his blood sugars over the weekend.  Exercise goals: Start walking.  Behavioral modification strategies: increasing lean protein intake, holiday eating strategies (handout was given), and celebration eating strategies.  [name] has agreed to follow-up with our clinic in 4 weeks. He was informed of the importance of frequent follow-up visits to maximize his success with intensive lifestyle modifications for his multiple health conditions.   Objective:   Blood pressure 135/73, pulse 91, temperature 97.6 F (36.4 C), height 5\' 11"  (1.803 m), weight 270 lb (122.5 kg), SpO2 99 %. Body mass index is 37.66 kg/m.  General: Cooperative, alert, well developed, in no acute distress. HEENT: Conjunctivae and lids unremarkable. Cardiovascular: Regular rhythm.  Lungs: Normal work of breathing. Neurologic: No focal deficits.   Lab Results  Component Value Date   CREATININE 1.29 (H) 10/31/2020   BUN 23 10/31/2020   NA 137 10/31/2020   K 4.8 10/31/2020   CL 100 10/31/2020   CO2 21 10/31/2020   Lab Results  Component Value Date   ALT 24 10/31/2020   AST 20 10/31/2020   ALKPHOS 84 10/31/2020   BILITOT 0.9 10/31/2020   Lab Results  Component Value Date   HGBA1C 6.4 (H) 10/31/2020   HGBA1C 6.0 (H) 07/04/2020   HGBA1C 5.6 03/14/2020   HGBA1C 5.4 11/17/2019   HGBA1C 6.6 (H) 09/01/2019   Lab Results  Component Value Date   INSULIN 9.5 10/31/2020   INSULIN  28.9 (H) 09/01/2019   Lab Results  Component Value Date   TSH 4.170 10/31/2020   Lab Results  Component Value Date   CHOL 151 10/31/2020   HDL 53 10/31/2020   LDLCALC 76 10/31/2020   TRIG 122 10/31/2020   CHOLHDL 2.9 02/01/2020   Lab Results  Component Value Date   VD25OH 84.9 10/31/2020   VD25OH 58.2 07/04/2020   VD25OH 75.1 03/14/2020   Lab Results  Component Value Date   WBC 7.9 10/31/2020   HGB 15.2 10/31/2020   HCT 44.8 10/31/2020   MCV 91 10/31/2020   PLT 285 10/31/2020   No results found for: IRON, TIBC, FERRITIN  Attestation Statements:   Reviewed by clinician on day of visit: allergies, medications, problem list, medical history, surgical history, family history, social history,  and previous encounter notes.   Wilhemena Durie, am acting as transcriptionist for [employer], DO.  I have reviewed the above documentation for accuracy and completeness, and I agree with the above. Marjory Sneddon, D.O.  The [place] was signed into law in 2016 which includes the topic of electronic health records.  This provides immediate access to information in MyChart.  This includes consultation notes, operative notes, office notes, lab results and pathology reports.  If you have any questions about what you read please let us know at your next visit so we can discuss your concerns and take corrective action if need be.  We are right here with you.

## 2021-02-02 ENCOUNTER — Other Ambulatory Visit (HOSPITAL_COMMUNITY): Payer: Self-pay

## 2021-02-05 DIAGNOSIS — G4733 Obstructive sleep apnea (adult) (pediatric): Secondary | ICD-10-CM | POA: Diagnosis not present

## 2021-02-07 ENCOUNTER — Other Ambulatory Visit (HOSPITAL_COMMUNITY): Payer: Self-pay

## 2021-02-22 ENCOUNTER — Ambulatory Visit (INDEPENDENT_AMBULATORY_CARE_PROVIDER_SITE_OTHER): Payer: BC Managed Care – PPO | Admitting: Family Medicine

## 2021-02-22 ENCOUNTER — Other Ambulatory Visit: Payer: Self-pay

## 2021-02-22 ENCOUNTER — Encounter (INDEPENDENT_AMBULATORY_CARE_PROVIDER_SITE_OTHER): Payer: Self-pay | Admitting: Family Medicine

## 2021-02-22 VITALS — BP 122/78 | HR 84 | Temp 97.6°F | Ht 71.0 in | Wt 267.0 lb

## 2021-02-22 DIAGNOSIS — E66813 Obesity, class 3: Secondary | ICD-10-CM

## 2021-02-22 DIAGNOSIS — E559 Vitamin D deficiency, unspecified: Secondary | ICD-10-CM

## 2021-02-22 DIAGNOSIS — F5089 Other specified eating disorder: Secondary | ICD-10-CM | POA: Diagnosis not present

## 2021-02-22 DIAGNOSIS — Z9189 Other specified personal risk factors, not elsewhere classified: Secondary | ICD-10-CM | POA: Diagnosis not present

## 2021-02-22 DIAGNOSIS — N183 Chronic kidney disease, stage 3 unspecified: Secondary | ICD-10-CM

## 2021-02-22 DIAGNOSIS — E1159 Type 2 diabetes mellitus with other circulatory complications: Secondary | ICD-10-CM | POA: Diagnosis not present

## 2021-02-22 DIAGNOSIS — I152 Hypertension secondary to endocrine disorders: Secondary | ICD-10-CM

## 2021-02-22 DIAGNOSIS — E1122 Type 2 diabetes mellitus with diabetic chronic kidney disease: Secondary | ICD-10-CM

## 2021-02-22 DIAGNOSIS — E1169 Type 2 diabetes mellitus with other specified complication: Secondary | ICD-10-CM | POA: Diagnosis not present

## 2021-02-22 DIAGNOSIS — Z6837 Body mass index (BMI) 37.0-37.9, adult: Secondary | ICD-10-CM

## 2021-02-22 MED ORDER — BUPROPION HCL ER (SR) 150 MG PO TB12: 150.0000 mg | ORAL_TABLET | Freq: Every day | ORAL | 0 refills | Status: DC

## 2021-02-22 MED ORDER — GLIMEPIRIDE 2 MG PO TABS: ORAL_TABLET | ORAL | 0 refills | Status: DC

## 2021-02-22 MED ORDER — LOSARTAN POTASSIUM 50 MG PO TABS: 50.0000 mg | ORAL_TABLET | Freq: Every day | ORAL | 0 refills | Status: DC

## 2021-02-22 MED ORDER — VITAMIN D (ERGOCALCIFEROL) 1.25 MG (50000 UNIT) PO CAPS: 50000.0000 [IU] | ORAL_CAPSULE | ORAL | 0 refills | Status: DC

## 2021-02-23 ENCOUNTER — Other Ambulatory Visit: Payer: Self-pay | Admitting: Internal Medicine

## 2021-02-23 LAB — BASIC METABOLIC PANEL
BUN/Creatinine Ratio: 16 (ref 9–20)
BUN: 27 mg/dL — ABNORMAL HIGH (ref 6–24)
CO2: 22 mmol/L (ref 20–29)
Calcium: 10.1 mg/dL (ref 8.7–10.2)
Chloride: 99 mmol/L (ref 96–106)
Creatinine, Ser: 1.67 mg/dL — ABNORMAL HIGH (ref 0.76–1.27)
Glucose: 94 mg/dL (ref 70–99)
Potassium: 4.8 mmol/L (ref 3.5–5.2)
Sodium: 139 mmol/L (ref 134–144)
eGFR: 47 mL/min/{1.73_m2} — ABNORMAL LOW (ref >59)

## 2021-02-23 LAB — HEMOGLOBIN A1C
Est. average glucose Bld gHb Est-mCnc: 151 mg/dL
Hgb A1c MFr Bld: 6.9 % — ABNORMAL HIGH (ref 4.8–5.6)

## 2021-02-23 LAB — VITAMIN D 25 HYDROXY (VIT D DEFICIENCY, FRACTURES): Vit D, 25-Hydroxy: 83.5 ng/mL (ref 30.0–100.0)

## 2021-02-23 NOTE — Progress Notes (Signed)
Chief Complaint:   OBESITY [name] is here to discuss his progress with his obesity treatment plan along with follow-up of his obesity related diagnoses. Yahshua is on the Category 1 Plan and states he is following his eating plan approximately 40% of the time. Inmar states he is walking on treadmill and weight training 30-60 minutes a couple of times per week.  Today's visit was #: 24 Starting weight: 310 lbs Starting date: 09/01/2019 Today's weight: 267 lbs Today's date: 02/22/2021 Total lbs lost to date: 43 Total lbs lost since last in-office visit: 3  Interim History: Pt started at gym yesterday and today. He follows plan M-Th, but he "goes hog wild" and does what he wants. Pt drinks alcohol and does off plan eating. He's already lost a toe and he is worried about his BS's.  Subjective:   1. chronic renal impairment associated with type 2 diabetes mellitus (HCC) FBS= 150-180. Lowest level 130 and highest 320. Pt is on 10 mg Mounjaro and is getting ready to start 12.5 mg this Friday. He was on 2 mg Amaryl in the past, which pt felt really helped bring BS's down.  2. Vitamin D deficiency He is currently taking prescription vitamin D 50,000 IU each week. He denies nausea, vomiting or muscle weakness.  3. Hypertension associated with type 2 diabetes mellitus ([place]) Dicie Beam is tolerating medication(s) well without side effects.  Medication compliance is good and patient appears to be taking it as prescribed.  Denies additional concerns regarding this condition. Medication: Norvasc, Catapres, Cozaar  4. Other disorder of eating with emotional eating Pt is doing well with his emotional eating on current dose of Wellbutrin. He is tolerating meds well and feels it is working well. Pt denies need for change in tx plan.  Assessment/Plan:   Orders Placed This Encounter  Procedures   Basic metabolic panel   Hemoglobin A1c   VITAMIN D 25 Hydroxy (Vit-D Deficiency, Fractures)     Medications Discontinued During This Encounter  Medication Reason   buPROPion ER (WELLBUTRIN SR) 100 MG 12 hr tablet    tirzepatide (MOUNJARO) 10 MG/0.5ML Pen    losartan (COZAAR) 50 MG tablet Reorder   glimepiride (AMARYL) 1 MG tablet Reorder   Vitamin D, Ergocalciferol, (DRISDOL) 1.25 MG (50000 UNIT) CAPS capsule Reorder   buPROPion (WELLBUTRIN SR) 150 MG 12 hr tablet Reorder     Meds ordered this encounter  Medications   buPROPion (WELLBUTRIN SR) 150 MG 12 hr tablet    Sig: Take 1 tablet (150 mg total) by mouth daily.    Dispense:  30 tablet    Refill:  0   glimepiride (AMARYL) 2 MG tablet    Sig: TAKE 1 TABLET BY MOUTH IN THE MORNING AND AT BEDTIME    Dispense:  60 tablet    Refill:  0   losartan (COZAAR) 50 MG tablet    Sig: Take 1 tablet (50 mg total) by mouth daily.    Dispense:  30 tablet    Refill:  0   Vitamin D, Ergocalciferol, (DRISDOL) 1.25 MG (50000 UNIT) CAPS capsule    Sig: Take 1 capsule (50,000 Units total) by mouth every 7 (seven) days.    Dispense:  4 capsule    Refill:  0     1. chronic renal impairment associated with type 2 diabetes mellitus ([place]) Check labs today. Increase Amaryl to 2 mg BID (up from 1 mg). Pt has Rx for Mounjaro 12.5 mg and  will start this Friday. Continue Metformin.  - Basic metabolic panel - Hemoglobin A1c  2. Vitamin D deficiency Low Vitamin D level contributes to fatigue and are associated with obesity, breast, and colon cancer. He agrees to continue to take prescription Vitamin D 50,000 IU every week and will follow-up for routine testing of Vitamin D, at least 2-3 times per year to avoid over-replacement. Check labs today. Check labs today.  Refill- Vitamin D, Ergocalciferol, (DRISDOL) 1.25 MG (50000 UNIT) CAPS capsule; Take 1 capsule (50,000 Units total) by mouth every 7 (seven) days.  Dispense: 4 capsule; Refill: 0  - VITAMIN D 25 Hydroxy (Vit-D Deficiency, Fractures)  3. Hypertension associated with type 2 diabetes  mellitus ([place]) Arad is working on healthy weight loss and exercise to improve blood pressure control. We will watch for signs of hypotension as he continues his lifestyle modifications. Check labs today.  Refill- glimepiride (AMARYL) 2 MG tablet; TAKE 1 TABLET BY MOUTH IN THE MORNING AND AT BEDTIME  Dispense: 60 tablet; Refill: 0 Refill- losartan (COZAAR) 50 MG tablet; Take 1 tablet (50 mg total) by mouth daily.  Dispense: 30 tablet; Refill: 0  - Hemoglobin A1c  4. Other disorder of eating with emotional eating Behavior modification techniques were discussed today to help Payton deal with his emotional/non-hunger eating behaviors.  Orders and follow up as documented in patient record.   Refill- buPROPion (WELLBUTRIN SR) 150 MG 12 hr tablet; Take 1 tablet (150 mg total) by mouth daily.  Dispense: 30 tablet; Refill: 0  5. At risk for hyperglycemia Kovin was given approximately 9 minutes of counseling today regarding detriments to health with hyperglycemia.  He was advised of symptoms of hyperglycemia.  Malakiah was instructed to avoid skipping proteins in meals and eating only carb-rich foods.  The patient should be checking FBS, 2 hr PP BS's and also any time they do not feel well- especially with new onset nausea/ vomiting and abdominal pain, excessive thirst or hunger, tachycardia etc.  Importance of regular follow-ups with their PCP or Endocrinologist, in addition to our visits was stressed to patient.  6. Obesity with current BMI of 37.2  Flemming is currently in the action stage of change. As such, his goal is to continue with weight loss efforts. He has agreed to the Category 1 Plan.   Exercise goals:  As is- Increase to 150 minutes per week as tolerated.  Behavioral modification strategies: decreasing simple carbohydrates, increasing water intake, meal planning and cooking strategies, better snacking choices, and avoiding temptations.  Yorick has agreed to follow-up with our clinic in 3  weeks. He was informed of the importance of frequent follow-up visits to maximize his success with intensive lifestyle modifications for his multiple health conditions.   Shanon was informed we would discuss his lab results at his next visit unless there is a critical issue that needs to be addressed sooner. Samrat agreed to keep his next visit at the agreed upon time to discuss these results.  Objective:   Blood pressure 122/78, pulse 84, temperature 97.6 F (36.4 C), height 5\' 11"  (1.803 m), weight 267 lb (121.1 kg), SpO2 98 %. Body mass index is 37.24 kg/m.  General: Cooperative, alert, well developed, in no acute distress. HEENT: Conjunctivae and lids unremarkable. Cardiovascular: Regular rhythm.  Lungs: Normal work of breathing. Neurologic: No focal deficits.   Lab Results  Component Value Date   CREATININE 1.67 (H) 02/22/2021   BUN 27 (H) 02/22/2021   NA 139 02/22/2021   K  4.8 02/22/2021   CL 99 02/22/2021   CO2 22 02/22/2021   Lab Results  Component Value Date   ALT 24 10/31/2020   AST 20 10/31/2020   ALKPHOS 84 10/31/2020   BILITOT 0.9 10/31/2020   Lab Results  Component Value Date   HGBA1C 6.9 (H) 02/22/2021   HGBA1C 6.4 (H) 10/31/2020   HGBA1C 6.0 (H) 07/04/2020   HGBA1C 5.6 03/14/2020   HGBA1C 5.4 11/17/2019   Lab Results  Component Value Date   INSULIN 9.5 10/31/2020   INSULIN 28.9 (H) 09/01/2019   Lab Results  Component Value Date   TSH 4.170 10/31/2020   Lab Results  Component Value Date   CHOL 151 10/31/2020   HDL 53 10/31/2020   LDLCALC 76 10/31/2020   TRIG 122 10/31/2020   CHOLHDL 2.9 02/01/2020   Lab Results  Component Value Date   VD25OH 83.5 02/22/2021   VD25OH 84.9 10/31/2020   VD25OH 58.2 07/04/2020   Lab Results  Component Value Date   WBC 7.9 10/31/2020   HGB 15.2 10/31/2020   HCT 44.8 10/31/2020   MCV 91 10/31/2020   PLT 285 10/31/2020   Attestation Statements:   Reviewed by clinician on day of visit: allergies,  medications, problem list, medical history, surgical history, family history, social history, and previous encounter notes.  Coral Ceo, CMA, am acting as transcriptionist for [employer], DO.  I have reviewed the above documentation for accuracy and completeness, and I agree with the above. Marjory Sneddon, D.O.  The [place] was signed into law in 2016 which includes the topic of electronic health records.  This provides immediate access to information in MyChart.  This includes consultation notes, operative notes, office notes, lab results and pathology reports.  If you have any questions about what you read please let us know at your next visit so we can discuss your concerns and take corrective action if need be.  We are right here with you.

## 2021-02-28 ENCOUNTER — Encounter: Payer: Self-pay | Admitting: Sports Medicine

## 2021-02-28 ENCOUNTER — Ambulatory Visit: Payer: BC Managed Care – PPO | Admitting: Sports Medicine

## 2021-02-28 ENCOUNTER — Other Ambulatory Visit: Payer: Self-pay

## 2021-02-28 DIAGNOSIS — M25372 Other instability, left ankle: Secondary | ICD-10-CM | POA: Diagnosis not present

## 2021-02-28 DIAGNOSIS — S90822D Blister (nonthermal), left foot, subsequent encounter: Secondary | ICD-10-CM

## 2021-02-28 DIAGNOSIS — E1140 Type 2 diabetes mellitus with diabetic neuropathy, unspecified: Secondary | ICD-10-CM

## 2021-02-28 DIAGNOSIS — M14679 Charcot's joint, unspecified ankle and foot: Secondary | ICD-10-CM

## 2021-02-28 DIAGNOSIS — I359 Nonrheumatic aortic valve disorder, unspecified: Secondary | ICD-10-CM | POA: Insufficient documentation

## 2021-02-28 DIAGNOSIS — B351 Tinea unguium: Secondary | ICD-10-CM

## 2021-02-28 DIAGNOSIS — Z89421 Acquired absence of other right toe(s): Secondary | ICD-10-CM
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
## 2021-03-06 ENCOUNTER — Other Ambulatory Visit: Payer: Self-pay

## 2021-03-06 ENCOUNTER — Ambulatory Visit (INDEPENDENT_AMBULATORY_CARE_PROVIDER_SITE_OTHER): Payer: BC Managed Care – PPO

## 2021-03-06 DIAGNOSIS — M25372 Other instability, left ankle: Secondary | ICD-10-CM | POA: Diagnosis not present

## 2021-03-06 DIAGNOSIS — Z89421 Acquired absence of other right toe(s): Secondary | ICD-10-CM

## 2021-03-06 DIAGNOSIS — E1140 Type 2 diabetes mellitus with diabetic neuropathy, unspecified: Secondary | ICD-10-CM

## 2021-03-06 DIAGNOSIS — M14679 Charcot's joint, unspecified ankle and foot: Secondary | ICD-10-CM | POA: Diagnosis not present

## 2021-03-07 ENCOUNTER — Telehealth: Payer: Self-pay

## 2021-03-07 ENCOUNTER — Telehealth: Payer: Self-pay | Admitting: Sports Medicine

## 2021-03-07 DIAGNOSIS — G4733 Obstructive sleep apnea (adult) (pediatric): Secondary | ICD-10-CM | POA: Diagnosis not present

## 2021-03-07 NOTE — Telephone Encounter (Signed)
Per Arville Go J @ [facility] [place] insurance the orthotics [phone]) is valid and billable and no Josem Kaufmann is required. Covered @ 60% no deductible applies.Marland Kitchen out of pocket is 975.00(met 57.98) reference # [id number]

## 2021-03-07 NOTE — Patient Instructions (Signed)
Patient to be called when foot orthotics are ready.

## 2021-03-07 NOTE — Telephone Encounter (Signed)
Pt left message yesterday asking about the orthotics he was cast for yesterday. He has some questions and would like his insurance checked before ordering. And he maybe changing insurances would that effect anything.  I asked Aaron Edelman to hold the impression box and notified pt I would call insurance asap and let him know before we proceed with ordering.

## 2021-03-07 NOTE — Progress Notes (Signed)
SITUATION Reason for Consult: Evaluation for Bilateral Custom Foot Orthoses Patient / Caregiver Report: Patient does not want a large bulky solution  OBJECTIVE DATA: Patient History / Diagnosis:    ICD-10-CM   1. Type 2 diabetes mellitus with diabetic neuropathy, without long-term current use of insulin (HCC)  E11.40     2. Charcot's joint of foot, unspecified laterality  M14.679     3. History of amputation of lesser toe of right foot (HCC)  Z89.421       Current or Previous Devices: Historical bracing  Foot Examination: Skin presentation:   Intact Ulcers & Callousing:   None and no history Toe / Foot Deformities:  Charcot, right 5th ray resection Weight Bearing Presentation:  Cavus Sensation:    Intact  ORTHOTIC RECOMMENDATION Recommended Device: 1x pair of custom functional foot orthotics  GOALS OF ORTHOSES - Reduce Pain - Prevent Foot Deformity - Prevent Progression of Further Foot Deformity - Relieve Pressure - Improve the Overall Biomechanical Function of the Foot and Lower Extremity.  ACTIONS PERFORMED Patient was casted for Foot Orthoses via crush box. Procedure was explained and patient tolerated procedure well. All questions were answered and concerns addressed.  PLAN Potential out of pocket cost was communicated to patient. Casts are to be sent to [facility] for fabrication. Patient is to be called for fitting when devices are ready.

## 2021-03-07 NOTE — Telephone Encounter (Signed)
Pt left message yesterday stating he was seen yesterday and talked about ordering orthotics but pt would like his insurance checked prior to them being ordered. He is also possibly changing insurances and would like to know how that would effect it.

## 2021-03-07 NOTE — Telephone Encounter (Signed)
Spoke to pt and he is aware of orthotic coverage, covered @ 60% no deductible applies.Marland Kitchen He wants to proceed.

## 2021-03-15 ENCOUNTER — Encounter (INDEPENDENT_AMBULATORY_CARE_PROVIDER_SITE_OTHER): Payer: Self-pay | Admitting: Family Medicine

## 2021-03-15 ENCOUNTER — Ambulatory Visit (INDEPENDENT_AMBULATORY_CARE_PROVIDER_SITE_OTHER): Payer: BC Managed Care – PPO | Admitting: Family Medicine

## 2021-03-15 ENCOUNTER — Other Ambulatory Visit: Payer: Self-pay

## 2021-03-15 ENCOUNTER — Other Ambulatory Visit (HOSPITAL_COMMUNITY): Payer: Self-pay

## 2021-03-15 VITALS — BP 121/75 | HR 92 | Temp 97.5°F | Ht 71.0 in | Wt 265.0 lb

## 2021-03-15 DIAGNOSIS — Z6841 Body Mass Index (BMI) 40.0 and over, adult: Secondary | ICD-10-CM

## 2021-03-15 DIAGNOSIS — Z6837 Body mass index (BMI) 37.0-37.9, adult: Secondary | ICD-10-CM

## 2021-03-15 DIAGNOSIS — Z7985 Long-term (current) use of injectable non-insulin antidiabetic drugs: Secondary | ICD-10-CM

## 2021-03-15 DIAGNOSIS — E559 Vitamin D deficiency, unspecified: Secondary | ICD-10-CM

## 2021-03-15 DIAGNOSIS — Z9189 Other specified personal risk factors, not elsewhere classified: Secondary | ICD-10-CM

## 2021-03-15 DIAGNOSIS — E1169 Type 2 diabetes mellitus with other specified complication: Secondary | ICD-10-CM | POA: Diagnosis not present

## 2021-03-15 DIAGNOSIS — F5089 Other specified eating disorder: Secondary | ICD-10-CM | POA: Diagnosis not present

## 2021-03-15 DIAGNOSIS — E1122 Type 2 diabetes mellitus with diabetic chronic kidney disease: Secondary | ICD-10-CM | POA: Diagnosis not present

## 2021-03-15 DIAGNOSIS — N183 Chronic kidney disease, stage 3 unspecified: Secondary | ICD-10-CM | POA: Diagnosis not present

## 2021-03-15 DIAGNOSIS — E669 Obesity, unspecified: Secondary | ICD-10-CM

## 2021-03-15 MED ORDER — TIRZEPATIDE 12.5 MG/0.5ML ~~LOC~~ SOAJ
12.5000 mg | SUBCUTANEOUS | 0 refills | Status: DC
  Filled 2021-03-15: qty 2, 28d supply, fill #0

## 2021-03-15 MED ORDER — BUPROPION HCL ER (SR) 150 MG PO TB12: 150.0000 mg | ORAL_TABLET | Freq: Every day | ORAL | 0 refills | Status: DC

## 2021-03-15 MED ORDER — TIRZEPATIDE 12.5 MG/0.5ML ~~LOC~~ SOAJ: 12.5000 mg | SUBCUTANEOUS | 0 refills | Status: DC

## 2021-03-15 MED ORDER — GLIMEPIRIDE 2 MG PO TABS: ORAL_TABLET | ORAL | 0 refills | Status: DC

## 2021-03-15 MED ORDER — VITAMIN D (ERGOCALCIFEROL) 1.25 MG (50000 UNIT) PO CAPS: ORAL_CAPSULE | ORAL | 0 refills | Status: DC

## 2021-03-16 NOTE — Progress Notes (Signed)
Chief Complaint:   OBESITY [name] is here to discuss his progress with his obesity treatment plan along with follow-up of his obesity related diagnoses. [name] is on the Category 1 Plan and states he is following his eating plan approximately 50% of the time. [name] states he is using stationary bike and free weights 20 minutes 2 times per week.  Today's visit was #: 71 Starting weight: 310 lbs Starting date: 09/01/2019 Today's weight: 265 lbs Today's date: 03/15/2021 Total lbs lost to date: 45 Total lbs lost since last in-office visit: 2  Interim History:  [name] is here for a follow up office visit. We reviewed his meal plan and questions were answered.  Patient's food recall appears to be accurate and consistent with what is on plan when he is following it. When eating on plan, his hunger and cravings are well controlled.  Pt still goes to the gym 2 days a week. He has no concerns today.  Here to review recent labs recently drawn    Subjective:   1. Type 2 diabetes mellitus with other specified complication, without long-term current use of insulin ([name]) Discussed labs with patient today.  A1c worsening and increased to 6.9.   Pt's highest FBS 160, lowest 108, and average in the 130's, which are much better than the 360's prior.  - He is tolerating increase in glimepiride from last OV well.  - We also increased Mounjaro to 12.5 mg at last OV and it is helping more with appetite and cravings.  Tolerating well.     2. Chronic renal impairment associated with type 2 diabetes mellitus ([name]) Discussed labs with patient today.  Worsening serum crt- now up to 1.67  Pt is asymptomatic and without concerns. He is on an ARB daily.   3. Other disorder of eating with emotional eating Mood stable. Pt has no issues and denies emotional eating lately.    4. Vitamin D deficiency Discussed labs with patient today. Above goal. He is currently taking prescription vitamin  D 50,000 IU each week. He denies nausea, vomiting or muscle weakness.    5. At high risk for kidney disease/damage [name] is at risk for kidney disease/damage due to his diabetes mellitus.     Assessment/Plan:   Medications Discontinued During This Encounter  Medication Reason   tirzepatide (MOUNJARO) 12.5 MG/0.5ML Pen Reorder   buPROPion (WELLBUTRIN SR) 150 MG 12 hr tablet Reorder   glimepiride (AMARYL) 2 MG tablet Reorder   Vitamin D, Ergocalciferol, (DRISDOL) 1.25 MG (50000 UNIT) CAPS capsule Reorder   tirzepatide (MOUNJARO) 12.5 MG/0.5ML Pen      Meds ordered this encounter  Medications   buPROPion (WELLBUTRIN SR) 150 MG 12 hr tablet    Sig: Take 1 tablet (150 mg total) by mouth daily.    Dispense:  30 tablet    Refill:  0   glimepiride (AMARYL) 2 MG tablet    Sig: TAKE 1 TABLET BY MOUTH IN THE MORNING AND AT BEDTIME    Dispense:  60 tablet    Refill:  0   DISCONTD: tirzepatide (MOUNJARO) 12.5 MG/0.5ML Pen    Sig: Inject 12.5 mg into the skin once a week.    Dispense:  2 mL    Refill:  0   Vitamin D, Ergocalciferol, (DRISDOL) 1.25 MG (50000 UNIT) CAPS capsule    Sig: 1 po q 2 wks    Dispense:  6 capsule    Refill:  0  90 d supply   tirzepatide (MOUNJARO) 12.5 MG/0.5ML Pen    Sig: Inject 12.5 mg into the skin once a week.    Dispense:  2 mL    Refill:  0     1. Type 2 diabetes mellitus with other specified complication, without long-term current use of insulin ([name]) Worsening A1c Continue at the gym and increase as tolerated. Disease counseling done and diabetes mellitus effects on the kidneys, nerves, etc reviewed again with pt. Continue Mounjaro and Amaryl. Refill- glimepiride (AMARYL) 2 MG tablet; TAKE 1 TABLET BY MOUTH IN THE MORNING AND AT BEDTIME  Dispense: 60 tablet; Refill: 0 Refill- tirzepatide (MOUNJARO) 12.5 MG/0.5ML Pen; Inject 12.5 mg into the skin once a week.  Dispense: 2 mL; Refill: 0   2. Chronic renal impairment associated with type 2  diabetes mellitus ([name]) Worsening serum creatinine to 1.67, BUN 27, and lower GFR now. Counseling done. Pt advised to work harder to control blood sugar and blood pressures   3. Other disorder of eating with emotional eating Stable.  Behavior modification techniques were discussed today to help [name] deal with his emotional/non-hunger eating behaviors. Orders and follow up as documented in patient record. Continue Wellbutrin at same dose.  Refill- buPROPion (WELLBUTRIN SR) 150 MG 12 hr tablet; Take 1 tablet (150 mg total) by mouth daily.  Dispense: 30 tablet; Refill: 0   4. Vitamin D deficiency Vit D level slightly above goal as pt continues to lose weight.  - Pt Asx w/o concerns Low Vitamin D level contributes to fatigue and are associated with obesity, breast, and colon cancer. He agrees to decrease prescription Vitamin D 50,000 IU to every 2 weeks and will follow-up for routine testing of Vitamin D - Recheck in 3 months.  Decrease & Refill- Vitamin D, Ergocalciferol, (DRISDOL) 1.25 MG (50000 UNIT) CAPS capsule; 1 po q 2 wks  Dispense: 6 capsule; Refill: 0   5. At high risk for kidney disease/damage - [name] was given chronic kidney disease prevention education and counseling today of more than 22 minutes.  - Counseled patient on pathophysiology of disease and meaning/ implication of lab results.  - Reviewed how important adequate control of blood pressure and blood sugar is in preventing worsening renal function - Importance of following our healthy meal plan with limiting amounts of simple carbohydrates and focusing on decreasing salty foods discussed with patient - Effects of regular aerobic exercise on blood sugar and blood pressure regulation reviewed and we encouraged an eventual goal of 30 min * 5d /week or more as a minimum. This will also increase blood flow to the kidneys and improve function of vital organs - Use caution with potenitally nephrotoxic over the counter  medications and supplements Pt may to go online for further information: [email or web address]   6. Obesity, current BMI 37.0 [name] is currently in the action stage of change. As such, his goal is to continue with weight loss efforts. He has agreed to the Category 1 Plan.   Exercise goals:  Increase exercise to 30 minutes 5 days a week.  Behavioral modification strategies: increasing lean protein intake, decreasing simple carbohydrates, and planning for success.  [name] has agreed to follow-up with our clinic in 3-4 weeks. He was informed of the importance of frequent follow-up visits to maximize his success with intensive lifestyle modifications for his multiple health conditions.   Objective:   Blood pressure 121/75, pulse 92, temperature (!) 97.5 F (36.4 C), height 5\' 11"  (1.803 m), weight 265 lb (  120.2 kg), SpO2 99 %. Body mass index is 36.96 kg/m.  General: Cooperative, alert, well developed, in no acute distress. HEENT: Conjunctivae and lids unremarkable. Cardiovascular: Regular rhythm.  Lungs: Normal work of breathing. Neurologic: No focal deficits.   Lab Results  Component Value Date   CREATININE 1.67 (H) 02/22/2021   BUN 27 (H) 02/22/2021   NA 139 02/22/2021   K 4.8 02/22/2021   CL 99 02/22/2021   CO2 22 02/22/2021   Lab Results  Component Value Date   ALT 24 10/31/2020   AST 20 10/31/2020   ALKPHOS 84 10/31/2020   BILITOT 0.9 10/31/2020   Lab Results  Component Value Date   HGBA1C 6.9 (H) 02/22/2021   HGBA1C 6.4 (H) 10/31/2020   HGBA1C 6.0 (H) 07/04/2020   HGBA1C 5.6 03/14/2020   HGBA1C 5.4 11/17/2019   Lab Results  Component Value Date   INSULIN 9.5 10/31/2020   INSULIN 28.9 (H) 09/01/2019   Lab Results  Component Value Date   TSH 4.170 10/31/2020   Lab Results  Component Value Date   CHOL 151 10/31/2020   HDL 53 10/31/2020   LDLCALC 76 10/31/2020   TRIG 122 10/31/2020   CHOLHDL 2.9 02/01/2020   Lab  Results  Component Value Date   VD25OH 83.5 02/22/2021   VD25OH 84.9 10/31/2020   VD25OH 58.2 07/04/2020   Lab Results  Component Value Date   WBC 7.9 10/31/2020   HGB 15.2 10/31/2020   HCT 44.8 10/31/2020   MCV 91 10/31/2020   PLT 285 10/31/2020    Attestation Statements:   Reviewed by clinician on day of visit: allergies, medications, problem list, medical history, surgical history, family history, social history, and previous encounter notes.  Coral Ceo, CMA, am acting as transcriptionist for [employer], DO.  I have reviewed the above documentation for accuracy and completeness, and I agree with the above. Marjory Sneddon, D.O.  The [place] was signed into law in 2016 which includes the topic of electronic health records.  This provides immediate access to information in MyChart.  This includes consultation notes, operative notes, office notes, lab results and pathology reports.  If you have any questions about what you read please let us know at your next visit so we can discuss your concerns and take corrective action if need be.  We are right here with you.

## 2021-03-20 ENCOUNTER — Other Ambulatory Visit (HOSPITAL_COMMUNITY): Payer: Self-pay

## 2021-03-20 ENCOUNTER — Encounter (INDEPENDENT_AMBULATORY_CARE_PROVIDER_SITE_OTHER): Payer: Self-pay

## 2021-03-29 ENCOUNTER — Ambulatory Visit (INDEPENDENT_AMBULATORY_CARE_PROVIDER_SITE_OTHER): Payer: BC Managed Care – PPO | Admitting: Ophthalmology

## 2021-03-29 ENCOUNTER — Other Ambulatory Visit: Payer: Self-pay

## 2021-03-29 DIAGNOSIS — E113413 Type 2 diabetes mellitus with severe nonproliferative diabetic retinopathy with macular edema, bilateral: Secondary | ICD-10-CM

## 2021-03-29 DIAGNOSIS — G4733 Obstructive sleep apnea (adult) (pediatric): Secondary | ICD-10-CM | POA: Diagnosis not present

## 2021-03-29 DIAGNOSIS — E113411 Type 2 diabetes mellitus with severe nonproliferative diabetic retinopathy with macular edema, right eye: Secondary | ICD-10-CM

## 2021-03-29 DIAGNOSIS — E113412 Type 2 diabetes mellitus with severe nonproliferative diabetic retinopathy with macular edema, left eye: Secondary | ICD-10-CM

## 2021-03-29 NOTE — Assessment & Plan Note (Signed)
Patient continues with excellent compliance on CPAP nightly

## 2021-03-29 NOTE — Progress Notes (Signed)
03/29/2021     CHIEF COMPLAINT Patient presents for  Chief Complaint  Patient presents with   Diabetic Retinopathy without Macular Edema      HISTORY OF PRESENT ILLNESS: [name] is a 61 y.o. male who presents to the clinic today for:   HPI   No interval change in medical history since last visit no interval change in acuity Last edited by Edmon Crapeankin, Bee Marchiano A, MD on 03/29/2021  8:16 AM.      Referring physician: Marden NobleGates, Faysal, MD [address] [place] [id number]  HISTORICAL INFORMATION:   Selected notes from the MEDICAL RECORD NUMBER    Lab Results  Component Value Date   HGBA1C 6.9 (H) 02/22/2021     CURRENT MEDICATIONS: No current outpatient medications on file. (Ophthalmic Drugs)   No current facility-administered medications for this visit. (Ophthalmic Drugs)   Current Outpatient Medications (Other)  Medication Sig   albuterol (VENTOLIN HFA) 108 (90 Base) MCG/ACT inhaler Inhale 2 puffs into the lungs every 6 (six) hours as needed for wheezing or shortness of breath.   amLODipine (NORVASC) 5 MG tablet Take 5 mg by mouth daily.   aspirin EC 81 MG tablet Take 81 mg by mouth daily.   atorvastatin (LIPITOR) 10 MG tablet Take 1 tablet by mouth once daily   buPROPion (WELLBUTRIN SR) 150 MG 12 hr tablet Take 1 tablet (150 mg total) by mouth daily.   cetirizine (ZYRTEC) 10 MG tablet Take 10 mg by mouth daily.   Cholecalciferol (VITAMIN D) 50 MCG (2000 UT) tablet Take 4,000 Units by mouth daily.    cloNIDine (CATAPRES) 0.2 MG tablet Take 0.2 mg by mouth every evening.    glimepiride (AMARYL) 2 MG tablet TAKE 1 TABLET BY MOUTH IN THE MORNING AND AT BEDTIME   loratadine (CLARITIN) 10 MG tablet Take 10 mg by mouth daily.    losartan (COZAAR) 50 MG tablet Take 1 tablet (50 mg total) by mouth daily.   Magnesium 400 MG CAPS Take 400 mg by mouth daily.    metFORMIN (GLUCOPHAGE-XR) 500 MG 24 hr tablet Take 1,000 mg by mouth 2 (two) times daily.     pantoprazole (PROTONIX) 40 MG tablet TAKE 1 TABLET BY MOUTH TWICE DAILY (Patient taking differently: Take 40 mg by mouth 2 (two) times daily.)   Spacer/Aero-Holding Chambers (AEROCHAMBER MV) inhaler Use as instructed (Patient taking differently: as needed. Use as instructed for cough)   tirzepatide (MOUNJARO) 12.5 MG/0.5ML Pen Inject 12.5 mg into the skin once a week.   vitamin B-12 (CYANOCOBALAMIN) 500 MCG tablet Take 500 mcg by mouth daily.    Vitamin D, Ergocalciferol, (DRISDOL) 1.25 MG (50000 UNIT) CAPS capsule 1 po q 2 wks   No current facility-administered medications for this visit. (Other)      REVIEW OF SYSTEMS: ROS   Negative for: Constitutional, Gastrointestinal, Neurological, Skin, Genitourinary, Musculoskeletal, HENT, Endocrine, Cardiovascular, Eyes, Respiratory, Psychiatric, Allergic/Imm, Heme/Lymph Last edited by Edmon Crapeankin, Tiler Brandis A, MD on 03/29/2021  8:05 AM.       ALLERGIES No Known Allergies  PAST MEDICAL HISTORY Past Medical History:  Diagnosis Date   ADD (attention deficit disorder)    Anginal pain (HCC) 2012   Arthritis, shoulder region    B12 deficiency    Back pain    Coronary artery disease    Diabetes (HCC)    Diabetic retinopathy (HCC)    GERD (gastroesophageal reflux disease)    H/O heart artery stent 04/2010  Hyperlipidemia    Hypertension    Joint pain    Knee pain    Neuropathy    NSTEMI (non-ST elevated myocardial infarction) (HCC)    Obesity    Osteoarthritis    Persistent cough    stopped for a year   Sleep apnea    Swallowing difficulty    Swelling of both lower extremities    Past Surgical History:  Procedure Laterality Date   ACHILLES TENDON REPAIR Right    AMPUTATION TOE Right 03/14/2019   Procedure: AMPUTATION TOE, 5th toe;  Surgeon: [name], DPM;  Location: [facility] OR;  Service: Podiatry;  Laterality: Right;   heart stent  2012   x1   INCISION AND DRAINAGE Right 03/14/2019   Procedure: INCISION AND DRAINAGE;  Surgeon: [name], DPM;  Location: [facility] OR;  Service: Podiatry;  Laterality: Right;   SHOULDER ARTHROSCOPY Right    TEE WITHOUT CARDIOVERSION N/A 03/17/2019   Procedure: TRANSESOPHAGEAL ECHOCARDIOGRAM (TEE);  Surgeon: Chilton Si, MD;  Location: [facility] ENDOSCOPY;  Service: Cardiovascular;  Laterality: N/A;   TOE SURGERY Right 06/2018   for a hammer toe   TOTAL SHOULDER ARTHROPLASTY Right 11/19/2019   Procedure: TOTAL SHOULDER ARTHROPLASTY;  Surgeon: [name], MD;  Location: [facility] ORS;  Service: Orthopedics;  Laterality: Right;   UVULOPALATOPHARYNGOPLASTY      FAMILY HISTORY Family History  Problem Relation Age of Onset   Cancer Other    Diabetes Other    Heart disease Other    Breast cancer Mother    Cancer Mother    Heart disease Father    Diabetes Father    Stroke Father    Leukemia Brother    Asthma Maternal Uncle     SOCIAL HISTORY Social History   Tobacco Use   Smoking status: Never    Passive exposure: Yes   Smokeless tobacco: Never   Tobacco comments:    Both parents growing up.   Vaping Use   Vaping Use: Never used  Substance Use Topics   Alcohol use: Yes    Comment: 4-5 on weekends   Drug use: No         OPHTHALMIC EXAM:  Base Eye Exam     Visual Acuity (ETDRS)       Right Left   Dist cc 20/20 +2 20/15 -1    Correction: Glasses         Tonometry (Tonopen, 8:07 AM)       Right Left   Pressure 17 13         Pupils       Pupils APD   Right PERRL None   Left PERRL None         Visual Fields       Left Right    Full Full         Extraocular Movement       Right Left    Full Full         Neuro/Psych     Oriented x3: Yes   Mood/Affect: Normal         Dilation     Both eyes: 1.0% Mydriacyl, 2.5% Phenylephrine @ 8:16 AM           Slit Lamp and Fundus Exam     External Exam       Right Left   External Normal Normal         Slit Lamp Exam       Right Left  Lids/Lashes Normal Normal   Conjunctiva/Sclera  White and quiet White and quiet   Cornea Clear Clear   Anterior Chamber Deep and quiet Deep and quiet   Iris Round and reactive Round and reactive   Lens 1+ Nuclear sclerosis 1+ Nuclear sclerosis   Anterior Vitreous Normal Normal         Fundus Exam       Right Left   Posterior Vitreous Normal Normal   Disc Normal Normal   C/D Ratio 0.35 0.35   Macula Focal laser scars, Microaneurysms minor retinal thickening inferior improved Focal laser scars, Microaneurysms, minor thickening inferior to FAZ, not center involved   Vessels NPDR-Severe, with no cotton wool NPDR-Severe, with no cotton wool   Periphery Normal Normal            IMAGING AND PROCEDURES  Imaging and Procedures for 03/29/21  OCT, Retina - OU - Both Eyes       Right Eye Quality was good. Scan locations included subfoveal. Central Foveal Thickness: 333. Progression has been stable. Findings include abnormal foveal contour.   Left Eye Quality was good. Scan locations included subfoveal. Central Foveal Thickness: 314. Progression has improved. Findings include abnormal foveal contour.   Notes Diabetic CSME with focal leakages temporal OS and inferior to the fovea OS, and diabetic CSME temporally continue  CSME OD, temporally, now center involved will need to commence with antivegF OU     Color Fundus Photography Optos - OU - Both Eyes       Right Eye Progression has improved. Disc findings include normal observations. Macula : microaneurysms, edema.   Left Eye Progression has worsened. Disc findings include normal observations. Macula : microaneurysms, edema.   Notes No active cotton-wool spots seen posterior pole on each eye thus Not likely to have episodic hypertensive retinopathy today thus prior findings were secondary to episodic hypertension of untreated sleep apnea which have now resolved in each eye.             ASSESSMENT/PLAN:  Severe nonproliferative diabetic retinopathy of right eye,  with macular edema, associated with type 2 diabetes mellitus (HCC) OU with active CSME, OD inferior and center involved CSME now.  We will schedule back in 1 week for antivegF therapy  Severe nonproliferative diabetic retinopathy of left eye, with macular edema, associated with type 2 diabetes mellitus (HCC) OS persistent CSME noted on OCT and clinical examination.  Will need antivegF as well To control this area and potentially long-term focal laser repeated in order to prevent recurrences  Obstructive sleep apnea Patient continues with excellent compliance on CPAP nightly     ICD-10-CM   1. Severe nonproliferative diabetic retinopathy of right eye, with macular edema, associated with type 2 diabetes mellitus (HCC)  E11.3411 OCT, Retina - OU - Both Eyes    Color Fundus Photography Optos - OU - Both Eyes    2. Severe nonproliferative diabetic retinopathy of left eye, with macular edema, associated with type 2 diabetes mellitus (HCC)  E11.3412 OCT, Retina - OU - Both Eyes    Color Fundus Photography Optos - OU - Both Eyes    3. Obstructive sleep apnea  G47.33       1.  2.  3.  Ophthalmic Meds Ordered this visit:  No orders of the defined types were placed in this encounter.      Return in about 1 week (around 04/05/2021) for dilate, OD, AVASTIN OCT.  There are no Patient Instructions on file for this  visit.   Explained the diagnoses, plan, and follow up with the patient and they expressed understanding.  Patient expressed understanding of the importance of proper follow up care.   Alford Highland Leland Staszewski M.D. Diseases & Surgery of the Retina and Vitreous [facility] 03/29/21     Abbreviations: M myopia (nearsighted); A astigmatism; H hyperopia (farsighted); P presbyopia; Mrx spectacle prescription;  CTL contact lenses; OD right eye; OS left eye; OU both eyes  XT exotropia; ET esotropia; PEK punctate epithelial keratitis; PEE punctate epithelial erosions; DES dry  eye syndrome; MGD meibomian gland dysfunction; ATs artificial tears; PFAT's preservative free artificial tears; NSC nuclear sclerotic cataract; PSC posterior subcapsular cataract; ERM epi-retinal membrane; PVD posterior vitreous detachment; RD retinal detachment; DM diabetes mellitus; DR diabetic retinopathy; NPDR non-proliferative diabetic retinopathy; PDR proliferative diabetic retinopathy; CSME clinically significant macular edema; DME diabetic macular edema; dbh dot blot hemorrhages; CWS cotton wool spot; POAG primary open angle glaucoma; C/D cup-to-disc ratio; HVF humphrey visual field; GVF goldmann visual field; OCT optical coherence tomography; IOP intraocular pressure; BRVO Branch retinal vein occlusion; CRVO central retinal vein occlusion; CRAO central retinal artery occlusion; BRAO branch retinal artery occlusion; RT retinal tear; SB scleral buckle; PPV pars plana vitrectomy; VH Vitreous hemorrhage; PRP panretinal laser photocoagulation; IVK intravitreal kenalog; VMT vitreomacular traction; MH Macular hole;  NVD neovascularization of the disc; NVE neovascularization elsewhere; AREDS age related eye disease study; ARMD age related macular degeneration; POAG primary open angle glaucoma; EBMD epithelial/anterior basement membrane dystrophy; ACIOL anterior chamber intraocular lens; IOL intraocular lens; PCIOL posterior chamber intraocular lens; Phaco/IOL phacoemulsification with intraocular lens placement; PRK photorefractive keratectomy; LASIK laser assisted in situ keratomileusis; HTN hypertension; DM diabetes mellitus; COPD chronic obstructive pulmonary disease

## 2021-03-29 NOTE — Assessment & Plan Note (Signed)
OS persistent CSME noted on OCT and clinical examination.  Will need antivegF as well To control this area and potentially long-term focal laser repeated in order to prevent recurrences

## 2021-03-29 NOTE — Assessment & Plan Note (Signed)
OU with active CSME, OD inferior and center involved CSME now.  We will schedule back in 1 week for antivegF therapy

## 2021-04-05 ENCOUNTER — Other Ambulatory Visit: Payer: Self-pay

## 2021-04-05 ENCOUNTER — Ambulatory Visit (INDEPENDENT_AMBULATORY_CARE_PROVIDER_SITE_OTHER): Payer: BC Managed Care – PPO | Admitting: Ophthalmology

## 2021-04-05 ENCOUNTER — Encounter (INDEPENDENT_AMBULATORY_CARE_PROVIDER_SITE_OTHER): Payer: Self-pay | Admitting: Ophthalmology

## 2021-04-05 DIAGNOSIS — E113412 Type 2 diabetes mellitus with severe nonproliferative diabetic retinopathy with macular edema, left eye: Secondary | ICD-10-CM

## 2021-04-05 DIAGNOSIS — E113411 Type 2 diabetes mellitus with severe nonproliferative diabetic retinopathy with macular edema, right eye: Secondary | ICD-10-CM | POA: Diagnosis not present

## 2021-04-05 MED ORDER — BEVACIZUMAB 2.5 MG/0.1ML IZ SOSY
2.5000 mg | PREFILLED_SYRINGE | INTRAVITREAL | Status: AC | PRN
  Administered 2021-04-05: 2.5 mg via INTRAVITREAL

## 2021-04-05 NOTE — Assessment & Plan Note (Signed)
We will schedule treatment OS very soon

## 2021-04-05 NOTE — Progress Notes (Signed)
04/05/2021     CHIEF COMPLAINT Patient presents for  Chief Complaint  Patient presents with   Diabetic Retinopathy with Macular Edema      HISTORY OF PRESENT ILLNESS: [name] is a 61 y.o. male who presents to the clinic today for:   HPI   1 week dilate OD, Avastin OCT. Patient states vision is stable and unchanged since last visit. Denies any new floaters or FOL. Pt did not bring his glasses with him today. Last edited by Nelva Nay on 04/05/2021  8:21 AM.      Referring physician: Marden Noble, MD [address] [place],  [place] [address]  HISTORICAL INFORMATION:   Selected notes from the MEDICAL RECORD NUMBER    Lab Results  Component Value Date   HGBA1C 6.9 (H) 02/22/2021     CURRENT MEDICATIONS: No current outpatient medications on file. (Ophthalmic Drugs)   No current facility-administered medications for this visit. (Ophthalmic Drugs)   Current Outpatient Medications (Other)  Medication Sig   albuterol (VENTOLIN HFA) 108 (90 Base) MCG/ACT inhaler Inhale 2 puffs into the lungs every 6 (six) hours as needed for wheezing or shortness of breath.   amLODipine (NORVASC) 5 MG tablet Take 5 mg by mouth daily.   aspirin EC 81 MG tablet Take 81 mg by mouth daily.   atorvastatin (LIPITOR) 10 MG tablet Take 1 tablet by mouth once daily   buPROPion (WELLBUTRIN SR) 150 MG 12 hr tablet Take 1 tablet (150 mg total) by mouth daily.   cetirizine (ZYRTEC) 10 MG tablet Take 10 mg by mouth daily.   Cholecalciferol (VITAMIN D) 50 MCG (2000 UT) tablet Take 4,000 Units by mouth daily.    cloNIDine (CATAPRES) 0.2 MG tablet Take 0.2 mg by mouth every evening.    glimepiride (AMARYL) 2 MG tablet TAKE 1 TABLET BY MOUTH IN THE MORNING AND AT BEDTIME   loratadine (CLARITIN) 10 MG tablet Take 10 mg by mouth daily.    losartan (COZAAR) 50 MG tablet Take 1 tablet (50 mg total) by mouth daily.   Magnesium 400 MG CAPS Take 400 mg by mouth daily.    metFORMIN  (GLUCOPHAGE-XR) 500 MG 24 hr tablet Take 1,000 mg by mouth 2 (two) times daily.    pantoprazole (PROTONIX) 40 MG tablet TAKE 1 TABLET BY MOUTH TWICE DAILY (Patient taking differently: Take 40 mg by mouth 2 (two) times daily.)   Spacer/Aero-Holding Chambers (AEROCHAMBER MV) inhaler Use as instructed (Patient taking differently: as needed. Use as instructed for cough)   tirzepatide (MOUNJARO) 12.5 MG/0.5ML Pen Inject 12.5 mg into the skin once a week.   vitamin B-12 (CYANOCOBALAMIN) 500 MCG tablet Take 500 mcg by mouth daily.    Vitamin D, Ergocalciferol, (DRISDOL) 1.25 MG (50000 UNIT) CAPS capsule 1 po q 2 wks   No current facility-administered medications for this visit. (Other)      REVIEW OF SYSTEMS: ROS   Negative for: Constitutional, Gastrointestinal, Neurological, Skin, Genitourinary, Musculoskeletal, HENT, Endocrine, Cardiovascular, Eyes, Respiratory, Psychiatric, Allergic/Imm, Heme/Lymph Last edited by Edmon Crape, MD on 04/05/2021  9:10 AM.       ALLERGIES No Known Allergies  PAST MEDICAL HISTORY Past Medical History:  Diagnosis Date   ADD (attention deficit disorder)    Anginal pain (HCC) 2012   Arthritis, shoulder region    B12 deficiency    Back pain    Coronary artery disease    Diabetes (HCC)    Diabetic retinopathy (HCC)  GERD (gastroesophageal reflux disease)    H/O heart artery stent 04/2010   Hyperlipidemia    Hypertension    Joint pain    Knee pain    Neuropathy    NSTEMI (non-ST elevated myocardial infarction) (HCC)    Obesity    Osteoarthritis    Persistent cough    stopped for a year   Sleep apnea    Swallowing difficulty    Swelling of both lower extremities    Past Surgical History:  Procedure Laterality Date   ACHILLES TENDON REPAIR Right    AMPUTATION TOE Right 03/14/2019   Procedure: AMPUTATION TOE, 5th toe;  Surgeon: [name], DPM;  Location: [facility] OR;  Service: Podiatry;  Laterality: Right;   heart stent  2012   x1   INCISION  AND DRAINAGE Right 03/14/2019   Procedure: INCISION AND DRAINAGE;  Surgeon: [name], DPM;  Location: [facility] OR;  Service: Podiatry;  Laterality: Right;   SHOULDER ARTHROSCOPY Right    TEE WITHOUT CARDIOVERSION N/A 03/17/2019   Procedure: TRANSESOPHAGEAL ECHOCARDIOGRAM (TEE);  Surgeon: Chilton Si, MD;  Location: [facility] ENDOSCOPY;  Service: Cardiovascular;  Laterality: N/A;   TOE SURGERY Right 06/2018   for a hammer toe   TOTAL SHOULDER ARTHROPLASTY Right 11/19/2019   Procedure: TOTAL SHOULDER ARTHROPLASTY;  Surgeon: [name], MD;  Location: [facility] ORS;  Service: Orthopedics;  Laterality: Right;   UVULOPALATOPHARYNGOPLASTY      FAMILY HISTORY Family History  Problem Relation Age of Onset   Cancer Other    Diabetes Other    Heart disease Other    Breast cancer Mother    Cancer Mother    Heart disease Father    Diabetes Father    Stroke Father    Leukemia Brother    Asthma Maternal Uncle     SOCIAL HISTORY Social History   Tobacco Use   Smoking status: Never    Passive exposure: Yes   Smokeless tobacco: Never   Tobacco comments:    Both parents growing up.   Vaping Use   Vaping Use: Never used  Substance Use Topics   Alcohol use: Yes    Comment: 4-5 on weekends   Drug use: No         OPHTHALMIC EXAM:  Base Eye Exam     Visual Acuity (ETDRS)       Right Left   Dist [place] 20/50 -2 20/50 -2   Dist ph [place] 20/25 -1+2 20/20         Tonometry (Tonopen, 8:25 AM)       Right Left   Pressure 17 16         Pupils       Pupils Dark Light Shape APD   Right PERRL 3 2 Round None   Left PERRL 3 2 Round None         Visual Fields (Counting fingers)       Left Right    Full Full         Extraocular Movement       Right Left    Full Full         Neuro/Psych     Oriented x3: Yes   Mood/Affect: Normal         Dilation     Right eye: 1.0% Mydriacyl, 2.5% Phenylephrine @ 8:23 AM           Slit Lamp and Fundus Exam     External  Exam  Right Left   External Normal Normal         Slit Lamp Exam       Right Left   Lids/Lashes Normal Normal   Conjunctiva/Sclera White and quiet White and quiet   Cornea Clear Clear   Anterior Chamber Deep and quiet Deep and quiet   Iris Round and reactive Round and reactive   Lens 1+ Nuclear sclerosis 1+ Nuclear sclerosis   Anterior Vitreous Normal Normal         Fundus Exam       Right Left   Posterior Vitreous Normal Normal   Disc Normal Normal   C/D Ratio 0.35 0.35   Macula Focal laser scars, Microaneurysms minor retinal thickening inferior improved Focal laser scars, Microaneurysms, minor thickening inferior to FAZ, not center involved   Vessels NPDR-Severe, with no cotton wool NPDR-Severe, with no cotton wool   Periphery Normal Normal            IMAGING AND PROCEDURES  Imaging and Procedures for 04/05/21  OCT, Retina - OU - Both Eyes       Right Eye Quality was good. Scan locations included subfoveal. Central Foveal Thickness: 330. Progression has been stable. Findings include abnormal foveal contour.   Left Eye Quality was good. Scan locations included subfoveal. Central Foveal Thickness: 317. Progression has improved. Findings include abnormal foveal contour.   Notes Diabetic CSME with focal leakages temporal OS and inferior to the fovea OS, and diabetic CSME temporally continue  CSME OD, temporally, now center involved will need to commence with antivegF OU     Intravitreal Injection, Pharmacologic Agent - OD - Right Eye       Time Out 04/05/2021. 9:11 AM. Confirmed correct patient, procedure, site, and patient consented.   Anesthesia Topical anesthesia was used. Anesthetic medications included Lidocaine 4%.   Procedure Preparation included 5% betadine to ocular surface, 10% betadine to eyelids. A 30 gauge needle was used.   Injection: 2.5 mg bevacizumab 2.5 MG/0.1ML   Route: Intravitreal, Site: Right Eye   NDC: [id number], Lot:  [id number]   Post-op Post injection exam found visual acuity of at least counting fingers. The patient tolerated the procedure well. There were no complications. The patient received written and verbal post procedure care education. Post injection medications included ocuflox.              ASSESSMENT/PLAN:  Severe nonproliferative diabetic retinopathy of right eye, with macular edema, associated with type 2 diabetes mellitus (HCC) CSME OD, will need to commence intravitreal antivegF today and likely need focal laser treatment to decrease burden  Severe nonproliferative diabetic retinopathy of left eye, with macular edema, associated with type 2 diabetes mellitus (HCC) We will schedule treatment OS very soon     ICD-10-CM   1. Severe nonproliferative diabetic retinopathy of right eye, with macular edema, associated with type 2 diabetes mellitus (HCC)  E11.3411 OCT, Retina - OU - Both Eyes    Intravitreal Injection, Pharmacologic Agent - OD - Right Eye    bevacizumab (AVASTIN) SOSY 2.5 mg    2. Severe nonproliferative diabetic retinopathy of left eye, with macular edema, associated with type 2 diabetes mellitus (HCC)  [id number]       1.  Completion of intravitreal Avastin performed today, will reassess efficacy of treatment OD next week or 2 when the patient returns for similar treatment left eye.  2.  Tentative plan will be to resolve the CSME OD with an eye vegF and then potentially deliver  focal laser treatment for long-term control  3.  Ophthalmic Meds Ordered this visit:  Meds ordered this encounter  Medications   bevacizumab (AVASTIN) SOSY 2.5 mg       Return in about 1 week (around 04/12/2021) for dilate, OS, AVASTIN OCT.  There are no Patient Instructions on file for this visit.   Explained the diagnoses, plan, and follow up with the patient and they expressed understanding.  Patient expressed understanding of the importance of proper follow up care.   Alford Highland Cherri Yera  M.D. Diseases & Surgery of the Retina and Vitreous [facility] 04/05/21     Abbreviations: M myopia (nearsighted); A astigmatism; H hyperopia (farsighted); P presbyopia; Mrx spectacle prescription;  CTL contact lenses; OD right eye; OS left eye; OU both eyes  XT exotropia; ET esotropia; PEK punctate epithelial keratitis; PEE punctate epithelial erosions; DES dry eye syndrome; MGD meibomian gland dysfunction; ATs artificial tears; PFAT's preservative free artificial tears; NSC nuclear sclerotic cataract; PSC posterior subcapsular cataract; ERM epi-retinal membrane; PVD posterior vitreous detachment; RD retinal detachment; DM diabetes mellitus; DR diabetic retinopathy; NPDR non-proliferative diabetic retinopathy; PDR proliferative diabetic retinopathy; CSME clinically significant macular edema; DME diabetic macular edema; dbh dot blot hemorrhages; CWS cotton wool spot; POAG primary open angle glaucoma; C/D cup-to-disc ratio; HVF humphrey visual field; GVF goldmann visual field; OCT optical coherence tomography; IOP intraocular pressure; BRVO Branch retinal vein occlusion; CRVO central retinal vein occlusion; CRAO central retinal artery occlusion; BRAO branch retinal artery occlusion; RT retinal tear; SB scleral buckle; PPV pars plana vitrectomy; VH Vitreous hemorrhage; PRP panretinal laser photocoagulation; IVK intravitreal kenalog; VMT vitreomacular traction; MH Macular hole;  NVD neovascularization of the disc; NVE neovascularization elsewhere; AREDS age related eye disease study; ARMD age related macular degeneration; POAG primary open angle glaucoma; EBMD epithelial/anterior basement membrane dystrophy; ACIOL anterior chamber intraocular lens; IOL intraocular lens; PCIOL posterior chamber intraocular lens; Phaco/IOL phacoemulsification with intraocular lens placement; PRK photorefractive keratectomy; LASIK laser assisted in situ keratomileusis; HTN hypertension; DM diabetes mellitus; COPD  chronic obstructive pulmonary disease

## 2021-04-05 NOTE — Assessment & Plan Note (Signed)
CSME OD, will need to commence intravitreal antivegF today and likely need focal laser treatment to decrease burden

## 2021-04-12 ENCOUNTER — Encounter (INDEPENDENT_AMBULATORY_CARE_PROVIDER_SITE_OTHER): Payer: BC Managed Care – PPO | Admitting: Ophthalmology

## 2021-04-12 ENCOUNTER — Other Ambulatory Visit: Payer: Self-pay

## 2021-04-12 ENCOUNTER — Ambulatory Visit (INDEPENDENT_AMBULATORY_CARE_PROVIDER_SITE_OTHER): Payer: BC Managed Care – PPO | Admitting: Family Medicine

## 2021-04-12 ENCOUNTER — Encounter (INDEPENDENT_AMBULATORY_CARE_PROVIDER_SITE_OTHER): Payer: Self-pay | Admitting: Family Medicine

## 2021-04-12 VITALS — BP 123/74 | HR 98 | Temp 97.5°F | Ht 71.0 in | Wt 267.0 lb

## 2021-04-12 DIAGNOSIS — E559 Vitamin D deficiency, unspecified: Secondary | ICD-10-CM | POA: Diagnosis not present

## 2021-04-12 DIAGNOSIS — Z0001 Encounter for general adult medical examination with abnormal findings: Secondary | ICD-10-CM | POA: Diagnosis not present

## 2021-04-12 DIAGNOSIS — Z9189 Other specified personal risk factors, not elsewhere classified: Secondary | ICD-10-CM

## 2021-04-12 DIAGNOSIS — E1159 Type 2 diabetes mellitus with other circulatory complications: Secondary | ICD-10-CM

## 2021-04-12 DIAGNOSIS — F331 Major depressive disorder, recurrent, moderate: Secondary | ICD-10-CM | POA: Diagnosis not present

## 2021-04-12 DIAGNOSIS — I10 Essential (primary) hypertension: Secondary | ICD-10-CM | POA: Diagnosis not present

## 2021-04-12 DIAGNOSIS — E1169 Type 2 diabetes mellitus with other specified complication: Secondary | ICD-10-CM | POA: Diagnosis not present

## 2021-04-12 DIAGNOSIS — F5089 Other specified eating disorder: Secondary | ICD-10-CM

## 2021-04-12 DIAGNOSIS — E1140 Type 2 diabetes mellitus with diabetic neuropathy, unspecified: Secondary | ICD-10-CM | POA: Diagnosis not present

## 2021-04-12 DIAGNOSIS — Z6841 Body Mass Index (BMI) 40.0 and over, adult: Secondary | ICD-10-CM

## 2021-04-12 DIAGNOSIS — I152 Hypertension secondary to endocrine disorders: Secondary | ICD-10-CM | POA: Diagnosis not present

## 2021-04-12 DIAGNOSIS — Z7984 Long term (current) use of oral hypoglycemic drugs: Secondary | ICD-10-CM

## 2021-04-12 DIAGNOSIS — I2510 Atherosclerotic heart disease of native coronary artery without angina pectoris: Secondary | ICD-10-CM | POA: Diagnosis not present

## 2021-04-12 DIAGNOSIS — Z79899 Other long term (current) drug therapy: Secondary | ICD-10-CM | POA: Diagnosis not present

## 2021-04-12 DIAGNOSIS — G4733 Obstructive sleep apnea (adult) (pediatric): Secondary | ICD-10-CM | POA: Diagnosis not present

## 2021-04-12 DIAGNOSIS — Z23 Encounter for immunization: Secondary | ICD-10-CM | POA: Diagnosis not present

## 2021-04-12 DIAGNOSIS — E113413 Type 2 diabetes mellitus with severe nonproliferative diabetic retinopathy with macular edema, bilateral: Secondary | ICD-10-CM | POA: Diagnosis not present

## 2021-04-12 DIAGNOSIS — E1165 Type 2 diabetes mellitus with hyperglycemia: Secondary | ICD-10-CM | POA: Diagnosis not present

## 2021-04-12 DIAGNOSIS — M109 Gout, unspecified: Secondary | ICD-10-CM | POA: Diagnosis not present

## 2021-04-12 DIAGNOSIS — Z125 Encounter for screening for malignant neoplasm of prostate: Secondary | ICD-10-CM | POA: Diagnosis not present

## 2021-04-12 DIAGNOSIS — I25119 Atherosclerotic heart disease of native coronary artery with unspecified angina pectoris: Secondary | ICD-10-CM | POA: Diagnosis not present

## 2021-04-12 MED ORDER — GLIMEPIRIDE 2 MG PO TABS: ORAL_TABLET | ORAL | 0 refills | Status: DC

## 2021-04-12 MED ORDER — LOSARTAN POTASSIUM 50 MG PO TABS: 50.0000 mg | ORAL_TABLET | Freq: Every day | ORAL | 0 refills | Status: DC

## 2021-04-12 MED ORDER — BUPROPION HCL ER (SR) 150 MG PO TB12: 150.0000 mg | ORAL_TABLET | Freq: Two times a day (BID) | ORAL | 0 refills | Status: DC

## 2021-04-12 MED ORDER — TIRZEPATIDE 12.5 MG/0.5ML ~~LOC~~ SOAJ: 12.5000 mg | SUBCUTANEOUS | 0 refills | Status: DC

## 2021-04-13 NOTE — Progress Notes (Signed)
? ? ? ?Chief Complaint:  ? ?OBESITY ?[name] is here to discuss his progress with his obesity treatment plan along with follow-up of his obesity related diagnoses. [name] is on the Category 1 Plan and states he is following his eating plan approximately 60% of the time. [name] states he is doing 0 minutes 0 times per week. ? ?Today's visit was #: 28 ?Starting weight: 310 lbs ?Starting date: 09/01/2019 ?Today's weight: 267 lbs ?Today's date: 04/12/2021 ?Total lbs lost to date: 61 ?Total lbs lost since last in-office visit: 0 ? ?Interim History: [name] had a lot of social gatherings and events lately. He does good all week except for Friday and Saturday, then "blows it" when he goes out for dinner etc. ? ?Subjective:  ? ?1. Type 2 diabetes mellitus with other specified complication, without long-term current use of insulin ([place]) ?[name]'s fasting blood sugar is better, between 107-148 and average 130. ? ?2. Hypertension associated with type 2 diabetes mellitus ([place]) ?[name]'s blood pressure is at goal.  ? ?BP Readings from Last 3 Encounters:  ?04/12/21 123/74  ?03/15/21 121/75  ?02/22/21 122/78  ? ?3. Other disorder of eating with emotional eating ?[name] increased his dose of bupropion from 100 to 150 on 02/22/2021. No change and he started on 12/21/2020. ? ?4. At risk for activity intolerance ?[name] is at risk for exercise intolerance due to inactivity. ? ? ?Assessment/Plan:  ?No orders of the defined types were placed in this encounter. ? ? ?Medications Discontinued During This Encounter  ?Medication Reason  ? losartan (COZAAR) 50 MG tablet Reorder  ? buPROPion (WELLBUTRIN SR) 150 MG 12 hr tablet Reorder  ? glimepiride (AMARYL) 2 MG tablet Reorder  ? tirzepatide (MOUNJARO) 12.5 MG/0.5ML Pen Reorder  ?  ? ?Meds ordered this encounter  ?Medications  ? buPROPion (WELLBUTRIN SR) 150 MG 12 hr tablet  ?  Sig: Take 1 tablet (150 mg total) by mouth 2 (two) times daily.  ?  Dispense:  60 tablet  ?  Refill:  0  ? glimepiride  (AMARYL) 2 MG tablet  ?  Sig: TAKE 1 TABLET BY MOUTH IN THE MORNING AND AT BEDTIME  ?  Dispense:  60 tablet  ?  Refill:  0  ? losartan (COZAAR) 50 MG tablet  ?  Sig: Take 1 tablet (50 mg total) by mouth daily.  ?  Dispense:  30 tablet  ?  Refill:  0  ? tirzepatide (MOUNJARO) 12.5 MG/0.5ML Pen  ?  Sig: Inject 12.5 mg into the skin once a week.  ?  Dispense:  2 mL  ?  Refill:  0  ?  ? ?1. Type 2 diabetes mellitus with other specified complication, without long-term current use of insulin ([place]) ?We will refill Amaryl and Mounjaro for 1 month at the same dose. [name] declines the need for increase in dose today. ? ?- glimepiride (AMARYL) 2 MG tablet; TAKE 1 TABLET BY MOUTH IN THE MORNING AND AT BEDTIME  Dispense: 60 tablet; Refill: 0 ?- tirzepatide (MOUNJARO) 12.5 MG/0.5ML Pen; Inject 12.5 mg into the skin once a week.  Dispense: 2 mL; Refill: 0 ? ?2. Hypertension associated with type 2 diabetes mellitus ([place]) ?BP is at goal today. We will refill losartan for 1 month. ? ?[name] on pathophysiology of disease and discussed treatment plan, which always includes dietary and lifestyle modification as first line.  ?Lifestyle changes such as following our low salt, heart healthy meal plan and engaging in a regular exercise program discussed  ?-  Avoid buying foods that are: processed, frozen, or prepackaged to avoid excess salt. ?- Ambulatory blood pressure monitoring encouraged.  Reminded patient that if they ever feel poorly in any way, to check their blood pressure and pulse as well. ?- We will continue to monitor closely alongside PCP/ specialists.  Pt reminded to also f/up with those individuals as instructed by them.  ?- We will continue to monitor symptoms as they relate to the his weight loss journey. ? ?- losartan (COZAAR) 50 MG tablet; Take 1 tablet (50 mg total) by mouth daily.  Dispense: 30 tablet; Refill: 0 ? ?3. Other disorder of eating with emotional eating ?[name] agreed to increase bupropion  to 150 mg BID (up from once daily). Possible risks and benefits were discussed with the patient today. ? ?- buPROPion (WELLBUTRIN SR) 150 MG 12 hr tablet; Take 1 tablet (150 mg total) by mouth 2 (two) times daily.  Dispense: 60 tablet; Refill: 0 ? ?4. At risk for activity intolerance ?[name] was given approximately 9 minutes of exercise intolerance counseling today. He is 61 y.o. male and has risk factors exercise intolerance including obesity. We discussed intensive lifestyle modifications today with an emphasis on specific weight loss instructions and strategies. [name] will slowly increase activity as tolerated. ? ?Repetitive spaced learning was employed today to elicit superior memory formation and behavioral change. ? ?5. Obesity with current BMI of 37.4 ?[name] is currently in the action stage of change. As such, his goal is to continue with weight loss efforts. He has agreed to the Category 1 Plan.  ? ?Goal is to get back to the gym 2 days per week. ? ?Exercise goals: All adults should avoid inactivity. Some physical activity is better than none, and adults who participate in any amount of physical activity gain some health benefits. ? ?Behavioral modification strategies: decreasing eating out, celebration eating strategies, and avoiding temptations. ? ?[name] has agreed to follow-up with our clinic in 3 weeks. He was informed of the importance of frequent follow-up visits to maximize his success with intensive lifestyle modifications for his multiple health conditions.  ? ?Objective:  ? ?Blood pressure 123/74, pulse 98, temperature (!) 97.5 ?F (36.4 ?C), height 5\' 11"  (1.803 m), weight 267 lb (121.1 kg), SpO2 98 %. ?Body mass index is 37.24 kg/m?. ? ?General: Cooperative, alert, well developed, in no acute distress. ?HEENT: Conjunctivae and lids unremarkable. ?Cardiovascular: Regular rhythm.  ?Lungs: Normal work of breathing. ?Neurologic: No focal deficits.  ? ?Lab Results  ?Component Value Date  ?  CREATININE 1.67 (H) 02/22/2021  ? BUN 27 (H) 02/22/2021  ? NA 139 02/22/2021  ? K 4.8 02/22/2021  ? CL 99 02/22/2021  ? CO2 22 02/22/2021  ? ?Lab Results  ?Component Value Date  ? ALT 24 10/31/2020  ? AST 20 10/31/2020  ? ALKPHOS 84 10/31/2020  ? BILITOT 0.9 10/31/2020  ? ?Lab Results  ?Component Value Date  ? HGBA1C 6.9 (H) 02/22/2021  ? HGBA1C 6.4 (H) 10/31/2020  ? HGBA1C 6.0 (H) 07/04/2020  ? HGBA1C 5.6 03/14/2020  ? HGBA1C 5.4 11/17/2019  ? ?Lab Results  ?Component Value Date  ? INSULIN 9.5 10/31/2020  ? INSULIN 28.9 (H) 09/01/2019  ? ?Lab Results  ?Component Value Date  ? TSH 4.170 10/31/2020  ? ?Lab Results  ?Component Value Date  ? CHOL 151 10/31/2020  ? HDL 53 10/31/2020  ? [place] 76 10/31/2020  ? TRIG 122 10/31/2020  ? CHOLHDL 2.9 02/01/2020  ? ?Lab Results  ?Component Value Date  ?  VD25OH 83.5 02/22/2021  ? VD25OH 84.9 10/31/2020  ? VD25OH 58.2 07/04/2020  ? ?Lab Results  ?Component Value Date  ? WBC 7.9 10/31/2020  ? HGB 15.2 10/31/2020  ? HCT 44.8 10/31/2020  ? MCV 91 10/31/2020  ? PLT 285 10/31/2020  ? ?No results found for: IRON, TIBC, FERRITIN ? ?Attestation Statements:  ? ?Reviewed by clinician on day of visit: allergies, medications, problem list, medical history, surgical history, family history, social history, and previous encounter notes. ? ? ?I, Trixie Dredge, am acting as transcriptionist for [employer], DO. ? ?I have reviewed the above documentation for accuracy and completeness, and I agree with the above. Marjory Sneddon, D.O. ? ?The [place] was signed into law in 2016 which includes the topic of electronic health records.  This provides immediate access to information in MyChart.  This includes consultation notes, operative notes, office notes, lab results and pathology reports.  If you have any questions about what you read please let us know at your next visit so we can discuss your concerns and take corrective action if need be.  We are right here with  you. ? ? ?

## 2021-04-17 ENCOUNTER — Ambulatory Visit (INDEPENDENT_AMBULATORY_CARE_PROVIDER_SITE_OTHER): Payer: BC Managed Care – PPO | Admitting: Ophthalmology

## 2021-04-17 ENCOUNTER — Encounter (INDEPENDENT_AMBULATORY_CARE_PROVIDER_SITE_OTHER): Payer: Self-pay | Admitting: Ophthalmology

## 2021-04-17 ENCOUNTER — Other Ambulatory Visit: Payer: Self-pay

## 2021-04-17 DIAGNOSIS — E113412 Type 2 diabetes mellitus with severe nonproliferative diabetic retinopathy with macular edema, left eye: Secondary | ICD-10-CM | POA: Diagnosis not present

## 2021-04-17 DIAGNOSIS — E113411 Type 2 diabetes mellitus with severe nonproliferative diabetic retinopathy with macular edema, right eye: Secondary | ICD-10-CM | POA: Diagnosis not present

## 2021-04-17 DIAGNOSIS — E113413 Type 2 diabetes mellitus with severe nonproliferative diabetic retinopathy with macular edema, bilateral: Secondary | ICD-10-CM

## 2021-04-17 MED ORDER — BEVACIZUMAB 2.5 MG/0.1ML IZ SOSY
2.5000 mg | PREFILLED_SYRINGE | INTRAVITREAL | Status: AC | PRN
  Administered 2021-04-17: 2.5 mg via INTRAVITREAL

## 2021-04-17 NOTE — Assessment & Plan Note (Signed)
CSME inferiorly, will need repeat injection today and evaluate again in 5 to 6 weeks ?

## 2021-04-17 NOTE — Assessment & Plan Note (Signed)
Slightly improved today some 1.5 weeks post most recent injection, will need repeat evaluation in 4 weeks OD ?

## 2021-04-17 NOTE — Progress Notes (Signed)
04/17/2021     CHIEF COMPLAINT Patient presents for  Chief Complaint  Patient presents with   Diabetic Retinopathy with Macular Edema      HISTORY OF PRESENT ILLNESS: [name] is a 61 y.o. male who presents to the clinic today for:   HPI   1 week, 5 days Dilate OS, Avastin OCT. Patient states vision is stable and unchanged since last visit. Denies any new floaters or FOL.  Last edited by Nelva NayKronstein, Anna N on 04/17/2021  9:53 AM.      Referring physician: Marden NobleGates, Kody, MD [address] [place] [id number]  HISTORICAL INFORMATION:   Selected notes from the MEDICAL RECORD NUMBER    Lab Results  Component Value Date   HGBA1C 6.9 (H) 02/22/2021     CURRENT MEDICATIONS: No current outpatient medications on file. (Ophthalmic Drugs)   No current facility-administered medications for this visit. (Ophthalmic Drugs)   Current Outpatient Medications (Other)  Medication Sig   albuterol (VENTOLIN HFA) 108 (90 Base) MCG/ACT inhaler Inhale 2 puffs into the lungs every 6 (six) hours as needed for wheezing or shortness of breath.   amLODipine (NORVASC) 5 MG tablet Take 5 mg by mouth daily.   aspirin EC 81 MG tablet Take 81 mg by mouth daily.   atorvastatin (LIPITOR) 10 MG tablet Take 1 tablet by mouth once daily   buPROPion (WELLBUTRIN SR) 150 MG 12 hr tablet Take 1 tablet (150 mg total) by mouth 2 (two) times daily.   cetirizine (ZYRTEC) 10 MG tablet Take 10 mg by mouth daily.   Cholecalciferol (VITAMIN D) 50 MCG (2000 UT) tablet Take 4,000 Units by mouth daily.    cloNIDine (CATAPRES) 0.2 MG tablet Take 0.2 mg by mouth every evening.    glimepiride (AMARYL) 2 MG tablet TAKE 1 TABLET BY MOUTH IN THE MORNING AND AT BEDTIME   loratadine (CLARITIN) 10 MG tablet Take 10 mg by mouth daily.    losartan (COZAAR) 50 MG tablet Take 1 tablet (50 mg total) by mouth daily.   Magnesium 400 MG CAPS Take 400 mg by mouth daily.    metFORMIN (GLUCOPHAGE-XR) 500 MG 24  hr tablet Take 1,000 mg by mouth 2 (two) times daily.    pantoprazole (PROTONIX) 40 MG tablet TAKE 1 TABLET BY MOUTH TWICE DAILY (Patient taking differently: Take 40 mg by mouth 2 (two) times daily.)   Spacer/Aero-Holding Chambers (AEROCHAMBER MV) inhaler Use as instructed (Patient taking differently: as needed. Use as instructed for cough)   tirzepatide (MOUNJARO) 12.5 MG/0.5ML Pen Inject 12.5 mg into the skin once a week.   vitamin B-12 (CYANOCOBALAMIN) 500 MCG tablet Take 500 mcg by mouth daily.    Vitamin D, Ergocalciferol, (DRISDOL) 1.25 MG (50000 UNIT) CAPS capsule 1 po q 2 wks   No current facility-administered medications for this visit. (Other)      REVIEW OF SYSTEMS:    ALLERGIES No Known Allergies  PAST MEDICAL HISTORY Past Medical History:  Diagnosis Date   ADD (attention deficit disorder)    Anginal pain (HCC) 2012   Arthritis, shoulder region    B12 deficiency    Back pain    Coronary artery disease    Diabetes (HCC)    Diabetic retinopathy (HCC)    GERD (gastroesophageal reflux disease)    H/O heart artery stent 04/2010   Hyperlipidemia    Hypertension    Joint pain    Knee pain    Neuropathy  NSTEMI (non-ST elevated myocardial infarction) (HCC)    Obesity    Osteoarthritis    Persistent cough    stopped for a year   Sleep apnea    Swallowing difficulty    Swelling of both lower extremities    Past Surgical History:  Procedure Laterality Date   ACHILLES TENDON REPAIR Right    AMPUTATION TOE Right 03/14/2019   Procedure: AMPUTATION TOE, 5th toe;  Surgeon: [name], DPM;  Location: [facility] OR;  Service: Podiatry;  Laterality: Right;   heart stent  2012   x1   INCISION AND DRAINAGE Right 03/14/2019   Procedure: INCISION AND DRAINAGE;  Surgeon: [name], DPM;  Location: [facility] OR;  Service: Podiatry;  Laterality: Right;   SHOULDER ARTHROSCOPY Right    TEE WITHOUT CARDIOVERSION N/A 03/17/2019   Procedure: TRANSESOPHAGEAL ECHOCARDIOGRAM (TEE);   Surgeon: Chilton Si, MD;  Location: [facility] ENDOSCOPY;  Service: Cardiovascular;  Laterality: N/A;   TOE SURGERY Right 06/2018   for a hammer toe   TOTAL SHOULDER ARTHROPLASTY Right 11/19/2019   Procedure: TOTAL SHOULDER ARTHROPLASTY;  Surgeon: [name], MD;  Location: [facility] ORS;  Service: Orthopedics;  Laterality: Right;   UVULOPALATOPHARYNGOPLASTY      FAMILY HISTORY Family History  Problem Relation Age of Onset   Cancer Other    Diabetes Other    Heart disease Other    Breast cancer Mother    Cancer Mother    Heart disease Father    Diabetes Father    Stroke Father    Leukemia Brother    Asthma Maternal Uncle     SOCIAL HISTORY Social History   Tobacco Use   Smoking status: Never    Passive exposure: Yes   Smokeless tobacco: Never   Tobacco comments:    Both parents growing up.   Vaping Use   Vaping Use: Never used  Substance Use Topics   Alcohol use: Yes    Comment: 4-5 on weekends   Drug use: No         OPHTHALMIC EXAM:  Base Eye Exam     Visual Acuity (ETDRS)       Right Left   Dist cc 20/20 20/20    Correction: Glasses         Tonometry (Tonopen, 9:54 AM)       Right Left   Pressure 18 17         Pupils       Pupils Dark Light APD   Right PERRL 4 3 None   Left PERRL 4 3 None         Extraocular Movement       Right Left    Full Full         Neuro/Psych     Oriented x3: Yes   Mood/Affect: Normal         Dilation     Left eye: 1.0% Mydriacyl, 2.5% Phenylephrine @ 9:54 AM           Slit Lamp and Fundus Exam     External Exam       Right Left   External Normal Normal         Slit Lamp Exam       Right Left   Lids/Lashes Normal Normal   Conjunctiva/Sclera White and quiet White and quiet   Cornea Clear Clear   Anterior Chamber Deep and quiet Deep and quiet   Iris Round and reactive Round and reactive   Lens 1+ Nuclear  sclerosis 1+ Nuclear sclerosis   Anterior Vitreous Normal Normal          Fundus Exam       Right Left   Posterior Vitreous  Normal   Disc  Normal   C/D Ratio  0.35   Macula  Focal laser scars, Microaneurysms, minor thickening inferior to FAZ, not center involved   Vessels  NPDR-Severe, with no cotton wool   Periphery  Normal            IMAGING AND PROCEDURES  Imaging and Procedures for 04/17/21  OCT, Retina - OU - Both Eyes       Right Eye Quality was good. Scan locations included subfoveal. Central Foveal Thickness: 329. Progression has improved. Findings include abnormal foveal contour.   Left Eye Quality was good. Scan locations included subfoveal. Central Foveal Thickness: 324. Progression has been stable. Findings include abnormal foveal contour.   Notes Diabetic CSME with focal leakages temporal OS and inferior to the fovea OS, and diabetic CSME temporally continue  CSME OD, OD slightly improved some 1 week post most recent injection we will need repeat evaluation     Intravitreal Injection, Pharmacologic Agent - OS - Left Eye       Time Out 04/17/2021. 11:11 AM. Confirmed correct patient, procedure, site, and patient consented.   Anesthesia Topical anesthesia was used. Anesthetic medications included Lidocaine 4%.   Procedure Preparation included 5% betadine to ocular surface, 10% betadine to eyelids. A 30 gauge needle was used.   Injection: 2.5 mg bevacizumab 2.5 MG/0.1ML   Route: Intravitreal, Site: Left Eye   NDC: [phone], Lot: [id number]   Post-op Post injection exam found visual acuity of at least counting fingers. The patient tolerated the procedure well. There were no complications. The patient received written and verbal post procedure care education. Post injection medications were not given.              ASSESSMENT/PLAN:  Severe nonproliferative diabetic retinopathy of left eye, with macular edema, associated with type 2 diabetes mellitus (HCC) CSME inferiorly, will need repeat injection today and  evaluate again in 5 to 6 weeks  Severe nonproliferative diabetic retinopathy of right eye, with macular edema, associated with type 2 diabetes mellitus (HCC) Slightly improved today some 1.5 weeks post most recent injection, will need repeat evaluation in 4 weeks OD     ICD-10-CM   1. Severe nonproliferative diabetic retinopathy of left eye, with macular edema, associated with type 2 diabetes mellitus (HCC)  E11.3412 OCT, Retina - OU - Both Eyes    Intravitreal Injection, Pharmacologic Agent - OS - Left Eye    bevacizumab (AVASTIN) SOSY 2.5 mg    2. Severe nonproliferative diabetic retinopathy of right eye, with macular edema, associated with type 2 diabetes mellitus (HCC)  E11.3411       1.  CSME OD slightly improved from 1.5 weeks post most recent injection, commence therapy again today left eye.  2.  All discussed OU is to reduce the CSME/to deliver focal laser treatments into these new areas so as to diminish the risk of ongoing need for therapy  3.  Ophthalmic Meds Ordered this visit:  Meds ordered this encounter  Medications   bevacizumab (AVASTIN) SOSY 2.5 mg       Return in about 4 weeks (around 05/15/2021) for dilate, OD, AVASTIN OCT,, and 6 weeks OCT evaluation with Avastin OS.  There are no Patient Instructions on file for this visit.   Explained the diagnoses, plan,  and follow up with the patient and they expressed understanding.  Patient expressed understanding of the importance of proper follow up care.   Alford Highland Trenity Pha M.D. Diseases & Surgery of the Retina and Vitreous [facility] 04/17/21     Abbreviations: M myopia (nearsighted); A astigmatism; H hyperopia (farsighted); P presbyopia; Mrx spectacle prescription;  CTL contact lenses; OD right eye; OS left eye; OU both eyes  XT exotropia; ET esotropia; PEK punctate epithelial keratitis; PEE punctate epithelial erosions; DES dry eye syndrome; MGD meibomian gland dysfunction; ATs artificial  tears; PFAT's preservative free artificial tears; NSC nuclear sclerotic cataract; PSC posterior subcapsular cataract; ERM epi-retinal membrane; PVD posterior vitreous detachment; RD retinal detachment; DM diabetes mellitus; DR diabetic retinopathy; NPDR non-proliferative diabetic retinopathy; PDR proliferative diabetic retinopathy; CSME clinically significant macular edema; DME diabetic macular edema; dbh dot blot hemorrhages; CWS cotton wool spot; POAG primary open angle glaucoma; C/D cup-to-disc ratio; HVF humphrey visual field; GVF goldmann visual field; OCT optical coherence tomography; IOP intraocular pressure; BRVO Branch retinal vein occlusion; CRVO central retinal vein occlusion; CRAO central retinal artery occlusion; BRAO branch retinal artery occlusion; RT retinal tear; SB scleral buckle; PPV pars plana vitrectomy; VH Vitreous hemorrhage; PRP panretinal laser photocoagulation; IVK intravitreal kenalog; VMT vitreomacular traction; MH Macular hole;  NVD neovascularization of the disc; NVE neovascularization elsewhere; AREDS age related eye disease study; ARMD age related macular degeneration; POAG primary open angle glaucoma; EBMD epithelial/anterior basement membrane dystrophy; ACIOL anterior chamber intraocular lens; IOL intraocular lens; PCIOL posterior chamber intraocular lens; Phaco/IOL phacoemulsification with intraocular lens placement; PRK photorefractive keratectomy; LASIK laser assisted in situ keratomileusis; HTN hypertension; DM diabetes mellitus; COPD chronic obstructive pulmonary disease

## 2021-05-03 ENCOUNTER — Ambulatory Visit (INDEPENDENT_AMBULATORY_CARE_PROVIDER_SITE_OTHER): Payer: BC Managed Care – PPO | Admitting: Family Medicine

## 2021-05-03 ENCOUNTER — Other Ambulatory Visit: Payer: Self-pay

## 2021-05-03 ENCOUNTER — Encounter (INDEPENDENT_AMBULATORY_CARE_PROVIDER_SITE_OTHER): Payer: Self-pay | Admitting: Family Medicine

## 2021-05-03 VITALS — BP 127/81 | HR 96 | Temp 97.4°F | Ht 71.0 in | Wt 268.0 lb

## 2021-05-03 DIAGNOSIS — E1159 Type 2 diabetes mellitus with other circulatory complications: Secondary | ICD-10-CM | POA: Diagnosis not present

## 2021-05-03 DIAGNOSIS — I152 Hypertension secondary to endocrine disorders: Secondary | ICD-10-CM | POA: Diagnosis not present

## 2021-05-03 DIAGNOSIS — E1169 Type 2 diabetes mellitus with other specified complication: Secondary | ICD-10-CM

## 2021-05-03 DIAGNOSIS — Z6841 Body Mass Index (BMI) 40.0 and over, adult: Secondary | ICD-10-CM

## 2021-05-03 DIAGNOSIS — F5089 Other specified eating disorder: Secondary | ICD-10-CM | POA: Diagnosis not present

## 2021-05-03 DIAGNOSIS — Z7984 Long term (current) use of oral hypoglycemic drugs: Secondary | ICD-10-CM

## 2021-05-03 DIAGNOSIS — Z9189 Other specified personal risk factors, not elsewhere classified: Secondary | ICD-10-CM

## 2021-05-03 MED ORDER — GLIMEPIRIDE 2 MG PO TABS: ORAL_TABLET | ORAL | 0 refills | Status: DC

## 2021-05-03 MED ORDER — BUPROPION HCL ER (SR) 150 MG PO TB12: 150.0000 mg | ORAL_TABLET | Freq: Two times a day (BID) | ORAL | 0 refills | Status: DC

## 2021-05-03 MED ORDER — LOSARTAN POTASSIUM 50 MG PO TABS: 50.0000 mg | ORAL_TABLET | Freq: Every day | ORAL | 0 refills | Status: DC

## 2021-05-03 MED ORDER — TIRZEPATIDE 12.5 MG/0.5ML ~~LOC~~ SOAJ: 12.5000 mg | SUBCUTANEOUS | 0 refills | Status: DC

## 2021-05-08 NOTE — Progress Notes (Signed)
? ? ? ?Chief Complaint:  ? ?OBESITY ?[name] is here to discuss his progress with his obesity treatment plan along with follow-up of his obesity related diagnoses. [name] is on the Category 1 Plan and states he is following his eating plan approximately 50% of the time. [name] states he is not currently exercising. ? ?Today's visit was #: 54 ?Starting weight: 310 lbs ?Starting date: 09/01/2019 ?Today's weight: 268 lbs ?Today's date: 05/03/2021 ?Total lbs lost to date: 63 ?Total lbs lost since last in-office visit: 0 ? ?Interim History: [name] still goes out and eats/drinks on the weekends, and weekdays he does really well. He hasn't gotten back to the gym. ? ?Subjective:  ? ?1. Type 2 diabetes mellitus with other specified complication, without long-term current use of insulin ([place]) ?[name]'s blood sugars are improving 04/12/2021 at his PCP's office his A1c was 6.5 (down from 6.9). his fasting blood sugars running between 98-157. He is asymptomatic with no concerns. He denies the need for a dose increase in [name]. ? ?2. Hypertension associated with type 2 diabetes mellitus ([name]) ?[name]'s blood pressure is at goal today. ? ?3. Other disorder of eating with emotional eating ?We increased [name]'s Wellbutrin at his last office visit to BID. He notes much less snacking. ? ?4. At risk for activity intolerance ?[name] is at risk for exercise intolerance due to inactivity.  ? ?Assessment/Plan:  ?No orders of the defined types were placed in this encounter. ? ? ?Medications Discontinued During This Encounter  ?Medication Reason  ? buPROPion (WELLBUTRIN SR) 150 MG 12 hr tablet Reorder  ? glimepiride (AMARYL) 2 MG tablet Reorder  ? losartan (COZAAR) 50 MG tablet Reorder  ? tirzepatide (MOUNJARO) 12.5 MG/0.5ML Pen Reorder  ?  ? ?Meds ordered this encounter  ?Medications  ? buPROPion (WELLBUTRIN SR) 150 MG 12 hr tablet  ?  Sig: Take 1 tablet (150 mg total) by mouth 2 (two) times daily.  ?  Dispense:  60 tablet  ?  Refill:  0  ?  glimepiride (AMARYL) 2 MG tablet  ?  Sig: TAKE 1 TABLET BY MOUTH IN THE MORNING AND AT BEDTIME  ?  Dispense:  60 tablet  ?  Refill:  0  ? losartan (COZAAR) 50 MG tablet  ?  Sig: Take 1 tablet (50 mg total) by mouth daily.  ?  Dispense:  30 tablet  ?  Refill:  0  ? tirzepatide (MOUNJARO) 12.5 MG/0.5ML Pen  ?  Sig: Inject 12.5 mg into the skin once a week.  ?  Dispense:  2 mL  ?  Refill:  0  ?  ? ?1. Type 2 diabetes mellitus with other specified complication, without long-term current use of insulin ([name]) ?[name] will continue his medications, and we will refill Mounjaro and Amaryl for 1 month. ? ?- glimepiride (AMARYL) 2 MG tablet; TAKE 1 TABLET BY MOUTH IN THE MORNING AND AT BEDTIME  Dispense: 60 tablet; Refill: 0 ?- tirzepatide (MOUNJARO) 12.5 MG/0.5ML Pen; Inject 12.5 mg into the skin once a week.  Dispense: 2 mL; Refill: 0 ? ?2. Hypertension associated with type 2 diabetes mellitus ([name]) ?[name] will continue his blood pressure medications, and we will refill losartan for 1 month. ? ?- losartan (COZAAR) 50 MG tablet; Take 1 tablet (50 mg total) by mouth daily.  Dispense: 30 tablet; Refill: 0 ? ?3. Other disorder of eating with emotional eating ?We will refill Wellbutrin SR 150 mg for 1 month. Symptoms are well controlled and he is doing better with  increased dose and increased exercise. ? ?- buPROPion (WELLBUTRIN SR) 150 MG 12 hr tablet; Take 1 tablet (150 mg total) by mouth 2 (two) times daily.  Dispense: 60 tablet; Refill: 0 ? ?4. At risk for activity intolerance ?[name] was given approximately 9 minutes of exercise intolerance counseling today. He is 61 y.o. male and has risk factors exercise intolerance including obesity. We discussed intensive lifestyle modifications today with an emphasis on specific weight loss instructions and strategies. [name] will slowly increase activity as tolerated. ? ?Repetitive spaced learning was employed today to elicit superior memory formation and behavioral change. ? ?5.  Obesity with current BMI of 37.5 ?[name] is currently in the action stage of change. As such, his goal is to continue with weight loss efforts. He has agreed to the Category 1 Plan.  ? ?Start back 2 days per week at the gym. He desires to add structure to is life and stick to the meal plan more.  ? ?Exercise goals: All adults should avoid inactivity. Some physical activity is better than none, and adults who participate in any amount of physical activity gain some health benefits. ? ?Behavioral modification strategies: decreasing eating out and planning for success. ? ?[name] has agreed to follow-up with our clinic in 3 weeks. He was informed of the importance of frequent follow-up visits to maximize his success with intensive lifestyle modifications for his multiple health conditions.  ? ?Objective:  ? ?Blood pressure 127/81, pulse 96, temperature (!) 97.4 ?F (36.3 ?C), height 5\' 11"  (1.803 m), weight 268 lb (121.6 kg), SpO2 100 %. ?Body mass index is 37.38 kg/m?. ? ?General: Cooperative, alert, well developed, in no acute distress. ?HEENT: Conjunctivae and lids unremarkable. ?Cardiovascular: Regular rhythm.  ?Lungs: Normal work of breathing. ?Neurologic: No focal deficits.  ? ?Lab Results  ?Component Value Date  ? CREATININE 1.67 (H) 02/22/2021  ? BUN 27 (H) 02/22/2021  ? NA 139 02/22/2021  ? K 4.8 02/22/2021  ? CL 99 02/22/2021  ? CO2 22 02/22/2021  ? ?Lab Results  ?Component Value Date  ? ALT 24 10/31/2020  ? AST 20 10/31/2020  ? ALKPHOS 84 10/31/2020  ? BILITOT 0.9 10/31/2020  ? ?Lab Results  ?Component Value Date  ? HGBA1C 6.9 (H) 02/22/2021  ? HGBA1C 6.4 (H) 10/31/2020  ? HGBA1C 6.0 (H) 07/04/2020  ? HGBA1C 5.6 03/14/2020  ? HGBA1C 5.4 11/17/2019  ? ?Lab Results  ?Component Value Date  ? INSULIN 9.5 10/31/2020  ? INSULIN 28.9 (H) 09/01/2019  ? ?Lab Results  ?Component Value Date  ? TSH 4.170 10/31/2020  ? ?Lab Results  ?Component Value Date  ? CHOL 151 10/31/2020  ? HDL 53 10/31/2020  ? [place] 76 10/31/2020   ? TRIG 122 10/31/2020  ? CHOLHDL 2.9 02/01/2020  ? ?Lab Results  ?Component Value Date  ? VD25OH 83.5 02/22/2021  ? VD25OH 84.9 10/31/2020  ? VD25OH 58.2 07/04/2020  ? ?Lab Results  ?Component Value Date  ? WBC 7.9 10/31/2020  ? HGB 15.2 10/31/2020  ? HCT 44.8 10/31/2020  ? MCV 91 10/31/2020  ? PLT 285 10/31/2020  ? ?No results found for: IRON, TIBC, FERRITIN ? ?Attestation Statements:  ? ?Reviewed by clinician on day of visit: allergies, medications, problem list, medical history, surgical history, family history, social history, and previous encounter notes. ? ? ?I, Trixie Dredge, am acting as transcriptionist for [employer], DO. ? ?I have reviewed the above documentation for accuracy and completeness, and I agree with the above. Neoma Laming  J Nykolas Bacallao, D.O. ? ?The [place] was signed into law in 2016 which includes the topic of electronic health records.  This provides immediate access to information in MyChart.  This includes consultation notes, operative notes, office notes, lab results and pathology reports.  If you have any questions about what you read please let us know at your next visit so we can discuss your concerns and take corrective action if need be.  We are right here with you. ? ? ?

## 2021-05-09 ENCOUNTER — Telehealth: Payer: Self-pay

## 2021-05-09 NOTE — Telephone Encounter (Signed)
Pt scheduled for pick up

## 2021-05-09 NOTE — Telephone Encounter (Signed)
Patient called to check on the status of his orthotics, he was told it would be 4 weeks ?  Please call

## 2021-05-11 ENCOUNTER — Ambulatory Visit: Payer: BC Managed Care – PPO

## 2021-05-11 DIAGNOSIS — M14679 Charcot's joint, unspecified ankle and foot: Secondary | ICD-10-CM

## 2021-05-11 DIAGNOSIS — Z89421 Acquired absence of other right toe(s): Secondary | ICD-10-CM

## 2021-05-11 DIAGNOSIS — E1140 Type 2 diabetes mellitus with diabetic neuropathy, unspecified: Secondary | ICD-10-CM

## 2021-05-11 NOTE — Progress Notes (Signed)
SITUATION: ?Reason for Visit: Fitting and Delivery of Custom Fabricated Foot Orthoses ?Patient Report: Patient reports comfort and is satisfied with device. ? ?OBJECTIVE DATA: ?Patient History / Diagnosis:   ?  ICD-10-CM   ?1. Type 2 diabetes mellitus with diabetic neuropathy, without long-term current use of insulin (HCC)  E11.40   ?  ?2. Charcot's joint of foot, unspecified laterality  M14.679   ?  ?3. History of amputation of lesser toe of right foot (HCC)  Z89.421   ?  ? ? ?Provided Device:  Custom Functional Foot Orthotics ?    [place]ichey[place]: [id number] ? ?GOAL OF ORTHOSIS ?- Improve gait ?- Decrease energy expenditure ?- Improve Balance ?- Provide Triplanar stability of foot complex ?- Facilitate motion ? ?ACTIONS PERFORMED ?Patient was fit with foot orthotics trimmed to shoe last. Patient tolerated fittign procedure.  ? ?Patient was provided with verbal and written instruction and demonstration regarding donning, doffing, wear, care, proper fit, function, purpose, cleaning, and use of the orthosis and in all related precautions and risks and benefits regarding the orthosis. ? ?Patient was also provided with verbal instruction regarding how to report any failures or malfunctions of the orthosis and necessary follow up care. Patient was also instructed to contact our office regarding any change in status that may affect the function of the orthosis. ? ?Patient demonstrated independence with proper donning, doffing, and fit and verbalized understanding of all instructions. ? ?PLAN: ?Patient is to follow up in one week or as necessary (PRN). All questions were answered and concerns addressed. Plan of care was discussed with and agreed upon by the patient. ? ?

## 2021-05-15 ENCOUNTER — Ambulatory Visit (INDEPENDENT_AMBULATORY_CARE_PROVIDER_SITE_OTHER): Payer: BC Managed Care – PPO | Admitting: Ophthalmology

## 2021-05-15 ENCOUNTER — Encounter (INDEPENDENT_AMBULATORY_CARE_PROVIDER_SITE_OTHER): Payer: Self-pay | Admitting: Ophthalmology

## 2021-05-15 DIAGNOSIS — H2513 Age-related nuclear cataract, bilateral: Secondary | ICD-10-CM

## 2021-05-15 DIAGNOSIS — E113411 Type 2 diabetes mellitus with severe nonproliferative diabetic retinopathy with macular edema, right eye: Secondary | ICD-10-CM | POA: Diagnosis not present

## 2021-05-15 DIAGNOSIS — E113412 Type 2 diabetes mellitus with severe nonproliferative diabetic retinopathy with macular edema, left eye: Secondary | ICD-10-CM | POA: Diagnosis not present

## 2021-05-15 MED ORDER — BEVACIZUMAB 2.5 MG/0.1ML IZ SOSY
2.5000 mg | PREFILLED_SYRINGE | INTRAVITREAL | Status: AC | PRN
  Administered 2021-05-15: 2.5 mg via INTRAVITREAL

## 2021-05-15 NOTE — Progress Notes (Signed)
? ? ?05/15/2021 ? ?  ? ?CHIEF COMPLAINT ?Patient presents for  ?Chief Complaint  ?Patient presents with  ? Diabetic Retinopathy with Macular Edema  ? ? ? ? ?HISTORY OF PRESENT ILLNESS: ?[name] is a 61 y.o. male who presents to the clinic today for:  ? ?HPI   ?4 weeks dilate OD, Avastin OCT. ? ?Last edited by Nelva NayKronstein, Anna N on 05/15/2021  8:17 AM.  ?  ? ? ?Referring physician: ?Marden NobleGates, Merlin, MD ?[address] ?[address] ?[place],  [place] [id number] ? ?HISTORICAL INFORMATION:  ? ?Selected notes from the MEDICAL RECORD NUMBER ?  ? ?Lab Results  ?Component Value Date  ? HGBA1C 6.9 (H) 02/22/2021  ?  ? ?CURRENT MEDICATIONS: ?No current outpatient medications on file. (Ophthalmic Drugs)  ? ?No current facility-administered medications for this visit. (Ophthalmic Drugs)  ? ?Current Outpatient Medications (Other)  ?Medication Sig  ? albuterol (VENTOLIN HFA) 108 (90 Base) MCG/ACT inhaler Inhale 2 puffs into the lungs every 6 (six) hours as needed for wheezing or shortness of breath.  ? amLODipine (NORVASC) 5 MG tablet Take 5 mg by mouth daily.  ? aspirin EC 81 MG tablet Take 81 mg by mouth daily.  ? atorvastatin (LIPITOR) 10 MG tablet Take 1 tablet by mouth once daily  ? buPROPion (WELLBUTRIN SR) 150 MG 12 hr tablet Take 1 tablet (150 mg total) by mouth 2 (two) times daily.  ? cetirizine (ZYRTEC) 10 MG tablet Take 10 mg by mouth daily.  ? Cholecalciferol (VITAMIN D) 50 MCG (2000 UT) tablet Take 4,000 Units by mouth daily.   ? cloNIDine (CATAPRES) 0.2 MG tablet Take 0.2 mg by mouth every evening.   ? glimepiride (AMARYL) 2 MG tablet TAKE 1 TABLET BY MOUTH IN THE MORNING AND AT BEDTIME  ? loratadine (CLARITIN) 10 MG tablet Take 10 mg by mouth daily.   ? losartan (COZAAR) 50 MG tablet Take 1 tablet (50 mg total) by mouth daily.  ? Magnesium 400 MG CAPS Take 400 mg by mouth daily.   ? metFORMIN (GLUCOPHAGE-XR) 500 MG 24 hr tablet Take 1,000 mg by mouth 2 (two) times daily.   ? pantoprazole (PROTONIX) 40 MG tablet TAKE 1  TABLET BY MOUTH TWICE DAILY (Patient taking differently: Take 40 mg by mouth 2 (two) times daily.)  ? Spacer/Aero-Holding Chambers (AEROCHAMBER MV) inhaler Use as instructed (Patient taking differently: as needed. Use as instructed for cough)  ? tirzepatide (MOUNJARO) 12.5 MG/0.5ML Pen Inject 12.5 mg into the skin once a week.  ? vitamin B-12 (CYANOCOBALAMIN) 500 MCG tablet Take 500 mcg by mouth daily.   ? Vitamin D, Ergocalciferol, (DRISDOL) 1.25 MG (50000 UNIT) CAPS capsule 1 po q 2 wks  ? ?No current facility-administered medications for this visit. (Other)  ? ? ? ? ?REVIEW OF SYSTEMS: ?ROS   ?Negative for: Constitutional, Gastrointestinal, Neurological, Skin, Genitourinary, Musculoskeletal, HENT, Endocrine, Cardiovascular, Eyes, Respiratory, Psychiatric, Allergic/Imm, Heme/Lymph ?Last edited by Edmon Crapeankin, Sianne Tejada A, MD on 05/15/2021  8:57 AM.  ?  ? ? ? ?ALLERGIES ?No Known Allergies ? ?PAST MEDICAL HISTORY ?Past Medical History:  ?Diagnosis Date  ? ADD (attention deficit disorder)   ? Anginal pain (HCC) 2012  ? Arthritis, shoulder region   ? B12 deficiency   ? Back pain   ? Coronary artery disease   ? Diabetes (HCC)   ? Diabetic retinopathy (HCC)   ? GERD (gastroesophageal reflux disease)   ? H/O heart artery stent 04/2010  ? Hyperlipidemia   ? Hypertension   ?  Joint pain   ? Knee pain   ? Neuropathy   ? NSTEMI (non-ST elevated myocardial infarction) (HCC)   ? Obesity   ? Osteoarthritis   ? Persistent cough   ? stopped for a year  ? Sleep apnea   ? Swallowing difficulty   ? Swelling of both lower extremities   ? ?Past Surgical History:  ?Procedure Laterality Date  ? ACHILLES TENDON REPAIR Right   ? AMPUTATION TOE Right 03/14/2019  ? Procedure: AMPUTATION TOE, 5th toe;  Surgeon: [name], DPM;  Location: [facility] OR;  Service: Podiatry;  Laterality: Right;  ? heart stent  2012  ? x1  ? INCISION AND DRAINAGE Right 03/14/2019  ? Procedure: INCISION AND DRAINAGE;  Surgeon: [name], DPM;  Location: [facility] OR;  Service:  Podiatry;  Laterality: Right;  ? SHOULDER ARTHROSCOPY Right   ? TEE WITHOUT CARDIOVERSION N/A 03/17/2019  ? Procedure: TRANSESOPHAGEAL ECHOCARDIOGRAM (TEE);  Surgeon: Chilton Si, MD;  Location: [facility] END[facility]COPY;  Service: Cardiovascular;  Laterality: N/A;  ? TOE SURGERY Right 06/2018  ? for a hammer toe  ? TOTAL SHOULDER ARTHROPLASTY Right 11/19/2019  ? Procedure: TOTAL SHOULDER ARTHROPLASTY;  Surgeon: [name], MD;  Location: [facility] ORS;  Service: Orthopedics;  Laterality: Right;  ? UVULOPALATOPHARYNGOPLASTY    ? ? ?FAMILY HISTORY ?Family History  ?Problem Relation Age of Onset  ? Cancer Other   ? Diabetes Other   ? Heart disease Other   ? Breast cancer Mother   ? Cancer Mother   ? Heart disease Father   ? Diabetes Father   ? Stroke Father   ? Leukemia Brother   ? Asthma Maternal Uncle   ? ? ?SOCIAL HISTORY ?Social History  ? ?Tobacco Use  ? Smoking status: Never  ?  Passive exposure: Yes  ? Smokeless tobacco: Never  ? Tobacco comments:  ?  Both parents growing up.   ?Vaping Use  ? Vaping Use: Never used  ?Substance Use Topics  ? Alcohol use: Yes  ?  Comment: 4-5 on weekends  ? Drug use: No  ? ?  ? ?  ? ?OPHTHALMIC EXAM: ? ?Base Eye Exam   ? ? Visual Acuity (ETDRS)   ? ?   Right Left  ? Dist cc 20/20 20/20  ? ? Correction: Glasses  ? ?  ?  ? ? Tonometry (Tonopen, 8:21 AM)   ? ?   Right Left  ? Pressure 14 13  ? ?  ?  ? ? Pupils   ? ?   Pupils Dark Light APD  ? Right PERRL 4 3 None  ? Left PERRL 4 3 None  ? ?  ?  ? ? Extraocular Movement   ? ?   Right Left  ?  Full Full  ? ?  ?  ? ? Neuro/Psych   ? ? Oriented x3: Yes  ? Mood/Affect: Normal  ? ?  ?  ? ? Dilation   ? ? Right eye: 1.0% Mydriacyl, 2.5% Phenylephrine @ 8:21 AM  ? ?  ?  ? ?  ? ?Slit Lamp and Fundus Exam   ? ? External Exam   ? ?   Right Left  ? External Normal Normal  ? ?  ?  ? ? Slit Lamp Exam   ? ?   Right Left  ? Lids/Lashes Normal Normal  ? Conjunctiva/Sclera White and quiet White and quiet  ? Cornea Clear Clear  ? Anterior Chamber Deep and quiet  Deep and  quiet  ? Iris Round and reactive Round and reactive  ? Lens 1+ Nuclear sclerosis 1+ Nuclear sclerosis  ? Anterior Vitreous Normal Normal  ? ?  ?  ? ? Fundus Exam   ? ?   Right Left  ? Posterior Vitreous Normal   ? Disc Normal   ? C/D Ratio 0.35   ? Macula Focal laser scars, Microaneurysms minor retinal thickening inferior improved   ? Vessels NPDR-Severe, with no cotton wool   ? Periphery Normal   ? ?  ?  ? ?  ? ? ?IMAGING AND PROCEDURES  ?Imaging and Procedures for 05/15/21 ? ?Intravitreal Injection, Pharmacologic Agent - OD - Right Eye   ? ?   ?Time Out ?05/15/2021. 9:00 AM. Confirmed correct patient, procedure, site, and patient consented.  ? ?Anesthesia ?Topical anesthesia was used. Anesthetic medications included Lidocaine 4%.  ? ?Procedure ?Preparation included 5% betadine to ocular surface, 10% betadine to eyelids. A 30 gauge needle was used.  ? ?Injection: ?2.5 mg bevacizumab 2.5 MG/0.1ML ?  Route: Intravitreal, Site: Right Eye ?  NDC: 33007-622-63, Lot: [id number]  ? ?Post-op ?Post injection exam found visual acuity of at least counting fingers. The patient tolerated the procedure well. There were no complications. The patient received written and verbal post procedure care education. Post injection medications included ocuflox.  ? ?  ? ?OCT, Retina - OU - Both Eyes   ? ?   ?Right Eye ?Quality was good. Scan locations included subfoveal. Central Foveal Thickness: 321. Progression has improved. Findings include abnormal foveal contour.  ? ?Left Eye ?Quality was good. Scan locations included subfoveal. Central Foveal Thickness: 320. Progression has been stable. Findings include abnormal foveal contour.  ? ?Notes ?Diabetic CSME with focal leakages temporal OS and inferior to the fovea OS, and diabetic CSME temporally continue slightly 4 weeks postinjection ? ?CSME OD, OD slightly improved some 5 week post most recent injection we will need repeat evaluation, and today ? ?  ? ? ?  ?  ? ?   ?ASSESSMENT/PLAN: ? ?Severe nonproliferative diabetic retinopathy of left eye, with macular edema, associated with type 2 diabetes mellitus (HCC) ?OS at 4 weeks post injection Avastin, with center threatening CSME.  We will

## 2021-05-15 NOTE — Assessment & Plan Note (Signed)
OS at 4 weeks post injection Avastin, with center threatening CSME.  We will plan on delivering focal laser treatment to this local region inferiorly to decrease ongoing treatment burden OS ?

## 2021-05-15 NOTE — Assessment & Plan Note (Signed)
Center threatening CSME OD.,  Will treat OD today with Avastin to maintain and further improvement and likely focal laser treatment the coming weeks again to decrease treatment burden long-term ?

## 2021-05-15 NOTE — Assessment & Plan Note (Signed)
Moderate OU no change over time ?

## 2021-05-22 ENCOUNTER — Ambulatory Visit (INDEPENDENT_AMBULATORY_CARE_PROVIDER_SITE_OTHER): Payer: BC Managed Care – PPO | Admitting: Ophthalmology

## 2021-05-22 ENCOUNTER — Encounter (INDEPENDENT_AMBULATORY_CARE_PROVIDER_SITE_OTHER): Payer: Self-pay | Admitting: Ophthalmology

## 2021-05-22 DIAGNOSIS — E113411 Type 2 diabetes mellitus with severe nonproliferative diabetic retinopathy with macular edema, right eye: Secondary | ICD-10-CM

## 2021-05-22 DIAGNOSIS — E113412 Type 2 diabetes mellitus with severe nonproliferative diabetic retinopathy with macular edema, left eye: Secondary | ICD-10-CM

## 2021-05-22 NOTE — Assessment & Plan Note (Signed)
RV 2 weeks for right eye with OCT, focal laser treatment likely inferotemporal to FAZ for center threatening CSME some 4 weeks post most recent injection Avastin ?

## 2021-05-22 NOTE — Assessment & Plan Note (Signed)
OS now 5 weeks post most recent injection into vegF, Avastin to resolve noncentral involved center threatening CSME inferiorly.  Good laser photocoagulation placed in this area today without consequence or complication.  NAVILAS guided laser grid ?

## 2021-05-22 NOTE — Progress Notes (Signed)
? ? ?05/22/2021 ? ?  ? ?CHIEF COMPLAINT ?Patient presents for  ?Chief Complaint  ?Patient presents with  ? Diabetic Retinopathy with Macular Edema  ? ? ? ? ?HISTORY OF PRESENT ILLNESS: ?[name] is a 61 y.o. male who presents to the clinic today for:  ? ?HPI   ?1 week for dilate, OS FOCAL. ?Pt stated no changes in vision. ?Pt denies floaters and FOL. ?Pt wanted to confirm if taking Greggory KeenMOUNJARO is okay to take once a week. ? ?Last edited by Angeline SlimMa, San on 05/22/2021  8:38 AM.  ?  ? ? ?Referring physician: ?Marden NobleGates, Kevonte, MD ?[address] ?[address] ?[place],  [place] [id number] ? ?HISTORICAL INFORMATION:  ? ?Selected notes from the MEDICAL RECORD NUMBER ?  ? ?Lab Results  ?Component Value Date  ? HGBA1C 6.9 (H) 02/22/2021  ?  ? ?CURRENT MEDICATIONS: ?No current outpatient medications on file. (Ophthalmic Drugs)  ? ?No current facility-administered medications for this visit. (Ophthalmic Drugs)  ? ?Current Outpatient Medications (Other)  ?Medication Sig  ? albuterol (VENTOLIN HFA) 108 (90 Base) MCG/ACT inhaler Inhale 2 puffs into the lungs every 6 (six) hours as needed for wheezing or shortness of breath.  ? amLODipine (NORVASC) 5 MG tablet Take 5 mg by mouth daily.  ? aspirin EC 81 MG tablet Take 81 mg by mouth daily.  ? atorvastatin (LIPITOR) 10 MG tablet Take 1 tablet by mouth once daily  ? buPROPion (WELLBUTRIN SR) 150 MG 12 hr tablet Take 1 tablet (150 mg total) by mouth 2 (two) times daily.  ? cetirizine (ZYRTEC) 10 MG tablet Take 10 mg by mouth daily.  ? Cholecalciferol (VITAMIN D) 50 MCG (2000 UT) tablet Take 4,000 Units by mouth daily.   ? cloNIDine (CATAPRES) 0.2 MG tablet Take 0.2 mg by mouth every evening.   ? glimepiride (AMARYL) 2 MG tablet TAKE 1 TABLET BY MOUTH IN THE MORNING AND AT BEDTIME  ? loratadine (CLARITIN) 10 MG tablet Take 10 mg by mouth daily.   ? losartan (COZAAR) 50 MG tablet Take 1 tablet (50 mg total) by mouth daily.  ? Magnesium 400 MG CAPS Take 400 mg by mouth daily.   ? metFORMIN  (GLUCOPHAGE-XR) 500 MG 24 hr tablet Take 1,000 mg by mouth 2 (two) times daily.   ? pantoprazole (PROTONIX) 40 MG tablet TAKE 1 TABLET BY MOUTH TWICE DAILY (Patient taking differently: Take 40 mg by mouth 2 (two) times daily.)  ? Spacer/Aero-Holding Chambers (AEROCHAMBER MV) inhaler Use as instructed (Patient taking differently: as needed. Use as instructed for cough)  ? tirzepatide (MOUNJARO) 12.5 MG/0.5ML Pen Inject 12.5 mg into the skin once a week.  ? vitamin B-12 (CYANOCOBALAMIN) 500 MCG tablet Take 500 mcg by mouth daily.   ? Vitamin D, Ergocalciferol, (DRISDOL) 1.25 MG (50000 UNIT) CAPS capsule 1 po q 2 wks  ? ?No current facility-administered medications for this visit. (Other)  ? ? ? ? ?REVIEW OF SYSTEMS: ?ROS   ?Negative for: Constitutional, Gastrointestinal, Neurological, Skin, Genitourinary, Musculoskeletal, HENT, Endocrine, Cardiovascular, Eyes, Respiratory, Psychiatric, Allergic/Imm, Heme/Lymph ?Last edited by Angeline SlimMa, San on 05/22/2021  8:38 AM.  ?  ? ? ? ?ALLERGIES ?No Known Allergies ? ?PAST MEDICAL HISTORY ?Past Medical History:  ?Diagnosis Date  ? ADD (attention deficit disorder)   ? Anginal pain (HCC) 2012  ? Arthritis, shoulder region   ? B12 deficiency   ? Back pain   ? Coronary artery disease   ? Diabetes (HCC)   ? Diabetic retinopathy (HCC)   ?  GERD (gastroesophageal reflux disease)   ? H/O heart artery stent 04/2010  ? Hyperlipidemia   ? Hypertension   ? Joint pain   ? Knee pain   ? Neuropathy   ? NSTEMI (non-ST elevated myocardial infarction) (HCC)   ? Obesity   ? Osteoarthritis   ? Persistent cough   ? stopped for a year  ? Sleep apnea   ? Swallowing difficulty   ? Swelling of both lower extremities   ? ?Past Surgical History:  ?Procedure Laterality Date  ? ACHILLES TENDON REPAIR Right   ? AMPUTATION TOE Right 03/14/2019  ? Procedure: AMPUTATION TOE, 5th toe;  Surgeon: [name], DPM;  Location: [facility] OR;  Service: Podiatry;  Laterality: Right;  ? heart stent  2012  ? x1  ? INCISION AND  DRAINAGE Right 03/14/2019  ? Procedure: INCISION AND DRAINAGE;  Surgeon: [name], DPM;  Location: [facility] OR;  Service: Podiatry;  Laterality: Right;  ? SHOULDER ARTHROSCOPY Right   ? TEE WITHOUT CARDIOVERSION N/A 03/17/2019  ? Procedure: TRANSESOPHAGEAL ECHOCARDIOGRAM (TEE);  Surgeon: Chilton Si, MD;  Location: [facility] ENDOSCOPY;  Service: Cardiovascular;  Laterality: N/A;  ? TOE SURGERY Right 06/2018  ? for a hammer toe  ? TOTAL SHOULDER ARTHROPLASTY Right 11/19/2019  ? Procedure: TOTAL SHOULDER ARTHROPLASTY;  Surgeon: [name], MD;  Location: [facility] ORS;  Service: Orthopedics;  Laterality: Right;  ? UVULOPALATOPHARYNGOPLASTY    ? ? ?FAMILY HISTORY ?Family History  ?Problem Relation Age of Onset  ? Cancer Other   ? Diabetes Other   ? Heart disease Other   ? Breast cancer Mother   ? Cancer Mother   ? Heart disease Father   ? Diabetes Father   ? Stroke Father   ? Leukemia Brother   ? Asthma Maternal Uncle   ? ? ?SOCIAL HISTORY ?Social History  ? ?Tobacco Use  ? Smoking status: Never  ?  Passive exposure: Yes  ? Smokeless tobacco: Never  ? Tobacco comments:  ?  Both parents growing up.   ?Vaping Use  ? Vaping Use: Never used  ?Substance Use Topics  ? Alcohol use: Yes  ?  Comment: 4-5 on weekends  ? Drug use: No  ? ?  ? ?  ? ?OPHTHALMIC EXAM: ? ?Base Eye Exam   ? ? Visual Acuity (ETDRS)   ? ?   Right Left  ? Dist cc 20/25 -1 20/20  ? ? Correction: Glasses  ? ?  ?  ? ? Tonometry (Tonopen, 8:42 AM)   ? ?   Right Left  ? Pressure 18 18  ? ?  ?  ? ? Pupils   ? ?   Pupils APD  ? Right PERRL None  ? Left PERRL None  ? ?  ?  ? ? Visual Fields   ? ?   Left Right  ?  Full Full  ? ?  ?  ? ? Extraocular Movement   ? ?   Right Left  ?  Full Full  ? ?  ?  ? ? Neuro/Psych   ? ? Oriented x3: Yes  ? Mood/Affect: Normal  ? ?  ?  ? ? Dilation   ? ? Left eye: 2.5% Phenylephrine, 1.0% Mydriacyl @ 8:42 AM  ? ?  ?  ? ?  ? ?Slit Lamp and Fundus Exam   ? ? External Exam   ? ?   Right Left  ? External Normal Normal  ? ?  ?  ? ?  Slit Lamp  Exam   ? ?   Right Left  ? Lids/Lashes Normal Normal  ? Conjunctiva/Sclera White and quiet White and quiet  ? Cornea Clear Clear  ? Anterior Chamber Deep and quiet Deep and quiet  ? Iris Round and reactive Round and reactive  ? Lens 1+ Nuclear sclerosis 1+ Nuclear sclerosis  ? Anterior Vitreous Normal Normal  ? ?  ?  ? ? Fundus Exam   ? ?   Right Left  ? Posterior Vitreous  Normal  ? Disc  Normal  ? C/D Ratio  0.35  ? Macula  Focal laser scars, Microaneurysms, minor thickening inferior to FAZ, not center involved  ? Vessels  NPDR-Severe, with no cotton wool  ? Periphery  Normal  ? ?  ?  ? ?  ? ? ?IMAGING AND PROCEDURES  ?Imaging and Procedures for 05/22/21 ? ?Focal Laser - OS - Left Eye   ? ?   ?Time Out ?Confirmed correct patient, procedure, site, and patient consented.  ? ?Anesthesia ?Topical anesthesia was used. Anesthetic medications included Proparacaine 0.5%.  ? ?Laser Information ?The type of laser was diode. Color was yellow. The duration in seconds was 0.08. The spot size was 100 microns. Laser power was 60. Total spots was 47.  ? ?Post-op ?The patient tolerated the procedure well. The patient received written and verbal post procedure care education.  ? ?Notes ?Focal [place] spacing 0.75 laser spots inferior 6:00 meridian to the FAZ, none closer than 750 ?m from center ? ?  ? ? ?  ?  ? ?  ?ASSESSMENT/PLAN: ? ?Severe nonproliferative diabetic retinopathy of left eye, with macular edema, associated with type 2 diabetes mellitus (HCC) ?OS now 5 weeks post most recent injection into vegF, Avastin to resolve noncentral involved center threatening CSME inferiorly.  Good laser photocoagulation placed in this area today without consequence or complication.  NAVILAS guided laser grid ? ?Severe nonproliferative diabetic retinopathy of right eye, with macular edema, associated with type 2 diabetes mellitus (HCC) ?RV 2 weeks for right eye with OCT, focal laser treatment likely inferotemporal to FAZ for center  threatening CSME some 4 weeks post most recent injection Avastin  ? ?  ICD-10-CM   ?1. Severe nonproliferative diabetic retinopathy of left eye, with macular edema, associated with type 2 diabetes mellitus (HCC)  P29.5188 Foc

## 2021-05-24 ENCOUNTER — Other Ambulatory Visit: Payer: Self-pay | Admitting: Internal Medicine

## 2021-05-25 ENCOUNTER — Encounter (INDEPENDENT_AMBULATORY_CARE_PROVIDER_SITE_OTHER): Payer: Self-pay | Admitting: Family Medicine

## 2021-05-25 ENCOUNTER — Ambulatory Visit (INDEPENDENT_AMBULATORY_CARE_PROVIDER_SITE_OTHER): Payer: BC Managed Care – PPO | Admitting: Family Medicine

## 2021-05-25 VITALS — BP 118/75 | HR 71 | Temp 97.5°F | Ht 71.0 in | Wt 263.0 lb

## 2021-05-25 DIAGNOSIS — Z7985 Long-term (current) use of injectable non-insulin antidiabetic drugs: Secondary | ICD-10-CM

## 2021-05-25 DIAGNOSIS — I152 Hypertension secondary to endocrine disorders: Secondary | ICD-10-CM | POA: Diagnosis not present

## 2021-05-25 DIAGNOSIS — E1165 Type 2 diabetes mellitus with hyperglycemia: Secondary | ICD-10-CM

## 2021-05-25 DIAGNOSIS — E669 Obesity, unspecified: Secondary | ICD-10-CM

## 2021-05-25 DIAGNOSIS — F5089 Other specified eating disorder: Secondary | ICD-10-CM

## 2021-05-25 DIAGNOSIS — E1159 Type 2 diabetes mellitus with other circulatory complications: Secondary | ICD-10-CM

## 2021-05-25 DIAGNOSIS — Z9189 Other specified personal risk factors, not elsewhere classified: Secondary | ICD-10-CM

## 2021-05-25 DIAGNOSIS — Z6836 Body mass index (BMI) 36.0-36.9, adult: Secondary | ICD-10-CM

## 2021-05-25 MED ORDER — TIRZEPATIDE 12.5 MG/0.5ML ~~LOC~~ SOAJ: 12.5000 mg | SUBCUTANEOUS | 0 refills | Status: DC

## 2021-05-25 MED ORDER — BUPROPION HCL ER (SR) 150 MG PO TB12: 150.0000 mg | ORAL_TABLET | Freq: Two times a day (BID) | ORAL | 0 refills | Status: DC

## 2021-05-25 MED ORDER — LOSARTAN POTASSIUM 50 MG PO TABS: 50.0000 mg | ORAL_TABLET | Freq: Every day | ORAL | 0 refills | Status: DC

## 2021-05-29 ENCOUNTER — Encounter (INDEPENDENT_AMBULATORY_CARE_PROVIDER_SITE_OTHER): Payer: BC Managed Care – PPO | Admitting: Ophthalmology

## 2021-06-01 NOTE — Progress Notes (Signed)
?Cardiology Office Note:   ? ?Date:  06/02/2021  ? ?ID:  [name], DOB [birth year], MRN [id number] ? ?PCP:  Josetta Huddle, MD ?  ?[facility] Providers ?Cardiologist:  Werner Lean, MD    ? ?Referring MD: Josetta Huddle, MD  ? ?Chief Complaint: annual follow-up CAD ? ?History of Present Illness:   ? ?[name] is a 61 y.o. male with a hx of CAD with history of inferior STEMI 2012 with stent to RCA 04/29/10, hypertension, diabetes, obesity, hyperlipidemia, mild MR, and OSA on CPAP.  ? ?He established care with Dr. [name] 11/03/2019 for preoperative risk assessment for upcoming shoulder surgery.  Had foot surgery January 2021 without incident.  Had recently lost 35 pounds. Reported symptoms of angina felt like indigestion.  He was deemed low risk for upcoming surgery and no further cardiac testing required.  Advised to follow-up in 1 year sooner if needed. ? ?Today, he is here alone for annual follow-up.  States he has been doing well. Had 1 day of feeling poor while playing golf a few weeks ago.  Had to take more frequent rest breaks.  No further episodes similar to this.  Admits he may have been dehydrated, it was a hot day and he was drinking beer.  Is working out 1 day a week in the gym.  Has difficulty walking for exercise due to neuropathy. He denies chest pain, shortness of breath, lower extremity edema, fatigue, palpitations, melena, hematuria, hemoptysis, diaphoresis, weakness, presyncope, syncope, orthopnea, and PND.  He is compliant on CPAP. ? ?Past Medical History:  ?Diagnosis Date  ? ADD (attention deficit disorder)   ? Anginal pain ([place]) 2012  ? Arthritis, shoulder region   ? B12 deficiency   ? Back pain   ? Coronary artery disease   ? Diabetes ([place])   ? Diabetic retinopathy ([place])   ? GERD (gastroesophageal reflux disease)   ? H/O heart artery stent 04/2010  ? Hyperlipidemia   ? Hypertension   ? Joint pain   ? Knee pain   ? Neuropathy   ? NSTEMI (non-ST elevated myocardial  infarction) ([place])   ? Obesity   ? Osteoarthritis   ? Persistent cough   ? stopped for a year  ? Sleep apnea   ? Swallowing difficulty   ? Swelling of both lower extremities   ? ? ?Past Surgical History:  ?Procedure Laterality Date  ? ACHILLES TENDON REPAIR Right   ? AMPUTATION TOE Right 03/14/2019  ? Procedure: AMPUTATION TOE, 5th toe;  Surgeon: Landis Martins, DPM;  Location: [facility];  Service: Podiatry;  Laterality: Right;  ? heart stent  2012  ? x1  ? INCISION AND DRAINAGE Right 03/14/2019  ? Procedure: INCISION AND DRAINAGE;  Surgeon: Landis Martins, DPM;  Location: [facility];  Service: Podiatry;  Laterality: Right;  ? SHOULDER ARTHROSCOPY Right   ? TEE WITHOUT CARDIOVERSION N/A 03/17/2019  ? Procedure: TRANSESOPHAGEAL ECHOCARDIOGRAM (TEE);  Surgeon: Skeet Latch, MD;  Location: [facility];  Service: Cardiovascular;  Laterality: N/A;  ? TOE SURGERY Right 06/2018  ? for a hammer toe  ? TOTAL SHOULDER ARTHROPLASTY Right 11/19/2019  ? Procedure: TOTAL SHOULDER ARTHROPLASTY;  Surgeon: Tania Ade, MD;  Location: [facility];  Service: Orthopedics;  Laterality: Right;  ? UVULOPALATOPHARYNGOPLASTY    ? ? ?Current Medications: ?Current Meds  ?Medication Sig  ? aspirin EC 81 MG tablet Take 81 mg by mouth daily.  ? buPROPion (WELLBUTRIN SR) 150 MG 12 hr tablet Take 1 tablet (  150 mg total) by mouth 2 (two) times daily.  ? cetirizine (ZYRTEC) 10 MG tablet Take 10 mg by mouth daily.  ? Cholecalciferol (VITAMIN D) 50 MCG (2000 UT) tablet Take 4,000 Units by mouth daily.   ? glimepiride (AMARYL) 2 MG tablet TAKE 1 TABLET BY MOUTH IN THE MORNING AND AT BEDTIME  ? loratadine (CLARITIN) 10 MG tablet Take 10 mg by mouth daily.   ? Magnesium 400 MG CAPS Take 400 mg by mouth daily.   ? metFORMIN (GLUCOPHAGE-XR) 500 MG 24 hr tablet Take 1,000 mg by mouth 2 (two) times daily.   ? pantoprazole (PROTONIX) 40 MG tablet TAKE 1 TABLET BY MOUTH TWICE DAILY (Patient taking differently: Take 40 mg by mouth 2 (two) times daily.)  ?  Spacer/Aero-Holding Chambers (AEROCHAMBER MV) inhaler Use as instructed (Patient taking differently: as needed. Use as instructed for cough)  ? tirzepatide (MOUNJARO) 12.5 MG/0.5ML Pen Inject 12.5 mg into the skin once a week.  ? vitamin B-12 (CYANOCOBALAMIN) 500 MCG tablet Take 500 mcg by mouth daily.   ? Vitamin D, Ergocalciferol, (DRISDOL) 1.25 MG (50000 UNIT) CAPS capsule 1 po q 2 wks  ? [DISCONTINUED] albuterol (VENTOLIN HFA) 108 (90 Base) MCG/ACT inhaler Inhale 2 puffs into the lungs every 6 (six) hours as needed for wheezing or shortness of breath.  ? [DISCONTINUED] amLODipine (NORVASC) 5 MG tablet Take 5 mg by mouth daily.  ? [DISCONTINUED] atorvastatin (LIPITOR) 10 MG tablet Take 1 tablet (10 mg total) by mouth daily. Please make overdue appt with Dr. Gasper Sells before anymore refills. Thank you 3rd and Final Attempt  ? [DISCONTINUED] cloNIDine (CATAPRES) 0.2 MG tablet Take 0.2 mg by mouth every evening. Per pt as needed for high bp  ? [DISCONTINUED] losartan (COZAAR) 50 MG tablet Take 1 tablet (50 mg total) by mouth daily.  ?  ? ?Allergies:   Patient has no known allergies.  ? ?Social History  ? ?Socioeconomic History  ? Marital status: Divorced  ?  Spouse name: Not on file  ? Number of children: Not on file  ? Years of education: Not on file  ? Highest education level: Not on file  ?Occupational History  ? Occupation: unemployed  ?Tobacco Use  ? Smoking status: Never  ?  Passive exposure: Yes  ? Smokeless tobacco: Never  ? Tobacco comments:  ?  Both parents growing up.   ?Vaping Use  ? Vaping Use: Never used  ?Substance and Sexual Activity  ? Alcohol use: Yes  ?  Comment: 4-5 on weekends  ? Drug use: No  ? Sexual activity: Not on file  ?Other Topics Concern  ? Not on file  ?Social History Narrative  ? Originally from [place], [place]. Previously has lived in [place]. 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 No pets currently. No bird or hot tub exposure.  Could have mold in his current home and has lived there for 1.5 years now.   ? ?Social Determinants of Health  ? ?Financial Resource Strain: Not on file  ?Food Insecurity: Not on file  ?Transportation Needs: Not on file  ?Physical Activity: Not on file  ?Stress: Not on file  ?Social Connections: Not on file  ?  ? ?Family History: ?The patient's family history includes Asthma in his maternal uncle; Breast cancer in his mother; Cancer in his mother and another family member; Diabetes in his father and another family member; Heart disease in his father and  another family member; Leukemia in his brother; Stroke in his father. ? ?ROS:   ?Please see the history of present illness.  All other systems reviewed and are negative. ? ?Labs/Other Studies Reviewed:   ? ?The following studies were reviewed today: ? ?Echo TEE 03/17/19 (during during admission for concern of bacteremia) ? ? ?1. Left ventricular ejection fraction, by visual estimation, is 60 to  ?65%. The left ventricle has normal function. Left ventricular septal wall  ?thickness was normal. Normal left ventricular posterior wall thickness.  ?There is no left ventricular  ?hypertrophy.  ? 2. The left ventricle has no regional wall motion abnormalities.  ? 3. Global right ventricle has normal systolic function.The right  ?ventricular size is normal. No increase in right ventricular wall  ?thickness.  ? 4. Left atrial size was normal.  ? 5. Right atrial size was normal.  ? 6. The mitral valve is normal in structure. Mild mitral valve  ?regurgitation. No evidence of mitral stenosis.  ? 7. No trisuspid valve vegetation visualized.  ? 8. The tricuspid valve is normal in structure.  ? 9. The tricuspid valve is normal in structure. Tricuspid valve  ?regurgitation is not demonstrated.  ?10. The aortic valve is normal in structure. Aortic valve regurgitation is  ?not visualized. No evidence of aortic valve sclerosis or stenosis.  ?11. The pulmonic valve was normal in  structure. Pulmonic valve  ?regurgitation is not visualized.  ?12. The inferior vena cava is normal in size with greater than 50%  ?respiratory variability, suggesting right atrial pressure of 3 mmHg.  ? ?Recent Labs: ?10/31/2020

## 2021-06-02 ENCOUNTER — Ambulatory Visit: Payer: BC Managed Care – PPO | Admitting: Nurse Practitioner

## 2021-06-02 ENCOUNTER — Encounter: Payer: Self-pay | Admitting: Nurse Practitioner

## 2021-06-02 VITALS — BP 122/90 | HR 100 | Ht 71.0 in | Wt 270.6 lb

## 2021-06-02 DIAGNOSIS — E785 Hyperlipidemia, unspecified: Secondary | ICD-10-CM | POA: Diagnosis not present

## 2021-06-02 DIAGNOSIS — I340 Nonrheumatic mitral (valve) insufficiency: Secondary | ICD-10-CM

## 2021-06-02 DIAGNOSIS — I152 Hypertension secondary to endocrine disorders: Secondary | ICD-10-CM

## 2021-06-02 DIAGNOSIS — I2510 Atherosclerotic heart disease of native coronary artery without angina pectoris: Secondary | ICD-10-CM

## 2021-06-02 DIAGNOSIS — R000 Tachycardia, unspecified: Secondary | ICD-10-CM

## 2021-06-02 DIAGNOSIS — E1159 Type 2 diabetes mellitus with other circulatory complications: Secondary | ICD-10-CM

## 2021-06-02 DIAGNOSIS — E669 Obesity, unspecified: Secondary | ICD-10-CM

## 2021-06-02 DIAGNOSIS — E1169 Type 2 diabetes mellitus with other specified complication: Secondary | ICD-10-CM

## 2021-06-02 DIAGNOSIS — N189 Chronic kidney disease, unspecified: Secondary | ICD-10-CM

## 2021-06-02 MED ORDER — CLONIDINE HCL 0.2 MG PO TABS: 0.2000 mg | ORAL_TABLET | Freq: Every evening | ORAL | 6 refills | Status: DC

## 2021-06-02 MED ORDER — LOSARTAN POTASSIUM 50 MG PO TABS: 50.0000 mg | ORAL_TABLET | Freq: Every day | ORAL | 3 refills | Status: DC

## 2021-06-02 MED ORDER — ATORVASTATIN CALCIUM 10 MG PO TABS: 10.0000 mg | ORAL_TABLET | Freq: Every day | ORAL | 3 refills | Status: DC

## 2021-06-02 MED ORDER — AMLODIPINE BESYLATE 5 MG PO TABS: 5.0000 mg | ORAL_TABLET | Freq: Every day | ORAL | 3 refills | Status: DC

## 2021-06-02 NOTE — Patient Instructions (Signed)
Medication Instructions:  ? ?Your physician recommends that you continue on your current medications as directed. Please refer to the Current Medication list given to you today. ? ? ?*If you need a refill on your cardiac medications before your next appointment, please call your pharmacy* ? ?Follow-Up: ?At [facility], you and your health needs are our priority.  As part of our continuing mission to provide you with exceptional heart care, we have created designated Provider Care Teams.  These Care Teams include your primary Cardiologist (physician) and Advanced Practice Providers (APPs -  Physician Assistants and Nurse Practitioners) who all work together to provide you with the care you need, when you need it. ? ?We recommend signing up for the patient portal called "MyChart".  Sign up information is provided on this After Visit Summary.  MyChart is used to connect with patients for Virtual Visits (Telemedicine).  Patients are able to view lab/test results, encounter notes, upcoming appointments, etc.  Non-urgent messages can be sent to your provider as well.   ?To learn more about what you can do with MyChart, go to [email or web address].   ? ?Your next appointment:   ?1 year(s) ? ?The format for your next appointment:   ?In Person ? ?Provider:   ?Werner Lean, MD   ? ? ?Other Instructions ?Your physician wants you to follow-up in: 1 year with Dr. Gasper Sells. You will receive a reminder letter in the mail two months in advance. If you don't receive a letter, please call our office to schedule the follow-up appointment. ? ?Please Monitor your Heart Rate and Blood Pressure if you HR is consistently  over 95 X 3 days please call or send mychart message and if your BP is consistently over 130/80 X 3 days please call office or send mychart message.  ? ?Important Information About Sugar ? ? ? ? ?  ?

## 2021-06-05 ENCOUNTER — Encounter (INDEPENDENT_AMBULATORY_CARE_PROVIDER_SITE_OTHER): Payer: Self-pay | Admitting: Ophthalmology

## 2021-06-05 ENCOUNTER — Ambulatory Visit (INDEPENDENT_AMBULATORY_CARE_PROVIDER_SITE_OTHER): Payer: BC Managed Care – PPO | Admitting: Ophthalmology

## 2021-06-05 ENCOUNTER — Encounter (INDEPENDENT_AMBULATORY_CARE_PROVIDER_SITE_OTHER): Payer: BC Managed Care – PPO | Admitting: Ophthalmology

## 2021-06-05 DIAGNOSIS — E113411 Type 2 diabetes mellitus with severe nonproliferative diabetic retinopathy with macular edema, right eye: Secondary | ICD-10-CM | POA: Diagnosis not present

## 2021-06-05 NOTE — Progress Notes (Signed)
? ? ?06/05/2021 ? ?  ? ?CHIEF COMPLAINT ?Patient presents for  ?Chief Complaint  ?Patient presents with  ? Diabetic Retinopathy with Macular Edema  ? ? ? ? ?HISTORY OF PRESENT ILLNESS: ?[name] is a 61 y.o. male who presents to the clinic today for:  ? ?HPI   ?3 weeks focal laser OD, inferotemporal to FAZ ?Patient states vision is stable and unchanged since last visit. Denies any new floaters or FOL. ?LBS: 136 this morning. ?Last edited by Laurin Coder on 06/05/2021  8:44 AM.  ?  ? ? ?Referring physician: ?Josetta Huddle, MD ?[address] ?[address] ?[place],  [place] [address] ? ?HISTORICAL INFORMATION:  ? ?Selected notes from the [facility] ?  ? ?Lab Results  ?Component Value Date  ? HGBA1C 6.9 (H) 02/22/2021  ?  ? ?CURRENT MEDICATIONS: ?No current outpatient medications on file. (Ophthalmic Drugs)  ? ?No current facility-administered medications for this visit. (Ophthalmic Drugs)  ? ?Current Outpatient Medications (Other)  ?Medication Sig  ? amLODipine (NORVASC) 5 MG tablet Take 1 tablet (5 mg total) by mouth daily.  ? aspirin EC 81 MG tablet Take 81 mg by mouth daily.  ? atorvastatin (LIPITOR) 10 MG tablet Take 1 tablet (10 mg total) by mouth daily.  ? buPROPion (WELLBUTRIN SR) 150 MG 12 hr tablet Take 1 tablet (150 mg total) by mouth 2 (two) times daily.  ? cetirizine (ZYRTEC) 10 MG tablet Take 10 mg by mouth daily.  ? Cholecalciferol (VITAMIN D) 50 MCG (2000 UT) tablet Take 4,000 Units by mouth daily.   ? cloNIDine (CATAPRES) 0.2 MG tablet Take 1 tablet (0.2 mg total) by mouth every evening. Per pt as needed for high bp  ? glimepiride (AMARYL) 2 MG tablet TAKE 1 TABLET BY MOUTH IN THE MORNING AND AT BEDTIME  ? loratadine (CLARITIN) 10 MG tablet Take 10 mg by mouth daily.   ? losartan (COZAAR) 50 MG tablet Take 1 tablet (50 mg total) by mouth daily.  ? Magnesium 400 MG CAPS Take 400 mg by mouth daily.   ? metFORMIN (GLUCOPHAGE-XR) 500 MG 24 hr tablet Take 1,000 mg by mouth 2 (two) times daily.    ? pantoprazole (PROTONIX) 40 MG tablet TAKE 1 TABLET BY MOUTH TWICE DAILY (Patient taking differently: Take 40 mg by mouth 2 (two) times daily.)  ? Spacer/Aero-Holding Chambers (AEROCHAMBER MV) inhaler Use as instructed (Patient taking differently: as needed. Use as instructed for cough)  ? tirzepatide (MOUNJARO) 12.5 MG/0.5ML Pen Inject 12.5 mg into the skin once a week.  ? vitamin B-12 (CYANOCOBALAMIN) 500 MCG tablet Take 500 mcg by mouth daily.   ? Vitamin D, Ergocalciferol, (DRISDOL) 1.25 MG (50000 UNIT) CAPS capsule 1 po q 2 wks  ? ?No current facility-administered medications for this visit. (Other)  ? ? ? ? ?REVIEW OF SYSTEMS: ?ROS   ?Negative for: Constitutional, Gastrointestinal, Neurological, Skin, Genitourinary, Musculoskeletal, HENT, Endocrine, Cardiovascular, Eyes, Respiratory, Psychiatric, Allergic/Imm, Heme/Lymph ?Last edited by Hurman Horn, MD on 06/05/2021  9:09 AM.  ?  ? ? ? ?ALLERGIES ?No Known Allergies ? ?PAST MEDICAL HISTORY ?Past Medical History:  ?Diagnosis Date  ? ADD (attention deficit disorder)   ? Anginal pain ([place]) 2012  ? Arthritis, shoulder region   ? B12 deficiency   ? Back pain   ? Coronary artery disease   ? Diabetes ([place])   ? Diabetic retinopathy ([place])   ? GERD (gastroesophageal reflux disease)   ? H/O heart artery stent 04/2010  ?  Hyperlipidemia   ? Hypertension   ? Joint pain   ? Knee pain   ? Neuropathy   ? NSTEMI (non-ST elevated myocardial infarction) ([place])   ? Obesity   ? Osteoarthritis   ? Persistent cough   ? stopped for a year  ? Sleep apnea   ? Swallowing difficulty   ? Swelling of both lower extremities   ? ?Past Surgical History:  ?Procedure Laterality Date  ? ACHILLES TENDON REPAIR Right   ? AMPUTATION TOE Right 03/14/2019  ? Procedure: AMPUTATION TOE, 5th toe;  Surgeon: Landis Martins, DPM;  Location: [facility];  Service: Podiatry;  Laterality: Right;  ? heart stent  2012  ? x1  ? INCISION AND DRAINAGE Right 03/14/2019  ? Procedure: INCISION AND DRAINAGE;  Surgeon:  Landis Martins, DPM;  Location: [facility];  Service: Podiatry;  Laterality: Right;  ? SHOULDER ARTHROSCOPY Right   ? TEE WITHOUT CARDIOVERSION N/A 03/17/2019  ? Procedure: TRANSESOPHAGEAL ECHOCARDIOGRAM (TEE);  Surgeon: Skeet Latch, MD;  Location: [facility];  Service: Cardiovascular;  Laterality: N/A;  ? TOE SURGERY Right 06/2018  ? for a hammer toe  ? TOTAL SHOULDER ARTHROPLASTY Right 11/19/2019  ? Procedure: TOTAL SHOULDER ARTHROPLASTY;  Surgeon: Tania Ade, MD;  [facility];  Service: Orthopedics;  Laterality: Right;  ? UVULOPALATOPHARYNGOPLASTY    ? ? ?FAMILY HISTORY ?Family History  ?Problem Relation Age of Onset  ? Cancer Other   ? Diabetes Other   ? Heart disease Other   ? Breast cancer Mother   ? Cancer Mother   ? Heart disease Father   ? Diabetes Father   ? Stroke Father   ? Leukemia Brother   ? Asthma Maternal Uncle   ? ? ?SOCIAL HISTORY ?Social History  ? ?Tobacco Use  ? Smoking status: Never  ?  Passive exposure: Yes  ? Smokeless tobacco: Never  ? Tobacco comments:  ?  Both parents growing up.   ?Vaping Use  ? Vaping Use: Never used  ?Substance Use Topics  ? Alcohol use: Yes  ?  Comment: 4-5 on weekends  ? Drug use: No  ? ?  ? ?  ? ?OPHTHALMIC EXAM: ? ?Base Eye Exam   ? ? Visual Acuity (ETDRS)   ? ?   Right Left  ? Dist cc 20/25 20/20  ? ? Correction: Glasses  ? ?  ?  ? ? Tonometry (Tonopen, 8:43 AM)   ? ?   Right Left  ? Pressure 15 15  ? ?  ?  ? ? Pupils   ? ?   Pupils Dark Light APD  ? Right PERRL 4 3 None  ? Left PERRL 4 3 None  ? ?  ?  ? ? Extraocular Movement   ? ?   Right Left  ?  Full Full  ? ?  ?  ? ? Neuro/Psych   ? ? Oriented x3: Yes  ? Mood/Affect: Normal  ? ?  ?  ? ? Dilation   ? ? Right eye: 1.0% Mydriacyl, 2.5% Phenylephrine @ 8:43 AM  ? ?  ?  ? ?  ? ?Slit Lamp and Fundus Exam   ? ? External Exam   ? ?   Right Left  ? External Normal Normal  ? ?  ?  ? ? Slit Lamp Exam   ? ?   Right Left  ? Lids/Lashes Normal Normal  ? Conjunctiva/Sclera White and quiet White and quiet  ?  Cornea Clear Clear  ?  Anterior Chamber Deep and quiet Deep and quiet  ? Iris Round and reactive Round and reactive  ? Lens 1+ Nuclear sclerosis 1+ Nuclear sclerosis  ? Anterior Vitreous Normal Normal  ? ?  ?  ? ? Fundus Exam   ? ?   Right Left  ? Posterior Vitreous Normal   ? Disc Normal   ? C/D Ratio 0.35   ? Macula Focal laser scars, Microaneurysms minor retinal thickening inferior improved   ? Vessels NPDR-Severe, with no cotton wool   ? Periphery Normal   ? ?  ?  ? ?  ? ? ?IMAGING AND PROCEDURES  ?Imaging and Procedures for 06/05/21 ? ?Focal Laser - OD - Right Eye   ? ?   ?Time Out ?Confirmed correct patient, procedure, site, and patient consented.  ? ?Anesthesia ?Topical anesthesia was used. Anesthetic medications included Proparacaine 0.5%.  ? ?Laser Information ?The type of laser was diode. Color was yellow. The duration in seconds was 0.1. The spot size was 100 microns. Laser power was 60. Total spots was 74.  ? ?Post-op ?The patient tolerated the procedure well. There were no complications. The patient received written and verbal post procedure care education.  ? ?  ? ? ?  ?  ? ?  ?ASSESSMENT/PLAN: ? ?Severe nonproliferative diabetic retinopathy of right eye, with macular edema, associated with type 2 diabetes mellitus ([name]) ?Focal laser applied today gridlike fashion inferotemporal to FAZ.  No complications  ? ?  ICD-10-CM   ?1. Severe nonproliferative diabetic retinopathy of right eye, with macular edema, associated with type 2 diabetes mellitus ([facility])  [id number] Focal Laser - OD - Right Eye  ?  ? ? ?1.  OS stable post recent focal laser photocoagulation ? ?2.  OD for focal laser treatment inferotemporal to FAZ a completed ? ?3. ? ?Ophthalmic Meds Ordered this visit:  ?No orders of the defined types were placed in this encounter. ? ? ?  ? ?Return in about 4 months (around 10/05/2021) for DILATE OU, OCT, COLOR FP. ? ?There are no Patient Instructions on file for this visit. ? ? ?Explained the diagnoses, plan,  and follow up with the patient and they expressed understanding.  Patient expressed understanding of the importance of proper follow up care.  ? ?Clent Demark. Brooklen Runquist M.D. ?Diseases & Surgery of the Retina and V

## 2021-06-05 NOTE — Assessment & Plan Note (Signed)
Focal laser applied today gridlike fashion inferotemporal to FAZ.  No complications ?

## 2021-06-07 NOTE — Progress Notes (Signed)
? ? ? ?Chief Complaint:  ? ?OBESITY ?[name] is here to discuss his progress with his obesity treatment plan along with follow-up of his obesity related diagnoses. [name] is on the Category 1 Plan and states he is following his eating plan approximately 40% of the time. [name] states he is doing aerobics and weights for 45 minutes 1 time per week. ? ?Today's visit was #: 30 ?Starting weight: 310 lbs ?Starting date: 09/01/2019 ?Today's weight: 263 lbs ?Today's date: 05/25/2021 ?Total lbs lost to date: 18 ?Total lbs lost since last in-office visit: 5 ? ?Interim History: [name] made better decisions with food he ate, and less carbs, mor veggies, and focused on increased protein. Drinking less alcohol than usual as well. ? ?Subjective:  ? ?1. Type 2 diabetes mellitus with hyperglycemia, without long-term current use of insulin ([place]) ?[name] A1c at his PCP's office was 6.5 on 04/12/2021. His blood sugars are much better. Ranging between 76-168 but averaging around 120. Asymptomatic with no issues. [name] is tolerating medication(s) well without side effects.  Medication compliance is good as patient endorses taking it as prescribed.  The patient denies additional concerns regarding this condition.     ? ?2. Hypertension associated with type 2 diabetes mellitus ([place]) ?Cardiovascular ROS: no chest pain or dyspnea on exertion. ? ?BP Readings from Last 3 Encounters:  ?06/02/21 122/90  ?05/25/21 118/75  ?05/03/21 127/81  ? ?3. Other disorder of eating with emotional eating ?[name] is tolerating medication(s) well without side effects.  Medication compliance is good as patient endorses taking it as prescribed.  The patient denies additional concerns regarding this condition. Mood is stable and he is feeling much better.     ? ?4. At risk for heart disease ?[name] is at higher than average risk for cardiovascular disease due to morbid obesity, diabetes, and hypertension. ? ?Assessment/Plan:  ?No orders of the  defined types were placed in this encounter. ? ? ?Medications Discontinued During This Encounter  ?Medication Reason  ? buPROPion (WELLBUTRIN SR) 150 MG 12 hr tablet Reorder  ? losartan (COZAAR) 50 MG tablet Reorder  ? tirzepatide (MOUNJARO) 12.5 MG/0.5ML Pen Reorder  ?  ? ?Meds ordered this encounter  ?Medications  ? buPROPion (WELLBUTRIN SR) 150 MG 12 hr tablet  ?  Sig: Take 1 tablet (150 mg total) by mouth 2 (two) times daily.  ?  Dispense:  60 tablet  ?  Refill:  0  ? tirzepatide (MOUNJARO) 12.5 MG/0.5ML Pen  ?  Sig: Inject 12.5 mg into the skin once a week.  ?  Dispense:  2 mL  ?  Refill:  0  ? DISCONTD: losartan (COZAAR) 50 MG tablet  ?  Sig: Take 1 tablet (50 mg total) by mouth daily.  ?  Dispense:  30 tablet  ?  Refill:  0  ?  ? ?1. Type 2 diabetes mellitus with hyperglycemia, without long-term current use of insulin ([place]) ?We will refill Mounjaro at 12.5 mg for 1 month. [name] will continue his other medications. Continue monitoring his blood sugars and continue his PNP and weight loss.  ? ?- tirzepatide (MOUNJARO) 12.5 MG/0.5ML Pen; Inject 12.5 mg into the skin once a week.  Dispense: 2 mL; Refill: 0 ? ?2. Hypertension associated with type 2 diabetes mellitus ([place]) ?We will refill losartan 50 mg daily #30 for 1 month. [name]'s blood pressure is under great control. He will continue working on healthy weight loss and exercise. We will watch for signs of hypotension  as he continues his lifestyle modifications. ? ?3. Other disorder of eating with emotional eating ?We will refill Wellbutrin BID for 1 month. Behavior modification techniques were discussed today to help [name] deal with his emotional/non-hunger eating behaviors.  Orders and follow up as documented in patient record.  ? ?- buPROPion (WELLBUTRIN SR) 150 MG 12 hr tablet; Take 1 tablet (150 mg total) by mouth 2 (two) times daily.  Dispense: 60 tablet; Refill: 0 ? ?4. At risk for heart disease ?[name] was given approximately 10 minutes of coronary  artery disease prevention counseling today. He is 61 y.o. male and has risk factors for heart disease including obesity. We discussed intensive lifestyle modifications today with an emphasis on specific weight loss instructions and strategies. ? ?Repetitive spaced learning was employed today to elicit superior memory formation and behavioral change.  ? ?5. Obesity,current BMI 36.7 ?[name] is currently in the action stage of change. As such, his goal is to continue with weight loss efforts. He has agreed to the Category 1 Plan.  ? ?Get back to the gym. ? ?Exercise goals: As is.  ? ?Behavioral modification strategies: increasing lean protein intake, decreasing simple carbohydrates, and planning for success. ? ?[name] has agreed to follow-up with our clinic in 3 to 4 weeks. He was informed of the importance of frequent follow-up visits to maximize his success with intensive lifestyle modifications for his multiple health conditions.  ? ?Objective:  ? ?Blood pressure 118/75, pulse 71, temperature (!) 97.5 ?F (36.4 ?C), height 5\' 11"  (1.803 m), weight 263 lb (119.3 kg), SpO2 98 %. ?Body mass index is 36.68 kg/m?. ? ?General: Cooperative, alert, well developed, in no acute distress. ?HEENT: Conjunctivae and lids unremarkable. ?Cardiovascular: Regular rhythm.  ?Lungs: Normal work of breathing. ?Neurologic: No focal deficits.  ? ?Lab Results  ?Component Value Date  ? CREATININE 1.67 (H) 02/22/2021  ? BUN 27 (H) 02/22/2021  ? NA 139 02/22/2021  ? K 4.8 02/22/2021  ? CL 99 02/22/2021  ? CO2 22 02/22/2021  ? ?Lab Results  ?Component Value Date  ? ALT 24 10/31/2020  ? AST 20 10/31/2020  ? ALKPHOS 84 10/31/2020  ? BILITOT 0.9 10/31/2020  ? ?Lab Results  ?Component Value Date  ? HGBA1C 6.9 (H) 02/22/2021  ? HGBA1C 6.4 (H) 10/31/2020  ? HGBA1C 6.0 (H) 07/04/2020  ? HGBA1C 5.6 03/14/2020  ? HGBA1C 5.4 11/17/2019  ? ?Lab Results  ?Component Value Date  ? INSULIN 9.5 10/31/2020  ? INSULIN 28.9 (H) 09/01/2019  ? ?Lab Results   ?Component Value Date  ? TSH 4.170 10/31/2020  ? ?Lab Results  ?Component Value Date  ? CHOL 151 10/31/2020  ? HDL 53 10/31/2020  ? [place] 76 10/31/2020  ? TRIG 122 10/31/2020  ? CHOLHDL 2.9 02/01/2020  ? ?Lab Results  ?Component Value Date  ? VD25OH 83.5 02/22/2021  ? VD25OH 84.9 10/31/2020  ? VD25OH 58.2 07/04/2020  ? ?Lab Results  ?Component Value Date  ? WBC 7.9 10/31/2020  ? HGB 15.2 10/31/2020  ? HCT 44.8 10/31/2020  ? MCV 91 10/31/2020  ? PLT 285 10/31/2020  ? ?No results found for: IRON, TIBC, FERRITIN ? ?Attestation Statements:  ? ?Reviewed by clinician on day of visit: allergies, medications, problem list, medical history, surgical history, family history, social history, and previous encounter notes. ? ? ?I, Trixie Dredge, am acting as transcriptionist for [employer], DO. ? ?I have reviewed the above documentation for accuracy and completeness, and I agree with the above. -  Marjory Sneddon, D.O. ? ?[place] was signed into law in 2016 which includes the topic of electronic health records.  This provides immediate access to information in MyChart.  This includes consultation notes, operative notes, office notes, lab results and pathology reports.  If you have any questions about what you read please let us know at your next visit so we can discuss your concerns and take corrective action if need be.  We are right here with you. ? ? ?

## 2021-06-22 ENCOUNTER — Ambulatory Visit (INDEPENDENT_AMBULATORY_CARE_PROVIDER_SITE_OTHER): Payer: BC Managed Care – PPO | Admitting: Family Medicine

## 2021-06-27 ENCOUNTER — Ambulatory Visit (INDEPENDENT_AMBULATORY_CARE_PROVIDER_SITE_OTHER): Payer: BC Managed Care – PPO | Admitting: Family Medicine

## 2021-06-27 ENCOUNTER — Encounter (INDEPENDENT_AMBULATORY_CARE_PROVIDER_SITE_OTHER): Payer: Self-pay | Admitting: Family Medicine

## 2021-06-27 VITALS — BP 147/84 | HR 102 | Temp 97.7°F | Ht 71.0 in | Wt 261.0 lb

## 2021-06-27 DIAGNOSIS — Z7985 Long-term (current) use of injectable non-insulin antidiabetic drugs: Secondary | ICD-10-CM

## 2021-06-27 DIAGNOSIS — N183 Chronic kidney disease, stage 3 unspecified: Secondary | ICD-10-CM | POA: Diagnosis not present

## 2021-06-27 DIAGNOSIS — I152 Hypertension secondary to endocrine disorders: Secondary | ICD-10-CM

## 2021-06-27 DIAGNOSIS — E1159 Type 2 diabetes mellitus with other circulatory complications: Secondary | ICD-10-CM | POA: Diagnosis not present

## 2021-06-27 DIAGNOSIS — E1169 Type 2 diabetes mellitus with other specified complication: Secondary | ICD-10-CM | POA: Diagnosis not present

## 2021-06-27 DIAGNOSIS — Z9189 Other specified personal risk factors, not elsewhere classified: Secondary | ICD-10-CM

## 2021-06-27 DIAGNOSIS — E559 Vitamin D deficiency, unspecified: Secondary | ICD-10-CM

## 2021-06-27 DIAGNOSIS — E669 Obesity, unspecified: Secondary | ICD-10-CM

## 2021-06-27 DIAGNOSIS — Z6836 Body mass index (BMI) 36.0-36.9, adult: Secondary | ICD-10-CM

## 2021-06-27 MED ORDER — VITAMIN D (ERGOCALCIFEROL) 1.25 MG (50000 UNIT) PO CAPS: ORAL_CAPSULE | ORAL | 0 refills | Status: DC

## 2021-06-27 MED ORDER — TIRZEPATIDE 12.5 MG/0.5ML ~~LOC~~ SOAJ: 12.5000 mg | SUBCUTANEOUS | 0 refills | Status: DC

## 2021-07-03 ENCOUNTER — Other Ambulatory Visit: Payer: Self-pay | Admitting: Nurse Practitioner

## 2021-07-03 MED ORDER — CARVEDILOL 3.125 MG PO TABS: 3.1250 mg | ORAL_TABLET | Freq: Two times a day (BID) | ORAL | 11 refills | Status: DC

## 2021-07-09 NOTE — Progress Notes (Unsigned)
Chief Complaint:   OBESITY [name] is here to discuss his progress with his obesity treatment plan along with follow-up of his obesity related diagnoses. [name] is on the Category 1 Plan and states he is following his eating plan approximately 50% of the time. [name] states he is going to the gym for 45-50 minutes minutes 2 times per week.  Today's visit was #: 3 Starting weight: 310 lbs Starting date: 09/01/2019 Today's weight: 261 lbs Today's date: 06/27/2021 Total lbs lost to date: 49 lbs Total lbs lost since last in-office visit: 2 lbs  Interim History: [name] started going to the gym as per his goal and last office visit. Since taking Mounjaro, he has better cravings and hunger control during the week, but weekends are still a challenge to avoid eating off plan.  Subjective:   1. Hypertension associated with type 2 diabetes mellitus ([place]) [name] is not currently taking his Clonidine because it makes hi, feel tired. Medications is managed per his Cardiologist and PCP. He reports that blood pressure readings at home are: 140-150-160's/90's and with a pulse reate in the upper 90's + low 100's.  2. Stage 3 chronic kidney disease ASSOC WITH DIABETES ([place]) [name]'s last serum was 1.67 and GFR of 47 as of 3-4 months ago.  3. Vitamin D deficiency He is currently taking prescription vitamin D 50,000 IU each week.   4. Type 2 diabetes mellitus with other specified complication, without long-term current use of insulin ([facility]) Medications reviewed.   5. At risk for medication nonadherence [name] is at a higher than average risk for medication non-adherence due to not taking Clonidine (blood pressure medication).  Assessment/Plan:  No orders of the defined types were placed in this encounter.   Medications Discontinued During This Encounter  Medication Reason   Vitamin D, Ergocalciferol, (DRISDOL) 1.25 MG (50000 UNIT) CAPS capsule Reorder   tirzepatide (MOUNJARO) 12.5 MG/0.5ML Pen  Reorder     Meds ordered this encounter  Medications   tirzepatide (MOUNJARO) 12.5 MG/0.5ML Pen    Sig: Inject 12.5 mg into the skin once a week. Every thursday    Dispense:  2 mL    Refill:  0   Vitamin D, Ergocalciferol, (DRISDOL) 1.25 MG (50000 UNIT) CAPS capsule    Sig: 1 po q 2 wks    Dispense:  6 capsule    Refill:  0    90 d supply     1. Hypertension associated with type 2 diabetes mellitus ([place]) Basel was told by cardiology to contact the for medications adjustment if readings not at 130/80 or less and if pulse remains high.  Larance will send a [employer].  2. Stage 3 chronic kidney disease ASSOC WITH DIABETES ([place]) Umair was told by his Cardiologist, and I recommend as well, that he make a follow up appointment with his PCP re: renal function - as certain medications may need to be modified or changed.  3. Vitamin D deficiency We will refill Vitamin D.  - Vitamin D, Ergocalciferol, (DRISDOL) 1.25 MG (50000 UNIT) CAPS capsule; 1 po q 2 wks  Dispense: 6 capsule; Refill: 0  4. Type 2 diabetes mellitus with other specified complication, without long-term current use of insulin ([place]) Albert will obtain Glimepiride and other diabetic medications from his PCP. We will refill Mounjaro with no increase in dose.  - tirzepatide (MOUNJARO) 12.5 MG/0.5ML Pen; Inject 12.5 mg into the skin once a week. Every thursday  Dispense: 2 mL; Refill: 0  5. At risk for medication nonadherence [name] was given approximately 9 minutes of counseling today to help him avoid medication non-adherence.  We discussed importance of taking medications at a similar time each day and the use of daily pill organizers to help improve medication adherence.  Repetitive spaced learning was employed today to elicit superior memory formation and behavioral change.   6. Obesity, Current BMI 36.4 [name] is currently in the action stage of change. As such, his goal is to continue with weight loss efforts.  He has agreed to the Category 1 Plan.   [name] will increase exercise as tolerated.  Exercise goals: 150 minutes of exercise weekly.//  Behavioral modification strategies: increasing water intake, decreasing sodium intake, meal planning and cooking strategies, and planning for success.  [name] has agreed to follow-up with our clinic in 3 weeks. He was informed of the importance of frequent follow-up visits to maximize his success with intensive lifestyle modifications for his multiple health conditions.   Objective:   Blood pressure (!) 147/84, pulse (!) 102, temperature 97.7 F (36.5 C), height 5\' 11"  (1.803 m), weight 261 lb (118.4 kg), SpO2 99 %. Body mass index is 36.4 kg/m.  General: Cooperative, alert, well developed, in no acute distress. HEENT: Conjunctivae and lids unremarkable. Cardiovascular: Regular rhythm.  Lungs: Normal work of breathing. Neurologic: No focal deficits.   Lab Results  Component Value Date   CREATININE 1.67 (H) 02/22/2021   BUN 27 (H) 02/22/2021   NA 139 02/22/2021   K 4.8 02/22/2021   CL 99 02/22/2021   CO2 22 02/22/2021   Lab Results  Component Value Date   ALT 24 10/31/2020   AST 20 10/31/2020   ALKPHOS 84 10/31/2020   BILITOT 0.9 10/31/2020   Lab Results  Component Value Date   HGBA1C 6.9 (H) 02/22/2021   HGBA1C 6.4 (H) 10/31/2020   HGBA1C 6.0 (H) 07/04/2020   HGBA1C 5.6 03/14/2020   HGBA1C 5.4 11/17/2019   Lab Results  Component Value Date   INSULIN 9.5 10/31/2020   INSULIN 28.9 (H) 09/01/2019   Lab Results  Component Value Date   TSH 4.170 10/31/2020   Lab Results  Component Value Date   CHOL 151 10/31/2020   HDL 53 10/31/2020   LDLCALC 76 10/31/2020   TRIG 122 10/31/2020   CHOLHDL 2.9 02/01/2020   Lab Results  Component Value Date   VD25OH 83.5 02/22/2021   VD25OH 84.9 10/31/2020   VD25OH 58.2 07/04/2020   Lab Results  Component Value Date   WBC 7.9 10/31/2020   HGB 15.2 10/31/2020   HCT 44.8 10/31/2020    MCV 91 10/31/2020   PLT 285 10/31/2020   No results found for: IRON, TIBC, FERRITIN  Attestation Statements:   Reviewed by clinician on day of visit: allergies, medications, problem list, medical history, surgical history, family history, social history, and previous encounter notes.  ILennette Bihari, CMA, am acting as transcriptionist for Dr. Raliegh Scarlet, DO.  I have reviewed the above documentation for accuracy and completeness, and I agree with the above. Marjory Sneddon, D.O.  The [place] was signed into law in 2016 which includes the topic of electronic health records.  This provides immediate access to information in MyChart.  This includes consultation notes, operative notes, office notes, lab results and pathology reports.  If you have any questions about what you read please let us know at your next visit so we can discuss your concerns and take corrective  action if need be.  We are right here with you.

## 2021-07-18 ENCOUNTER — Encounter (INDEPENDENT_AMBULATORY_CARE_PROVIDER_SITE_OTHER): Payer: Self-pay | Admitting: Family Medicine

## 2021-07-18 ENCOUNTER — Ambulatory Visit (INDEPENDENT_AMBULATORY_CARE_PROVIDER_SITE_OTHER): Payer: BC Managed Care – PPO | Admitting: Family Medicine

## 2021-07-18 ENCOUNTER — Telehealth (INDEPENDENT_AMBULATORY_CARE_PROVIDER_SITE_OTHER): Payer: Self-pay | Admitting: Family Medicine

## 2021-07-18 ENCOUNTER — Other Ambulatory Visit (HOSPITAL_COMMUNITY): Payer: Self-pay

## 2021-07-18 VITALS — BP 122/80 | HR 88 | Temp 98.0°F | Ht 71.0 in | Wt 260.0 lb

## 2021-07-18 DIAGNOSIS — I152 Hypertension secondary to endocrine disorders: Secondary | ICD-10-CM

## 2021-07-18 DIAGNOSIS — E1159 Type 2 diabetes mellitus with other circulatory complications: Secondary | ICD-10-CM

## 2021-07-18 DIAGNOSIS — E669 Obesity, unspecified: Secondary | ICD-10-CM

## 2021-07-18 DIAGNOSIS — Z9189 Other specified personal risk factors, not elsewhere classified: Secondary | ICD-10-CM

## 2021-07-18 DIAGNOSIS — F5089 Other specified eating disorder: Secondary | ICD-10-CM

## 2021-07-18 DIAGNOSIS — E1169 Type 2 diabetes mellitus with other specified complication: Secondary | ICD-10-CM | POA: Diagnosis not present

## 2021-07-18 DIAGNOSIS — Z7985 Long-term (current) use of injectable non-insulin antidiabetic drugs: Secondary | ICD-10-CM

## 2021-07-18 DIAGNOSIS — Z6836 Body mass index (BMI) 36.0-36.9, adult: Secondary | ICD-10-CM

## 2021-07-18 MED ORDER — TIRZEPATIDE 12.5 MG/0.5ML ~~LOC~~ SOAJ
12.5000 mg | SUBCUTANEOUS | 0 refills | Status: DC
  Filled 2021-07-18 – 2021-07-19 (×2): qty 2, 28d supply, fill #0

## 2021-07-18 MED ORDER — BUPROPION HCL ER (SR) 150 MG PO TB12: 150.0000 mg | ORAL_TABLET | Freq: Two times a day (BID) | ORAL | 0 refills | Status: DC

## 2021-07-18 NOTE — Telephone Encounter (Signed)
Dr.Opalski ?

## 2021-07-18 NOTE — Telephone Encounter (Signed)
Pt attended appt and states pharmacy does not have medication dosage in stock. Pt asking if Dr can increase dosage so pt can get the medication. The best pharmacy is [facility] [address], [place] - [address]. Pt states [facility] has 1 box of medication in stock and [facility] has "boxes" per Pensions consultant. Pt does not care which it is sent to as long as he can get the medication and is called so he knows which on to go to. The best call back number [phone].   Pts appt 6/6 with Dr. Val Eagle

## 2021-07-19 ENCOUNTER — Encounter (INDEPENDENT_AMBULATORY_CARE_PROVIDER_SITE_OTHER): Payer: Self-pay | Admitting: Family Medicine

## 2021-07-19 ENCOUNTER — Other Ambulatory Visit (HOSPITAL_COMMUNITY): Payer: Self-pay

## 2021-07-19 ENCOUNTER — Other Ambulatory Visit (INDEPENDENT_AMBULATORY_CARE_PROVIDER_SITE_OTHER): Payer: Self-pay

## 2021-07-19 DIAGNOSIS — E1169 Type 2 diabetes mellitus with other specified complication: Secondary | ICD-10-CM

## 2021-07-19 MED ORDER — TIRZEPATIDE 15 MG/0.5ML ~~LOC~~ SOAJ: 15.0000 mg | SUBCUTANEOUS | 0 refills | Status: DC

## 2021-07-25 NOTE — Telephone Encounter (Signed)
Called pt to let him know it will be discussed at his next OV with Dr. Val Eagle

## 2021-07-26 NOTE — Progress Notes (Signed)
Chief Complaint:   OBESITY [name] is here to discuss his progress with his obesity treatment plan along with follow-up of his obesity related diagnoses. [name] is on the Category 1 Plan and states he is following his eating plan approximately 50% of the time. [name] states he is doing cardio and lifting weights 60 minutes 1 times per week.  Today's visit was #: 72 Starting weight: 310 lbs Starting date: 09/01/2019 Today's weight: 260 lbs Today's date: 07/18/2021 Total lbs lost to date: 50 lbs Total lbs lost since last in-office visit: 1 lb  Interim History: [name] voices that on the weekend he eats out, he is social with friends, eats off plan and drinks alcohol.  He has no issues with the meal plan, he likes the food and it is easy to follow.   Subjective:   1. Hypertension associated with type 2 diabetes mellitus ([place]) [name]'s blood pressures at home are running between 150's-160's over 90's.  Recently seen by his specialists and they added Coreg.  He is taking clonidine on a regular basis which helps.  2. Type 2 diabetes mellitus with other specified complication, without long-term current use of insulin ([place]) [name] ran out of [place] and is due for his injection in 2 days.  The pharmacy is unable to refill due to the shortage.  His fasting blood sugars, highest is 138, lowest is 67.  (Normally 100-110).   3. Other disorder of eating with emotional eating [name]'s mood is stable, he is taking his medication twice daily.   4. At risk for heart disease [name] is at higher than average risk for cardiovascular disease due to obesity, hypertension,diabetes mellitus, and hyperlipidemia.  Assessment/Plan:  No orders of the defined types were placed in this encounter.   Medications Discontinued During This Encounter  Medication Reason   buPROPion (WELLBUTRIN SR) 150 MG 12 hr tablet Reorder   tirzepatide (MOUNJARO) 12.5 MG/0.5ML Pen Reorder     Meds ordered this encounter   Medications   buPROPion (WELLBUTRIN SR) 150 MG 12 hr tablet    Sig: Take 1 tablet (150 mg total) by mouth 2 (two) times daily.    Dispense:  60 tablet    Refill:  0   DISCONTD: tirzepatide (MOUNJARO) 12.5 MG/0.5ML Pen    Sig: Inject 12.5 mg into the skin once a week. (Every Thursday)    Dispense:  2 mL    Refill:  0     1. Hypertension associated with type 2 diabetes mellitus ([place]) [name]'s blood pressure was at goal today.  Continue current medication regimen per specialist.  [name] was strongly encouraged to get back on meal plan, decrease alcohol and salt intake.  Continue weight loss and prudent nutritional plan and increase exercise.   2. Type 2 diabetes mellitus with other specified complication, without long-term current use of insulin ([place]) Refill [name], [name] is requesting it be sent to a different pharmacy.  Continue other diabetic medication regimen per PCP.  Continue prudent nutritional plan and weight loss.   3. Other disorder of eating with emotional eating Refill Wellbutrin 150 mg BID.  Start exercising back at the gym and be mindful of eating.  Continue to follow prudent nutritional plan.  - buPROPion (WELLBUTRIN SR) 150 MG 12 hr tablet; Take 1 tablet (150 mg total) by mouth 2 (two) times daily.  Dispense: 60 tablet; Refill: 0  4. At risk for heart disease Due to [place] current state of health and medical condition(s), he  is at a higher risk for heart disease.  This puts the patient at much greater risk to subsequently develop cardiopulmonary conditions that can significantly affect patient's quality of life in a negative manner as well.    At least 9 minutes were spent on counseling [name] about these concerns today, and we discussed the importance of reversing risks factors of obesity, especially truncal and visceral fat, hypertension, hyperlipidemia, and pre-diabetes.  The initial goal is to lose at least 5-10% of starting weight to help reduce  these risk factors.  Counseling: Intensive lifestyle modifications were discussed with [name] as the most appropriate first line of treatment.  he will continue to work on diet, exercise, and weight loss efforts.  We will continue to reassess these conditions on a fairly regular basis in an attempt to decrease the patient's overall morbidity and mortality.  Evidence-based interventions for health behavior change were utilized today including the discussion of self monitoring techniques, problem-solving barriers, and SMART goal setting techniques.  Specifically, regarding patient's less desirable eating habits and patterns, we employed the technique of small changes when [name] has not been able to fully commit to his prudent nutritional plan.    5. Obesity, Current BMI 36.3 [name] has agreed to get back to the gym 2-3 days per week.   [name] is currently in the action stage of change. As such, his goal is to continue with weight loss efforts. He has agreed to the Category 1 Plan.   Exercise goals: For substantial health benefits, adults should do at least 150 minutes (2 hours and 30 minutes) a week of moderate-intensity, or 75 minutes (1 hour and 15 minutes) a week of vigorous-intensity aerobic physical activity, or an equivalent combination of moderate- and vigorous-intensity aerobic activity. Aerobic activity should be performed in episodes of at least 10 minutes, and preferably, it should be spread throughout the week.  Behavioral modification strategies: celebration eating strategies and planning for success.  [name] has agreed to follow-up with our clinic in 3 weeks. He was informed of the importance of frequent follow-up visits to maximize his success with intensive lifestyle modifications for his multiple health conditions.    Objective:   Blood pressure 122/80, pulse 88, temperature 98 F (36.7 C), height 5\' 11"  (1.803 m), weight 260 lb (117.9 kg), SpO2 100 %. Body mass  index is 36.26 kg/m.  General: Cooperative, alert, well developed, in no acute distress. HEENT: Conjunctivae and lids unremarkable. Cardiovascular: Regular rhythm.  Lungs: Normal work of breathing. Neurologic: No focal deficits.   Lab Results  Component Value Date   CREATININE 1.67 (H) 02/22/2021   BUN 27 (H) 02/22/2021   NA 139 02/22/2021   K 4.8 02/22/2021   CL 99 02/22/2021   CO2 22 02/22/2021   Lab Results  Component Value Date   ALT 24 10/31/2020   AST 20 10/31/2020   ALKPHOS 84 10/31/2020   BILITOT 0.9 10/31/2020   Lab Results  Component Value Date   HGBA1C 6.9 (H) 02/22/2021   HGBA1C 6.4 (H) 10/31/2020   HGBA1C 6.0 (H) 07/04/2020   HGBA1C 5.6 03/14/2020   HGBA1C 5.4 11/17/2019   Lab Results  Component Value Date   INSULIN 9.5 10/31/2020   INSULIN 28.9 (H) 09/01/2019   Lab Results  Component Value Date   TSH 4.170 10/31/2020   Lab Results  Component Value Date   CHOL 151 10/31/2020   HDL 53 10/31/2020   LDLCALC 76 10/31/2020   TRIG  122 10/31/2020   CHOLHDL 2.9 02/01/2020   Lab Results  Component Value Date   VD25OH 83.5 02/22/2021   VD25OH 84.9 10/31/2020   VD25OH 58.2 07/04/2020   Lab Results  Component Value Date   WBC 7.9 10/31/2020   HGB 15.2 10/31/2020   HCT 44.8 10/31/2020   MCV 91 10/31/2020   PLT 285 10/31/2020   No results found for: "IRON", "TIBC", "FERRITIN"   Attestation Statements:   Reviewed by clinician on day of visit: allergies, medications, problem list, medical history, surgical history, family history, social history, and previous encounter notes.  I, Davy Pique, RMA, am acting as Location manager for [employer], DO.  I have reviewed the above documentation for accuracy and completeness, and I agree with the above. Marjory Sneddon, D.O.  The [place] was signed into law in 2016 which includes the topic of electronic health records.  This provides immediate access to information in MyChart.   This includes consultation notes, operative notes, office notes, lab results and pathology reports.  If you have any questions about what you read please let us know at your next visit so we can discuss your concerns and take corrective action if need be.  We are right here with you.

## 2021-08-10 ENCOUNTER — Encounter (INDEPENDENT_AMBULATORY_CARE_PROVIDER_SITE_OTHER): Payer: Self-pay | Admitting: Family Medicine

## 2021-08-10 ENCOUNTER — Ambulatory Visit (INDEPENDENT_AMBULATORY_CARE_PROVIDER_SITE_OTHER): Payer: BC Managed Care – PPO | Admitting: Family Medicine

## 2021-08-10 ENCOUNTER — Other Ambulatory Visit (INDEPENDENT_AMBULATORY_CARE_PROVIDER_SITE_OTHER): Payer: Self-pay | Admitting: Family Medicine

## 2021-08-10 VITALS — BP 116/76 | HR 84 | Temp 97.8°F | Ht 71.0 in | Wt 258.0 lb

## 2021-08-10 DIAGNOSIS — Z6836 Body mass index (BMI) 36.0-36.9, adult: Secondary | ICD-10-CM

## 2021-08-10 DIAGNOSIS — Z9189 Other specified personal risk factors, not elsewhere classified: Secondary | ICD-10-CM

## 2021-08-10 DIAGNOSIS — Z7985 Long-term (current) use of injectable non-insulin antidiabetic drugs: Secondary | ICD-10-CM

## 2021-08-10 DIAGNOSIS — F5089 Other specified eating disorder: Secondary | ICD-10-CM

## 2021-08-10 DIAGNOSIS — E1159 Type 2 diabetes mellitus with other circulatory complications: Secondary | ICD-10-CM | POA: Diagnosis not present

## 2021-08-10 DIAGNOSIS — E669 Obesity, unspecified: Secondary | ICD-10-CM | POA: Diagnosis not present

## 2021-08-10 MED ORDER — TIRZEPATIDE 15 MG/0.5ML ~~LOC~~ SOAJ: 15.0000 mg | SUBCUTANEOUS | 0 refills | Status: DC

## 2021-08-10 NOTE — Progress Notes (Signed)
Chief Complaint:   OBESITY Drae is here to discuss his progress with his obesity treatment plan along with follow-up of his obesity related diagnoses. Cort is on the Category 1 Plan and states he is following his eating plan approximately 60% of the time. Masaji states he is doing cardio and strength training 60 minutes 3 times per week.  Today's visit was #: 33 Starting weight: 310 lbs Starting date: 09/01/2019 Today's weight: 258 lbs Today's date: 08/10/2021 Total lbs lost to date: 52 lbs Total lbs lost since last in-office visit: 2 lbs  Interim History: [place] splurged on [employer], in [place].  Greggory Keen is helping.  Subjective:   1. Type 2 diabetes mellitus with other circulatory complication, without long-term current use of insulin (HCC) Medications reviewed. Diabetic ROS: no polyuria or polydipsia, no chest pain, dyspnea or TIA's, no numbness, tingling or pain in extremitiesMounjaro was increased at last office visit due to shortage/supply issues with the 12.5 mg.  Molly Maduro tolerated it well and "loves it".  2. At risk for malnutrition Vernal is at increased risk for malnutrition.  Assessment/Plan:  No orders of the defined types were placed in this encounter.   Medications Discontinued During This Encounter  Medication Reason   tirzepatide [facility]) 15 MG/0.5ML Pen Reorder     Meds ordered this encounter  Medications   DISCONTD: tirzepatide (MOUNJARO) 15 MG/0.5ML Pen    Sig: Inject 15 mg into the skin once a week.    Dispense:  2 mL    Refill:  0     1. Type 2 diabetes mellitus with other circulatory complication, without long-term current use of insulin (HCC) Good blood sugar control is important to decrease the likelihood of diabetic complications such as nephropathy, neuropathy, limb loss, blindness, coronary artery disease, and death. Intensive lifestyle modification including diet, exercise and weight loss are the first line of treatment for diabetes.    Refill- tirzepatide (MOUNJARO) 15 MG/0.5ML Pen; Inject 15 mg into the skin once a week.  Dispense: 2 mL; Refill: 0  2. At risk for malnutrition Denzal was given approximately 9 minutes of counseling today regarding prevention of malnutrition and ways to meet macronutrient goals..    3. Obesity, Current BMI 36.1 Jasmine is currently in the action stage of change. As such, his goal is to continue with weight loss efforts. He has agreed to the Category 2 Plan with lunch options.  Exercise goals:  As is.  Behavioral modification strategies: avoiding temptations and planning for success.  Jguadalupe has agreed to follow-up with our clinic in 3 weeks. He was informed of the importance of frequent follow-up visits to maximize his success with intensive lifestyle modifications for his multiple health conditions.   Objective:   Blood pressure 116/76, pulse 84, temperature 97.8 F (36.6 C), height 5\' 11"  (1.803 m), weight 258 lb (117 kg), SpO2 99 %. Body mass index is 35.98 kg/m.  General: Cooperative, alert, well developed, in no acute distress. HEENT: Conjunctivae and lids unremarkable. Cardiovascular: Regular rhythm.  Lungs: Normal work of breathing. Neurologic: No focal deficits.   Lab Results  Component Value Date   CREATININE 1.67 (H) 02/22/2021   BUN 27 (H) 02/22/2021   NA 139 02/22/2021   K 4.8 02/22/2021   CL 99 02/22/2021   CO2 22 02/22/2021   Lab Results  Component Value Date   ALT 24 10/31/2020   AST 20 10/31/2020   ALKPHOS 84 10/31/2020   BILITOT 0.9 10/31/2020   Lab Results  Component Value Date   HGBA1C 6.9 (H) 02/22/2021   HGBA1C 6.4 (H) 10/31/2020   HGBA1C 6.0 (H) 07/04/2020   HGBA1C 5.6 03/14/2020   HGBA1C 5.4 11/17/2019   Lab Results  Component Value Date   INSULIN 9.5 10/31/2020   INSULIN 28.9 (H) 09/01/2019   Lab Results  Component Value Date   TSH 4.170 10/31/2020   Lab Results  Component Value Date   CHOL 151 10/31/2020   HDL 53 10/31/2020    LDLCALC 76 10/31/2020   TRIG 122 10/31/2020   CHOLHDL 2.9 02/01/2020   Lab Results  Component Value Date   VD25OH 83.5 02/22/2021   VD25OH 84.9 10/31/2020   VD25OH 58.2 07/04/2020   Lab Results  Component Value Date   WBC 7.9 10/31/2020   HGB 15.2 10/31/2020   HCT 44.8 10/31/2020   MCV 91 10/31/2020   PLT 285 10/31/2020   No results found for: "IRON", "TIBC", "FERRITIN"  Attestation Statements:   Reviewed by clinician on day of visit: allergies, medications, problem list, medical history, surgical history, family history, social history, and previous encounter notes.  I, Malcolm Metro, RMA, am acting as Energy manager for [employer], DO.   I have reviewed the above documentation for accuracy and completeness, and I agree with the above. Carlye Grippe, D.O.  The 21st Century Cures Act was signed into law in 2016 which includes the topic of electronic health records.  This provides immediate access to information in MyChart.  This includes consultation notes, operative notes, office notes, lab results and pathology reports.  If you have any questions about what you read please let us know at your next visit so we can discuss your concerns and take corrective action if need be.  We are right here with you.

## 2021-08-14 ENCOUNTER — Other Ambulatory Visit (INDEPENDENT_AMBULATORY_CARE_PROVIDER_SITE_OTHER): Payer: Self-pay | Admitting: Bariatrics

## 2021-08-14 ENCOUNTER — Encounter (INDEPENDENT_AMBULATORY_CARE_PROVIDER_SITE_OTHER): Payer: Self-pay | Admitting: Family Medicine

## 2021-08-14 MED ORDER — TIRZEPATIDE 12.5 MG/0.5ML ~~LOC~~ SOAJ: 12.5000 mg | SUBCUTANEOUS | 0 refills | Status: DC

## 2021-08-14 NOTE — Telephone Encounter (Signed)
Dr. Manson Passey, pt is a diabetic, please review

## 2021-09-04 DIAGNOSIS — G4733 Obstructive sleep apnea (adult) (pediatric): Secondary | ICD-10-CM | POA: Diagnosis not present

## 2021-09-05 DIAGNOSIS — N1831 Chronic kidney disease, stage 3a: Secondary | ICD-10-CM | POA: Diagnosis not present

## 2021-09-05 DIAGNOSIS — I10 Essential (primary) hypertension: Secondary | ICD-10-CM | POA: Diagnosis not present

## 2021-09-05 DIAGNOSIS — E1121 Type 2 diabetes mellitus with diabetic nephropathy: Secondary | ICD-10-CM | POA: Diagnosis not present

## 2021-09-07 ENCOUNTER — Ambulatory Visit (INDEPENDENT_AMBULATORY_CARE_PROVIDER_SITE_OTHER): Payer: BC Managed Care – PPO | Admitting: Family Medicine

## 2021-09-07 ENCOUNTER — Encounter (INDEPENDENT_AMBULATORY_CARE_PROVIDER_SITE_OTHER): Payer: Self-pay | Admitting: Family Medicine

## 2021-09-07 VITALS — BP 130/80 | HR 80 | Temp 98.0°F | Ht 71.0 in | Wt 260.0 lb

## 2021-09-07 DIAGNOSIS — E1169 Type 2 diabetes mellitus with other specified complication: Secondary | ICD-10-CM | POA: Diagnosis not present

## 2021-09-07 DIAGNOSIS — E669 Obesity, unspecified: Secondary | ICD-10-CM

## 2021-09-07 DIAGNOSIS — F5089 Other specified eating disorder: Secondary | ICD-10-CM | POA: Diagnosis not present

## 2021-09-07 DIAGNOSIS — Z9189 Other specified personal risk factors, not elsewhere classified: Secondary | ICD-10-CM

## 2021-09-07 DIAGNOSIS — Z6836 Body mass index (BMI) 36.0-36.9, adult: Secondary | ICD-10-CM

## 2021-09-07 DIAGNOSIS — Z7985 Long-term (current) use of injectable non-insulin antidiabetic drugs: Secondary | ICD-10-CM

## 2021-09-07 MED ORDER — TIRZEPATIDE 12.5 MG/0.5ML ~~LOC~~ SOAJ: 12.5000 mg | SUBCUTANEOUS | 0 refills | Status: DC

## 2021-09-07 MED ORDER — BUPROPION HCL ER (SR) 150 MG PO TB12: 150.0000 mg | ORAL_TABLET | Freq: Two times a day (BID) | ORAL | 0 refills | Status: DC

## 2021-09-12 NOTE — Progress Notes (Signed)
Chief Complaint:   OBESITY [name] is here to discuss his progress with his obesity treatment plan along with follow-up of his obesity related diagnoses. [name] is on the Category 2 Plan with lunch options and states he is following his eating plan approximately 25% of the time. [name] states he is not currently exercising.  Today's visit was #: 34 Starting weight: 310 lbs Starting date: 09/01/2019 Today's weight: 260 lbs Today's date: 09/07/2021 Total lbs lost to date: 50 Total lbs lost since last in-office visit: +2  Interim History: [name] reports over the last few weeks, he just ate what he wanted to. Now, he says he has to get his head back in the game.  Subjective:   1. Type 2 diabetes mellitus with other specified complication, without long-term current use of insulin (HCC) Pt's fasting blood sugar runs in the 140's and has not been under great control lately, per pt.  2. Other disorder of eating with emotional eating Controlled. Medication: Wellbutrin.  3. At risk for hypoglycemia [name] is at increased risk for hypoglycemia due to changes in diet, diagnosis of diabetes, and/or insulin use.   Assessment/Plan:   Medications Discontinued During This Encounter  Medication Reason   buPROPion (WELLBUTRIN SR) 150 MG 12 hr tablet Reorder   tirzepatide (MOUNJARO) 12.5 MG/0.5ML Pen Reorder     Meds ordered this encounter  Medications   tirzepatide (MOUNJARO) 12.5 MG/0.5ML Pen    Sig: Inject 12.5 mg into the skin once a week.    Dispense:  2 mL    Refill:  0   buPROPion (WELLBUTRIN SR) 150 MG 12 hr tablet    Sig: Take 1 tablet (150 mg total) by mouth 2 (two) times daily.    Dispense:  60 tablet    Refill:  0     1. Type 2 diabetes mellitus with other specified complication, without long-term current use of insulin (HCC) Good blood sugar control is important to decrease the likelihood of diabetic complications such as nephropathy, neuropathy, limb loss, blindness,  coronary artery disease, and death. Intensive lifestyle modification including diet, exercise and weight loss are the first line of treatment for diabetes. Refill Mounjaro 12.5 mg. [facility] pharmacy, and they have 12.5 mg in stock, not 15.0 mg.  Refill- tirzepatide (MOUNJARO) 12.5 MG/0.5ML Pen; Inject 12.5 mg into the skin once a week.  Dispense: 2 mL; Refill: 0  2. Other disorder of eating with emotional eating Mood stable. Behavior modification techniques were discussed today to help [name] deal with his emotional/non-hunger eating behaviors.  Orders and follow up as documented in patient record. Pt declines increase in dose. Start exercising/walking.  Refill- buPROPion (WELLBUTRIN SR) 150 MG 12 hr tablet; Take 1 tablet (150 mg total) by mouth 2 (two) times daily.  Dispense: 60 tablet; Refill: 0  3. At risk for hypoglycemia [name] was given approximately 9 minutes of counseling today regarding prevention of hypoglycemia.  He was advised of symptoms of hypoglycemia.  [name] was instructed to avoid skipping meals and to eat regular protein-rich meals as prescribed on the meal plan.  The patient should have readily available low calorie snacks as needed, such as [employer] fruit snack packs, if blood sugar goes too low.   4. Obesity, Current BMI 36.3 [name] is currently in the action stage of change. As such, his goal is to continue with weight loss efforts. He has agreed to the Category 2 Plan with lunch options.   Go to the gym 3 days  a week for 1 hour each time.  Exercise goals: For substantial health benefits, adults should do at least 150 minutes (2 hours and 30 minutes) a week of moderate-intensity, or 75 minutes (1 hour and 15 minutes) a week of vigorous-intensity aerobic physical activity, or an equivalent combination of moderate- and vigorous-intensity aerobic activity. Aerobic activity should be performed in episodes of at least 10 minutes, and preferably, it should be spread throughout  the week.  Behavioral modification strategies: increasing lean protein intake and decreasing simple carbohydrates.  [name] has agreed to follow-up with our clinic in 3 weeks. He was informed of the importance of frequent follow-up visits to maximize his success with intensive lifestyle modifications for his multiple health conditions.   Objective:   Blood pressure 130/80, pulse 80, temperature 98 F (36.7 C), height 5\' 11"  (1.803 m), weight 260 lb (117.9 kg), SpO2 99 %. Body mass index is 36.26 kg/m.  General: Cooperative, alert, well developed, in no acute distress. HEENT: Conjunctivae and lids unremarkable. Cardiovascular: Regular rhythm.  Lungs: Normal work of breathing. Neurologic: No focal deficits.   Lab Results  Component Value Date   CREATININE 1.67 (H) 02/22/2021   BUN 27 (H) 02/22/2021   NA 139 02/22/2021   K 4.8 02/22/2021   CL 99 02/22/2021   CO2 22 02/22/2021   Lab Results  Component Value Date   ALT 24 10/31/2020   AST 20 10/31/2020   ALKPHOS 84 10/31/2020   BILITOT 0.9 10/31/2020   Lab Results  Component Value Date   HGBA1C 6.9 (H) 02/22/2021   HGBA1C 6.4 (H) 10/31/2020   HGBA1C 6.0 (H) 07/04/2020   HGBA1C 5.6 03/14/2020   HGBA1C 5.4 11/17/2019   Lab Results  Component Value Date   INSULIN 9.5 10/31/2020   INSULIN 28.9 (H) 09/01/2019   Lab Results  Component Value Date   TSH 4.170 10/31/2020   Lab Results  Component Value Date   CHOL 151 10/31/2020   HDL 53 10/31/2020   LDLCALC 76 10/31/2020   TRIG 122 10/31/2020   CHOLHDL 2.9 02/01/2020   Lab Results  Component Value Date   VD25OH 83.5 02/22/2021   VD25OH 84.9 10/31/2020   VD25OH 58.2 07/04/2020   Lab Results  Component Value Date   WBC 7.9 10/31/2020   HGB 15.2 10/31/2020   HCT 44.8 10/31/2020   MCV 91 10/31/2020   PLT 285 10/31/2020    Attestation Statements:   Reviewed by clinician on day of visit: allergies, medications, problem list, medical history, surgical history,  family history, social history, and previous encounter notes.  I, 11/02/2020, BS, CMA, am acting as transcriptionist for Kyung Rudd, DO.   I have reviewed the above documentation for accuracy and completeness, and I agree with the above. [employer], D.O.  The 21st Century Cures Act was signed into law in 2016 which includes the topic of electronic health records.  This provides immediate access to information in MyChart.  This includes consultation notes, operative notes, office notes, lab results and pathology reports.  If you have any questions about what you read please let 2017 know at your next visit so we can discuss your concerns and take corrective action if need be.  We are right here with you.

## 2021-09-20 ENCOUNTER — Encounter (INDEPENDENT_AMBULATORY_CARE_PROVIDER_SITE_OTHER): Payer: Self-pay

## 2021-09-28 ENCOUNTER — Encounter (INDEPENDENT_AMBULATORY_CARE_PROVIDER_SITE_OTHER): Payer: Self-pay | Admitting: Ophthalmology

## 2021-09-28 ENCOUNTER — Ambulatory Visit (INDEPENDENT_AMBULATORY_CARE_PROVIDER_SITE_OTHER): Payer: BC Managed Care – PPO | Admitting: Ophthalmology

## 2021-09-28 DIAGNOSIS — H35033 Hypertensive retinopathy, bilateral: Secondary | ICD-10-CM | POA: Diagnosis not present

## 2021-09-28 DIAGNOSIS — H2513 Age-related nuclear cataract, bilateral: Secondary | ICD-10-CM | POA: Diagnosis not present

## 2021-09-28 DIAGNOSIS — G4733 Obstructive sleep apnea (adult) (pediatric): Secondary | ICD-10-CM

## 2021-09-28 DIAGNOSIS — I10 Essential (primary) hypertension: Secondary | ICD-10-CM

## 2021-09-28 DIAGNOSIS — E113412 Type 2 diabetes mellitus with severe nonproliferative diabetic retinopathy with macular edema, left eye: Secondary | ICD-10-CM | POA: Diagnosis not present

## 2021-09-28 DIAGNOSIS — E113411 Type 2 diabetes mellitus with severe nonproliferative diabetic retinopathy with macular edema, right eye: Secondary | ICD-10-CM

## 2021-09-28 NOTE — Assessment & Plan Note (Signed)
OD, inferior thickening to the FAZ, will continue to monitor and observe.  Follow-up again in 4 months

## 2021-09-28 NOTE — Assessment & Plan Note (Signed)
OS much improved CSME inferior to FAZ, will continue to monitor and follow in 4 months

## 2021-09-28 NOTE — Assessment & Plan Note (Signed)
Age-appropriate trace color change, technically present not visually significant.

## 2021-09-28 NOTE — Assessment & Plan Note (Signed)
Continues to be compliant ?

## 2021-09-28 NOTE — Progress Notes (Signed)
09/28/2021     CHIEF COMPLAINT Patient presents for  Chief Complaint  Patient presents with   Diabetic Retinopathy with Macular Edema      HISTORY OF PRESENT ILLNESS: [name] is a 61 y.o. male who presents to the clinic today for:   HPI   4 mths dilate ouy oct color fp Pt states his vision has been stable Pt denies any new floaters or FOL   Last edited by Aleene Davidson, CMA on 09/28/2021  8:18 AM.      Referring physician: Marden Noble, MD [address] [place],  [place] [address]  HISTORICAL INFORMATION:   Selected notes from the MEDICAL RECORD NUMBER    Lab Results  Component Value Date   HGBA1C 6.9 (H) 02/22/2021     CURRENT MEDICATIONS: No current outpatient medications on file. (Ophthalmic Drugs)   No current facility-administered medications for this visit. (Ophthalmic Drugs)   Current Outpatient Medications (Other)  Medication Sig   amLODipine (NORVASC) 5 MG tablet Take 1 tablet (5 mg total) by mouth daily.   aspirin EC 81 MG tablet Take 81 mg by mouth daily.   atorvastatin (LIPITOR) 10 MG tablet Take 1 tablet (10 mg total) by mouth daily.   buPROPion (WELLBUTRIN SR) 150 MG 12 hr tablet Take 1 tablet (150 mg total) by mouth 2 (two) times daily.   carvedilol (COREG) 3.125 MG tablet Take 1 tablet (3.125 mg total) by mouth 2 (two) times daily.   cetirizine (ZYRTEC) 10 MG tablet Take 10 mg by mouth daily.   Cholecalciferol (VITAMIN D) 50 MCG (2000 UT) tablet Take 4,000 Units by mouth daily.    cloNIDine (CATAPRES) 0.2 MG tablet Take 1 tablet (0.2 mg total) by mouth every evening. Per pt as needed for high bp   glimepiride (AMARYL) 2 MG tablet TAKE 1 TABLET BY MOUTH IN THE MORNING AND AT BEDTIME   loratadine (CLARITIN) 10 MG tablet Take 10 mg by mouth daily.    losartan (COZAAR) 50 MG tablet Take 1 tablet (50 mg total) by mouth daily.   Magnesium 400 MG CAPS Take 400 mg by mouth daily.    metFORMIN (GLUCOPHAGE-XR) 500 MG 24 hr tablet  Take 1,000 mg by mouth 2 (two) times daily.    pantoprazole (PROTONIX) 40 MG tablet TAKE 1 TABLET BY MOUTH TWICE DAILY (Patient taking differently: Take 40 mg by mouth 2 (two) times daily.)   Spacer/Aero-Holding Chambers (AEROCHAMBER MV) inhaler Use as instructed (Patient taking differently: as needed. Use as instructed for cough)   tirzepatide (MOUNJARO) 12.5 MG/0.5ML Pen Inject 12.5 mg into the skin once a week.   vitamin B-12 (CYANOCOBALAMIN) 500 MCG tablet Take 500 mcg by mouth daily.    Vitamin D, Ergocalciferol, (DRISDOL) 1.25 MG (50000 UNIT) CAPS capsule 1 po q 2 wks   No current facility-administered medications for this visit. (Other)      REVIEW OF SYSTEMS: ROS   Negative for: Constitutional, Gastrointestinal, Neurological, Skin, Genitourinary, Musculoskeletal, HENT, Endocrine, Cardiovascular, Eyes, Respiratory, Psychiatric, Allergic/Imm, Heme/Lymph Last edited by Edmon Crape, MD on 09/28/2021  8:36 AM.       ALLERGIES No Known Allergies  PAST MEDICAL HISTORY Past Medical History:  Diagnosis Date   ADD (attention deficit disorder)    Anginal pain (HCC) 2012   Arthritis, shoulder region    B12 deficiency    Back pain    Coronary artery disease    Diabetes (HCC)    Diabetic retinopathy (HCC)  GERD (gastroesophageal reflux disease)    H/O heart artery stent 04/2010   Hyperlipidemia    Hypertension    Joint pain    Knee pain    Neuropathy    NSTEMI (non-ST elevated myocardial infarction) (HCC)    Obesity    Osteoarthritis    Persistent cough    stopped for a year   Sleep apnea    Swallowing difficulty    Swelling of both lower extremities    Past Surgical History:  Procedure Laterality Date   ACHILLES TENDON REPAIR Right    AMPUTATION TOE Right 03/14/2019   Procedure: AMPUTATION TOE, 5th toe;  Surgeon: [name], DPM;  Location: [facility] OR;  Service: Podiatry;  Laterality: Right;   heart stent  2012   x1   INCISION AND DRAINAGE Right 03/14/2019    Procedure: INCISION AND DRAINAGE;  Surgeon: [name], DPM;  Location: [facility] OR;  Service: Podiatry;  Laterality: Right;   SHOULDER ARTHROSCOPY Right    TEE WITHOUT CARDIOVERSION N/A 03/17/2019   Procedure: TRANSESOPHAGEAL ECHOCARDIOGRAM (TEE);  Surgeon: Chilton Si, MD;  Location: [facility] ENDOSCOPY;  Service: Cardiovascular;  Laterality: N/A;   TOE SURGERY Right 06/2018   for a hammer toe   TOTAL SHOULDER ARTHROPLASTY Right 11/19/2019   Procedure: TOTAL SHOULDER ARTHROPLASTY;  Surgeon: [name], MD;  Location: [facility] ORS;  Service: Orthopedics;  Laterality: Right;   UVULOPALATOPHARYNGOPLASTY      FAMILY HISTORY Family History  Problem Relation Age of Onset   Cancer Other    Diabetes Other    Heart disease Other    Breast cancer Mother    Cancer Mother    Heart disease Father    Diabetes Father    Stroke Father    Leukemia Brother    Asthma Maternal Uncle     SOCIAL HISTORY Social History   Tobacco Use   Smoking status: Never    Passive exposure: Yes   Smokeless tobacco: Never   Tobacco comments:    Both parents growing up.   Vaping Use   Vaping Use: Never used  Substance Use Topics   Alcohol use: Yes    Comment: 4-5 on weekends   Drug use: No         OPHTHALMIC EXAM:  Base Eye Exam     Visual Acuity (ETDRS)       Right Left   Dist cc 20/20 20/20         Tonometry (Tonopen, 8:21 AM)       Right Left   Pressure 15 17         Pupils       Pupils Shape   Right PERRL Round   Left PERRL          Extraocular Movement       Right Left    Ortho Ortho    -- -- --  --  --  -- -- --   -- -- --  --  --  -- -- --           Neuro/Psych     Oriented x3: Yes   Mood/Affect: Normal         Dilation     Both eyes: 1.0% Mydriacyl @ 8:20 AM           Slit Lamp and Fundus Exam     External Exam       Right Left   External Normal Normal         Slit Lamp  Exam       Right Left   Lids/Lashes Normal Normal    Conjunctiva/Sclera White and quiet White and quiet   Cornea Clear Clear   Anterior Chamber Deep and quiet Deep and quiet   Iris Round and reactive Round and reactive   Lens 1+ Nuclear sclerosis 1+ Nuclear sclerosis   Anterior Vitreous Normal Normal         Fundus Exam       Right Left   Posterior Vitreous Normal Normal   Disc Normal Normal   C/D Ratio 0.35 0.35   Macula Focal laser scars, Microaneurysms minor retinal thickening inferior improved Focal laser scars, Microaneurysms minor retinal thickening inferior improved   Vessels NPDR-Severe, with no cotton wool spots NPDR-Severe, with no cotton wool spots   Periphery Normal Normal            IMAGING AND PROCEDURES  Imaging and Procedures for 09/28/21  OCT, Retina - OU - Both Eyes       Right Eye Quality was good. Scan locations included subfoveal. Central Foveal Thickness: 346. Progression has improved. Findings include abnormal foveal contour, vitreomacular adhesion .   Left Eye Quality was good. Scan locations included subfoveal. Central Foveal Thickness: 352. Progression has been stable. Findings include abnormal foveal contour, vitreomacular adhesion .   Notes Diabetic CSME with focal leakages temporal OS and inferior to the fovea OS, and diabetic CSME really improved post focal we will continue to monitor  CSME OD, OD stable overall post focal laser treatment inferiorly.  We will continue to monitor     Color Fundus Photography Optos - OU - Both Eyes       Right Eye Progression has improved. Disc findings include normal observations. Macula : edema, microaneurysms.   Left Eye Progression has worsened. Disc findings include normal observations. Macula : edema, microaneurysms.   Notes No active cotton-wool spots seen posterior pole on each eye thus Not likely to have episodic hypertensive retinopathy today thus prior findings were secondary to episodic hypertension of untreated sleep apnea which have now  resolved in each eye.  Photos compared to those taken 2019.  Much improved, no ongoing cotton-wool spot formation             ASSESSMENT/PLAN:  Severe nonproliferative diabetic retinopathy of right eye, with macular edema, associated with type 2 diabetes mellitus (HCC) OD, inferior thickening to the FAZ, will continue to monitor and observe.  Follow-up again in 4 months  Severe nonproliferative diabetic retinopathy of left eye, with macular edema, associated with type 2 diabetes mellitus (HCC) OS much improved CSME inferior to FAZ, will continue to monitor and follow in 4 months  Nuclear sclerotic cataract of both eyes Age-appropriate trace color change, technically present not visually significant.  Hypertensive retinopathy of both eyes Old changes, not progressive, no signs of cotton-wool spots, no ongoing role retinal capillary closure.  Obstructive sleep apnea Continues to be compliant.     ICD-10-CM   1. Severe nonproliferative diabetic retinopathy of right eye, with macular edema, associated with type 2 diabetes mellitus (HCC)  E11.3411 OCT, Retina - OU - Both Eyes    Color Fundus Photography Optos - OU - Both Eyes    2. Severe nonproliferative diabetic retinopathy of left eye, with macular edema, associated with type 2 diabetes mellitus (HCC)  U13.2440     3. Nuclear sclerotic cataract of both eyes  H25.13     4. Hypertensive retinopathy of both eyes  H35.033  5. Obstructive sleep apnea  G47.33       1.  Excellent blood sugar maintain.  2.  Blood pressure is also better controlled.  No significant progression and severe NPDR.  3.  BiLateral CSME, OD stable and OS improving continue to monitor  Ophthalmic Meds Ordered this visit:  No orders of the defined types were placed in this encounter.      Return in about 5 months (around 02/28/2022) for DILATE OU, COLOR FP, OCT.  There are no Patient Instructions on file for this visit.   Explained the  diagnoses, plan, and follow up with the patient and they expressed understanding.  Patient expressed understanding of the importance of proper follow up care.   Alford Highland Loyed Wilmes M.D. Diseases & Surgery of the Retina and Vitreous [facility] 09/28/21     Abbreviations: M myopia (nearsighted); A astigmatism; H hyperopia (farsighted); P presbyopia; Mrx spectacle prescription;  CTL contact lenses; OD right eye; OS left eye; OU both eyes  XT exotropia; ET esotropia; PEK punctate epithelial keratitis; PEE punctate epithelial erosions; DES dry eye syndrome; MGD meibomian gland dysfunction; ATs artificial tears; PFAT's preservative free artificial tears; NSC nuclear sclerotic cataract; PSC posterior subcapsular cataract; ERM epi-retinal membrane; PVD posterior vitreous detachment; RD retinal detachment; DM diabetes mellitus; DR diabetic retinopathy; NPDR non-proliferative diabetic retinopathy; PDR proliferative diabetic retinopathy; CSME clinically significant macular edema; DME diabetic macular edema; dbh dot blot hemorrhages; CWS cotton wool spot; POAG primary open angle glaucoma; C/D cup-to-disc ratio; HVF humphrey visual field; GVF goldmann visual field; OCT optical coherence tomography; IOP intraocular pressure; BRVO Branch retinal vein occlusion; CRVO central retinal vein occlusion; CRAO central retinal artery occlusion; BRAO branch retinal artery occlusion; RT retinal tear; SB scleral buckle; PPV pars plana vitrectomy; VH Vitreous hemorrhage; PRP panretinal laser photocoagulation; IVK intravitreal kenalog; VMT vitreomacular traction; MH Macular hole;  NVD neovascularization of the disc; NVE neovascularization elsewhere; AREDS age related eye disease study; ARMD age related macular degeneration; POAG primary open angle glaucoma; EBMD epithelial/anterior basement membrane dystrophy; ACIOL anterior chamber intraocular lens; IOL intraocular lens; PCIOL posterior chamber intraocular lens;  Phaco/IOL phacoemulsification with intraocular lens placement; PRK photorefractive keratectomy; LASIK laser assisted in situ keratomileusis; HTN hypertension; DM diabetes mellitus; COPD chronic obstructive pulmonary disease

## 2021-09-28 NOTE — Assessment & Plan Note (Signed)
Old changes, not progressive, no signs of cotton-wool spots, no ongoing role retinal capillary closure.

## 2021-09-30 ENCOUNTER — Other Ambulatory Visit (INDEPENDENT_AMBULATORY_CARE_PROVIDER_SITE_OTHER): Payer: Self-pay | Admitting: Family Medicine

## 2021-09-30 DIAGNOSIS — F5089 Other specified eating disorder: Secondary | ICD-10-CM

## 2021-10-05 ENCOUNTER — Encounter (INDEPENDENT_AMBULATORY_CARE_PROVIDER_SITE_OTHER): Payer: Self-pay | Admitting: Family Medicine

## 2021-10-05 ENCOUNTER — Ambulatory Visit (INDEPENDENT_AMBULATORY_CARE_PROVIDER_SITE_OTHER): Payer: BC Managed Care – PPO | Admitting: Family Medicine

## 2021-10-05 ENCOUNTER — Encounter (INDEPENDENT_AMBULATORY_CARE_PROVIDER_SITE_OTHER): Payer: BC Managed Care – PPO | Admitting: Ophthalmology

## 2021-10-05 VITALS — BP 126/81 | HR 83 | Temp 97.5°F | Ht 71.0 in | Wt 256.0 lb

## 2021-10-05 DIAGNOSIS — E559 Vitamin D deficiency, unspecified: Secondary | ICD-10-CM | POA: Diagnosis not present

## 2021-10-05 DIAGNOSIS — E669 Obesity, unspecified: Secondary | ICD-10-CM

## 2021-10-05 DIAGNOSIS — F5089 Other specified eating disorder: Secondary | ICD-10-CM | POA: Diagnosis not present

## 2021-10-05 DIAGNOSIS — E1169 Type 2 diabetes mellitus with other specified complication: Secondary | ICD-10-CM | POA: Diagnosis not present

## 2021-10-05 DIAGNOSIS — Z6835 Body mass index (BMI) 35.0-35.9, adult: Secondary | ICD-10-CM

## 2021-10-05 DIAGNOSIS — Z7985 Long-term (current) use of injectable non-insulin antidiabetic drugs: Secondary | ICD-10-CM

## 2021-10-05 DIAGNOSIS — Z9189 Other specified personal risk factors, not elsewhere classified: Secondary | ICD-10-CM

## 2021-10-05 MED ORDER — VITAMIN D (ERGOCALCIFEROL) 1.25 MG (50000 UNIT) PO CAPS: ORAL_CAPSULE | ORAL | 0 refills | Status: DC

## 2021-10-05 MED ORDER — BUPROPION HCL ER (SR) 150 MG PO TB12: 150.0000 mg | ORAL_TABLET | Freq: Two times a day (BID) | ORAL | 0 refills | Status: DC

## 2021-10-05 MED ORDER — TIRZEPATIDE 15 MG/0.5ML ~~LOC~~ SOAJ: 15.0000 mg | SUBCUTANEOUS | 0 refills | Status: DC

## 2021-10-06 LAB — COMPREHENSIVE METABOLIC PANEL
ALT: 13 IU/L (ref 0–44)
AST: 14 IU/L (ref 0–40)
Albumin/Globulin Ratio: 1.7 (ref 1.2–2.2)
Albumin: 4.3 g/dL (ref 3.8–4.9)
Alkaline Phosphatase: 80 IU/L (ref 44–121)
BUN/Creatinine Ratio: 14 (ref 9–20)
BUN: 22 mg/dL (ref 6–24)
Bilirubin Total: 1 mg/dL (ref 0.0–1.2)
CO2: 22 mmol/L (ref 20–29)
Calcium: 9.7 mg/dL (ref 8.7–10.2)
Chloride: 96 mmol/L (ref 96–106)
Creatinine, Ser: 1.56 mg/dL — ABNORMAL HIGH (ref 0.76–1.27)
Globulin, Total: 2.6 g/dL (ref 1.5–4.5)
Glucose: 112 mg/dL — ABNORMAL HIGH (ref 70–99)
Potassium: 4.9 mmol/L (ref 3.5–5.2)
Sodium: 134 mmol/L (ref 134–144)
Total Protein: 6.9 g/dL (ref 6.0–8.5)
eGFR: 51 mL/min/{1.73_m2} — ABNORMAL LOW (ref >59)

## 2021-10-06 LAB — VITAMIN D 25 HYDROXY (VIT D DEFICIENCY, FRACTURES): Vit D, 25-Hydroxy: 73.1 ng/mL (ref 30.0–100.0)

## 2021-10-06 LAB — HEMOGLOBIN A1C
Est. average glucose Bld gHb Est-mCnc: 134 mg/dL
Hgb A1c MFr Bld: 6.3 % — ABNORMAL HIGH (ref 4.8–5.6)

## 2021-10-12 NOTE — Progress Notes (Signed)
Chief Complaint:   OBESITY [name] is here to discuss his progress with his obesity treatment plan along with follow-up of his obesity related diagnoses. [name] is on the Category 2 Plan with 6 oz protein at lunch and 8-10 oz at dinner and states he is following his eating plan approximately 60% of the time. [name] states he is not currently exercising.  Today's visit was #: 35 Starting weight: 310 lbs Starting date: 09/01/2019 Today's weight: 256 lbs Today's date: 10/05/2021 Total lbs lost to date: 54 Total lbs lost since last in-office visit: 4  Interim History: "I lost weight and didn't do shit." Breakfast is 2 eggs, 2 slices of toast, and coffee. Lunch is ham or [place] sandwich with fruit.  Dinner is beans and 45 calorie Hebrew hot dogs or lean cuisine.  [name] also did not go out much or drink much alcohol.    Subjective:   1. Type 2 diabetes mellitus with other specified complication, without long-term current use of insulin (HCC) Good blood sugar control is important to decrease the likelihood of diabetic complications such as nephropathy, neuropathy, limb loss, blindness, coronary artery disease, and death. Intensive lifestyle modification including diet, exercise and weight loss are the first line of treatment for diabetes.   2. Vitamin D deficiency He is currently taking OTC vitamin D 2000 IU each day. He denies nausea, vomiting or muscle weakness.  3. Other disorder of eating with emotional eating Pt is stable with no concerns.  4. At risk for heart disease [name] is at risk for heart disease due to hypertension, hyperlipidemia, and diabetes mellitus.  Assessment/Plan:   Orders Placed This Encounter  Procedures   VITAMIN D 25 Hydroxy (Vit-D Deficiency, Fractures)   Comprehensive metabolic panel   Hemoglobin A1c    Medications Discontinued During This Encounter  Medication Reason   Vitamin D, Ergocalciferol, (DRISDOL) 1.25 MG (50000 UNIT) CAPS capsule  Reorder   buPROPion (WELLBUTRIN SR) 150 MG 12 hr tablet Reorder     Meds ordered this encounter  Medications   buPROPion (WELLBUTRIN SR) 150 MG 12 hr tablet    Sig: Take 1 tablet (150 mg total) by mouth 2 (two) times daily.    Dispense:  60 tablet    Refill:  0   Vitamin D, Ergocalciferol, (DRISDOL) 1.25 MG (50000 UNIT) CAPS capsule    Sig: 1 po q 2 wks    Dispense:  6 capsule    Refill:  0    90 d supply   tirzepatide (MOUNJARO) 15 MG/0.5ML Pen    Sig: Inject 15 mg into the skin once a week.    Dispense:  2 mL    Refill:  0     1. Type 2 diabetes mellitus with other specified complication, without long-term current use of insulin (HCC) Good blood sugar control is important to decrease the likelihood of diabetic complications such as nephropathy, neuropathy, limb loss, blindness, coronary artery disease, and death. Intensive lifestyle modification including diet, exercise and weight loss are the first line of treatment for diabetes. Increase Mounjaro from 12.5 mg to 15 mg weekly.  Increase & Refill- tirzepatide (MOUNJARO) 15 MG/0.5ML Pen; Inject 15 mg into the skin once a week.  Dispense: 2 mL; Refill: 0  Lab/Orders today or future: - Comprehensive metabolic panel - Hemoglobin A1c  2. Vitamin D deficiency Low Vitamin D level contributes to fatigue and are associated with obesity, breast, and colon cancer. He agrees to continue to take prescription Vitamin D @  50,000 IU every 2 weeks and OTC Vit D 2000 IU daily will follow-up for routine testing of Vitamin D, at least 2-3 times per year to avoid over-replacement.  Refill- Vitamin D, Ergocalciferol, (DRISDOL) 1.25 MG (50000 UNIT) CAPS capsule; 1 po q 2 wks  Dispense: 6 capsule; Refill: 0  Lab/Orders today or future: - VITAMIN D 25 Hydroxy (Vit-D Deficiency, Fractures)  3. Other disorder of eating with emotional eating Behavior modification techniques were discussed today to help [name] deal with his emotional/non-hunger eating  behaviors.  Orders and follow up as documented in patient record.   Refill- buPROPion (WELLBUTRIN SR) 150 MG 12 hr tablet; Take 1 tablet (150 mg total) by mouth 2 (two) times daily.  Dispense: 60 tablet; Refill: 0  4. At risk for heart disease - [name] was given diabetes prevention education and counseling today of more than 10 minutes.  - Counseled patient on pathophysiology of disease and meaning/ implication of lab results.  - Reviewed how certain foods can either stimulate or inhibit insulin release, and subsequently affect hunger pathways  - Importance of following a healthy meal plan with limiting amounts of simple carbohydrates discussed with patient - Effects of regular aerobic exercise on blood sugar regulation reviewed and encouraged an eventual goal of 30 min 5d/week or more as a minimum.  - Briefly discussed treatment options, which always include dietary and lifestyle modification as first line.   - Handouts provided at patient's desire and/or told to go online to the American Diabetes Association website for further information.  5. Obesity, Current BMI 35.8 [name] is currently in the action stage of change. As such, his goal is to continue with weight loss efforts. He has agreed to the Category 2 Plan with 6 oz protein at lunch and 8-10 oz at dinner.   [name]'s All Natural Meals and recipes guide given to pt.  Exercise goals: All adults should avoid inactivity. Some physical activity is better than none, and adults who participate in any amount of physical activity gain some health benefits.  Behavioral modification strategies: increasing lean protein intake and decreasing simple carbohydrates.  [name] has agreed to follow-up with our clinic in 3-4 weeks. He was informed of the importance of frequent follow-up visits to maximize his success with intensive lifestyle modifications for his multiple health conditions.   [name] was informed we would discuss his lab results at his  next visit unless there is a critical issue that needs to be addressed sooner. [name] agreed to keep his next visit at the agreed upon time to discuss these results.  Objective:   Blood pressure 126/81, pulse 83, temperature (!) 97.5 F (36.4 C), height 5\' 11"  (1.803 m), weight 256 lb (116.1 kg), SpO2 98 %. Body mass index is 35.7 kg/m.  General: Cooperative, alert, well developed, in no acute distress. HEENT: Conjunctivae and lids unremarkable. Cardiovascular: Regular rhythm.  Lungs: Normal work of breathing. Neurologic: No focal deficits.   Lab Results  Component Value Date   CREATININE 1.56 (H) 10/05/2021   BUN 22 10/05/2021   NA 134 10/05/2021   K 4.9 10/05/2021   CL 96 10/05/2021   CO2 22 10/05/2021   Lab Results  Component Value Date   ALT 13 10/05/2021   AST 14 10/05/2021   ALKPHOS 80 10/05/2021   BILITOT 1.0 10/05/2021   Lab Results  Component Value Date   HGBA1C 6.3 (H) 10/05/2021   HGBA1C 6.9 (H) 02/22/2021   HGBA1C 6.4 (H) 10/31/2020   HGBA1C 6.0 (  H) 07/04/2020   HGBA1C 5.6 03/14/2020   Lab Results  Component Value Date   INSULIN 9.5 10/31/2020   INSULIN 28.9 (H) 09/01/2019   Lab Results  Component Value Date   TSH 4.170 10/31/2020   Lab Results  Component Value Date   CHOL 151 10/31/2020   HDL 53 10/31/2020   LDLCALC 76 10/31/2020   TRIG 122 10/31/2020   CHOLHDL 2.9 02/01/2020   Lab Results  Component Value Date   VD25OH 73.1 10/05/2021   VD25OH 83.5 02/22/2021   VD25OH 84.9 10/31/2020   Lab Results  Component Value Date   WBC 7.9 10/31/2020   HGB 15.2 10/31/2020   HCT 44.8 10/31/2020   MCV 91 10/31/2020   PLT 285 10/31/2020   Attestation Statements:   Reviewed by clinician on day of visit: allergies, medications, problem list, medical history, surgical history, family history, social history, and previous encounter notes.  I, Kyung Rudd, BS, CMA, am acting as transcriptionist for [employer], DO.   I have reviewed the  above documentation for accuracy and completeness, and I agree with the above. Carlye Grippe, D.O.  The 21st Century Cures Act was signed into law in 2016 which includes the topic of electronic health records.  This provides immediate access to information in MyChart.  This includes consultation notes, operative notes, office notes, lab results and pathology reports.  If you have any questions about what you read please let us know at your next visit so we can discuss your concerns and take corrective action if need be.  We are right here with you.

## 2021-10-15 DIAGNOSIS — M109 Gout, unspecified: Secondary | ICD-10-CM | POA: Diagnosis not present

## 2021-10-26 ENCOUNTER — Other Ambulatory Visit (INDEPENDENT_AMBULATORY_CARE_PROVIDER_SITE_OTHER): Payer: Self-pay | Admitting: Family Medicine

## 2021-10-26 DIAGNOSIS — F5089 Other specified eating disorder: Secondary | ICD-10-CM

## 2021-11-06 ENCOUNTER — Other Ambulatory Visit (HOSPITAL_COMMUNITY): Payer: Self-pay

## 2021-11-06 ENCOUNTER — Encounter (INDEPENDENT_AMBULATORY_CARE_PROVIDER_SITE_OTHER): Payer: Self-pay | Admitting: Family Medicine

## 2021-11-06 ENCOUNTER — Ambulatory Visit (INDEPENDENT_AMBULATORY_CARE_PROVIDER_SITE_OTHER): Payer: BC Managed Care – PPO | Admitting: Family Medicine

## 2021-11-06 VITALS — BP 140/88 | HR 87 | Temp 97.7°F | Ht 71.0 in | Wt 258.0 lb

## 2021-11-06 DIAGNOSIS — E1159 Type 2 diabetes mellitus with other circulatory complications: Secondary | ICD-10-CM

## 2021-11-06 DIAGNOSIS — Z6836 Body mass index (BMI) 36.0-36.9, adult: Secondary | ICD-10-CM

## 2021-11-06 DIAGNOSIS — E669 Obesity, unspecified: Secondary | ICD-10-CM

## 2021-11-06 DIAGNOSIS — E1169 Type 2 diabetes mellitus with other specified complication: Secondary | ICD-10-CM

## 2021-11-06 DIAGNOSIS — F5089 Other specified eating disorder: Secondary | ICD-10-CM | POA: Diagnosis not present

## 2021-11-06 DIAGNOSIS — Z7985 Long-term (current) use of injectable non-insulin antidiabetic drugs: Secondary | ICD-10-CM

## 2021-11-06 DIAGNOSIS — E559 Vitamin D deficiency, unspecified: Secondary | ICD-10-CM

## 2021-11-06 DIAGNOSIS — Z9189 Other specified personal risk factors, not elsewhere classified: Secondary | ICD-10-CM

## 2021-11-06 MED ORDER — VITAMIN D (ERGOCALCIFEROL) 1.25 MG (50000 UNIT) PO CAPS: ORAL_CAPSULE | ORAL | 0 refills | Status: DC

## 2021-11-06 MED ORDER — TIRZEPATIDE 12.5 MG/0.5ML ~~LOC~~ SOAJ
12.5000 mg | SUBCUTANEOUS | 1 refills | Status: DC
  Filled 2021-11-06: qty 2, 28d supply, fill #0

## 2021-11-06 MED ORDER — TIRZEPATIDE 12.5 MG/0.5ML ~~LOC~~ SOAJ
12.5000 mg | SUBCUTANEOUS | 0 refills | Status: DC
  Filled 2021-11-06: qty 2, 28d supply, fill #0

## 2021-11-06 MED ORDER — BUPROPION HCL ER (SR) 150 MG PO TB12: 150.0000 mg | ORAL_TABLET | Freq: Two times a day (BID) | ORAL | 0 refills | Status: DC

## 2021-11-07 ENCOUNTER — Encounter (INDEPENDENT_AMBULATORY_CARE_PROVIDER_SITE_OTHER): Payer: Self-pay | Admitting: Family Medicine

## 2021-11-07 ENCOUNTER — Other Ambulatory Visit (HOSPITAL_COMMUNITY): Payer: Self-pay

## 2021-11-07 MED ORDER — TIRZEPATIDE 15 MG/0.5ML ~~LOC~~ SOAJ
15.0000 mg | SUBCUTANEOUS | 0 refills | Status: DC
  Filled 2021-11-07: qty 2, 28d supply, fill #0

## 2021-11-09 NOTE — Progress Notes (Signed)
Chief Complaint:   OBESITY [name] is here to discuss his progress with his obesity treatment plan along with follow-up of his obesity related diagnoses. [name] is on the Category 2 Plan with 6 oz protein at lunch and 8-10 oz a dinner and states he is following his eating plan approximately 33% of the time. [name] states he is not currently exercising.  Today's visit was #: 59 Starting weight: 310 lbs Starting date: 09/01/2019 Today's weight: 258 lbs Today's date: 11/06/2021 Total lbs lost to date: 52 Total lbs lost since last in-office visit: +2  Interim History: Therin says, "I haven't been trying to lose weight the past month." Pt has not been exercising but loves the treadmill.  Subjective:   1. Vitamin D deficiency Discussed labs with patient today. He is currently taking prescription vitamin D 50,000 IU every 2 weeks. He denies nausea, vomiting or muscle weakness.  2. Type 2 diabetes mellitus with other specified complication, without long-term current use of insulin ([place]) Discussed labs with patient today. Pt with bilateral neuropathy with ulcers.  3. Other disorder of eating with emotional eating Discussed labs with patient today. Controlled. Medication: Wellbutrin.  4. At risk for heart disease Braxlee is at risk for heart disease due to diabetes mellitus, hypertension, and hyperlipidemia.  Assessment/Plan:  No orders of the defined types were placed in this encounter.   Medications Discontinued During This Encounter  Medication Reason   tirzepatide (MOUNJARO) 12.5 MG/0.5ML Pen Reorder   buPROPion (WELLBUTRIN SR) 150 MG 12 hr tablet Reorder   Vitamin D, Ergocalciferol, (DRISDOL) 1.25 MG (50000 UNIT) CAPS capsule    tirzepatide (MOUNJARO) 12.5 MG/0.5ML Pen    tirzepatide (MOUNJARO) 15 MG/0.5ML Pen Reorder   tirzepatide (MOUNJARO) 12.5 MG/0.5ML Pen      Meds ordered this encounter  Medications   DISCONTD: tirzepatide (MOUNJARO) 12.5 MG/0.5ML Pen    Sig:  Inject 12.5 mg into the skin once a week.    Dispense:  2 mL    Refill:  0   buPROPion (WELLBUTRIN SR) 150 MG 12 hr tablet    Sig: Take 1 tablet (150 mg total) by mouth 2 (two) times daily.    Dispense:  60 tablet    Refill:  0   Vitamin D, Ergocalciferol, (DRISDOL) 1.25 MG (50000 UNIT) CAPS capsule    Sig: 1 po q 3 wks    Dispense:  6 capsule    Refill:  0    90 d supply   DISCONTD: tirzepatide (MOUNJARO) 12.5 MG/0.5ML Pen    Sig: Inject 12.5 mg into the skin once a week.    Dispense:  2 mL    Refill:  1   tirzepatide (MOUNJARO) 15 MG/0.5ML Pen    Sig: Inject 15 mg into the skin once a week.    Dispense:  2 mL    Refill:  0   tirzepatide (MOUNJARO) 12.5 MG/0.5ML Pen    Sig: Inject 12.5 mg into the skin once a week.    Dispense:  2 mL    Refill:  0     1. Vitamin D deficiency Vit D still a little above goal at 73.1. Low Vitamin D level contributes to fatigue and are associated with obesity, breast, and colon cancer. He agrees to change to take prescription Vitamin D 50,000 IU every 3 weeks and will follow-up for routine testing of Vitamin D, at least 2-3 times per year to avoid over-replacement.  Change & Refill- Vitamin D, Ergocalciferol, (DRISDOL) 1.25  MG (50000 UNIT) CAPS capsule; 1 po q 3 wks  Dispense: 6 capsule; Refill: 0  2. Type 2 diabetes mellitus with other specified complication, without long-term current use of insulin (HCC) Good blood sugar control is important to decrease the likelihood of diabetic complications such as nephropathy, neuropathy, limb loss, blindness, coronary artery disease, and death. Intensive lifestyle modification including diet, exercise and weight loss are the first line of treatment for diabetes.   Refill- tirzepatide (MOUNJARO) 15 MG/0.5ML Pen; Inject 15 mg into the skin once a week.  Dispense: 2 mL; Refill: 0  3. Other disorder of eating with emotional eating Behavior modification techniques were discussed today to help Curren deal with his  emotional/non-hunger eating behaviors.  Orders and follow up as documented in patient record.   Refill- buPROPion (WELLBUTRIN SR) 150 MG 12 hr tablet; Take 1 tablet (150 mg total) by mouth 2 (two) times daily.  Dispense: 60 tablet; Refill: 0  4. At risk for heart disease Due to [name]'s current state of health and medical condition(s), he is at a higher risk for heart disease.  This puts the patient at much greater risk to subsequently develop cardiopulmonary conditions that can significantly affect patient's quality of life in a negative manner as well.    At least 15 minutes were spent on counseling DEMONTRE PADIN about these concerns today, and we discussed the importance of reversing risks factors of obesity, especially truncal and visceral fat, hypertension, hyperlipidemia, and pre-diabetes.  The initial goal is to lose at least 5-10% of starting weight to help reduce these risk factors.  Counseling: Intensive lifestyle modifications were discussed with Dicie Beam as the most appropriate first line of treatment.  he will continue to work on diet, exercise, and weight loss efforts.  We will continue to reassess these conditions on a fairly regular basis in an attempt to decrease the patient's overall morbidity and mortality.  Evidence-based interventions for health behavior change were utilized today including the discussion of self monitoring techniques, problem-solving barriers, and SMART goal setting techniques.  Specifically, regarding patient's less desirable eating habits and patterns, we employed the technique of small changes when Dicie Beam has not been able to fully commit to his prudent nutritional plan.  5. Obesity, Current BMI 36.1 [name] is currently in the action stage of change. As such, his goal is to continue with weight loss efforts. He has agreed to the Category 2 Plan wit 6 oz protein at lunch and 8-10 oz at dinner.   Exercise goals:  As is  Behavioral  modification strategies: increasing lean protein intake, decreasing simple carbohydrates, and better snacking choices.  [name] has agreed to follow-up with our clinic in 3 weeks. He was informed of the importance of frequent follow-up visits to maximize his success with intensive lifestyle modifications for his multiple health conditions.   Objective:   Blood pressure (!) 140/88, pulse 87, temperature 97.7 F (36.5 C), height 5\' 11"  (1.803 m), weight 258 lb (117 kg), SpO2 98 %. Body mass index is 35.98 kg/m.  General: Cooperative, alert, well developed, in no acute distress. HEENT: Conjunctivae and lids unremarkable. Cardiovascular: Regular rhythm.  Lungs: Normal work of breathing. Neurologic: No focal deficits.   Lab Results  Component Value Date   CREATININE 1.56 (H) 10/05/2021   BUN 22 10/05/2021   NA 134 10/05/2021   K 4.9 10/05/2021   CL 96 10/05/2021   CO2 22 10/05/2021   Lab Results  Component Value  Date   ALT 13 10/05/2021   AST 14 10/05/2021   ALKPHOS 80 10/05/2021   BILITOT 1.0 10/05/2021   Lab Results  Component Value Date   HGBA1C 6.3 (H) 10/05/2021   HGBA1C 6.9 (H) 02/22/2021   HGBA1C 6.4 (H) 10/31/2020   HGBA1C 6.0 (H) 07/04/2020   HGBA1C 5.6 03/14/2020   Lab Results  Component Value Date   INSULIN 9.5 10/31/2020   INSULIN 28.9 (H) 09/01/2019   Lab Results  Component Value Date   TSH 4.170 10/31/2020   Lab Results  Component Value Date   CHOL 151 10/31/2020   HDL 53 10/31/2020   LDLCALC 76 10/31/2020   TRIG 122 10/31/2020   CHOLHDL 2.9 02/01/2020   Lab Results  Component Value Date   VD25OH 73.1 10/05/2021   VD25OH 83.5 02/22/2021   VD25OH 84.9 10/31/2020   Lab Results  Component Value Date   WBC 7.9 10/31/2020   HGB 15.2 10/31/2020   HCT 44.8 10/31/2020   MCV 91 10/31/2020   PLT 285 10/31/2020    Attestation Statements:   Reviewed by clinician on day of visit: allergies, medications, problem list, medical history, surgical  history, family history, social history, and previous encounter notes.  I, Kathlene November, BS, CMA, am acting as transcriptionist for [employer], DO.   I have reviewed the above documentation for accuracy and completeness, and I agree with the above. Marjory Sneddon, D.O.  [place] was signed into law in 2016 which includes the topic of electronic health records.  This provides immediate access to information in MyChart.  This includes consultation notes, operative notes, office notes, lab results and pathology reports.  If you have any questions about what you read please let us know at your next visit so we can discuss your concerns and take corrective action if need be.  We are right here with you.

## 2021-11-11 MED ORDER — TIRZEPATIDE 12.5 MG/0.5ML ~~LOC~~ SOAJ
12.5000 mg | SUBCUTANEOUS | 0 refills | Status: DC
  Filled 2021-11-11: qty 2, 28d supply, fill #0

## 2021-11-13 ENCOUNTER — Other Ambulatory Visit (HOSPITAL_COMMUNITY): Payer: Self-pay

## 2021-11-13 DIAGNOSIS — G4733 Obstructive sleep apnea (adult) (pediatric): Secondary | ICD-10-CM | POA: Diagnosis not present

## 2021-11-13 DIAGNOSIS — I10 Essential (primary) hypertension: Secondary | ICD-10-CM | POA: Diagnosis not present

## 2021-11-17 ENCOUNTER — Other Ambulatory Visit (HOSPITAL_COMMUNITY): Payer: Self-pay

## 2021-11-17 DIAGNOSIS — I10 Essential (primary) hypertension: Secondary | ICD-10-CM | POA: Diagnosis not present

## 2021-11-21 ENCOUNTER — Ambulatory Visit: Payer: BC Managed Care – PPO | Admitting: Podiatry

## 2021-11-21 ENCOUNTER — Ambulatory Visit: Payer: BC Managed Care – PPO

## 2021-11-21 DIAGNOSIS — E1140 Type 2 diabetes mellitus with diabetic neuropathy, unspecified: Secondary | ICD-10-CM | POA: Diagnosis not present

## 2021-11-21 DIAGNOSIS — L97512 Non-pressure chronic ulcer of other part of right foot with fat layer exposed: Secondary | ICD-10-CM

## 2021-11-21 DIAGNOSIS — M79671 Pain in right foot: Secondary | ICD-10-CM

## 2021-11-21 DIAGNOSIS — M86171 Other acute osteomyelitis, right ankle and foot: Secondary | ICD-10-CM | POA: Diagnosis not present

## 2021-11-21 DIAGNOSIS — Z89421 Acquired absence of other right toe(s): Secondary | ICD-10-CM | POA: Diagnosis not present

## 2021-11-21 MED ORDER — DOXYCYCLINE HYCLATE 100 MG PO TABS: 100.0000 mg | ORAL_TABLET | Freq: Two times a day (BID) | ORAL | 0 refills | Status: AC

## 2021-11-21 NOTE — Progress Notes (Signed)
Subjective:  Patient ID: [name], male    DOB: 04-Nov-1960,  MRN: [id number]  Chief Complaint  Patient presents with   Wound Check    Room 1  3rd toe on right foot , at least 1 month, pt has noticed bleeding from area  Also a wound of left foot , middle area , pt states this has been on and off for at least year     61 y.o. male presents with concern for ulceration of the dorsal aspect of the right fourth toe.  He has a previous history of having partial 5th ray amputation done by Dr. Cannon Kettle in the past.  He is not exactly sure how long this wound has been present on the top of the right fourth toe.  He does have a history of diabetes type 2 with neuropathy.  Last A1c 6.3.  He has been covering the wound with a Band-Aid.  Has noticed that the toe has been swollen for a very long time.  Has seen some drainage come from the area but nothing that appears to be pus.  Not currently on antibiotics.  Past Medical History:  Diagnosis Date   ADD (attention deficit disorder)    Anginal pain ([place]) 2012   Arthritis, shoulder region    B12 deficiency    Back pain    Coronary artery disease    Diabetes ([place])    Diabetic retinopathy ([place])    GERD (gastroesophageal reflux disease)    H/O heart artery stent 04/2010   Hyperlipidemia    Hypertension    Joint pain    Knee pain    Neuropathy    NSTEMI (non-ST elevated myocardial infarction) ([place])    Obesity    Osteoarthritis    Persistent cough    stopped for a year   Sleep apnea    Swallowing difficulty    Swelling of both lower extremities     No Known Allergies  ROS: Negative except as per HPI above  Objective:  General: AAO x3, NAD  Dermatological: Attention directed to the fourth toe on the right foot there is noted to be dorsal ulceration present overlying the middle phalanx.  There is significant erythema and edema of the right fourth toe.  There is no active purulence able to be expressed from the wound.  The wound does  probe to bone.  The wound base has mostly granular tissue.  No malodor no fluctuance.  Also with hypekeratotic tissue present dorsal 3rd toe as well as abrasion distal 2nd toe. See clinical picture below  Vascular:  Dorsalis Pedis artery and Posterior Tibial artery pedal pulses are 2/4 bilateral.  Capillary fill time < 3 sec to all digits.   Neruologic: Grossly diminished via light touch bilaterally protective sensation absent to the forefoot.  Musculoskeletal: Stat post prior partial fifth ray amputation right foot  Gait: Unassisted, Nonantalgic.     Radiographs:  Date: 11/21/2021 XR right foot weightbearing AP/Lateral/Oblique   Findings: Attention did to the fourth toe of the right foot there is concern for possible pathologic fracture of the middle phalanx/proximal phalanx head.  Also with concern for possible osteolysis and erosions of the middle phalanx consistent with osteomyelitis.  No soft tissue emphysema present. Assessment:   1. Right foot ulcer, with fat layer exposed ([place])   2. Acute osteomyelitis of right ankle or foot ([place])   3. History of amputation of lesser toe of right foot ([place])   4. Type 2 diabetes mellitus with  diabetic neuropathy, without long-term current use of insulin ([place])      Plan:  Patient was evaluated and treated and all questions answered.  #Ulceration of the dorsal aspect of the right fourth toe with fat layer exposed, concern for osteomyelitis of the middle phalanx right fourth toe -We discussed the etiology and factors that are a part of the wound healing process.  We also discussed the risk of infection both soft tissue and osteomyelitis from open ulceration.  Discussed the risk of limb loss if this happens or worsens. -No debridement indicated at this time -Clinical and radiographic concern for osteomyelitis, will send for ESR CRP and MRI of the right foot -Dressed with Betadine, DSD. -Continue home dressing changes daily with Betadine and  adhesive bandage -Vascular testing ordered, though patient has palpable pulses would still like to have a baseline in the event he does need amputation. -HgbA1c: Last 6.3 -Last antibiotics: Begin a course of doxycycline 100 mg twice daily for 10 days for right fourth toe erythema edema concern for osteomyelitis -Imaging: x-ray reviewed, shows possible erosion or infection. Will order MRI to further evaluate.   Return in about 3 weeks (around 12/12/2021) for R 4th toe wound.       No follow-ups on file.          Everitt Amber, DPM [facility]

## 2021-11-22 LAB — C-REACTIVE PROTEIN: CRP: 13 mg/L — ABNORMAL HIGH (ref 0–10)

## 2021-11-22 LAB — SEDIMENTATION RATE: Sed Rate: 34 mm/hr — ABNORMAL HIGH (ref 0–30)

## 2021-11-23 ENCOUNTER — Telehealth: Payer: Self-pay | Admitting: *Deleted

## 2021-11-23 NOTE — Telephone Encounter (Signed)
Patient is calling for status of MRI scheduling.  Called and spoke th [facility], in process of scheduling and will contact patient today,notified patient thru voice message.

## 2021-11-27 ENCOUNTER — Other Ambulatory Visit (INDEPENDENT_AMBULATORY_CARE_PROVIDER_SITE_OTHER): Payer: Self-pay | Admitting: Family Medicine

## 2021-11-27 DIAGNOSIS — F5089 Other specified eating disorder: Secondary | ICD-10-CM

## 2021-12-05 ENCOUNTER — Ambulatory Visit
Admission: RE | Admit: 2021-12-05 | Discharge: 2021-12-05 | Disposition: A | Discharge status: Home / Self Care | Payer: BC Managed Care – PPO | Source: Ambulatory Visit | Attending: Podiatry | Admitting: Podiatry

## 2021-12-05 DIAGNOSIS — S92341A Displaced fracture of fourth metatarsal bone, right foot, initial encounter for closed fracture: Secondary | ICD-10-CM | POA: Diagnosis not present

## 2021-12-05 DIAGNOSIS — Z981 Arthrodesis status: Secondary | ICD-10-CM | POA: Diagnosis not present

## 2021-12-05 DIAGNOSIS — L97512 Non-pressure chronic ulcer of other part of right foot with fat layer exposed: Secondary | ICD-10-CM

## 2021-12-05 DIAGNOSIS — M86171 Other acute osteomyelitis, right ankle and foot: Secondary | ICD-10-CM | POA: Diagnosis not present

## 2021-12-05 DIAGNOSIS — M7989 Other specified soft tissue disorders: Secondary | ICD-10-CM | POA: Diagnosis not present

## 2021-12-06 ENCOUNTER — Telehealth: Payer: Self-pay | Admitting: Internal Medicine

## 2021-12-06 ENCOUNTER — Other Ambulatory Visit: Payer: Self-pay | Admitting: Podiatry

## 2021-12-06 ENCOUNTER — Ambulatory Visit: Payer: BC Managed Care – PPO | Attending: Podiatry

## 2021-12-06 DIAGNOSIS — L97512 Non-pressure chronic ulcer of other part of right foot with fat layer exposed: Secondary | ICD-10-CM

## 2021-12-06 DIAGNOSIS — M86171 Other acute osteomyelitis, right ankle and foot: Secondary | ICD-10-CM

## 2021-12-06 NOTE — Telephone Encounter (Signed)
Patient would like his test results sent to Dr. Landis Martins at [facility].

## 2021-12-07 ENCOUNTER — Other Ambulatory Visit (INDEPENDENT_AMBULATORY_CARE_PROVIDER_SITE_OTHER): Payer: Self-pay | Admitting: Family Medicine

## 2021-12-07 DIAGNOSIS — E1169 Type 2 diabetes mellitus with other specified complication: Secondary | ICD-10-CM

## 2021-12-07 DIAGNOSIS — F5089 Other specified eating disorder: Secondary | ICD-10-CM

## 2021-12-07 NOTE — Telephone Encounter (Signed)
There is no recent test that Christen Bame, NP ordered.

## 2021-12-08 ENCOUNTER — Other Ambulatory Visit (HOSPITAL_COMMUNITY): Payer: Self-pay

## 2021-12-08 DIAGNOSIS — E1140 Type 2 diabetes mellitus with diabetic neuropathy, unspecified: Secondary | ICD-10-CM | POA: Diagnosis not present

## 2021-12-08 DIAGNOSIS — L97514 Non-pressure chronic ulcer of other part of right foot with necrosis of bone: Secondary | ICD-10-CM | POA: Diagnosis not present

## 2021-12-08 DIAGNOSIS — M86171 Other acute osteomyelitis, right ankle and foot: Secondary | ICD-10-CM | POA: Diagnosis not present

## 2021-12-11 ENCOUNTER — Telehealth (INDEPENDENT_AMBULATORY_CARE_PROVIDER_SITE_OTHER): Payer: Self-pay | Admitting: Family Medicine

## 2021-12-11 ENCOUNTER — Other Ambulatory Visit (INDEPENDENT_AMBULATORY_CARE_PROVIDER_SITE_OTHER): Payer: Self-pay | Admitting: Family Medicine

## 2021-12-11 ENCOUNTER — Other Ambulatory Visit (HOSPITAL_COMMUNITY): Payer: Self-pay

## 2021-12-11 DIAGNOSIS — F5089 Other specified eating disorder: Secondary | ICD-10-CM

## 2021-12-11 DIAGNOSIS — E1169 Type 2 diabetes mellitus with other specified complication: Secondary | ICD-10-CM

## 2021-12-11 MED ORDER — BUPROPION HCL ER (SR) 150 MG PO TB12: 150.0000 mg | ORAL_TABLET | Freq: Two times a day (BID) | ORAL | 0 refills | Status: DC

## 2021-12-11 MED ORDER — TIRZEPATIDE 15 MG/0.5ML ~~LOC~~ SOAJ
15.0000 mg | SUBCUTANEOUS | 0 refills | Status: DC
  Filled 2021-12-11: qty 2, 28d supply, fill #0

## 2021-12-11 NOTE — Telephone Encounter (Signed)
LAST APPOINTMENT DATE: 11/06/21 NEXT APPOINTMENT DATE: 12/25/21   [place] [address] [facility], [place] [address] [place] [address] Phone: [phone] Fax: [phone]  [place] [address] [place] [name] [place] [address] Phone: [phone] Fax: [place] [address] Phone: [phone] Fax: [phone]  Patient is requesting a refill of the following medications: Requested Prescriptions   Signed Prescriptions Disp Refills   buPROPion (WELLBUTRIN SR) 150 MG 12 hr tablet 60 tablet 0    Sig: Take 1 tablet (150 mg total) by mouth 2 (two) times daily.    Authorizing Provider: Mellody Dance    Ordering User: Alisan Dokes S   tirzepatide (MOUNJARO) 15 MG/0.5ML Pen 2 mL 0    Sig: Inject 15 mg into the skin once a week.    Authorizing Provider: Mellody Dance    Ordering User: Eileene Kisling, [place]    Date last filled: 11/06/21 Previously prescribed by Dr.Opalski  Lab Results  Component Value Date   HGBA1C 6.3 (H) 10/05/2021   HGBA1C 6.9 (H) 02/22/2021   HGBA1C 6.4 (H) 10/31/2020   Lab Results  Component Value Date   LDLCALC 76 10/31/2020   CREATININE 1.56 (H) 10/05/2021   Lab Results  Component Value Date   VD25OH 73.1 10/05/2021   VD25OH 83.5 02/22/2021   VD25OH 84.9 10/31/2020    BP Readings from Last 3 Encounters:  11/06/21 (!) 140/88  10/05/21 126/81  09/07/21 130/80

## 2021-12-11 NOTE — Telephone Encounter (Signed)
Refills sent in. Ok per Dr. Raliegh Scarlet.

## 2021-12-11 NOTE — Telephone Encounter (Signed)
Patient called wanting refills of Mounjaro and Bupropion asap. Patient is out of the prescriptions.

## 2021-12-12 ENCOUNTER — Ambulatory Visit (INDEPENDENT_AMBULATORY_CARE_PROVIDER_SITE_OTHER): Payer: BC Managed Care – PPO | Admitting: Podiatry

## 2021-12-12 DIAGNOSIS — L97512 Non-pressure chronic ulcer of other part of right foot with fat layer exposed: Secondary | ICD-10-CM | POA: Diagnosis not present

## 2021-12-12 DIAGNOSIS — M86171 Other acute osteomyelitis, right ankle and foot: Secondary | ICD-10-CM

## 2021-12-12 DIAGNOSIS — E1140 Type 2 diabetes mellitus with diabetic neuropathy, unspecified: Secondary | ICD-10-CM | POA: Diagnosis not present

## 2021-12-12 DIAGNOSIS — Z89421 Acquired absence of other right toe(s): Secondary | ICD-10-CM

## 2021-12-13 ENCOUNTER — Other Ambulatory Visit (HOSPITAL_COMMUNITY): Payer: Self-pay

## 2021-12-13 NOTE — Progress Notes (Signed)
Subjective:  Patient ID: BRANTLY KALMAN, male    DOB: [birth year],  MRN: [id number]  Chief Complaint  Patient presents with   Wound Check    Right foot ulcer. Patient is changing dressing daily.     61 y.o. male presents for follow up for ulceration of the dorsal aspect of the right fourth toe.  He was sent for MRI after last visit, here to follow up results. Has been using betadine and gauze dressing. He thinks wound has been improving. Finished a course of PO abx, doxycycline x 2 weeks. Denies n/V/F/C.    Past Medical History:  Diagnosis Date   ADD (attention deficit disorder)    Anginal pain (HCC) 2012   Arthritis, shoulder region    B12 deficiency    Back pain    Coronary artery disease    Diabetes (HCC)    Diabetic retinopathy (HCC)    GERD (gastroesophageal reflux disease)    H/O heart artery stent 04/2010   Hyperlipidemia    Hypertension    Joint pain    Knee pain    Neuropathy    NSTEMI (non-ST elevated myocardial infarction) (HCC)    Obesity    Osteoarthritis    Persistent cough    stopped for a year   Sleep apnea    Swallowing difficulty    Swelling of both lower extremities     No Known Allergies  ROS: Negative except as per HPI above  Objective:  General: AAO x3, NAD  Dermatological: Attention directed to the fourth toe on the right foot there is noted to be dorsal ulceration present overlying the middle phalanx.  There is decreased erythema and edema of the right fourth toe.  There is no active purulence able to be expressed from the wound.  Wound now with eschar present, dried out from prior.   Vascular:  Dorsalis Pedis artery and Posterior Tibial artery pedal pulses are 2/4 bilateral.  Capillary fill time < 3 sec to all digits.   Neruologic: Grossly diminished via light touch bilaterally protective sensation absent to the forefoot.  Musculoskeletal: Stat post prior partial fifth ray amputation right foot  Gait: Unassisted, Nonantalgic.      Radiographs:  Date: 11/21/2021 XR right foot weightbearing AP/Lateral/Oblique   Findings: Attention did to the fourth toe of the right foot there is concern for possible pathologic fracture of the middle phalanx/proximal phalanx head.  Also with concern for possible osteolysis and erosions of the middle phalanx consistent with osteomyelitis.  No soft tissue emphysema present.  MRI R Foot WO contrast 10/24 Osteomyelitis of the fourth toe proximal and middle phalanges and early osteomyelitis of the distal phalanx. Confluent edema of the fourth toe without definite soft tissue abscess. Lateral forefoot soft tissue swelling. Assessment:   1. Right foot ulcer, with fat layer exposed (HCC)   2. Acute osteomyelitis of right ankle or foot (HCC)   3. History of amputation of lesser toe of right foot (HCC)   4. Type 2 diabetes mellitus with diabetic neuropathy, without long-term current use of insulin (HCC)       Plan:  Patient was evaluated and treated and all questions answered.  #Ulceration of the dorsal aspect of the right fourth toe with fat layer exposed, concern for osteomyelitis of the middle phalanx right fourth toe -Discussed the MRI results with the patient,  findings concerning for osteomyelitis of the right 4th toe - Recommend amputation of the right 4th toe despite the toe looking slightly better after  PO abx and wound care - Pt states he wants a second opinion and will seek this out - As no signs of worsening infection pt is ok to proceed with the plan but should understand if worsening redness swelling or drainage or spreading infection into foot will need to go to hospital for acute management. He is at high risk of eventually needing more proximal amputation in the foot TMA level and this was discussed at this visit. - Continue local wound care for now with betadine and dressing daily. - Pt will call us if he wishes to proceed with surgical intervention with me after his  2nd opinion.   Return if symptoms worsen or fail to improve.          Everitt Amber, DPM [facility]

## 2021-12-17 IMAGING — DX DG CHEST 1V PORT
1 series · 1 of 1 positions shown · non-contrast
Comparison: CT 11/30/2015, radiograph 09/26/2015

CLINICAL DATA: Shortness of breath, fever, cough

EXAM:
PORTABLE CHEST 1 VIEW

[chest ap]
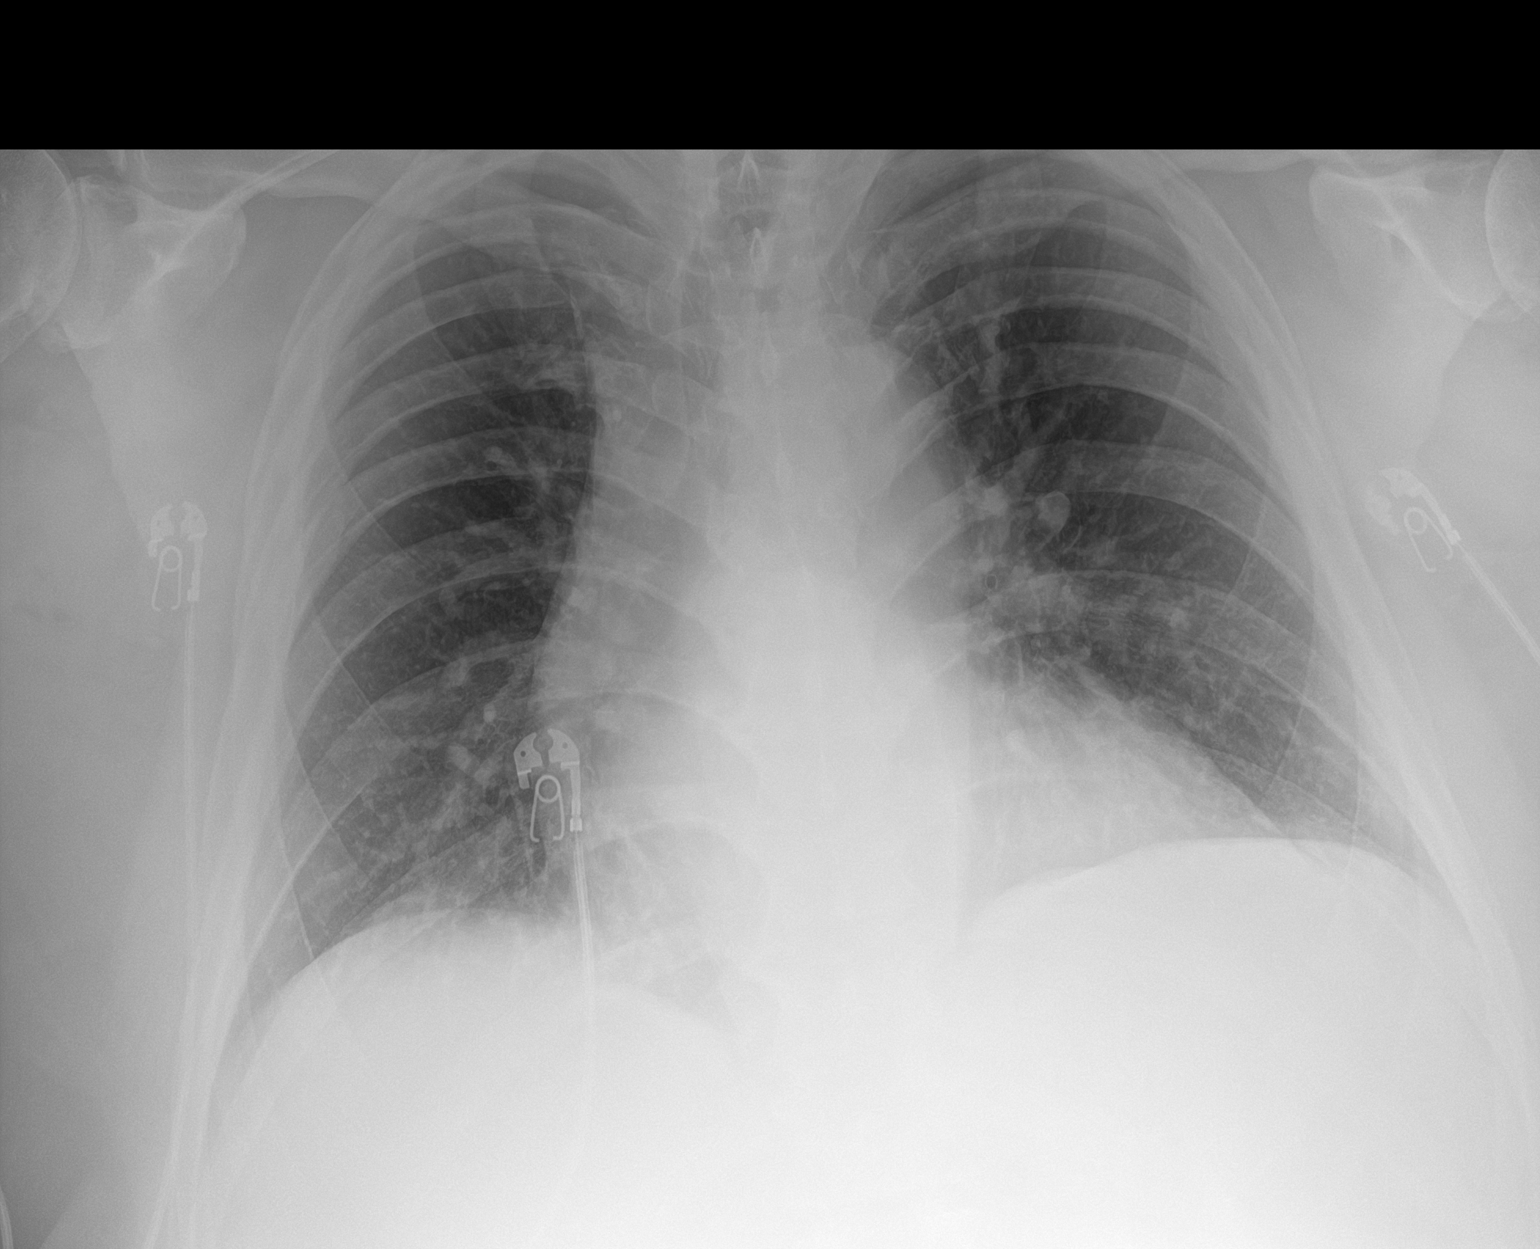

[1 of 1 positions shown; findings below may reference images not displayed]

FINDINGS: Accounting for some atelectasis in the bases and patient body
habitus, the lungs are clear. No consolidation, features of edema,
pneumothorax, or effusion. The cardiomediastinal contours are
unremarkable. No acute osseous or soft tissue abnormality.
IMPRESSION: Accounting for body habitus, the lungs are clear.

## 2021-12-19 DIAGNOSIS — E1159 Type 2 diabetes mellitus with other circulatory complications: Secondary | ICD-10-CM | POA: Diagnosis not present

## 2021-12-19 DIAGNOSIS — M205X1 Other deformities of toe(s) (acquired), right foot: Secondary | ICD-10-CM | POA: Diagnosis not present

## 2021-12-19 DIAGNOSIS — L97514 Non-pressure chronic ulcer of other part of right foot with necrosis of bone: Secondary | ICD-10-CM | POA: Diagnosis not present

## 2021-12-19 DIAGNOSIS — Z7984 Long term (current) use of oral hypoglycemic drugs: Secondary | ICD-10-CM | POA: Diagnosis not present

## 2021-12-19 DIAGNOSIS — M24574 Contracture, right foot: Secondary | ICD-10-CM | POA: Diagnosis not present

## 2021-12-19 DIAGNOSIS — M86171 Other acute osteomyelitis, right ankle and foot: Secondary | ICD-10-CM | POA: Diagnosis not present

## 2021-12-19 DIAGNOSIS — E11621 Type 2 diabetes mellitus with foot ulcer: Secondary | ICD-10-CM | POA: Diagnosis not present

## 2021-12-19 DIAGNOSIS — I10 Essential (primary) hypertension: Secondary | ICD-10-CM | POA: Diagnosis not present

## 2021-12-19 DIAGNOSIS — T8753 Necrosis of amputation stump, right lower extremity: Secondary | ICD-10-CM | POA: Diagnosis not present

## 2021-12-19 DIAGNOSIS — Y835 Amputation of limb(s) as the cause of abnormal reaction of the patient, or of later complication, without mention of misadventure at the time of the procedure: Secondary | ICD-10-CM | POA: Diagnosis not present

## 2021-12-19 DIAGNOSIS — M19071 Primary osteoarthritis, right ankle and foot: Secondary | ICD-10-CM | POA: Diagnosis not present

## 2021-12-19 DIAGNOSIS — Z7985 Long-term (current) use of injectable non-insulin antidiabetic drugs: Secondary | ICD-10-CM | POA: Diagnosis not present

## 2021-12-19 DIAGNOSIS — E1140 Type 2 diabetes mellitus with diabetic neuropathy, unspecified: Secondary | ICD-10-CM | POA: Diagnosis not present

## 2021-12-19 IMAGING — DX DG FOOT 2V*R*
2 series · 2 of 2 positions shown · non-contrast
Comparison: 10/24/2018

CLINICAL DATA: Post right foot surgery.

EXAM:
RIGHT FOOT - 2 VIEW

[foot ap]
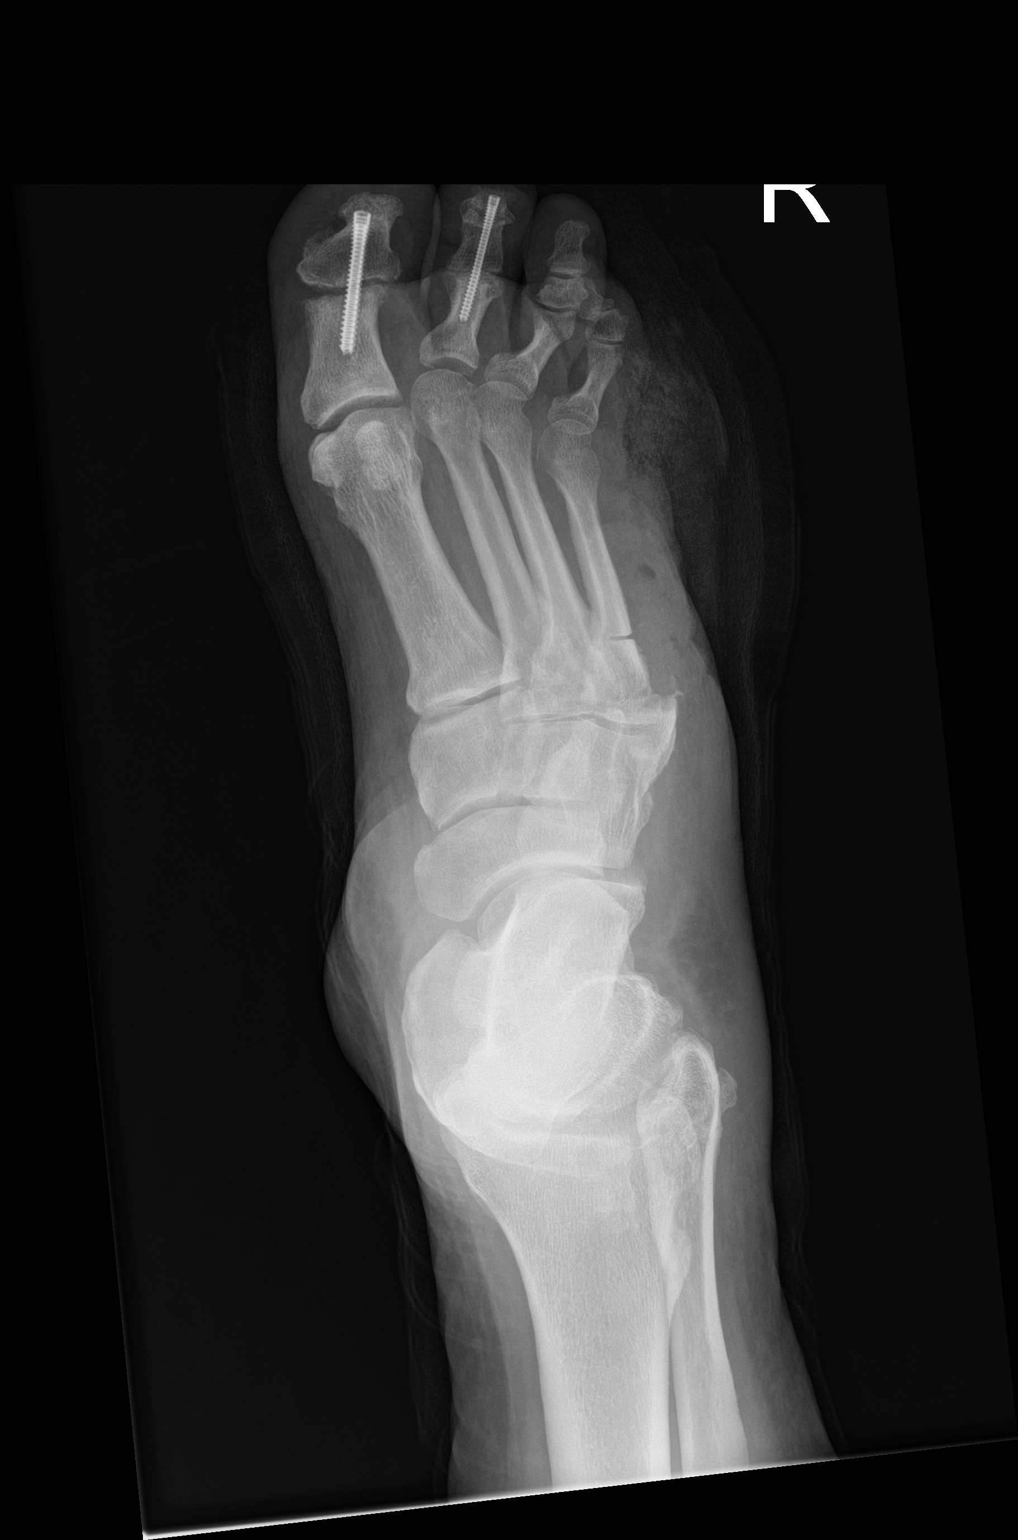

[foot lat]
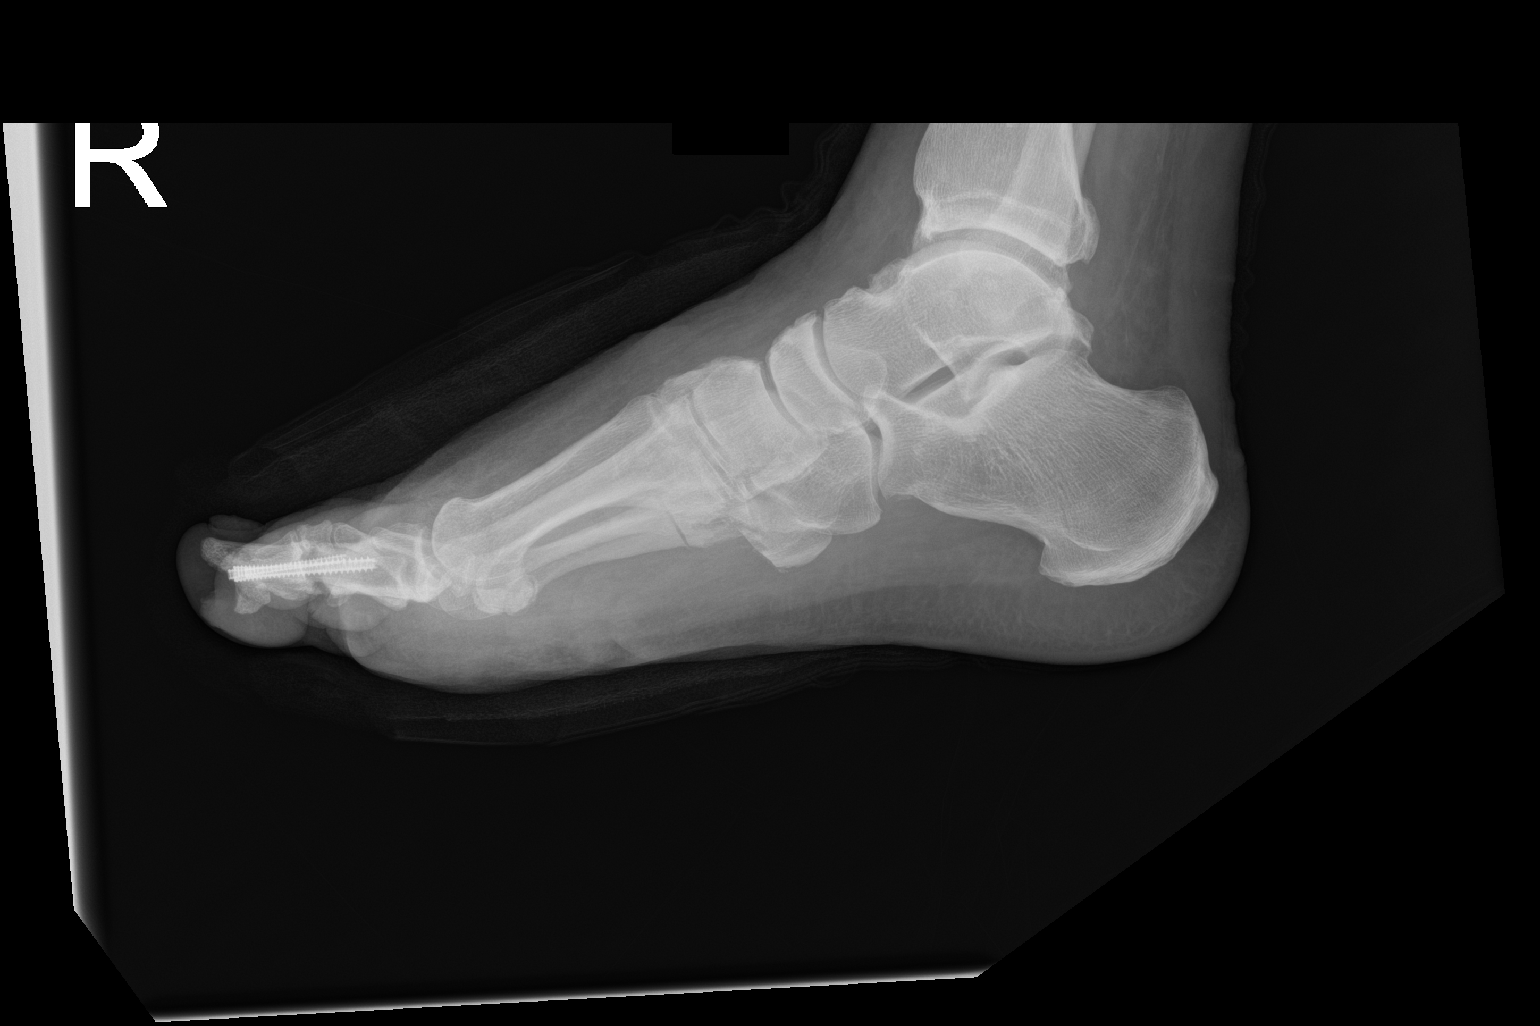

[2 of 2 positions shown; findings below may reference images not displayed]

FINDINGS: Evidence of patient's previous ankylosis over the interphalangeal
joints of the first and second toes unchanged. Evidence of recent
fifth toe amputation including the metatarsal and phalanges. There
is a well-defined horizontal lucency over the base of the fourth
metatarsal likely postsurgical. Postsurgical soft tissue changes
over the lateral mid to forefoot. Remainder the exam is unchanged.
IMPRESSION: 1. Postsurgical changes compatible recent fifth toe amputation.
Smooth well-defined horizontal lucency over the base of the fourth
metatarsal also likely postsurgical.

2.  Stable postsurgical changes over the first and second phalanges.

## 2021-12-25 ENCOUNTER — Ambulatory Visit (INDEPENDENT_AMBULATORY_CARE_PROVIDER_SITE_OTHER): Payer: BC Managed Care – PPO | Admitting: Family Medicine

## 2021-12-25 DIAGNOSIS — M79671 Pain in right foot: Secondary | ICD-10-CM | POA: Diagnosis not present

## 2021-12-25 DIAGNOSIS — E119 Type 2 diabetes mellitus without complications: Secondary | ICD-10-CM | POA: Diagnosis not present

## 2021-12-25 DIAGNOSIS — Z89421 Acquired absence of other right toe(s): Secondary | ICD-10-CM | POA: Diagnosis not present

## 2021-12-25 DIAGNOSIS — M6240 Contracture of muscle, unspecified site: Secondary | ICD-10-CM | POA: Diagnosis not present

## 2022-01-09 ENCOUNTER — Other Ambulatory Visit (HOSPITAL_COMMUNITY): Payer: Self-pay

## 2022-01-09 MED ORDER — MOUNJARO 15 MG/0.5ML ~~LOC~~ SOAJ
15.0000 mg | SUBCUTANEOUS | 3 refills | Status: DC
  Filled 2022-01-09: qty 2, 28d supply, fill #0
  Filled 2022-02-02: qty 2, 28d supply, fill #1
  Filled 2022-03-02: qty 2, 28d supply, fill #2
  Filled 2022-03-30 – 2022-04-12 (×2): qty 2, 28d supply, fill #3

## 2022-02-07 ENCOUNTER — Other Ambulatory Visit (HOSPITAL_COMMUNITY): Payer: Self-pay

## 2022-02-23 DIAGNOSIS — N1831 Chronic kidney disease, stage 3a: Secondary | ICD-10-CM | POA: Diagnosis not present

## 2022-02-23 DIAGNOSIS — H35033 Hypertensive retinopathy, bilateral: Secondary | ICD-10-CM | POA: Diagnosis not present

## 2022-02-23 DIAGNOSIS — E785 Hyperlipidemia, unspecified: Secondary | ICD-10-CM | POA: Diagnosis not present

## 2022-02-23 DIAGNOSIS — E669 Obesity, unspecified: Secondary | ICD-10-CM | POA: Diagnosis not present

## 2022-02-23 DIAGNOSIS — E1169 Type 2 diabetes mellitus with other specified complication: Secondary | ICD-10-CM | POA: Diagnosis not present

## 2022-02-28 ENCOUNTER — Encounter (INDEPENDENT_AMBULATORY_CARE_PROVIDER_SITE_OTHER): Payer: BC Managed Care – PPO | Admitting: Ophthalmology

## 2022-03-03 ENCOUNTER — Other Ambulatory Visit (HOSPITAL_COMMUNITY): Payer: Self-pay

## 2022-03-03 DIAGNOSIS — G4733 Obstructive sleep apnea (adult) (pediatric): Secondary | ICD-10-CM | POA: Diagnosis not present

## 2022-03-05 ENCOUNTER — Other Ambulatory Visit (HOSPITAL_COMMUNITY): Payer: Self-pay

## 2022-03-22 ENCOUNTER — Ambulatory Visit
Admission: RE | Admit: 2022-03-22 | Discharge: 2022-03-22 | Disposition: A | Discharge status: Home / Self Care | Payer: BC Managed Care – PPO | Source: Ambulatory Visit | Attending: Physician Assistant | Admitting: Physician Assistant

## 2022-03-22 ENCOUNTER — Other Ambulatory Visit: Payer: Self-pay | Admitting: Physician Assistant

## 2022-03-22 DIAGNOSIS — M79672 Pain in left foot: Secondary | ICD-10-CM | POA: Diagnosis not present

## 2022-03-22 DIAGNOSIS — M19072 Primary osteoarthritis, left ankle and foot: Secondary | ICD-10-CM | POA: Diagnosis not present

## 2022-03-22 DIAGNOSIS — M25572 Pain in left ankle and joints of left foot: Secondary | ICD-10-CM | POA: Diagnosis not present

## 2022-04-12 ENCOUNTER — Other Ambulatory Visit (HOSPITAL_COMMUNITY): Payer: Self-pay

## 2022-05-08 ENCOUNTER — Other Ambulatory Visit (HOSPITAL_COMMUNITY): Payer: Self-pay

## 2022-05-15 ENCOUNTER — Other Ambulatory Visit (HOSPITAL_COMMUNITY): Payer: Self-pay

## 2022-05-15 ENCOUNTER — Other Ambulatory Visit: Payer: Self-pay

## 2022-05-15 MED ORDER — MOUNJARO 15 MG/0.5ML ~~LOC~~ SOAJ
15.0000 mg | SUBCUTANEOUS | 3 refills | Status: DC
  Filled 2022-05-15: qty 2, 28d supply, fill #0
  Filled 2022-05-30: qty 2, 28d supply, fill #1
  Filled 2022-07-13: qty 2, 28d supply, fill #2
  Filled 2022-09-07: qty 2, 28d supply, fill #3

## 2022-05-29 ENCOUNTER — Other Ambulatory Visit: Payer: Self-pay | Admitting: Nurse Practitioner

## 2022-05-29 ENCOUNTER — Other Ambulatory Visit (HOSPITAL_COMMUNITY): Payer: Self-pay

## 2022-05-30 ENCOUNTER — Other Ambulatory Visit (HOSPITAL_COMMUNITY): Payer: Self-pay

## 2022-05-31 ENCOUNTER — Other Ambulatory Visit (HOSPITAL_COMMUNITY): Payer: Self-pay

## 2022-06-26 ENCOUNTER — Other Ambulatory Visit: Payer: Self-pay | Admitting: Nurse Practitioner

## 2022-07-03 DIAGNOSIS — E1169 Type 2 diabetes mellitus with other specified complication: Secondary | ICD-10-CM | POA: Diagnosis not present

## 2022-07-03 DIAGNOSIS — H35033 Hypertensive retinopathy, bilateral: Secondary | ICD-10-CM | POA: Diagnosis not present

## 2022-07-03 DIAGNOSIS — E7800 Pure hypercholesterolemia, unspecified: Secondary | ICD-10-CM | POA: Diagnosis not present

## 2022-07-03 DIAGNOSIS — M109 Gout, unspecified: Secondary | ICD-10-CM | POA: Diagnosis not present

## 2022-07-03 DIAGNOSIS — Z0000 Encounter for general adult medical examination without abnormal findings: Secondary | ICD-10-CM | POA: Diagnosis not present

## 2022-07-03 DIAGNOSIS — E559 Vitamin D deficiency, unspecified: Secondary | ICD-10-CM | POA: Diagnosis not present

## 2022-07-03 DIAGNOSIS — Z125 Encounter for screening for malignant neoplasm of prostate: Secondary | ICD-10-CM | POA: Diagnosis not present

## 2022-07-13 ENCOUNTER — Telehealth: Payer: Self-pay | Admitting: Internal Medicine

## 2022-07-13 MED ORDER — ATORVASTATIN CALCIUM 10 MG PO TABS: 10.0000 mg | ORAL_TABLET | Freq: Every day | ORAL | 0 refills | Status: DC

## 2022-07-13 NOTE — Telephone Encounter (Signed)
Pt's medication was sent to pt's pharmacy as requested. Confirmation received.  °

## 2022-07-13 NOTE — Telephone Encounter (Signed)
*  STAT* If patient is at the pharmacy, call can be transferred to refill team.   1. Which medications need to be refilled? (please list name of each medication and dose if known)  atorvastatin (LIPITOR) 10 MG tablet    2. Which pharmacy/location (including street and city if local pharmacy) is medication to be sent to?[id number] - [place], [place] - [address]   3. Do they need a 30 day or 90 day supply? 90 day

## 2022-07-18 ENCOUNTER — Ambulatory Visit: Payer: BC Managed Care – PPO | Attending: Internal Medicine | Admitting: Internal Medicine

## 2022-07-18 ENCOUNTER — Encounter: Payer: Self-pay | Admitting: Internal Medicine

## 2022-07-18 VITALS — BP 122/80 | HR 68 | Ht 71.0 in | Wt 283.8 lb

## 2022-07-18 DIAGNOSIS — Z7984 Long term (current) use of oral hypoglycemic drugs: Secondary | ICD-10-CM

## 2022-07-18 DIAGNOSIS — I152 Hypertension secondary to endocrine disorders: Secondary | ICD-10-CM

## 2022-07-18 DIAGNOSIS — E1159 Type 2 diabetes mellitus with other circulatory complications: Secondary | ICD-10-CM

## 2022-07-18 DIAGNOSIS — I25118 Atherosclerotic heart disease of native coronary artery with other forms of angina pectoris: Secondary | ICD-10-CM | POA: Diagnosis not present

## 2022-07-18 DIAGNOSIS — G4733 Obstructive sleep apnea (adult) (pediatric): Secondary | ICD-10-CM

## 2022-07-18 DIAGNOSIS — I10 Essential (primary) hypertension: Secondary | ICD-10-CM | POA: Diagnosis not present

## 2022-07-18 DIAGNOSIS — E785 Hyperlipidemia, unspecified: Secondary | ICD-10-CM | POA: Diagnosis not present

## 2022-07-18 DIAGNOSIS — I739 Peripheral vascular disease, unspecified: Secondary | ICD-10-CM | POA: Diagnosis not present

## 2022-07-18 DIAGNOSIS — E1169 Type 2 diabetes mellitus with other specified complication: Secondary | ICD-10-CM

## 2022-07-18 MED ORDER — LOSARTAN POTASSIUM 50 MG PO TABS
50.0000 mg | ORAL_TABLET | Freq: Every day | ORAL | 3 refills | Status: DC
Start: 2022-07-18 — End: 2022-09-27

## 2022-07-18 MED ORDER — AMLODIPINE BESYLATE 5 MG PO TABS: 5.0000 mg | ORAL_TABLET | Freq: Every day | ORAL | 3 refills | Status: DC

## 2022-07-18 MED ORDER — ATORVASTATIN CALCIUM 10 MG PO TABS: 10.0000 mg | ORAL_TABLET | Freq: Every day | ORAL | 0 refills | Status: DC

## 2022-07-18 NOTE — Addendum Note (Signed)
Addended by: Lendon Ka on: 07/18/2022 05:06 PM   Modules accepted: Orders

## 2022-07-18 NOTE — Patient Instructions (Signed)
Medication Instructions:  No changes *If you need a refill on your cardiac medications before your next appointment, please call your pharmacy*   Lab Work: Please return in 6 months for fasting blood work (lipids, liver function, Lp(a)  If you have labs (blood work) drawn today and your tests are completely normal, you will receive your results only by: MyChart Message (if you have MyChart) OR A paper copy in the mail If you have any lab test that is abnormal or we need to change your treatment, we will call you to review the results.   Testing/Procedures: none   Follow-Up: At [facility], you and your health needs are our priority.  As part of our continuing mission to provide you with exceptional heart care, we have created designated Provider Care Teams.  These Care Teams include your primary Cardiologist (physician) and Advanced Practice Providers (APPs -  Physician Assistants and Nurse Practitioners) who all work together to provide you with the care you need, when you need it.   Your next appointment:   12 month(s)  Provider:   Christell Constant, MD   or Eligha Bridegroom, NP

## 2022-07-18 NOTE — Progress Notes (Signed)
Cardiology Office Note:    Date:  07/18/2022   ID:  [name], DOB 09/25/1960, MRN [id number]  PCP:  James Aspen, MD   [facility] Providers Cardiologist:  James Constant, MD     Referring MD: [name], *   CC: Re-establish care  History of Present Illness:    [name] is a 62 y.o. male with a hx of DM with retinopathy and prior DKE, Morbid Obesity, HTN, HLD, and OSA on CPAP.  Phx of CAD with 2012 STEMI (indigestion was anginal equivalent); with PCI 04/2010. 2021: Has shoulder surgery without issue 2023: Had ABI's and wanted second opinion for PAD  Patient notes that he is doing great; no issues with shoulder.   Since last visit notes that his weight is net down since starting GLP-1. Lately weight has increased.again. There are no interval hospital/ED visit.    No chest pain or pressure .  No SOB/DOE and no PND/Orthopnea.  No leg swelling.  No palpitations or syncope .   Past Medical History:  Diagnosis Date   ADD (attention deficit disorder)    Anginal pain (HCC) 2012   Arthritis, shoulder region    B12 deficiency    Back pain    Coronary artery disease    Diabetes (HCC)    Diabetic retinopathy (HCC)    GERD (gastroesophageal reflux disease)    H/O heart artery stent 04/2010   Hyperlipidemia    Hypertension    Joint pain    Knee pain    Neuropathy    NSTEMI (non-ST elevated myocardial infarction) (HCC)    Obesity    Osteoarthritis    Persistent cough    stopped for a year   Sleep apnea    Swallowing difficulty    Swelling of both lower extremities     Past Surgical History:  Procedure Laterality Date   ACHILLES TENDON REPAIR Right    AMPUTATION TOE Right 03/14/2019   Procedure: AMPUTATION TOE, 5th toe;  Surgeon: [name], DPM;  Location: [facility] OR;  Service: Podiatry;  Laterality: Right;   heart stent  2012   x1   INCISION AND DRAINAGE Right 03/14/2019   Procedure: INCISION AND DRAINAGE;  Surgeon:  [name], DPM;  Location: [facility] OR;  Service: Podiatry;  Laterality: Right;   SHOULDER ARTHROSCOPY Right    TEE WITHOUT CARDIOVERSION N/A 03/17/2019   Procedure: TRANSESOPHAGEAL ECHOCARDIOGRAM (TEE);  Surgeon: Chilton Si, MD;  Location: [facility] ENDOSCOPY;  Service: Cardiovascular;  Laterality: N/A;   TOE SURGERY Right 06/2018   for a hammer toe   TOTAL SHOULDER ARTHROPLASTY Right 11/19/2019   Procedure: TOTAL SHOULDER ARTHROPLASTY;  Surgeon: [name], MD;  Location: [facility] ORS;  Service: Orthopedics;  Laterality: Right;   UVULOPALATOPHARYNGOPLASTY      Current Medications: Current Meds  Medication Sig   aspirin EC 81 MG tablet Take 81 mg by mouth daily.   atorvastatin (LIPITOR) 10 MG tablet Take 1 tablet (10 mg total) by mouth daily.   carvedilol (COREG) 6.25 MG tablet Take 6.25 mg by mouth 2 (two) times daily.   cetirizine (ZYRTEC) 10 MG tablet Take 10 mg by mouth daily.   Cholecalciferol (VITAMIN D) 50 MCG (2000 UT) tablet Take 4,000 Units by mouth daily.    glimepiride (AMARYL) 2 MG tablet TAKE 1 TABLET BY MOUTH IN THE MORNING AND AT BEDTIME   loratadine (CLARITIN) 10 MG tablet Take 10 mg by mouth daily.    Magnesium 400 MG CAPS  Take 400 mg by mouth daily.    metFORMIN (GLUCOPHAGE-XR) 500 MG 24 hr tablet Take 1,000 mg by mouth 2 (two) times daily.    pantoprazole (PROTONIX) 40 MG tablet TAKE 1 TABLET BY MOUTH TWICE DAILY (Patient taking differently: Take 40 mg by mouth 2 (two) times daily.)   Spacer/Aero-Holding Chambers (AEROCHAMBER MV) inhaler Use as instructed (Patient taking differently: as needed. Use as instructed for cough)   tirzepatide (MOUNJARO) 12.5 MG/0.5ML Pen Inject 12.5 mg into the skin once a week.   tirzepatide (MOUNJARO) 15 MG/0.5ML Pen Inject 15 mg into the skin once a week.   vitamin B-12 (CYANOCOBALAMIN) 500 MCG tablet Take 500 mcg by mouth daily.      Allergies:   Patient has no known allergies.   Social History   Socioeconomic History   Marital  status: Divorced    Spouse name: Not on file   Number of children: Not on file   Years of education: Not on file   Highest education level: Not on file  Occupational History   Occupation: unemployed  Tobacco Use   Smoking status: Never    Passive exposure: Yes   Smokeless tobacco: Never   Tobacco comments:    Both parents growing up.   Vaping Use   Vaping Use: Never used  Substance and Sexual Activity   Alcohol use: Yes    Comment: 4-5 on weekends   Drug use: No   Sexual activity: Not on file  Other Topics Concern   Not on file  Social History Narrative   Originally from [place], [place]. Previously has lived in [place] & [place]. 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 No pets currently. No bird or hot tub exposure. Could have mold in his current home and has lived there for 1.5 years now.    Social Determinants of Health   Financial Resource Strain: Not on file  Food Insecurity: Not on file  Transportation Needs: Not on file  Physical Activity: Not on file  Stress: Not on file  Social Connections: Not on file     Family History: The patient's family history includes Asthma in his maternal uncle; Breast cancer in his mother; Cancer in his mother and another family member; Diabetes in his father and another family member; Heart disease in his father and another family member; Leukemia in his brother; Stroke in his father.  ROS:   Please see the history of present illness.     All other systems reviewed and are negative.  EKGs/Labs/Other Studies Reviewed:    The following studies were reviewed today:   EKG:  EKG is  ordered today.  The ekg ordered today demonstrates  07/18/22: SR 82 Inferior infract  Cardiac Studies & Procedures       ECHOCARDIOGRAM  ECHOCARDIOGRAM COMPLETE 11/28/2015  Narrative *[facility]* [address] [place], [place]  [address] [phone]  ------------------------------------------------------------------- Transthoracic Echocardiography  Patient:    James, Ross MR #:       [id number] Study Date: 11/28/2015 Gender:     M Age:        55 Height:     181.6 cm Weight:     130.6 kg BSA:        2.62 m^2 Pt. Status: Room:  SONOGRAPHER  Junious Dresser, RDCS PERFORMING   [facility], Outpatient ATTENDING    Bjorn Loser E REFERRING  Lawanda Cousins E  cc:  ------------------------------------------------------------------- LV EF: 55% -   60%  ------------------------------------------------------------------- Indications:      (R06.00). DEFINITY.  ------------------------------------------------------------------- History:   PMH:  Acquired from the patient and from the patient&'s chart.  Cough and bilateral lower extremity edema.  Risk factors: Hypertension. Diabetes mellitus. Morbidly obese. Dyslipidemia.  ------------------------------------------------------------------- Study Conclusions  - Left ventricle: The cavity size was normal. There was mild focal basal hypertrophy of the septum. Systolic function was normal. The estimated ejection fraction was in the range of 55% to 60%. Wall motion was normal; there were no regional wall motion abnormalities. Doppler parameters are consistent with abnormal left ventricular relaxation (grade 1 diastolic dysfunction).  Impressions:  - Technically difficult; definity used; normal LV systolic function; grade 1 diastolic dysfunction.  ------------------------------------------------------------------- Labs, prior tests, procedures, and surgery: Catheterization.    The study demonstrated coronary artery disease. There was a stenosis which was treated with a stent.  ------------------------------------------------------------------- Study data:  No prior study was available for comparison.  Study status:   Routine.  Procedure:  The patient reported no pain pre or post test. Transthoracic echocardiography for diagnosis, for left ventricular function evaluation, for right ventricular function evaluation, and for assessment of valvular function. Image quality was adequate. The study was technically difficult, as a result of poor sound wave transmission and body habitus. Intravenous contrast (Definity) was administered to opacify the LV.  Study completion: There were no complications.          Transthoracic echocardiography.  M-mode, complete 2D, spectral Doppler, and color Doppler.  Birthdate:  Patient birthdate: 07-07-1960.  Age:  Patient is 62 yr old.  Sex:  Gender: male.    BMI: 39.6 kg/m^2.  Blood pressure:     137/84  Patient status:  Outpatient.  Study date: Study date: 11/28/2015. Study time: 07:26 AM.  Location:  [facility]  -------------------------------------------------------------------  ------------------------------------------------------------------- Left ventricle:  The cavity size was normal. There was mild focal basal hypertrophy of the septum. Systolic function was normal. The estimated ejection fraction was in the range of 55% to 60%. Wall motion was normal; there were no regional wall motion abnormalities. Doppler parameters are consistent with abnormal left ventricular relaxation (grade 1 diastolic dysfunction).  ------------------------------------------------------------------- Aortic valve:   Trileaflet; mildly calcified leaflets. Mobility was not restricted.  Doppler:  Transvalvular velocity was within the normal range. There was no stenosis. There was no regurgitation.  ------------------------------------------------------------------- Aorta:  Aortic root: The aortic root was normal in size.  ------------------------------------------------------------------- Mitral valve:   Structurally normal valve.   Mobility was not restricted.  Doppler:   Transvalvular velocity was within the normal range. There was no evidence for stenosis. There was no regurgitation.    Peak gradient (D): 2 mm Hg.  ------------------------------------------------------------------- Left atrium:  The atrium was normal in size.  ------------------------------------------------------------------- Right ventricle:  The cavity size was normal. Systolic function was normal.  ------------------------------------------------------------------- Pulmonic valve:    Doppler:  Transvalvular velocity was within the normal range. There was no evidence for stenosis.  ------------------------------------------------------------------- Tricuspid valve:   Structurally normal valve.    Doppler: Transvalvular velocity was within the normal range. There was no regurgitation.  ------------------------------------------------------------------- Right atrium:  The atrium was normal in size.  ------------------------------------------------------------------- Pericardium:  There was no pericardial effusion.  ------------------------------------------------------------------- Systemic veins: Inferior vena cava: The vessel was normal in size.  ------------------------------------------------------------------- Measurements  Left ventricle  Value        Reference LV ID, ED, PLAX chordal        (L)       38.6  mm     43 - 52 LV ID, ES, PLAX chordal                  27.9  mm     23 - 38 LV fx shortening, PLAX chordal (L)       28    %      >=29 LV PW thickness, ED                      9.21  mm     --------- IVS/LV PW ratio, ED            (H)       1.5          <=1.3 Stroke volume, 2D                        110   ml     --------- Stroke volume/bsa, 2D                    42    ml/m^2 --------- LV e&', lateral                           10.2  cm/s   --------- LV E/e&', lateral                         7.21         --------- LV e&', medial                             6.69  cm/s   --------- LV E/e&', medial                          10.99        --------- LV e&', average                           8.45  cm/s   --------- LV E/e&', average                         8.7          ---------  Ventricular septum                       Value        Reference IVS thickness, ED                        13.8  mm     ---------  LVOT                                     Value        Reference LVOT ID, S                               24    mm     --------- LVOT  area                                4.52  cm^2   --------- LVOT ID                                  24    mm     --------- LVOT peak velocity, S                    116   cm/s   --------- LVOT mean velocity, S                    75.6  cm/s   --------- LVOT VTI, S                              24.4  cm     --------- LVOT peak gradient, S                    5     mm Hg  --------- Stroke volume (SV), LVOT DP              110.4 ml     --------- Stroke index (SV/bsa), LVOT DP           42.1  ml/m^2 ---------  Aorta                                    Value        Reference Aortic root ID, ED                       30    mm     ---------  Left atrium                              Value        Reference LA ID, A-P, ES                           41    mm     --------- LA ID/bsa, A-P                           1.56  cm/m^2 <=2.2 LA volume, S                             31    ml     --------- LA volume/bsa, S                         11.8  ml/m^2 --------- LA volume, ES, 1-p A4C                   38    ml     --------- LA volume/bsa, ES, 1-p A4C               14.5  ml/m^2 --------- LA volume, ES, 1-p A2C  24    ml     --------- LA volume/bsa, ES, 1-p A2C               9.2   ml/m^2 ---------  Mitral valve                             Value        Reference Mitral E-wave peak velocity              73.5  cm/s   --------- Mitral A-wave peak velocity              66.6  cm/s   --------- Mitral  deceleration time                 225   ms     150 - 230 Mitral peak gradient, D                  2     mm Hg  --------- Mitral E/A ratio, peak                   1.1          ---------  Right ventricle                          Value        Reference RV s&', lateral, S                        11.4  cm/s   ---------  Legend: (L)  and  (H)  mark values outside specified reference range.  ------------------------------------------------------------------- Prepared and Electronically Authenticated by  Olga Millers 2017-10-16T09:58:56   TEE  ECHO TEE 03/17/2019  Narrative TRANSESOPHOGEAL ECHO REPORT    Patient Name:   GRISELDA NOVA Sardina Date of Exam: 03/17/2019 Medical Rec #:  [id number]       Height:       71.0 in Accession #:    [id number]      Weight:       326.7 lb Date of Birth:  1960-12-24       BSA:          2.60 m Patient Age:    58 years        BP:           145/71 mmHg Patient Gender: M               HR:           73 bpm. Exam Location:  Inpatient   Procedure: Transesophageal Echo, Color Doppler and Cardiac Doppler  Indications:     Bacteremia  History:         Patient has prior history of Echocardiogram examinations, most recent 11/28/2015.  Sonographer:     Irving Burton Senior RDCS Referring Phys:  [id number] Berton Bon Diagnosing Phys: Chilton Si MD    PROCEDURE: Normal Transesophogeal exam. No source of intracardiac mass. No evidence of intracardiac thrombus. No evidence of vegetation. Consent was requested emergently by emergency room physicain. After discussion of the risks and benefits of a TEE, an informed consent was obtained from the patient. Local oropharyngeal anesthetic was provided with viscous lidocaine. Patients was monitored while under deep sedation. The transesophogeal probe was passed through the esophogus of the patient. Imaged were obtained with the patient in a left lateral decubitus  position. Image quality was excellent. The patient's vital signs;  including heart rate, blood pressure, and oxygen saturation; remained stable throughout the procedure. The patient developed no complications during the procedure.  IMPRESSIONS   1. Left ventricular ejection fraction, by visual estimation, is 60 to 65%. The left ventricle has normal function. Left ventricular septal wall thickness was normal. Normal left ventricular posterior wall thickness. There is no left ventricular hypertrophy. 2. The left ventricle has no regional wall motion abnormalities. 3. Global right ventricle has normal systolic function.The right ventricular size is normal. No increase in right ventricular wall thickness. 4. Left atrial size was normal. 5. Right atrial size was normal. 6. The mitral valve is normal in structure. Mild mitral valve regurgitation. No evidence of mitral stenosis. 7. No trisuspid valve vegetation visualized. 8. The tricuspid valve is normal in structure. 9. The tricuspid valve is normal in structure. Tricuspid valve regurgitation is not demonstrated. 10. The aortic valve is normal in structure. Aortic valve regurgitation is not visualized. No evidence of aortic valve sclerosis or stenosis. 11. The pulmonic valve was normal in structure. Pulmonic valve regurgitation is not visualized. 12. The inferior vena cava is normal in size with greater than 50% respiratory variability, suggesting right atrial pressure of 3 mmHg.  FINDINGS Left Ventricle: Left ventricular ejection fraction, by visual estimation, is 60 to 65%. The left ventricle has normal function. The left ventricle has no regional wall motion abnormalities. Left ventricular septal wall thickness was normal. Normal left ventricular posterior wall thickness. There is no left ventricular hypertrophy.  Right Ventricle: The right ventricular size is normal. No increase in right ventricular wall thickness. Global RV systolic function is has normal systolic function.  Left Atrium: Left atrial size  was normal in size.  Right Atrium: Right atrial size was normal in size  Pericardium: There is no evidence of pericardial effusion.  Mitral Valve: The mitral valve is normal in structure. No evidence of mitral valve stenosis by observation. Mild mitral valve regurgitation.  Tricuspid Valve: The tricuspid valve is normal in structure. Tricuspid valve regurgitation is not demonstrated. No TV vegetation was visualized.  Aortic Valve: The aortic valve is normal in structure. Aortic valve regurgitation is not visualized. The aortic valve is structurally normal, with no evidence of sclerosis or stenosis.  Pulmonic Valve: The pulmonic valve was normal in structure. Pulmonic valve regurgitation is not visualized.  Aorta: The aortic root, ascending aorta and aortic arch are all structurally normal, with no evidence of dilitation or obstruction.  Venous: The inferior vena cava is normal in size with greater than 50% respiratory variability, suggesting right atrial pressure of 3 mmHg.  Shunts: There is no evidence of a patent foramen ovale. No ventricular septal defect is seen or detected. There is no evidence of an atrial septal defect. No atrial level shunt detected by color flow Doppler.   Chilton Si MD Electronically signed by Chilton Si MD Signature Date/Time: 03/17/2019/12:51:08 PM    Final             Recent Labs: 10/05/2021: ALT 13; BUN 22; Creatinine, Ser 1.56; Potassium 4.9; Sodium 134  Recent Lipid Panel    Component Value Date/Time   CHOL 151 10/31/2020 1034   TRIG 122 10/31/2020 1034   HDL 53 10/31/2020 1034   CHOLHDL 2.9 02/01/2020 0748   CHOLHDL 4.1 04/27/2010 0035   VLDL 29 04/27/2010 0035   LDLCALC 76 10/31/2020 1034    Physical Exam:    VS:  BP 122/80  Pulse 68   Ht 5\' 11"  (1.803 m)   Wt 283 lb 12.8 oz (128.7 kg)   SpO2 98%   BMI 39.58 kg/m     Wt Readings from Last 3 Encounters:  07/18/22 283 lb 12.8 oz (128.7 kg)  11/06/21 258 lb (117 kg)   10/05/21 256 lb (116.1 kg)     GEN: NAD, morbid obesity HEENT: Normal NECK: No JVD CARDIAC: RRR, no murmurs, rubs, gallops RESPIRATORY:  Clear to auscultation without rales, wheezing or rhonchi  ABDOMEN: Soft, non-tender, non-distended MUSCULOSKELETAL:  No edema; No deformity  SKIN: Warm and dry NEUROLOGIC:  Alert and oriented x 3 PSYCHIATRIC:  Normal affect   ASSESSMENT:    1. Coronary artery disease of native artery of native heart with stable angina pectoris (HCC)   2. Hyperlipidemia LDL goal <70   3. Essential hypertension   4. PAD (peripheral artery disease) (HCC)   5. Hyperlipidemia associated with type 2 diabetes mellitus (HCC)   6. Morbid obesity (HCC)   7. OSA (obstructive sleep apnea)     PLAN:     CAD HLD - continue ASA 81  - with prior dRCA PCI for STEMI - coreg tolerated and used for BP control - LDL above goal , we have discussed diet and exercise planning and if elevated above 70 will increase atorvastatin  - Lipids in six   HTN - continue norvasc 5 - he is not on clonidine; this is a PCP PRN - on losartan 50 mg   Morbid Obesity- On M[place]ro per primary OSA on CPAP - will return to [facility]  DM with PAD - he has lot a couple of toes that makes walking hard - he is seeing podiatry for this; prevention plan is as above  Exercise Prescription Frequency: three days per week to start  Intensity:  60 to 90 percent of heart rate reserve. Time:  10 minutes per session with 5 min increase per session Type: stationary cycling, swimming at [employer]; or [facility] Volume:  goal of 60 minute sessions Step goal:  7000 per week with goal to increase by 500 each weak Progression:  gradually increase the intensity or duration of exercise. Limitations: no   Diet Prescription Type: Mediterranean- we discussed this and Vegan given his CAD and did this due to patient preference  Calorie goal: 1800 cal plus cardio Weight loss  goal if applicable: 1 lbs a week Limitations: small kitchen, doesn't like avocado Meal plan: Created one day meal plan and gave online resources for weekly planning; patient is amenable to diet created   One year with me or Marcelino Duster S   Medication Adjustments/Labs and Tests Ordered: Current medicines are reviewed at length with the patient today.  Concerns regarding medicines are outlined above.  Orders Placed This Encounter  Procedures   Lipoprotein A (LPA)   Lipid panel   Hepatic function panel   EKG 12-Lead   No orders of the defined types were placed in this encounter.   Patient Instructions  Medication Instructions:  No changes *If you need a refill on your cardiac medications before your next appointment, please call your pharmacy*   Lab Work: Please return in 6 months for fasting blood work (lipids, liver function, Lp(a)  If you have labs (blood work) drawn today and your tests are completely normal, you will receive your results only by: MyChart Message (if you have MyChart) OR A paper copy in the mail If you have any  lab test that is abnormal or we need to change your treatment, we will call you to review the results.   Testing/Procedures: none   Follow-Up: At [facility], you and your health needs are our priority.  As part of our continuing mission to provide you with exceptional heart care, we have created designated Provider Care Teams.  These Care Teams include your primary Cardiologist (physician) and Advanced Practice Providers (APPs -  Physician Assistants and Nurse Practitioners) who all work together to provide you with the care you need, when you need it.   Your next appointment:   12 month(s)  Provider:   Christell Constant, MD   or Eligha Bridegroom, NP      Signed, James Constant, MD  07/18/2022 1:39 PM    [facility]

## 2022-07-20 ENCOUNTER — Other Ambulatory Visit (HOSPITAL_COMMUNITY): Payer: Self-pay

## 2022-08-08 DIAGNOSIS — M25372 Other instability, left ankle: Secondary | ICD-10-CM | POA: Diagnosis not present

## 2022-08-08 DIAGNOSIS — M14672 Charcot's joint, left ankle and foot: Secondary | ICD-10-CM | POA: Diagnosis not present

## 2022-08-08 DIAGNOSIS — M25572 Pain in left ankle and joints of left foot: Secondary | ICD-10-CM | POA: Diagnosis not present

## 2022-08-14 ENCOUNTER — Other Ambulatory Visit (HOSPITAL_COMMUNITY): Payer: Self-pay

## 2022-08-14 DIAGNOSIS — S86012A Strain of left Achilles tendon, initial encounter: Secondary | ICD-10-CM | POA: Diagnosis not present

## 2022-08-14 DIAGNOSIS — E113413 Type 2 diabetes mellitus with severe nonproliferative diabetic retinopathy with macular edema, bilateral: Secondary | ICD-10-CM | POA: Diagnosis not present

## 2022-08-14 DIAGNOSIS — M25572 Pain in left ankle and joints of left foot: Secondary | ICD-10-CM | POA: Diagnosis not present

## 2022-08-14 DIAGNOSIS — M19072 Primary osteoarthritis, left ankle and foot: Secondary | ICD-10-CM | POA: Diagnosis not present

## 2022-08-14 DIAGNOSIS — H2513 Age-related nuclear cataract, bilateral: Secondary | ICD-10-CM | POA: Diagnosis not present

## 2022-08-14 DIAGNOSIS — M65872 Other synovitis and tenosynovitis, left ankle and foot: Secondary | ICD-10-CM | POA: Diagnosis not present

## 2022-08-24 IMAGING — CR DG CHEST 2V
2 series · 2 of 2 positions shown · non-contrast
Comparison: 09/25/2019

CLINICAL DATA: Preop evaluation for upcoming shoulder surgery,
initial encounter

EXAM:
CHEST - 2 VIEW

[w chest pa]
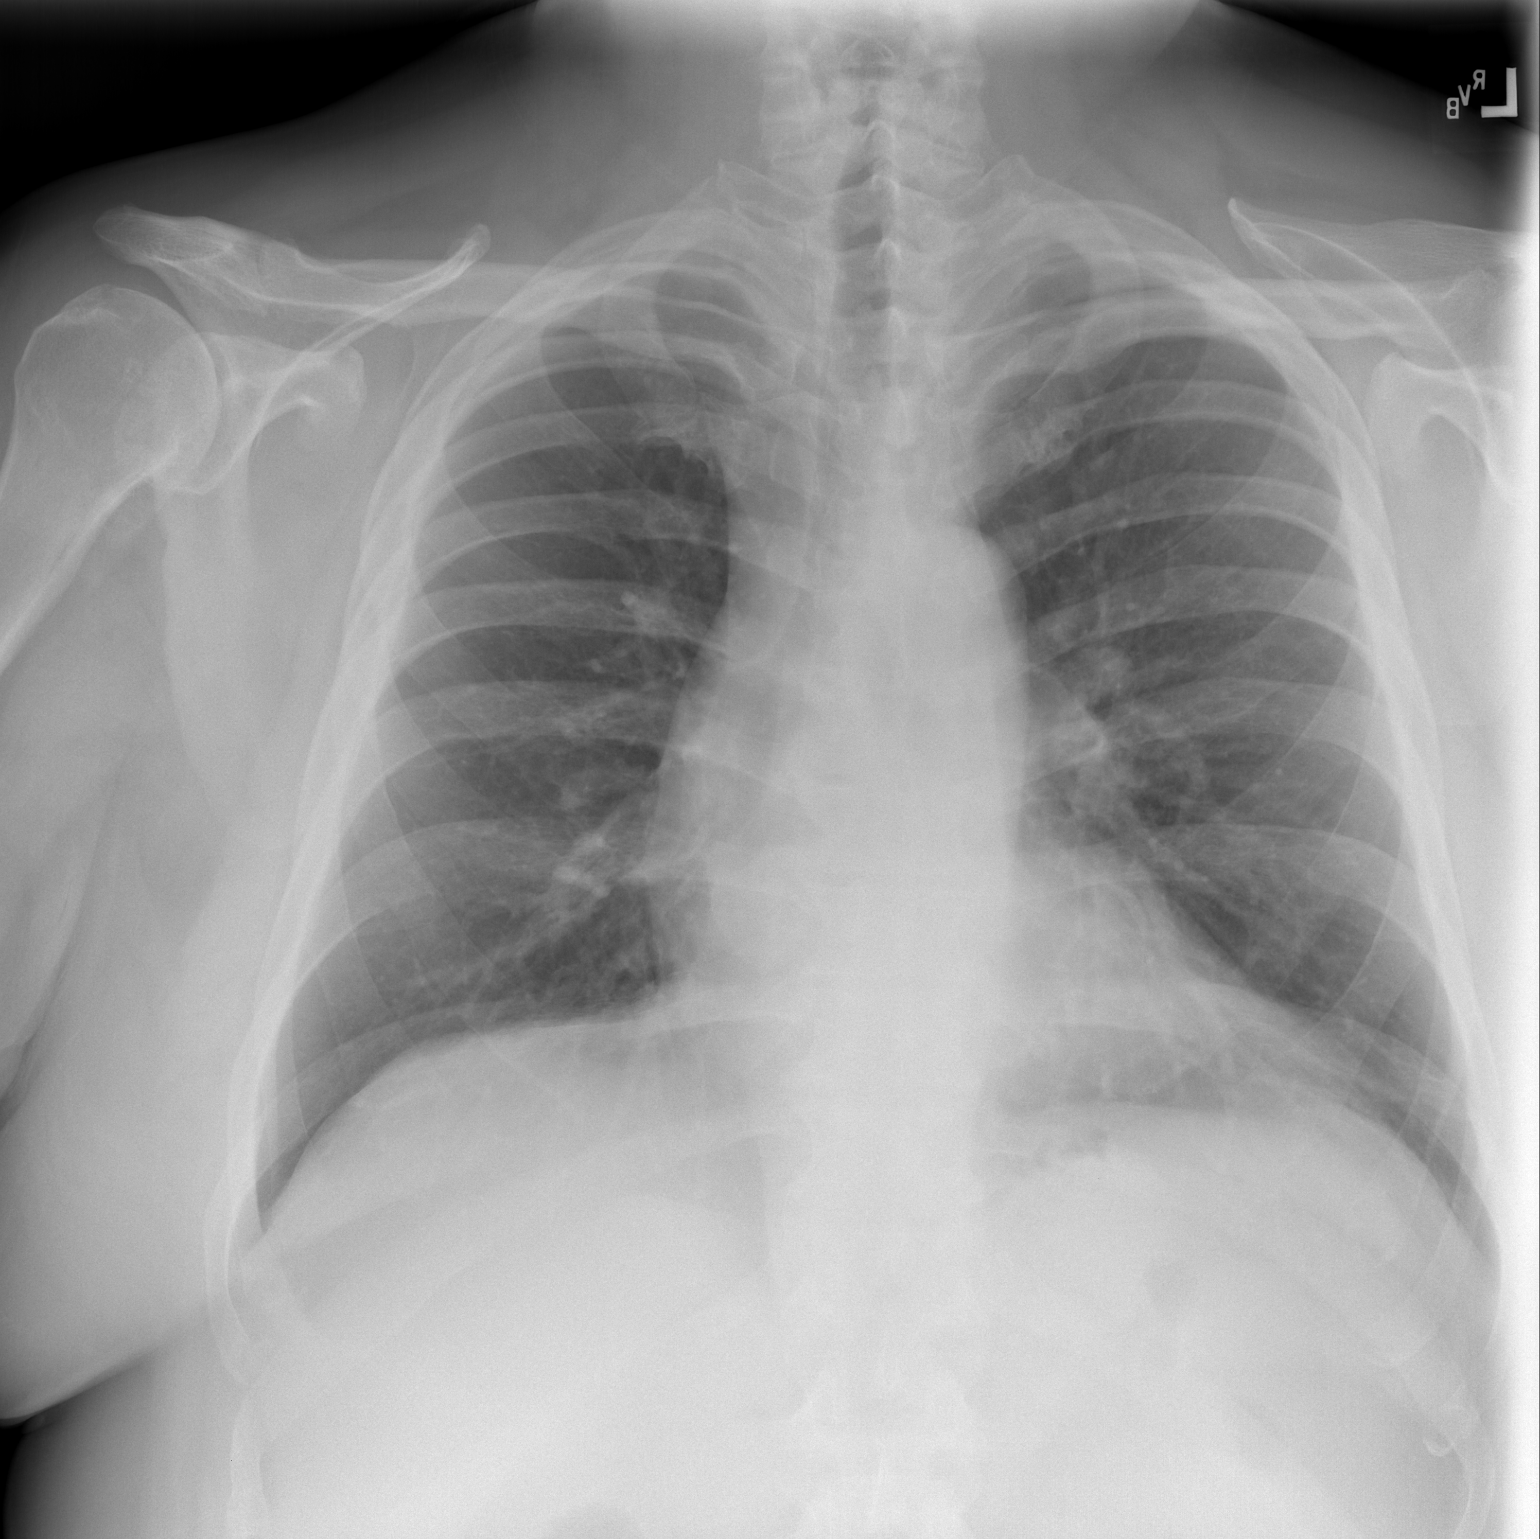

[w chest lat]
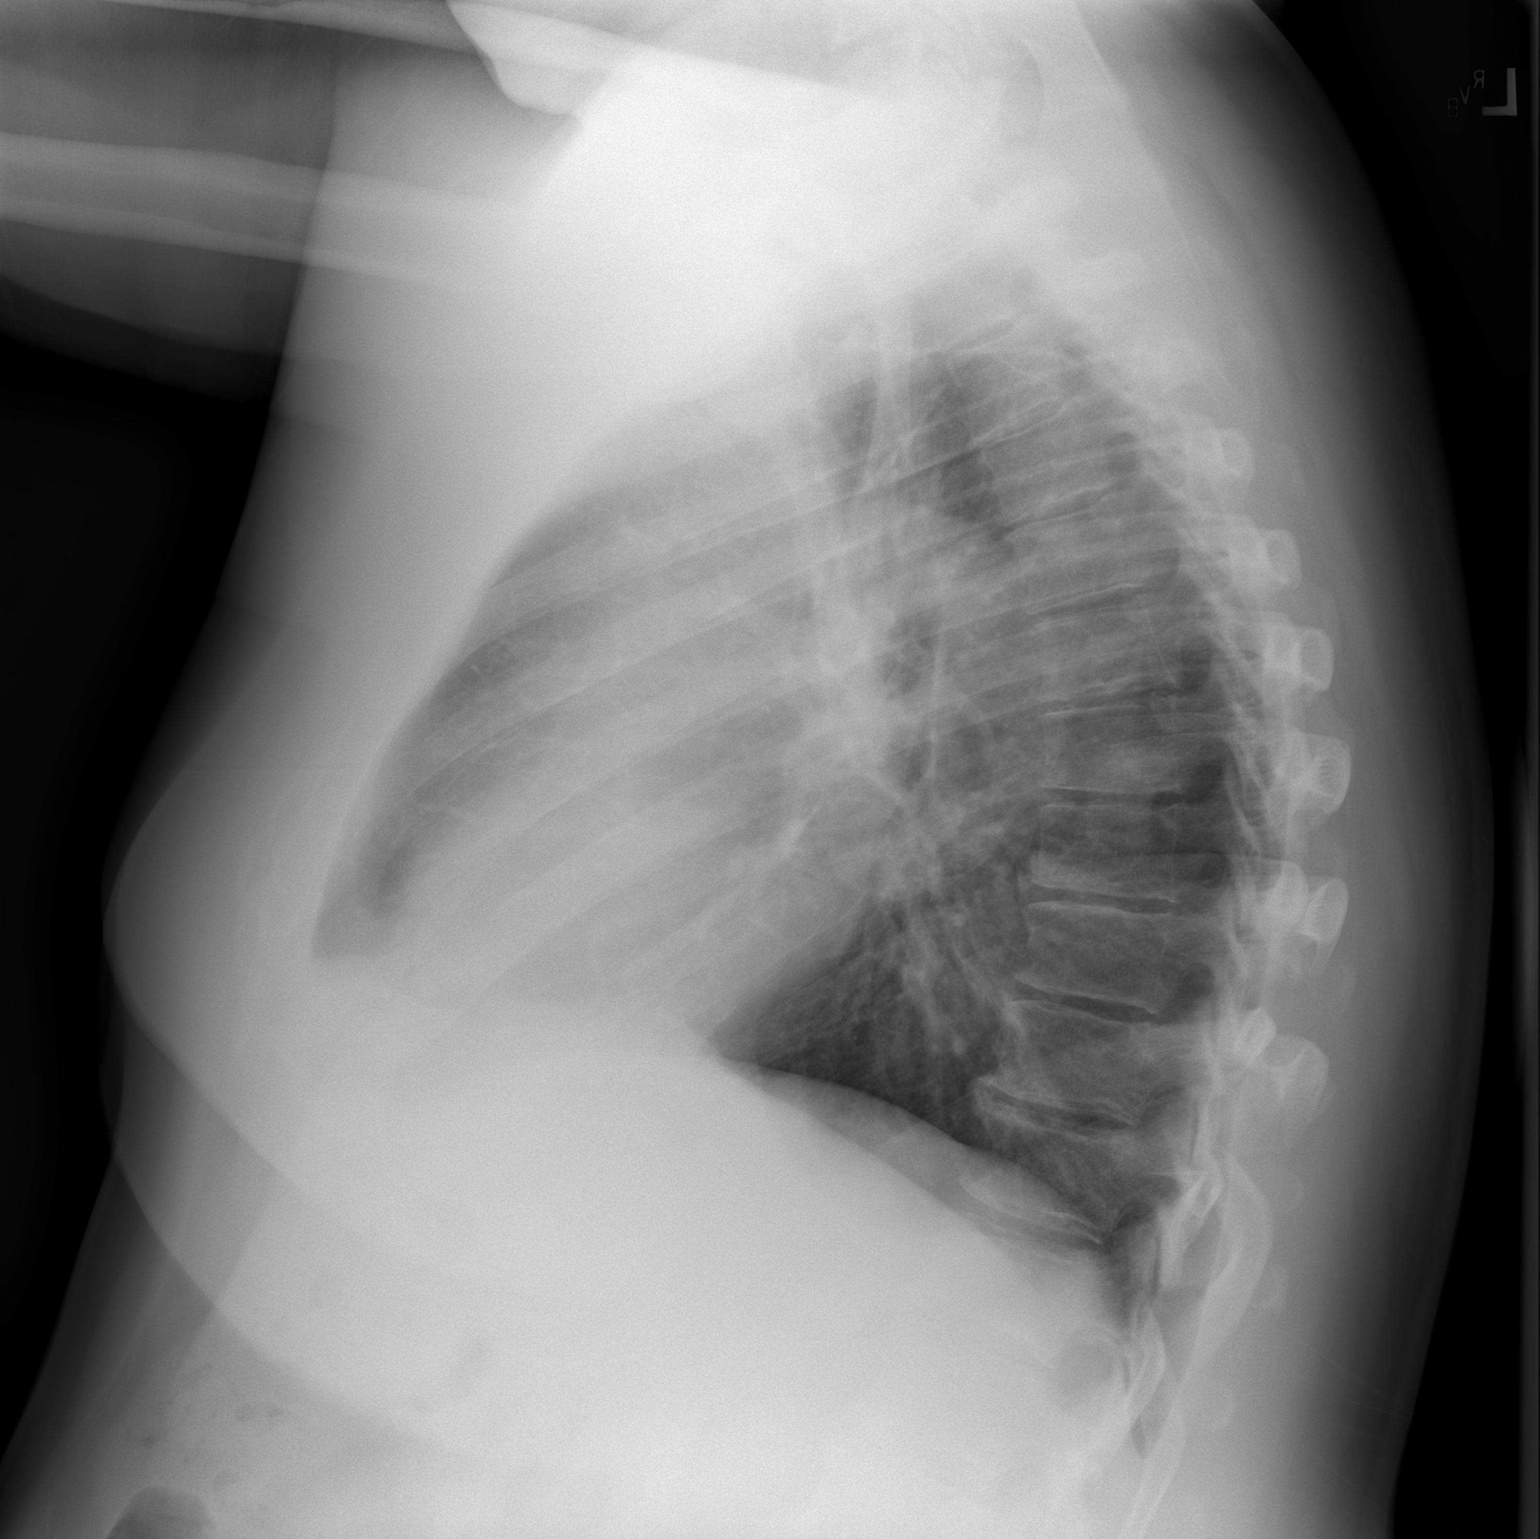

[2 of 2 positions shown; findings below may reference images not displayed]

FINDINGS: The heart size and mediastinal contours are within normal limits.
Both lungs are clear. The visualized skeletal structures are
unremarkable.
IMPRESSION: No active cardiopulmonary disease.

## 2022-08-26 IMAGING — DX DG SHOULDER 2+V PORT*R*
1 series · 1 of 1 positions shown · non-contrast
Comparison: Portable exam 4523 hours compared to CT exam of
12/12/2019

CLINICAL DATA: Post RIGHT shoulder surgery

EXAM:
PORTABLE RIGHT SHOULDER

[shoulder ap]
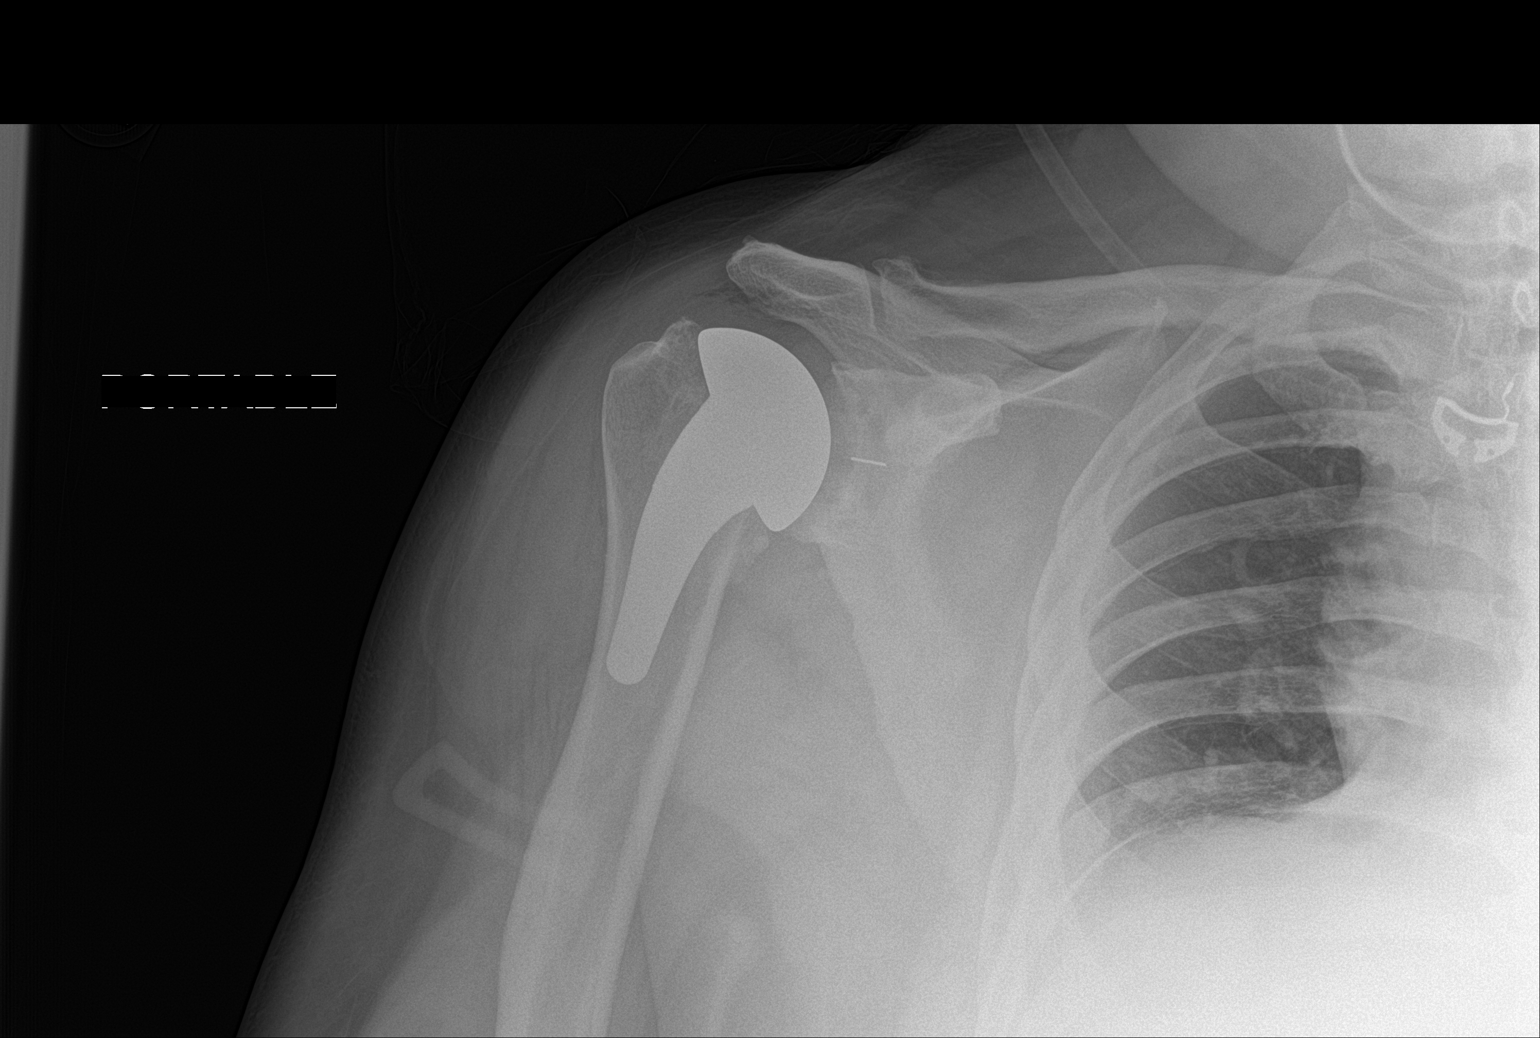

[1 of 1 positions shown; findings below may reference images not displayed]

FINDINGS: RIGHT shoulder prosthesis identified.

No fracture, dislocation, or bone destruction seen on single AP
view.

AC joint alignment normal.

Osseous mineralization normal.
IMPRESSION: RIGHT shoulder prosthesis without acute complication.

## 2022-08-29 DIAGNOSIS — R972 Elevated prostate specific antigen [PSA]: Secondary | ICD-10-CM | POA: Diagnosis not present

## 2022-09-07 ENCOUNTER — Other Ambulatory Visit (HOSPITAL_COMMUNITY): Payer: Self-pay

## 2022-09-12 ENCOUNTER — Other Ambulatory Visit (HOSPITAL_COMMUNITY): Payer: Self-pay

## 2022-09-12 DIAGNOSIS — G4733 Obstructive sleep apnea (adult) (pediatric): Secondary | ICD-10-CM | POA: Diagnosis not present

## 2022-09-14 ENCOUNTER — Ambulatory Visit: Payer: BC Managed Care – PPO | Admitting: Nurse Practitioner

## 2022-09-18 ENCOUNTER — Telehealth: Payer: Self-pay | Admitting: Internal Medicine

## 2022-09-18 MED ORDER — AMLODIPINE BESYLATE 10 MG PO TABS: 10.0000 mg | ORAL_TABLET | Freq: Every day | ORAL | 3 refills | Status: DC

## 2022-09-18 NOTE — Telephone Encounter (Signed)
Pt c/o medication issue:  1. Name of Medication:   amLODipine (NORVASC) 5 MG tablet    2. How are you currently taking this medication (dosage and times per day)? As written  3. Are you having a reaction (difficulty breathing--STAT)? No   4. What is your medication issue? Pt wants to know if this med can be increased back up to 10mg . Please advise  Last three days 143/80 hr 64 151/86 hr 73 149/86 hr 72

## 2022-09-18 NOTE — Telephone Encounter (Signed)
Called pt in regards to BP readings.  Reports has been taking amlodipine 10 mg up until 7/31 when he ran out of pills.  Started 5 mg dose and has noticed BP has charted.   Prior to change reports BP around 126/87 takes BP every morning.  Reports poor diet but no changes from previous poor diet.  Denies HA and SOB.  Advised will send to MD to advise on increase to amlodipine 10 mg.

## 2022-09-18 NOTE — Telephone Encounter (Signed)
Called pt advised of MD response:  Sounds like BP was better controlled on norvasc 10 mg.  Can return.    Advised to monitor BP readings and send in a log in 2 weeks. Pt expresses understanding.

## 2022-09-20 DIAGNOSIS — G4733 Obstructive sleep apnea (adult) (pediatric): Secondary | ICD-10-CM | POA: Diagnosis not present

## 2022-09-27 ENCOUNTER — Telehealth: Payer: Self-pay | Admitting: Internal Medicine

## 2022-09-27 DIAGNOSIS — I10 Essential (primary) hypertension: Secondary | ICD-10-CM

## 2022-09-27 MED ORDER — LOSARTAN POTASSIUM 50 MG PO TABS: 50.0000 mg | ORAL_TABLET | Freq: Two times a day (BID) | ORAL | 3 refills | Status: DC

## 2022-09-27 NOTE — Telephone Encounter (Signed)
Called pt in regards to BP readings.  Reports taking amlodipine 10 mg and losartan 50 mg every day.  Was taking losartan 50 mg PO BID until last refill when was reduced to daily. Reports SBP with BID dosing was 120's.  Pt is concerned about elevated BP d/t disease processes such Diabetic retinopathy.   Advised will send to MD to advise.

## 2022-09-27 NOTE — Telephone Encounter (Signed)
Patient stated he had a medication change and is calling to report BP readings as requested.  138/85  hr 70 (8/9) 137/88  hr 70 (8/10) 144/82  hr 77 (8/11) 141/78  hr 78 (8/12) 141/84  hr 71 (8/13) 153/90  hr 66 (8/14) 146/88  hr 70 (8/15)  Patient stated his amlodipine was decreased from 10 mg to 5 mg.  Patient noted he increased amlodipine back to 10 mg.  Patient stated losartan was reduced to 1 tablet once daily.  Patient wants to know next steps.

## 2022-09-27 NOTE — Telephone Encounter (Signed)
Reasonable trial of losartan 50 mg PO BID( 100 mg total dose; needs BMP in 1-2 weeks f/u.   Thanks,  MAC   Called pt advised of the above.  Will have f/u labs on 10/11/22. Will send in BP log on date of lab appointment.

## 2022-10-11 ENCOUNTER — Ambulatory Visit: Payer: BC Managed Care – PPO | Attending: Internal Medicine

## 2022-10-11 DIAGNOSIS — I10 Essential (primary) hypertension: Secondary | ICD-10-CM

## 2022-10-12 LAB — BASIC METABOLIC PANEL
BUN/Creatinine Ratio: 15 (ref 10–24)
BUN: 25 mg/dL (ref 8–27)
CO2: 22 mmol/L (ref 20–29)
Calcium: 9.5 mg/dL (ref 8.6–10.2)
Chloride: 100 mmol/L (ref 96–106)
Creatinine, Ser: 1.67 mg/dL — ABNORMAL HIGH (ref 0.76–1.27)
Glucose: 182 mg/dL — ABNORMAL HIGH (ref 70–99)
Potassium: 4.8 mmol/L (ref 3.5–5.2)
Sodium: 137 mmol/L (ref 134–144)
eGFR: 47 mL/min/{1.73_m2} — ABNORMAL LOW (ref >59)

## 2022-10-24 ENCOUNTER — Other Ambulatory Visit (HOSPITAL_COMMUNITY): Payer: Self-pay

## 2022-10-24 MED ORDER — MOUNJARO 15 MG/0.5ML ~~LOC~~ SOAJ
15.0000 mg | SUBCUTANEOUS | 2 refills | Status: DC
  Filled 2022-10-24: qty 2, 28d supply, fill #0
  Filled 2022-11-29: qty 2, 28d supply, fill #1
  Filled 2022-12-26: qty 2, 28d supply, fill #2

## 2022-10-25 ENCOUNTER — Other Ambulatory Visit (HOSPITAL_COMMUNITY): Payer: Self-pay

## 2022-11-07 DIAGNOSIS — L0390 Cellulitis, unspecified: Secondary | ICD-10-CM | POA: Diagnosis not present

## 2022-11-07 DIAGNOSIS — L97519 Non-pressure chronic ulcer of other part of right foot with unspecified severity: Secondary | ICD-10-CM | POA: Diagnosis not present

## 2022-11-07 DIAGNOSIS — E11621 Type 2 diabetes mellitus with foot ulcer: Secondary | ICD-10-CM | POA: Diagnosis not present

## 2022-12-03 ENCOUNTER — Other Ambulatory Visit (HOSPITAL_COMMUNITY): Payer: Self-pay

## 2022-12-03 DIAGNOSIS — H35033 Hypertensive retinopathy, bilateral: Secondary | ICD-10-CM | POA: Diagnosis not present

## 2022-12-03 DIAGNOSIS — E113412 Type 2 diabetes mellitus with severe nonproliferative diabetic retinopathy with macular edema, left eye: Secondary | ICD-10-CM | POA: Diagnosis not present

## 2022-12-03 DIAGNOSIS — G4733 Obstructive sleep apnea (adult) (pediatric): Secondary | ICD-10-CM | POA: Diagnosis not present

## 2022-12-03 DIAGNOSIS — E113411 Type 2 diabetes mellitus with severe nonproliferative diabetic retinopathy with macular edema, right eye: Secondary | ICD-10-CM | POA: Diagnosis not present

## 2022-12-11 DIAGNOSIS — H35033 Hypertensive retinopathy, bilateral: Secondary | ICD-10-CM | POA: Diagnosis not present

## 2022-12-11 DIAGNOSIS — H2513 Age-related nuclear cataract, bilateral: Secondary | ICD-10-CM | POA: Diagnosis not present

## 2022-12-11 DIAGNOSIS — E113413 Type 2 diabetes mellitus with severe nonproliferative diabetic retinopathy with macular edema, bilateral: Secondary | ICD-10-CM | POA: Diagnosis not present

## 2022-12-26 DIAGNOSIS — E113511 Type 2 diabetes mellitus with proliferative diabetic retinopathy with macular edema, right eye: Secondary | ICD-10-CM | POA: Diagnosis not present

## 2022-12-26 DIAGNOSIS — E113412 Type 2 diabetes mellitus with severe nonproliferative diabetic retinopathy with macular edema, left eye: Secondary | ICD-10-CM | POA: Diagnosis not present

## 2022-12-28 ENCOUNTER — Other Ambulatory Visit (HOSPITAL_COMMUNITY): Payer: Self-pay

## 2023-01-02 ENCOUNTER — Other Ambulatory Visit: Payer: Self-pay

## 2023-01-02 DIAGNOSIS — E785 Hyperlipidemia, unspecified: Secondary | ICD-10-CM

## 2023-01-07 DIAGNOSIS — N1831 Chronic kidney disease, stage 3a: Secondary | ICD-10-CM | POA: Diagnosis not present

## 2023-01-07 DIAGNOSIS — E113413 Type 2 diabetes mellitus with severe nonproliferative diabetic retinopathy with macular edema, bilateral: Secondary | ICD-10-CM | POA: Diagnosis not present

## 2023-01-07 DIAGNOSIS — I2510 Atherosclerotic heart disease of native coronary artery without angina pectoris: Secondary | ICD-10-CM | POA: Diagnosis not present

## 2023-01-08 DIAGNOSIS — E113511 Type 2 diabetes mellitus with proliferative diabetic retinopathy with macular edema, right eye: Secondary | ICD-10-CM | POA: Diagnosis not present

## 2023-01-08 DIAGNOSIS — E113412 Type 2 diabetes mellitus with severe nonproliferative diabetic retinopathy with macular edema, left eye: Secondary | ICD-10-CM | POA: Diagnosis not present

## 2023-01-17 ENCOUNTER — Ambulatory Visit: Payer: BC Managed Care – PPO | Attending: Internal Medicine

## 2023-01-17 DIAGNOSIS — E785 Hyperlipidemia, unspecified: Secondary | ICD-10-CM | POA: Diagnosis not present

## 2023-01-17 DIAGNOSIS — M25372 Other instability, left ankle: Secondary | ICD-10-CM | POA: Diagnosis not present

## 2023-01-18 LAB — HEPATIC FUNCTION PANEL
ALT: 14 [IU]/L (ref 0–44)
AST: 15 [IU]/L (ref 0–40)
Albumin: 4.3 g/dL (ref 3.8–4.9)
Alkaline Phosphatase: 98 [IU]/L (ref 44–121)
Bilirubin Total: 0.6 mg/dL (ref 0.0–1.2)
Bilirubin, Direct: 0.19 mg/dL (ref 0.00–0.40)
Total Protein: 6.6 g/dL (ref 6.0–8.5)

## 2023-01-18 LAB — LIPID PANEL
Chol/HDL Ratio: 3.3 {ratio} (ref 0.0–5.0)
Cholesterol, Total: 147 mg/dL (ref 100–199)
HDL: 45 mg/dL (ref >39)
LDL Chol Calc (NIH): 87 mg/dL (ref 0–99)
Triglycerides: 75 mg/dL (ref 0–149)
VLDL Cholesterol Cal: 15 mg/dL (ref 5–40)

## 2023-01-18 LAB — LIPOPROTEIN A (LPA): Lipoprotein (a): 9.7 nmol/L (ref <75.0)

## 2023-01-21 ENCOUNTER — Other Ambulatory Visit: Payer: Self-pay

## 2023-01-21 ENCOUNTER — Telehealth: Payer: Self-pay

## 2023-01-21 DIAGNOSIS — E785 Hyperlipidemia, unspecified: Secondary | ICD-10-CM

## 2023-01-21 MED ORDER — ATORVASTATIN CALCIUM 20 MG PO TABS: 20.0000 mg | ORAL_TABLET | Freq: Every day | ORAL | 3 refills | Status: DC

## 2023-01-21 NOTE — Telephone Encounter (Signed)
The patient has been notified of the result and verbalized understanding.  All questions (if any) were answered. Macie Burows, RN 01/21/2023 11:17 AM   Orders placed and released for draw.  Advised pt can go to any [facility] in 3 months for fasting blood work.  Reports will go to [facility] in [place].

## 2023-01-21 NOTE — Telephone Encounter (Signed)
-----   Message from Christell Constant sent at 01/19/2023 10:23 AM EST ----- Testing Results WNL save for LDL given hx of CAD Offer atorvastatin 20 mg and labs

## 2023-01-29 ENCOUNTER — Other Ambulatory Visit: Payer: Self-pay | Admitting: Internal Medicine

## 2023-02-11 ENCOUNTER — Other Ambulatory Visit (HOSPITAL_COMMUNITY): Payer: Self-pay

## 2023-02-12 ENCOUNTER — Other Ambulatory Visit (HOSPITAL_COMMUNITY): Payer: Self-pay

## 2023-02-12 MED ORDER — MOUNJARO 15 MG/0.5ML ~~LOC~~ SOAJ
15.0000 mg | SUBCUTANEOUS | 2 refills | Status: DC
  Filled 2023-02-12 – 2023-03-07 (×2): qty 2, 28d supply, fill #0
  Filled 2023-05-29: qty 2, 28d supply, fill #1
  Filled 2023-07-20: qty 2, 28d supply, fill #2

## 2023-02-12 MED ORDER — MOUNJARO 15 MG/0.5ML ~~LOC~~ SOAJ
15.0000 mg | SUBCUTANEOUS | 2 refills | Status: DC
  Filled 2023-02-12: qty 2, 28d supply, fill #0
  Filled 2023-04-04: qty 2, 28d supply, fill #1
  Filled 2023-05-02: qty 2, 28d supply, fill #2

## 2023-02-20 DIAGNOSIS — E113511 Type 2 diabetes mellitus with proliferative diabetic retinopathy with macular edema, right eye: Secondary | ICD-10-CM | POA: Diagnosis not present

## 2023-02-20 DIAGNOSIS — H2513 Age-related nuclear cataract, bilateral: Secondary | ICD-10-CM | POA: Diagnosis not present

## 2023-02-20 DIAGNOSIS — E113412 Type 2 diabetes mellitus with severe nonproliferative diabetic retinopathy with macular edema, left eye: Secondary | ICD-10-CM | POA: Diagnosis not present

## 2023-02-20 DIAGNOSIS — H35033 Hypertensive retinopathy, bilateral: Secondary | ICD-10-CM | POA: Diagnosis not present

## 2023-02-20 DIAGNOSIS — E113411 Type 2 diabetes mellitus with severe nonproliferative diabetic retinopathy with macular edema, right eye: Secondary | ICD-10-CM | POA: Diagnosis not present

## 2023-02-20 DIAGNOSIS — G4733 Obstructive sleep apnea (adult) (pediatric): Secondary | ICD-10-CM | POA: Diagnosis not present

## 2023-03-07 ENCOUNTER — Other Ambulatory Visit: Payer: Self-pay

## 2023-04-17 DIAGNOSIS — H2513 Age-related nuclear cataract, bilateral: Secondary | ICD-10-CM | POA: Diagnosis not present

## 2023-04-17 DIAGNOSIS — E103511 Type 1 diabetes mellitus with proliferative diabetic retinopathy with macular edema, right eye: Secondary | ICD-10-CM | POA: Diagnosis not present

## 2023-04-17 DIAGNOSIS — H43823 Vitreomacular adhesion, bilateral: Secondary | ICD-10-CM | POA: Diagnosis not present

## 2023-04-17 DIAGNOSIS — H35033 Hypertensive retinopathy, bilateral: Secondary | ICD-10-CM | POA: Diagnosis not present

## 2023-04-17 DIAGNOSIS — H1045 Other chronic allergic conjunctivitis: Secondary | ICD-10-CM | POA: Diagnosis not present

## 2023-04-19 DIAGNOSIS — H35033 Hypertensive retinopathy, bilateral: Secondary | ICD-10-CM | POA: Diagnosis not present

## 2023-04-19 DIAGNOSIS — N1831 Chronic kidney disease, stage 3a: Secondary | ICD-10-CM | POA: Diagnosis not present

## 2023-04-19 DIAGNOSIS — E113413 Type 2 diabetes mellitus with severe nonproliferative diabetic retinopathy with macular edema, bilateral: Secondary | ICD-10-CM | POA: Diagnosis not present

## 2023-04-19 DIAGNOSIS — M109 Gout, unspecified: Secondary | ICD-10-CM | POA: Diagnosis not present

## 2023-04-22 ENCOUNTER — Encounter (INDEPENDENT_AMBULATORY_CARE_PROVIDER_SITE_OTHER): Payer: Self-pay

## 2023-04-22 DIAGNOSIS — Z0289 Encounter for other administrative examinations: Secondary | ICD-10-CM

## 2023-05-03 DIAGNOSIS — N1831 Chronic kidney disease, stage 3a: Secondary | ICD-10-CM | POA: Diagnosis not present

## 2023-05-03 DIAGNOSIS — E113413 Type 2 diabetes mellitus with severe nonproliferative diabetic retinopathy with macular edema, bilateral: Secondary | ICD-10-CM | POA: Diagnosis not present

## 2023-05-03 LAB — HEMOGLOBIN A1C: Hemoglobin A1C: 8.5

## 2023-05-21 ENCOUNTER — Ambulatory Visit (INDEPENDENT_AMBULATORY_CARE_PROVIDER_SITE_OTHER): Admitting: Family Medicine

## 2023-05-21 ENCOUNTER — Encounter (INDEPENDENT_AMBULATORY_CARE_PROVIDER_SITE_OTHER): Payer: Self-pay | Admitting: Family Medicine

## 2023-05-21 VITALS — BP 118/79 | HR 88 | Temp 97.9°F | Ht 70.0 in | Wt 273.0 lb

## 2023-05-21 DIAGNOSIS — R5383 Other fatigue: Secondary | ICD-10-CM | POA: Diagnosis not present

## 2023-05-21 DIAGNOSIS — E119 Type 2 diabetes mellitus without complications: Secondary | ICD-10-CM

## 2023-05-21 DIAGNOSIS — R0602 Shortness of breath: Secondary | ICD-10-CM | POA: Diagnosis not present

## 2023-05-21 DIAGNOSIS — R4589 Other symptoms and signs involving emotional state: Secondary | ICD-10-CM

## 2023-05-21 DIAGNOSIS — Z1331 Encounter for screening for depression: Secondary | ICD-10-CM

## 2023-05-21 DIAGNOSIS — G4733 Obstructive sleep apnea (adult) (pediatric): Secondary | ICD-10-CM

## 2023-05-21 DIAGNOSIS — E1169 Type 2 diabetes mellitus with other specified complication: Secondary | ICD-10-CM

## 2023-05-21 DIAGNOSIS — I152 Hypertension secondary to endocrine disorders: Secondary | ICD-10-CM

## 2023-05-21 DIAGNOSIS — Z6841 Body Mass Index (BMI) 40.0 and over, adult: Secondary | ICD-10-CM

## 2023-05-21 DIAGNOSIS — Z7984 Long term (current) use of oral hypoglycemic drugs: Secondary | ICD-10-CM

## 2023-05-21 DIAGNOSIS — E1159 Type 2 diabetes mellitus with other circulatory complications: Secondary | ICD-10-CM

## 2023-05-21 DIAGNOSIS — F5089 Other specified eating disorder: Secondary | ICD-10-CM | POA: Diagnosis not present

## 2023-05-21 DIAGNOSIS — I25118 Atherosclerotic heart disease of native coronary artery with other forms of angina pectoris: Secondary | ICD-10-CM

## 2023-05-21 DIAGNOSIS — E785 Hyperlipidemia, unspecified: Secondary | ICD-10-CM

## 2023-05-21 DIAGNOSIS — E66813 Obesity, class 3: Secondary | ICD-10-CM

## 2023-05-21 NOTE — Progress Notes (Signed)
 [name], D.O.  ABFM, ABOM Specializing in Clinical Bariatric Medicine Office located at: [address] [place], [place]  [address]   Bariatric Medicine Visit  Dear [name], [name], *   Thank you for referring [name] to our clinic today for evaluation.  We performed a consultation to discuss his options for treatment and educate the patient on his disease state.  The following note includes my evaluation and treatment recommendations.   Please do not hesitate to reach out to me directly if you have any further concerns.   Assessment and Plan:   Orders Placed This Encounter  Procedures   CBC with Differential/Platelet   Insulin, random   Lipid panel   TSH   VITAMIN D 25 Hydroxy (Vit-D Deficiency, Fractures)   Folate   Hepatic function panel   Vitamin B12   T4, free   EKG 12-Lead    FOR THE DISEASE OF OBESITY:  Obesity, Start BMI 39.17 Morbid obesity (HCC) Assessment & Plan: [name] is currently in the action stage of change. As such, his goal is to start our weight management plan.  He has agreed to: follow the Category 2 plan with Breakfast and Lunch options.   Behavioral Intervention We discussed the following today: Long-life meal prep, begin to work on maintaining a reduced calorie state, getting the recommended amount of protein, incorporating whole foods, making healthy choices, staying well hydrated and practicing mindfulness when eating.  Additional resources provided today: Handout on low-starchy vegetables, Handout on CAT 2 meal plan, Handout on CAT 1-2 breakfast options, and Handout on CAT 1-2 lunch options  Evidence-based interventions for health behavior change were utilized today including the discussion of self monitoring techniques, problem-solving barriers and SMART goal setting techniques.    Pt will specifically work on: n/a   Recommended Physical Activity Goals [name] has been advised to work up to 150 minutes of  moderate intensity aerobic activity a week and strengthening exercises 2-3 times per week for cardiovascular health, weight loss maintenance and preservation of muscle mass.   He has agreed to : maintain current level of activity.    Pharmacotherapy We both agreed to :  continue medication regimen   FOR ASSOCIATED CONDITIONS ADDRESSED TODAY:  Fatigue Assessment & Plan: [name] does feel that his weight is causing his energy to be lower than it should be. Fatigue may be related to obesity, depression or many other causes. he does not appear to have any red flag symptoms and this appears to most likely be related to his current lifestyle habits and dietary intake.  Labs will be ordered and reviewed with him at their next office visit in two weeks.  Epworth sleepiness scale is 6 and appears to be within normal limits. [name] admits to occasional daytime somnolence and wakes up refreshed. [name] generally gets 5 or 6 hours of sleep per night, and states that he has generally restful sleep. Snoring is present. Pt with h/o OSA on CPAP. Discussed proper sleep hygiene practices and encouraged pt to aim for 7-9 hrs of sleep per night for the best weight loss results and for his overall health/well being.    ECG: Performed and reviewed/ interpreted independently.  Normal sinus rhythm with evidence of inferior infarct in the past; rate 77 bpm; reassuring without any acute abnormalities; no acute changes. Will continue to monitor for symptoms    Shortness of breath on exertion Assessment & Plan: [name] does feel that he gets out of breath  more easily than he used to when he exercises and seems to be worsening over time with weight gain.  This has gotten worse recently. [name] denies shortness of breath at rest or orthopnea. Pt denies chest pain, dizziness, heart palpitations, or excessive diaphoresis or nausea with activity.  This is not new and is ongoing.  [name]'s shortness of breath appears to be  obesity related and exercise induced, as they do not appear to have any "red flag" symptoms/ concerns today.  Also, this condition appears to be related to a state of poor cardiovascular conditioning   Obtain labs today and will be reviewed with him at their next office visit in two weeks.  Indirect Calorimeter completed today to help guide our dietary regimen. It shows a VO2 of 296 and a REE of 2045.  His calculated basal metabolic rate is 4098 thus his resting energy expenditure is worse than expected.  Patient agreed to work on weight loss at this time.  As [name] progresses through our weight loss program, we will gradually increase exercise as tolerated to treat his current condition.   If [name] follows our recommendations and loses 5-10% of their weight without improvement of his shortness of breath or if at any time, symptoms become more concerning, they agree to urgently follow up with their PCP/ specialist for further consideration/ evaluation.   [name] verbalizes agreement with this plan.    Type 2 diabetes mellitus with obesity (HCC) Assessment & Plan: Had the following labs with [facility] on 05/03/2023: Hemoglobin A1c of 8.5, BMP showed serum creatinine of 1.31, BUN of 28, and Glucose of 233. At one point when pt was in our program, his A1c had improved from 8.8 to 5.4. He is on a regimen of Jardiance ( started 3 weeks ago), Mounjaro 15 mg weekly, Metformin, and Glimepiride. Denies symptoms of hypoglycemia or hyperglycemia. Continue all medications. Begin reduced calorie meal plan low on processed crabs and simple sugars. Weight loss will improve insulin resistance and glycemic control   Hypertension associated with type 2 diabetes mellitus (HCC) Assessment & Plan: Last 3 blood pressure readings in our office are as follows: BP Readings from Last 3 Encounters:  05/21/23 118/79  07/18/22 122/80  11/06/21 (!) 140/88   Blood pressure is well-controlled today on Norvasc, Coreg, and  Cozaar. Continue adherence to antihypertensive therapy and begin low sodium diet, advance exercise as able.    Hyperlipidemia associated with type 2 diabetes mellitus (HCC)/ Coronary artery disease of native artery of native heart with stable angina pectoris (HCC) - h/o acute myocardial infarction  Assessment & Plan: Lab Results  Component Value Date   CHOL 143 05/21/2023   HDL 47 05/21/2023   LDLCALC 78 05/21/2023   TRIG 97 05/21/2023   CHOLHDL 3.0 05/21/2023   Managed by Dr.Chandrasekhar of cardiology. Phx of CAD with 2012 STEMI. On Aspirin 81 mg daily and Lipitor 20 mg daily. Continue regimen and begin low cholesterol heart healthy meal plan.    OSA (obstructive sleep apnea) on CPAP Assessment & Plan: Per hx. On CPAP with reported good compliance. Continue CPAP therapy. Losing 10-15% or more of body weight may improve condition.    Depression screening Assessment & Plan: His Food and Mood (modified PHQ-9) score was positive at 14. He acknowledges eating when stressed, bored, and as a reward. We will monitor this closely and work on CBT to help improve the non-hunger eating patterns.    Depressed mood - emotional eating Assessment & Plan: *********States he  has been battling with depression for years; endorses that his depression is related to a combination of factors including his weight, "how my life has turned out", and "the state of our country". He has had thoughts of harming himself and how he would specifically do it: however, he has never actively tried to harm himself. States that his suicidal ideations are sometimes "all consuming" and other times "pretty mild". Lately, the thoughts have been mild. He also has concerns about the stigma surrounding depression and the privacy of his medical information. He has seen different psychologists many years ago but did not find it helpful. He is not currently on medications, but has tried multiple in the past including Wellbutrin - he  did not find these medications effective.    Reassured patient that his medical information is protected and reviewed under HIPAA laws.   Extensive counseling performed about preconceived notions regarding depression and that depression is a disease that warrants the same attention and treatment as any other disease process.  Recommend pt to make his environment as safe as possible (e.g giving his pistol away). Discussed that engaging in exercise and following his prudent nutritional plan can improve mood as well as support emotional well-being.  Provided pt additional support materials of places he can call or go to when experiencing reoccurring suicidal ideations. Highly encouraged pt to explore mental health services through the [place]. Will place ambulatory referral to Psychiatry.   He has made a verbal commitment and handshake with me today and with my medical scribe, Special Allegra Arch, as my witness that he will not harm himself and will seek immediate help when these feelings of self-harm arise.    FOLLOW UP:   Follow up in 2 weeks. He was informed of the importance of frequent follow up visits to maximize his success with intensive lifestyle modifications for his multiple health conditions.  [name] is aware that we will review all of his lab results at our next visit.  He is aware that if anything is critical/ life threatening with the results, we will be contacting him via MyChart prior to the office visit to discuss management.    Chief Complaint:   OBESITY James C Firestine (MR# [id number]) is a pleasant 63 y.o. male who presents for evaluation and treatment of obesity and related comorbidities. Current BMI is Body mass index is 39.17 kg/m. [name] has been struggling with his weight for many years and has been unsuccessful in either losing weight, maintaining weight loss, or reaching his healthy weight goal.  [name] is currently in the action stage of change and ready  to dedicate time achieving and maintaining a healthier weight. [name] is interested in becoming our patient and working on intensive lifestyle modifications including (but not limited to) diet and exercise for weight loss.  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  Patient has a girlfriend. He does not have children. He lives alone.  Heaviest weight: 330 lbs.   Started gaining excessive weight "after my 30s"  Denies h/o smoking or drug use.   Does not engage in formal exercise.   Diets tried in past: [facility] (lost roughly 60 lbs, kept some of the weight off).   Eats outside the home 4-5 times a week.  Obstacle to cooking: "small kitchen"  Craves salty and sweet snacks at night.   Snacks on cookies, crackers, popcorns and cashews.  Denies skipping  meals.  Drinks a 16 ounce regular soda daily and 5-6 beers per week.   Worst food habit: "snacking & sugar cravings"   Subjective:   This is the patient's first visit at [facility].  The patient's NEW PATIENT PACKET that they filled out prior to today's office visit was reviewed at length and information from that paperwork was included within the following office visit note.    Included in the packet: current and past health history, medications, allergies, ROS, gynecologic history (women only), surgical history, family history, social history, weight history, weight loss surgery history (for those that have had weight loss surgery), nutritional evaluation, mood and food questionnaire along with a depression screening (PHQ9) on all patients, an Epworth questionnaire, sleep habits questionnaire, patient life and health improvement goals questionnaire. These will all be scanned into the patient's chart under the "media" tab.   Review of Systems: Please refer to new patient packet scanned into media. Pertinent positives were addressed with patient today.  Reviewed  by clinician on day of visit: allergies, medications, problem list, medical history, surgical history, family history, social history, and previous encounter notes.  During the visit, I independently reviewed the patient's EKG, bioimpedance scale results, and indirect calorimeter results. I used this information to tailor a meal plan for the patient that will help [name] to lose weight and will improve his obesity-related conditions going forward.  I performed a medically necessary appropriate examination and/or evaluation. I discussed the assessment and treatment plan with the patient. The patient was provided an opportunity to ask questions and all were answered. The patient agreed with the plan and demonstrated an understanding of the instructions. Labs were ordered today (unless patient declined them) and will be reviewed with the patient at our next visit unless more critical results need to be addressed immediately. Clinical information was updated and documented in the EMR.   Objective:   PHYSICAL EXAM: Blood pressure 118/79, pulse 88, temperature 97.9 F (36.6 C), height 5\' 10"  (1.778 m), weight 273 lb (123.8 kg), SpO2 97%. Body mass index is 39.17 kg/m.  General: Well Developed, well nourished, and in no acute distress.  HEENT: Normocephalic, atraumatic; EOMI, sclerae are anicteric. Skin: Warm and dry, good turgor Chest:  Normal excursion, shape, no gross ABN Respiratory: No conversational dyspnea; speaking in full sentences NeuroM-Sk:  Normal gross ROM * 4 extremities  Psych: A and O *3, insight adequate, mood- full   Anthropometric Measurements Height: 5\' 10"  (1.778 m) Weight: 273 lb (123.8 kg) BMI (Calculated): 39.17 Weight at Last Visit: N/A Weight Lost Since Last Visit: N/A Weight Gained Since Last Visit: N/A Starting Weight: 273.0lb Total Weight Loss (lbs): 0 lb (0 kg) Peak Weight: 330   Body Composition  Body Fat %: 40.1 % Fat Mass (lbs): 109.4 lbs Muscle  Mass (lbs): 155.6 lbs Total Body Water (lbs): 121.2 lbs Visceral Fat Rating : 25   Other Clinical Data Fasting: Yes Labs: Yes Today's Visit #: 1 Starting Date: 05/21/23   DIAGNOSTIC DATA REVIEWED:  BMET    Component Value Date/Time   NA 137 10/11/2022 0950   K 4.8 10/11/2022 0950   CL 100 10/11/2022 0950   CO2 22 10/11/2022 0950   GLUCOSE 182 (H) 10/11/2022 0950   GLUCOSE 156 (H) 11/17/2019 1440   BUN 25 10/11/2022 0950   CREATININE 1.67 (H) 10/11/2022 0950   CREATININE 1.30 05/18/2019 1122   CALCIUM 9.5 10/11/2022 0950   GFRNONAA 67 03/14/2020 1446  GFRNONAA 61 05/18/2019 1122   GFRAA 77 03/14/2020 1446   GFRAA 71 05/18/2019 1122   Lab Results  Component Value Date   HGBA1C 6.3 (H) 10/05/2021   HGBA1C (H) 04/26/2010    11.9 (NOTE)                                                                       According to the ADA Clinical Practice Recommendations for 2011, when HbA1c is used as a screening test:   >=6.5%   Diagnostic of Diabetes Mellitus           (if abnormal result  is confirmed)  5.7-6.4%   Increased risk of developing Diabetes Mellitus  References:Diagnosis and Classification of Diabetes Mellitus,Diabetes Care,2011,34(Suppl 1):S62-S69 and Standards of Medical Care in         Diabetes - 2011,Diabetes Care,2011,34  (Suppl 1):S11-S61.   Lab Results  Component Value Date   INSULIN 9.5 10/31/2020   INSULIN 28.9 (H) 09/01/2019   Lab Results  Component Value Date   TSH 4.170 10/31/2020   CBC    Component Value Date/Time   WBC 7.9 10/31/2020 1034   WBC 7.3 11/17/2019 1440   RBC 4.93 10/31/2020 1034   RBC 4.73 11/17/2019 1440   HGB 15.2 10/31/2020 1034   HCT 44.8 10/31/2020 1034   PLT 285 10/31/2020 1034   MCV 91 10/31/2020 1034   MCH 30.8 10/31/2020 1034   MCH 30.0 11/17/2019 1440   MCHC 33.9 10/31/2020 1034   MCHC 33.6 11/17/2019 1440   RDW 12.5 10/31/2020 1034   Iron Studies No results found for: "IRON", "TIBC", "FERRITIN",  "IRONPCTSAT" Lipid Panel     Component Value Date/Time   CHOL 147 01/17/2023 0910   TRIG 75 01/17/2023 0910   HDL 45 01/17/2023 0910   CHOLHDL 3.3 01/17/2023 0910   CHOLHDL 4.1 04/27/2010 0035   VLDL 29 04/27/2010 0035   LDLCALC 87 01/17/2023 0910   Hepatic Function Panel     Component Value Date/Time   PROT 6.6 01/17/2023 0910   ALBUMIN 4.3 01/17/2023 0910   AST 15 01/17/2023 0910   ALT 14 01/17/2023 0910   ALKPHOS 98 01/17/2023 0910   BILITOT 0.6 01/17/2023 0910   BILIDIR 0.19 01/17/2023 0910      Component Value Date/Time   TSH 4.170 10/31/2020 1034   Nutritional Lab Results  Component Value Date   VD25OH 73.1 10/05/2021   VD25OH 83.5 02/22/2021   VD25OH 84.9 10/31/2020    Attestation Statements:   I, Special Puri, acting as a Stage manager for Marceil Sensor, DO., have compiled all relevant documentation for today's office visit on behalf of Marceil Sensor, DO, while in the presence of [employer], DO.  Reviewed by clinician on day of visit: allergies, medications, problem list, medical history, surgical history, family history, social history, and previous encounter notes pertinent to patient's obesity diagnosis.  *********I have spent 85 minutes in the care of the patient today. 10-15 minutes was spent reviewing his new patient packet. I then reviewed his past medical history and recent labs with  [facility] & EMR which included any new medicines that were started since last seeing him. Over 50% of the direct face to face time was spent in  counseling the patient on his history of suicidal ideations and  discussing resources for urgent care, making a verbal pact that he won't harm himself, and convincing him depression requires treatment just like any other medical disease. Pt had many concerns over medical record privacy and us  documenting his concerns today. We counseled pt and reassured him that his medical information is protected and reviewed under HIPAA  laws. Discussed ways he can improve his mood through exercise, eating healthy, and self-care habits in addition to seeing a psychiatrist. This was all in the addition to the above disease management and counseling.   I have reviewed the above documentation for accuracy and completeness, and I agree with the above. [name], D.O.  The 21st Century Cures Act was signed into law in 2016 which includes the topic of electronic health records.  This provides immediate access to information in MyChart.  This includes consultation notes, operative notes, office notes, lab results and pathology reports.  If you have any questions about what you read please let us  know at your next visit so we can discuss your concerns and take corrective action if need be.  We are right here with you.

## 2023-05-22 DIAGNOSIS — E1140 Type 2 diabetes mellitus with diabetic neuropathy, unspecified: Secondary | ICD-10-CM | POA: Diagnosis not present

## 2023-05-22 DIAGNOSIS — M25371 Other instability, right ankle: Secondary | ICD-10-CM | POA: Diagnosis not present

## 2023-05-22 DIAGNOSIS — M19071 Primary osteoarthritis, right ankle and foot: Secondary | ICD-10-CM | POA: Diagnosis not present

## 2023-05-22 DIAGNOSIS — R2681 Unsteadiness on feet: Secondary | ICD-10-CM | POA: Diagnosis not present

## 2023-05-23 LAB — CBC WITH DIFFERENTIAL/PLATELET
Basophils Absolute: 0.1 10*3/uL (ref 0.0–0.2)
Basos: 1 %
EOS (ABSOLUTE): 0.2 10*3/uL (ref 0.0–0.4)
Eos: 2 %
Hematocrit: 44.2 % (ref 37.5–51.0)
Hemoglobin: 14.9 g/dL (ref 13.0–17.7)
Immature Grans (Abs): 0 10*3/uL (ref 0.0–0.1)
Immature Granulocytes: 0 %
Lymphocytes Absolute: 1.6 10*3/uL (ref 0.7–3.1)
Lymphs: 15 %
MCH: 30.3 pg (ref 26.6–33.0)
MCHC: 33.7 g/dL (ref 31.5–35.7)
MCV: 90 fL (ref 79–97)
Monocytes Absolute: 0.7 10*3/uL (ref 0.1–0.9)
Monocytes: 7 %
Neutrophils Absolute: 7.8 10*3/uL — ABNORMAL HIGH (ref 1.4–7.0)
Neutrophils: 75 %
Platelets: 355 10*3/uL (ref 150–450)
RBC: 4.92 x10E6/uL (ref 4.14–5.80)
RDW: 12.4 % (ref 11.6–15.4)
WBC: 10.4 10*3/uL (ref 3.4–10.8)

## 2023-05-23 LAB — HEPATIC FUNCTION PANEL
ALT: 15 IU/L (ref 0–44)
AST: 13 IU/L (ref 0–40)
Albumin: 4.6 g/dL (ref 3.8–4.9)
Alkaline Phosphatase: 104 IU/L (ref 44–121)
Bilirubin Total: 0.9 mg/dL (ref 0.0–1.2)
Bilirubin, Direct: 0.3 mg/dL (ref 0.00–0.40)
Total Protein: 7.2 g/dL (ref 6.0–8.5)

## 2023-05-23 LAB — LIPID PANEL
Chol/HDL Ratio: 3 ratio (ref 0.0–5.0)
Cholesterol, Total: 143 mg/dL (ref 100–199)
HDL: 47 mg/dL (ref >39)
LDL Chol Calc (NIH): 78 mg/dL (ref 0–99)
Triglycerides: 97 mg/dL (ref 0–149)
VLDL Cholesterol Cal: 18 mg/dL (ref 5–40)

## 2023-05-23 LAB — TSH: TSH: 2.91 u[IU]/mL (ref 0.450–4.500)

## 2023-05-23 LAB — FOLATE: Folate: 5.5 ng/mL (ref >3.0)

## 2023-05-23 LAB — INSULIN, RANDOM: INSULIN: 12.4 u[IU]/mL (ref 2.6–24.9)

## 2023-05-23 LAB — VITAMIN D 25 HYDROXY (VIT D DEFICIENCY, FRACTURES): Vit D, 25-Hydroxy: 39.3 ng/mL (ref 30.0–100.0)

## 2023-05-23 LAB — T4, FREE: Free T4: 1.55 ng/dL (ref 0.82–1.77)

## 2023-05-23 LAB — VITAMIN B12: Vitamin B-12: 948 pg/mL (ref 232–1245)

## 2023-06-01 ENCOUNTER — Other Ambulatory Visit (HOSPITAL_COMMUNITY): Payer: Self-pay

## 2023-06-04 ENCOUNTER — Telehealth (INDEPENDENT_AMBULATORY_CARE_PROVIDER_SITE_OTHER): Payer: Self-pay | Admitting: *Deleted

## 2023-06-04 ENCOUNTER — Ambulatory Visit (INDEPENDENT_AMBULATORY_CARE_PROVIDER_SITE_OTHER): Admitting: Family Medicine

## 2023-06-04 VITALS — BP 116/73 | HR 79 | Temp 97.9°F | Ht 70.0 in | Wt 271.0 lb

## 2023-06-04 DIAGNOSIS — E1169 Type 2 diabetes mellitus with other specified complication: Secondary | ICD-10-CM

## 2023-06-04 DIAGNOSIS — R4589 Other symptoms and signs involving emotional state: Secondary | ICD-10-CM

## 2023-06-04 DIAGNOSIS — F5089 Other specified eating disorder: Secondary | ICD-10-CM

## 2023-06-04 DIAGNOSIS — E559 Vitamin D deficiency, unspecified: Secondary | ICD-10-CM

## 2023-06-04 DIAGNOSIS — E785 Hyperlipidemia, unspecified: Secondary | ICD-10-CM

## 2023-06-04 DIAGNOSIS — Z6838 Body mass index (BMI) 38.0-38.9, adult: Secondary | ICD-10-CM

## 2023-06-04 DIAGNOSIS — Z7984 Long term (current) use of oral hypoglycemic drugs: Secondary | ICD-10-CM

## 2023-06-04 DIAGNOSIS — I25118 Atherosclerotic heart disease of native coronary artery with other forms of angina pectoris: Secondary | ICD-10-CM

## 2023-06-04 DIAGNOSIS — Z7985 Long-term (current) use of injectable non-insulin antidiabetic drugs: Secondary | ICD-10-CM

## 2023-06-04 MED ORDER — VITAMIN D (ERGOCALCIFEROL) 1.25 MG (50000 UNIT) PO CAPS: ORAL_CAPSULE | ORAL | 0 refills | Status: DC

## 2023-06-04 NOTE — Progress Notes (Signed)
 [name], D.O.  ABFM, ABOM Clinical Bariatric Medicine Physician  Office located at: [address] [place], [place]  [address]   Assessment and Plan:   Meds ordered this encounter  Medications   Vitamin D , Ergocalciferol , (DRISDOL ) 1.25 MG (50000 UNIT) CAPS capsule    Sig: 1 po q 2 wks    Dispense:  4 capsule    Refill:  0    FOR THE DISEASE OF OBESITY:  Obesity, Start BMI 39.17 Morbid obesity (HCC) Assessment & Plan: Since last office visit on 05/21/2023 patient's  Muscle mass has decreased by 1.8 lb. Fat mass has increased by 0.2 lb. Total body water  has increased by 3.2 lb.  Counseling done on how various foods will affect these numbers and how to maximize success  Total lbs lost to date: - 2 lbs  Total weight loss percentage to date: -0.73%    Recommended Dietary Goals [name] is currently in the action stage of change. As such, his goal is to continue weight management plan.  He has agreed to: continue current plan   Behavioral Intervention We discussed the following today: increasing lean protein intake to established goals, focusing on food with a 10:1 ratio of calories: grams of protein, and discussed pre-packaged healthier meals such as [name] All Natural, Whole 30 chicken meals, Longlife meal prep and Factor Meals etc  Additional resources provided today: Handout on recipe packet I and Handout on recipe packet II  Evidence-based interventions for health behavior change were utilized today including the discussion of self monitoring techniques, problem-solving barriers and SMART goal setting techniques.  Regarding patient's less desirable eating habits and patterns, we employed the technique of small changes.   Pt will specifically work on: increasing his lean protein intake   Recommended Physical Activity Goals [name] has been advised to work up to 150 minutes of moderate intensity aerobic activity a week and strengthening exercises 2-3 times per  week for cardiovascular health, weight loss maintenance and preservation of muscle mass.   He has agreed to : go to the gym 30 minutes, 3 days a week.    Pharmacotherapy We both agreed to :  continue same regimen   FOR ASSOCIATED CONDITIONS ADDRESSED TODAY:  Type 2 diabetes mellitus with obesity [facility]) Assessment & Plan: Lab Results  Component Value Date   INSULIN  12.4 05/21/2023   INSULIN  9.5 10/31/2020   INSULIN  28.9 (H) 09/01/2019      Component Value Date/Time   PROT 7.2 05/21/2023 1122   ALBUMIN 4.6 05/21/2023 1122   AST 13 05/21/2023 1122   ALT 15 05/21/2023 1122   ALKPHOS 104 05/21/2023 1122   BILITOT 0.9 05/21/2023 1122   BILIDIR 0.30 05/21/2023 1122   Lab Results  Component Value Date   TSH 2.910 05/21/2023   FREET4 1.55 05/21/2023   Lab Results  Component Value Date   WBC 10.4 05/21/2023   HGB 14.9 05/21/2023   HCT 44.2 05/21/2023   MCV 90 05/21/2023   PLT 355 05/21/2023   Lab Results  Component Value Date   VITAMINB12 948 05/21/2023   FOLATE 5.5 05/21/2023    Relevant medications: Jardiance, Mounjaro  15 mg weekly, Metformin, and Glimepiride . Denies symptoms of hypoglycemia or hyperglycemia. Since restarting our program 2 weeks ago, his fasting blood sugars have been ranging from 95-157.   Had the following labs with [facility] on 05/03/2023: Hemoglobin A1c of 8.5, BMP showed serum creatinine of 1.31, BUN of 28, and Glucose of 233. Reviewed labs  above which I ordered. His fasting insulin  is roughly 5x normal. His liver enzymes, thyroid  levels, blood counts, b12, and folate are within recommend limits.   Patient aware of disease state. Explained role of simple carbs and insulin  levels on hunger and cravings. Educated patient that having adequate amounts of protein with each meal is important for increasing muscle mass, stabilizing sugars, controlling hunger and cravings, and improving thermogenesis. Continue working on nutrition plan to decrease simple  carbohydrates, increase lean proteins and exercise to promote weight loss and improve glycemic control.   Coronary artery disease of native artery of native heart with stable angina pectoris (HCC) - h/o acute myocardial infarction/ Hyperlipidemia associated with type 2 diabetes mellitus [facility]) Assessment & Plan: Most recent lipid panel:  Lab Results  Component Value Date   CHOL 143 05/21/2023   HDL 47 05/21/2023   LDLCALC 78 05/21/2023   TRIG 97 05/21/2023   CHOLHDL 3.0 05/21/2023   Managed by Dr.Chandrasekhar of cardiology. Phx of CAD with 2012 STEMI. On Aspirin  81 mg daily and Lipitor 20 mg daily. Lipid panel within normal limits; no concerns in this regard. Continue to work on Engineer, technical sales. Increase exercise as able in the future.   Depressed mood - emotional eating Assessment & Plan: States that in the past 2 weeks,  his self harm thoughts have "not been as strong and invasive". No active thoughts today. Feels that eating healthier and doing more positive things for himself are helping. LOV, I placed an urgent ambulatory referral to psychiatry; unfortunately he was never contacted regarding this. I spoke with my CMA about this today and she was able to get an appointment for May 6 with a psychiatry provider who happens to be outside of [facility]. Pt agrees to see them and will also explore mental health services through the [place]. Continue PNP and start with gym 3 days a week.    Vitamin D  deficiency Assessment & Plan: Lab Results  Component Value Date   VD25OH 39.3 05/21/2023   VD25OH 73.1 10/05/2021   VD25OH 83.5 02/22/2021   Reports compliance with ergocalciferol  50,000 units q 3 weeks.  His Vitamin D  levels are not within the recommended limits of 50 to 70.  Pt has been instructed to take his ergocalciferol  50,000 units q 2 weeks. Recheck in 3 mos.    FOLLOW UP:   Return 06/25/2023. He was informed of the importance of frequent follow up visits to maximize his success  with intensive lifestyle modifications for his multiple health conditions.  Subjective:   Chief complaint: Obesity [name] is here to discuss his progress with his obesity treatment plan. He is on  the Category 2 Plan with Breakfast and Lunch options and states he is following his eating plan approximately 85 % of the time. He states he is not exercising.   Interval History:  [name] is here today for his first follow-up office visit since starting the program with us . Patient is off to a good start and has lost 2 lbs. States his clothes fit a bit looser. Reports weighing his proteins and veggies. Is mostly getting in all his meal plan foods. His main protein source is chicken. Has lean cuisine meals for dinner. Not using his snack calories. His hunger and cravings are controlled.   Pharmacotherapy that aid with weight loss: He is currently taking  Metformin XR 500 mg two times daily and Mounjaro  15 mg weekly .  Review of Systems:  Pertinent positives were  addressed with patient today. Reviewed by clinician on day of visit: allergies, medications, problem list, medical history, surgical history, family history, social history, and previous encounter notes.  Weight Summary and Biometrics   Weight Lost Since Last Visit: 2lb  Weight Gained Since Last Visit: 0lb   Vitals Temp: 97.9 F (36.6 C) BP: 116/73 Pulse Rate: 79 SpO2: 98 %   Anthropometric Measurements Height: 5\' 10"  (1.778 m) Weight: 271 lb (122.9 kg) BMI (Calculated): 38.88 Weight at Last Visit: 273lb Weight Lost Since Last Visit: 2lb Weight Gained Since Last Visit: 0lb Starting Weight: 273lb Total Weight Loss (lbs): 2 lb (0.907 kg) Peak Weight: 330lb   Body Composition  Body Fat %: 40.4 % Fat Mass (lbs): 109.6 lbs Muscle Mass (lbs): 153.8 lbs Total Body Water  (lbs): 124.4 lbs Visceral Fat Rating : 25   Other Clinical Data Fasting: No Labs: No Today's Visit #: 2 Starting Date: 05/21/23   Objective:    PHYSICAL EXAM:  Blood pressure 116/73, pulse 79, temperature 97.9 F (36.6 C), height 5\' 10"  (1.778 m), weight 271 lb (122.9 kg), SpO2 98%. Body mass index is 38.88 kg/m.  General: he is overweight, cooperative and in no acute distress.   HEENT: EOMI, sclerae are anicteric. Lungs: Normal breathing effort, no conversational dyspnea. M-Sk:  Normal gross ROM * 4 extremities  PSYCH: Has normal mood, affect and thought process. Neurologic: No gross sensory or motor deficits. Well developed, A and O * 3  DIAGNOSTIC DATA REVIEWED:  BMET    Component Value Date/Time   NA 137 10/11/2022 0950   K 4.8 10/11/2022 0950   CL 100 10/11/2022 0950   CO2 22 10/11/2022 0950   GLUCOSE 182 (H) 10/11/2022 0950   GLUCOSE 156 (H) 11/17/2019 1440   BUN 25 10/11/2022 0950   CREATININE 1.67 (H) 10/11/2022 0950   CREATININE 1.30 05/18/2019 1122   CALCIUM  9.5 10/11/2022 0950   GFRNONAA 67 03/14/2020 1446   GFRNONAA 61 05/18/2019 1122   GFRAA 77 03/14/2020 1446   GFRAA 71 05/18/2019 1122   Lab Results  Component Value Date   HGBA1C 6.3 (H) 10/05/2021   HGBA1C (H) 04/26/2010    11.9 (NOTE)                                                                       According to the ADA Clinical Practice Recommendations for 2011, when HbA1c is used as a screening test:   >=6.5%   Diagnostic of Diabetes Mellitus           (if abnormal result  is confirmed)  5.7-6.4%   Increased risk of developing Diabetes Mellitus  References:Diagnosis and Classification of Diabetes Mellitus,Diabetes Care,2011,34(Suppl 1):S62-S69 and Standards of Medical Care in         Diabetes - 2011,Diabetes Care,2011,34  (Suppl 1):S11-S61.   Lab Results  Component Value Date   INSULIN  12.4 05/21/2023   INSULIN  28.9 (H) 09/01/2019   Lab Results  Component Value Date   TSH 2.910 05/21/2023   CBC    Component Value Date/Time   WBC 10.4 05/21/2023 1122   WBC 7.3 11/17/2019 1440   RBC 4.92 05/21/2023 1122   RBC 4.73 11/17/2019  1440   HGB 14.9 05/21/2023 1122  HCT 44.2 05/21/2023 1122   PLT 355 05/21/2023 1122   MCV 90 05/21/2023 1122   MCH 30.3 05/21/2023 1122   MCH 30.0 11/17/2019 1440   MCHC 33.7 05/21/2023 1122   MCHC 33.6 11/17/2019 1440   RDW 12.4 05/21/2023 1122   Iron Studies No results found for: "IRON", "TIBC", "FERRITIN", "IRONPCTSAT" Lipid Panel     Component Value Date/Time   CHOL 143 05/21/2023 1122   TRIG 97 05/21/2023 1122   HDL 47 05/21/2023 1122   CHOLHDL 3.0 05/21/2023 1122   CHOLHDL 4.1 04/27/2010 0035   VLDL 29 04/27/2010 0035   LDLCALC 78 05/21/2023 1122   Hepatic Function Panel     Component Value Date/Time   PROT 7.2 05/21/2023 1122   ALBUMIN 4.6 05/21/2023 1122   AST 13 05/21/2023 1122   ALT 15 05/21/2023 1122   ALKPHOS 104 05/21/2023 1122   BILITOT 0.9 05/21/2023 1122   BILIDIR 0.30 05/21/2023 1122      Component Value Date/Time   TSH 2.910 05/21/2023 1122   Nutritional Lab Results  Component Value Date   VD25OH 39.3 05/21/2023   VD25OH 73.1 10/05/2021   VD25OH 83.5 02/22/2021    Attestations:   I, Special Puri, acting as a Stage manager for [employer], DO., have compiled all relevant documentation for today's office visit on behalf of Marceil Sensor, DO, while in the presence of [employer], DO.  I have spent 50 minutes in the care of the patient today. Specifically: 2 minutes was spent before the visit reviewing the chart. 41 minutes was spent on face-to-face counseling and reviewing listed medical problems above as outlined in office visit note, providing nutritional and behavioral counseling as outlined in obesity care plan, independently interpreting results and goals of care, see listed medical problems, and discussing biometric information and progress. We reviewed his meal plan and discussed how the foods he is eating is affecting each one of his labs. Pt educated on why we want him to eat various foods in various amounts and has a better  understanding of the nutritional plan because of this. All his questions were answered today. 7 minutes was spent after the visit  working on urgent referral to psychiatry and chart review.   I have reviewed the above documentation for accuracy and completeness, and I agree with the above. [name], D.O.  The 21st Century Cures Act was signed into law in 2016 which includes the topic of electronic health records.  This provides immediate access to information in MyChart.  This includes consultation notes, operative notes, office notes, lab results and pathology reports.  If you have any questions about what you read please let us  know at your next visit so we can discuss your concerns and take corrective action if need be.  We are right here with you.

## 2023-06-04 NOTE — Telephone Encounter (Signed)
 Contacted [facility] to schedule patient an appointment. Patient has been scheduled as noted:  [name] Monday, Jun 18, 2023 at 1:00 telehealth visit [phone]  Patient given this information before leaving and verbalized agreement and understanding.

## 2023-06-12 DIAGNOSIS — H43823 Vitreomacular adhesion, bilateral: Secondary | ICD-10-CM | POA: Diagnosis not present

## 2023-06-12 DIAGNOSIS — E103511 Type 1 diabetes mellitus with proliferative diabetic retinopathy with macular edema, right eye: Secondary | ICD-10-CM | POA: Diagnosis not present

## 2023-06-12 DIAGNOSIS — H1045 Other chronic allergic conjunctivitis: Secondary | ICD-10-CM | POA: Diagnosis not present

## 2023-06-12 DIAGNOSIS — E103412 Type 1 diabetes mellitus with severe nonproliferative diabetic retinopathy with macular edema, left eye: Secondary | ICD-10-CM | POA: Diagnosis not present

## 2023-06-12 DIAGNOSIS — E103411 Type 1 diabetes mellitus with severe nonproliferative diabetic retinopathy with macular edema, right eye: Secondary | ICD-10-CM | POA: Diagnosis not present

## 2023-06-12 DIAGNOSIS — H2513 Age-related nuclear cataract, bilateral: Secondary | ICD-10-CM | POA: Diagnosis not present

## 2023-06-18 DIAGNOSIS — F32A Depression, unspecified: Secondary | ICD-10-CM | POA: Diagnosis not present

## 2023-06-25 ENCOUNTER — Encounter (INDEPENDENT_AMBULATORY_CARE_PROVIDER_SITE_OTHER): Payer: Self-pay | Admitting: Family Medicine

## 2023-06-25 ENCOUNTER — Ambulatory Visit (INDEPENDENT_AMBULATORY_CARE_PROVIDER_SITE_OTHER): Admitting: Family Medicine

## 2023-06-25 VITALS — BP 106/70 | HR 76 | Temp 98.0°F | Ht 70.0 in | Wt 264.0 lb

## 2023-06-25 DIAGNOSIS — E1169 Type 2 diabetes mellitus with other specified complication: Secondary | ICD-10-CM | POA: Diagnosis not present

## 2023-06-25 DIAGNOSIS — Z6837 Body mass index (BMI) 37.0-37.9, adult: Secondary | ICD-10-CM

## 2023-06-25 DIAGNOSIS — Z7985 Long-term (current) use of injectable non-insulin antidiabetic drugs: Secondary | ICD-10-CM

## 2023-06-25 DIAGNOSIS — E1159 Type 2 diabetes mellitus with other circulatory complications: Secondary | ICD-10-CM | POA: Diagnosis not present

## 2023-06-25 DIAGNOSIS — I152 Hypertension secondary to endocrine disorders: Secondary | ICD-10-CM

## 2023-06-25 DIAGNOSIS — E559 Vitamin D deficiency, unspecified: Secondary | ICD-10-CM

## 2023-06-25 DIAGNOSIS — F5089 Other specified eating disorder: Secondary | ICD-10-CM

## 2023-06-25 DIAGNOSIS — E669 Obesity, unspecified: Secondary | ICD-10-CM

## 2023-06-25 DIAGNOSIS — E66813 Obesity, class 3: Secondary | ICD-10-CM

## 2023-06-25 DIAGNOSIS — R4589 Other symptoms and signs involving emotional state: Secondary | ICD-10-CM

## 2023-06-25 DIAGNOSIS — Z7984 Long term (current) use of oral hypoglycemic drugs: Secondary | ICD-10-CM

## 2023-06-25 NOTE — Progress Notes (Signed)
 [name], D.O.  ABFM, ABOM Specializing in Clinical Bariatric Medicine  Office located at: [address] [place], [place]  [address]   Assessment and Plan:   Medications Discontinued During This Encounter  Medication Reason   buPROPion  (WELLBUTRIN  SR) 150 MG 12 hr tablet Completed Course   tirzepatide  (MOUNJARO ) 12.5 MG/0.5ML Pen      FOR THE DISEASE OF OBESITY: BMI 37.0-37.9, adult -- Current BMI 37.88 Obesity, Start BMI 39.17 Assessment & Plan: Since last office visit on 06/04/23 patient's muscle mass has decreased by 1.4 lbs. Fat mass has decreased by 5.4 lbs. Total body water  has decreased by 6.8 lbs. Counseling done on how various foods will affect these numbers and how to maximize success.  Total lbs lost to date: 9 lbs Total weight loss percentage to date: -3.30%    Recommended Dietary Goals [name] is currently in the action stage of change. As such, his goal is to continue weight management plan.  He has agreed to: continue current plan   Behavioral Intervention We discussed the following today: increasing lean protein intake to established goals, avoiding skipping meals, and increasing water  intake   Additional resources provided today: None  Evidence-based interventions for health behavior change were utilized today including the discussion of self monitoring techniques, problem-solving barriers and SMART goal setting techniques.   Regarding patient's less desirable eating habits and patterns, we employed the technique of small changes.   Pt will specifically work on: increase protein intake, start going to the gym, and avoid skipping any meals for next visit.    Recommended Physical Activity Goals [name] has been advised to work up to 300-450 minutes of moderate intensity aerobic activity a week and strengthening exercises 2-3 times per week for cardiovascular health, weight loss maintenance and preservation of muscle mass.   He has agreed to:  Start going to the gym 2-3 days a week.     Pharmacotherapy We both agreed to: continue with nutritional and behavioral strategies and continue current med regimen.   ASSOCIATED CONDITIONS ADDRESSED TODAY: Type 2 diabetes mellitus with obesity [facility]) Assessment & Plan: Lab Results  Component Value Date   HGBA1C 6.3 (H) 10/05/2021   HGBA1C 6.9 (H) 02/22/2021   HGBA1C 6.4 (H) 10/31/2020   INSULIN  12.4 05/21/2023   INSULIN  9.5 10/31/2020   INSULIN  28.9 (H) 09/01/2019    Relevant medications: Jardiance 25 mg daily, Mounjaro  15 mg once weekly, Metformin XR 1,000 BID, and Glimepiride  2 mg BID. Good compliance and tolerance, no SE reported. Endorses episodes of fatigue, but has not been checking his BP or sugars during these episodes. Pt monitoring blood sugars regularly and reports they range from 75-156 and avg at 110. Has increased water  intake, but still not drinking enough water  as he should. Continue with current medication regimen. Continue monitoring blood glucose levels and advised pt to check his sugars and BP when feeling poorly. Pt verbalizes understanding and agreement.    Hypertension associated with type 2 diabetes mellitus [facility]) Assessment & Plan: BP Readings from Last 3 Encounters:  06/25/23 106/70  06/04/23 116/73  05/21/23 118/79   Relevant medication: Amlodipine  10 mg once daily, Carvedilol  6.25 mg BID, Losartan  50 mg BID. Good compliance and tolerances to these antihypertensives well with no side effects. No acute concerns. Continue with current antihypertensive regimen and following up with cardiology care team. Encouraged heart healthy diet via following her nutritional meal plan.   Depressed mood - emotional eating Assessment & Plan: Moods are  stable. Denies any SI/HI. Pt has been following up with a psychologist monthly via telehealth as well as behavioral health specialist. Was rx'd Prozac managed by behavioral health specialist, but has not started it yet.  Continue with current regimen as prescribed. Encouraged pt to continue following up with her behavioral health specialists as instructed by them.    Vitamin D  deficiency Assessment & Plan: Lab Results  Component Value Date   VD25OH 39.3 05/21/2023   VD25OH 73.1 10/05/2021   VD25OH 83.5 02/22/2021   Pt is compliant with ERGO 50K units every 2 weeks. Tolerating well with no adverse SE. Continue vit D supplementation. Will recheck levels periodically.    Follow up:   Return in about 30 days (around 07/25/2023) for Keep upcoming appt on 6/12, make another appt if pt desires. [name] He was informed of the importance of frequent follow up visits to maximize his success with intensive lifestyle modifications for his multiple health conditions.  Subjective:   Chief complaint: Obesity [name] is here to discuss his progress with his obesity treatment plan. He is on  the Category 2 Plan with Breakfast and Lunch options and states he is following his eating plan approximately 70% of the time. He states he is doing limited golf and walking 20 minutes 3 days per week.  Interval History:  [name] is here for a follow up office visit. Since last OV on 06/04/2023, down 7 lbs. Has been using [name] all natural meals, which has helped him eat healthy and convenient meals. Has been eating salads with banana peppers, low cal dressing, and [place] breast. States his clothes have been fitting much better.   Pharmacotherapy for weight loss: He is currently taking Metformin (off label use for incretin effect and / or insulin  resistance and / or diabetes prevention) with adequate clinical response  and without side effects., Monjauro with diabetes as the primary indication with adequate clinical response  and without side effects., and Jardiance with good compliance and tolerances.   Review of Systems:  Pertinent positives were addressed with patient today.  Reviewed by clinician on day of visit: allergies,  medications, problem list, medical history, surgical history, family history, social history, and previous encounter notes.  Weight Summary and Biometrics   Weight Lost Since Last Visit: 7lb  Weight Gained Since Last Visit: 0   Vitals Temp: 98 F (36.7 C) BP: 106/70 Pulse Rate: 76 SpO2: 97 %   Anthropometric Measurements Height: 5\' 10"  (1.778 m) Weight: 264 lb (119.7 kg) BMI (Calculated): 37.88 Weight at Last Visit: 271lb Weight Lost Since Last Visit: 7lb Weight Gained Since Last Visit: 0 Starting Weight: 273lb Total Weight Loss (lbs): 9 lb (4.082 kg) Peak Weight: 330lb   Body Composition  Body Fat %: 39.4 % Fat Mass (lbs): 104.2 lbs Muscle Mass (lbs): 152.4 lbs Total Body Water  (lbs): 117.6 lbs Visceral Fat Rating : 24   Other Clinical Data Fasting: no Labs: no Today's Visit #: 3 Starting Date: 05/21/23    Objective:   PHYSICAL EXAM: Blood pressure 106/70, pulse 76, temperature 98 F (36.7 C), height 5\' 10"  (1.778 m), weight 264 lb (119.7 kg), SpO2 97%. Body mass index is 37.88 kg/m.  General: he is overweight, cooperative and in no acute distress. PSYCH: Has normal mood, affect and thought process.   HEENT: EOMI, sclerae are anicteric. Lungs: Normal breathing effort, no conversational dyspnea. Extremities: Moves * 4 Neurologic: A and O * 3, good insight  DIAGNOSTIC DATA REVIEWED: BMET  Component Value Date/Time   NA 137 10/11/2022 0950   K 4.8 10/11/2022 0950   CL 100 10/11/2022 0950   CO2 22 10/11/2022 0950   GLUCOSE 182 (H) 10/11/2022 0950   GLUCOSE 156 (H) 11/17/2019 1440   BUN 25 10/11/2022 0950   CREATININE 1.67 (H) 10/11/2022 0950   CREATININE 1.30 05/18/2019 1122   CALCIUM  9.5 10/11/2022 0950   GFRNONAA 67 03/14/2020 1446   GFRNONAA 61 05/18/2019 1122   GFRAA 77 03/14/2020 1446   GFRAA 71 05/18/2019 1122   Lab Results  Component Value Date   HGBA1C 6.3 (H) 10/05/2021   HGBA1C (H) 04/26/2010    11.9 (NOTE)                                                                        According to the ADA Clinical Practice Recommendations for 2011, when HbA1c is used as a screening test:   >=6.5%   Diagnostic of Diabetes Mellitus           (if abnormal result  is confirmed)  5.7-6.4%   Increased risk of developing Diabetes Mellitus  References:Diagnosis and Classification of Diabetes Mellitus,Diabetes Care,2011,34(Suppl 1):S62-S69 and Standards of Medical Care in         Diabetes - 2011,Diabetes Care,2011,34  (Suppl 1):S11-S61.   Lab Results  Component Value Date   INSULIN  12.4 05/21/2023   INSULIN  28.9 (H) 09/01/2019   Lab Results  Component Value Date   TSH 2.910 05/21/2023   CBC    Component Value Date/Time   WBC 10.4 05/21/2023 1122   WBC 7.3 11/17/2019 1440   RBC 4.92 05/21/2023 1122   RBC 4.73 11/17/2019 1440   HGB 14.9 05/21/2023 1122   HCT 44.2 05/21/2023 1122   PLT 355 05/21/2023 1122   MCV 90 05/21/2023 1122   MCH 30.3 05/21/2023 1122   MCH 30.0 11/17/2019 1440   MCHC 33.7 05/21/2023 1122   MCHC 33.6 11/17/2019 1440   RDW 12.4 05/21/2023 1122   Iron Studies No results found for: "IRON", "TIBC", "FERRITIN", "IRONPCTSAT" Lipid Panel     Component Value Date/Time   CHOL 143 05/21/2023 1122   TRIG 97 05/21/2023 1122   HDL 47 05/21/2023 1122   CHOLHDL 3.0 05/21/2023 1122   CHOLHDL 4.1 04/27/2010 0035   VLDL 29 04/27/2010 0035   LDLCALC 78 05/21/2023 1122   Hepatic Function Panel     Component Value Date/Time   PROT 7.2 05/21/2023 1122   ALBUMIN 4.6 05/21/2023 1122   AST 13 05/21/2023 1122   ALT 15 05/21/2023 1122   ALKPHOS 104 05/21/2023 1122   BILITOT 0.9 05/21/2023 1122   BILIDIR 0.30 05/21/2023 1122      Component Value Date/Time   TSH 2.910 05/21/2023 1122   Nutritional Lab Results  Component Value Date   VD25OH 39.3 05/21/2023   VD25OH 73.1 10/05/2021   VD25OH 83.5 02/22/2021    Attestations:   I, Bernita Bristle, acting as a Stage manager for Marceil Sensor, DO., have  compiled all relevant documentation for today's office visit on behalf of Marceil Sensor, DO, while in the presence of [employer], DO.  Reviewed by clinician on day of visit: allergies, medications, problem list, medical history, surgical history, family  history, social history, and previous encounter notes pertinent to patient's obesity diagnosis.   I have reviewed the above documentation for accuracy and completeness, and I agree with the above. [name], D.O.  The 21st Century Cures Act was signed into law in 2016 which includes the topic of electronic health records.  This provides immediate access to information in MyChart.  This includes consultation notes, operative notes, office notes, lab results and pathology reports.  If you have any questions about what you read please let us  know at your next visit so we can discuss your concerns and take corrective action if need be.  We are right here with you.

## 2023-06-26 DIAGNOSIS — E103511 Type 1 diabetes mellitus with proliferative diabetic retinopathy with macular edema, right eye: Secondary | ICD-10-CM | POA: Diagnosis not present

## 2023-06-26 DIAGNOSIS — H43823 Vitreomacular adhesion, bilateral: Secondary | ICD-10-CM | POA: Diagnosis not present

## 2023-06-26 DIAGNOSIS — E103411 Type 1 diabetes mellitus with severe nonproliferative diabetic retinopathy with macular edema, right eye: Secondary | ICD-10-CM | POA: Diagnosis not present

## 2023-07-09 DIAGNOSIS — M109 Gout, unspecified: Secondary | ICD-10-CM | POA: Diagnosis not present

## 2023-07-09 DIAGNOSIS — E559 Vitamin D deficiency, unspecified: Secondary | ICD-10-CM | POA: Diagnosis not present

## 2023-07-09 DIAGNOSIS — Z125 Encounter for screening for malignant neoplasm of prostate: Secondary | ICD-10-CM | POA: Diagnosis not present

## 2023-07-09 DIAGNOSIS — Z0000 Encounter for general adult medical examination without abnormal findings: Secondary | ICD-10-CM | POA: Diagnosis not present

## 2023-07-09 DIAGNOSIS — I10 Essential (primary) hypertension: Secondary | ICD-10-CM | POA: Diagnosis not present

## 2023-07-09 DIAGNOSIS — N1831 Chronic kidney disease, stage 3a: Secondary | ICD-10-CM | POA: Diagnosis not present

## 2023-07-09 DIAGNOSIS — E113413 Type 2 diabetes mellitus with severe nonproliferative diabetic retinopathy with macular edema, bilateral: Secondary | ICD-10-CM | POA: Diagnosis not present

## 2023-07-09 LAB — HEPATIC FUNCTION PANEL
ALT: 13 U/L (ref 10–40)
AST: 14 (ref 14–40)
Bilirubin, Direct: 0.2 (ref 0.01–0.4)
Bilirubin, Total: 0.9

## 2023-07-09 LAB — COMPREHENSIVE METABOLIC PANEL WITH GFR
Albumin: 4.3 (ref 3.5–5.0)
Calcium: 9.5 (ref 8.7–10.7)
eGFR: 54

## 2023-07-09 LAB — BASIC METABOLIC PANEL WITH GFR
BUN: 29 — AB (ref 4–21)
CO2: 27 — AB (ref 13–22)
Chloride: 103 (ref 99–108)
Creatinine: 1.5 — AB (ref 0.6–1.3)
Glucose: 121
Potassium: 4.6 meq/L (ref 3.5–5.1)
Sodium: 138 (ref 137–147)

## 2023-07-09 LAB — HEMOGLOBIN A1C: Hemoglobin A1C: 5.9

## 2023-07-09 LAB — VITAMIN D 25 HYDROXY (VIT D DEFICIENCY, FRACTURES): Vit D, 25-Hydroxy: 42.7

## 2023-07-18 DIAGNOSIS — F32A Depression, unspecified: Secondary | ICD-10-CM | POA: Diagnosis not present

## 2023-07-25 ENCOUNTER — Encounter (INDEPENDENT_AMBULATORY_CARE_PROVIDER_SITE_OTHER): Payer: Self-pay | Admitting: Family Medicine

## 2023-07-25 ENCOUNTER — Ambulatory Visit (INDEPENDENT_AMBULATORY_CARE_PROVIDER_SITE_OTHER): Payer: Self-pay | Admitting: Family Medicine

## 2023-07-25 VITALS — BP 117/80 | HR 75 | Temp 98.5°F | Ht 70.0 in | Wt 256.0 lb

## 2023-07-25 DIAGNOSIS — E669 Obesity, unspecified: Secondary | ICD-10-CM

## 2023-07-25 DIAGNOSIS — Z7985 Long-term (current) use of injectable non-insulin antidiabetic drugs: Secondary | ICD-10-CM

## 2023-07-25 DIAGNOSIS — R4589 Other symptoms and signs involving emotional state: Secondary | ICD-10-CM

## 2023-07-25 DIAGNOSIS — Z7984 Long term (current) use of oral hypoglycemic drugs: Secondary | ICD-10-CM

## 2023-07-25 DIAGNOSIS — E1169 Type 2 diabetes mellitus with other specified complication: Secondary | ICD-10-CM | POA: Diagnosis not present

## 2023-07-25 DIAGNOSIS — I152 Hypertension secondary to endocrine disorders: Secondary | ICD-10-CM | POA: Diagnosis not present

## 2023-07-25 DIAGNOSIS — F39 Unspecified mood [affective] disorder: Secondary | ICD-10-CM | POA: Diagnosis not present

## 2023-07-25 DIAGNOSIS — E1159 Type 2 diabetes mellitus with other circulatory complications: Secondary | ICD-10-CM

## 2023-07-25 DIAGNOSIS — Z6836 Body mass index (BMI) 36.0-36.9, adult: Secondary | ICD-10-CM

## 2023-07-25 DIAGNOSIS — F5089 Other specified eating disorder: Secondary | ICD-10-CM

## 2023-07-25 DIAGNOSIS — Z6841 Body Mass Index (BMI) 40.0 and over, adult: Secondary | ICD-10-CM

## 2023-07-25 DIAGNOSIS — E559 Vitamin D deficiency, unspecified: Secondary | ICD-10-CM

## 2023-07-25 MED ORDER — VITAMIN D (ERGOCALCIFEROL) 1.25 MG (50000 UNIT) PO CAPS: ORAL_CAPSULE | ORAL | 0 refills | Status: DC

## 2023-07-25 NOTE — Progress Notes (Signed)
 [name], D.O.  ABFM, ABOM Specializing in Clinical Bariatric Medicine  Office located at: [address] [place], [place]  [address]   Assessment and Plan:  No orders of the defined types were placed in this encounter.   Medications Discontinued During This Encounter  Medication Reason   Vitamin D , Ergocalciferol , (DRISDOL ) 1.25 MG (50000 UNIT) CAPS capsule Reorder     Meds ordered this encounter  Medications   Vitamin D , Ergocalciferol , (DRISDOL ) 1.25 MG (50000 UNIT) CAPS capsule    Sig: 1 po q 2 wks    Dispense:  6 capsule    Refill:  0      FOR THE DISEASE OF OBESITY: BMI 36.0-36.9,adult -- Curent BMI 36.73 Obesity, Start BMI 39.17 Assessment & Plan: Since last office visit on 06/25/23 patient's muscle mass has decreased by 2.6 lbs. Fat mass has decreased by 5.6 lbs. Total body water  has decreased by 5.2 lbs.  Counseling done on how various foods will affect these numbers and how to maximize success  Total lbs lost to date: 17 lbs Total weight loss percentage to date: -6.23%    Recommended Dietary Goals [name] is currently in the action stage of change. As such, his goal is to continue weight management plan.  He has agreed to: continue current plan   Behavioral Intervention We discussed the following today: increasing lean protein intake to established goals, increasing water  intake , decreasing eating out or consumption of processed foods, and making healthy choices when eating convenient foods, and work on managing stress, creating time for self-care and relaxation  Additional resources provided today: None  Evidence-based interventions for health behavior change were utilized today including the discussion of self monitoring techniques, problem-solving barriers and SMART goal setting techniques.   Regarding patient's less desirable eating habits and patterns, we employed the technique of small changes.   Pt will specifically work on: Go to the  gym 2 days per week for next visit.    Recommended Physical Activity Goals [name] has been advised to work up to 300-450 minutes of moderate intensity aerobic activity a week and strengthening exercises 2-3 times per week for cardiovascular health, weight loss maintenance and preservation of muscle mass.   He has agreed to : Start strengthening exercises with a goal of 2-3 sessions a week    Pharmacotherapy We both agreed to : Continue with current nutritional and behavioral strategies   ASSOCIATED CONDITIONS ADDRESSED TODAY: Type 2 diabetes mellitus with obesity [facility]) Assessment & Plan: Lab Results  Component Value Date   HGBA1C 6.3 (H) 10/05/2021   HGBA1C 6.9 (H) 02/22/2021   HGBA1C 6.4 (H) 10/31/2020   INSULIN  12.4 05/21/2023   INSULIN  9.5 10/31/2020   INSULIN  28.9 (H) 09/01/2019    Relevant medications: Glimepiride  2 mg BID, Jardiance 25 mg daily, Metformin 1,000 BID, Mounjaro  15 mg once weekly. Good compliance and tolerance. His blood glucose readings average in 110s. Since 5/13 his highest reading has been 166 and lowest has been 91. Continue with current regimen, no changes made today. Decrease simple carbs/sugars, increase water , intake, and incorporate strength training 2-3x/week to exercise routine.   Hypertension associated with type 2 diabetes mellitus [facility]) Assessment & Plan: BP Readings from Last 3 Encounters:  07/25/23 117/80  06/25/23 106/70  06/04/23 116/73   BP at goal today. Compliant with Amlodipine  10 mg daily, Coreg  6.25 mg BID, and Losartan  50 mg BID. Tolerating well, no SE. Continue with antihypertensive regimen as prescribed. Decreased simple carbs  and sugars per heart healthy meal plan. Continue monitoring.    Depressed mood - emotional eating Assessment & Plan: Condition stable and well controlled. He reports seeing a psychiatrist who also provides counseling. Compliant with Prozac 20 mg for approximately 3-4 weeks; managed by psychiatrist. Racing  thoughts have improved. Denies any SI/HI. Continue with current mood regimen as prescribed. Follow up with psychiatrist as instructed by them. Several stress management strategies were discussed.    Vitamin D  deficiency Assessment & Plan: Lab Results  Component Value Date   VD25OH 39.3 05/21/2023   VD25OH 73.1 10/05/2021   VD25OH 83.5 02/22/2021   Complaint with ERGO 50K units and OTC Vit D 4,000 units daily. Tolerating well, no SE. No acute concerns, Reviewed ideal goal of 50-70. Continue with supplementation regimen. Refill ERGO with no changes.   Orders: - Refill ERGO, no dose changes   Follow up:   Return in about 6 weeks (around 09/05/2023). He was informed of the importance of frequent follow up visits to maximize his success with intensive lifestyle modifications for his multiple health conditions.  Subjective:   Chief complaint: Obesity [name] is here to discuss his progress with his obesity treatment plan. He is on  the Category 2 Plan with Breakfast and Lunch options  and states he is following his eating plan approximately 70% of the time. He states he is walking 20 minutes 2 days per week.  Interval History:  [name] is here for a follow up office visit. Since last OV on 5/13, he lost 5 lbs. Has been intentional with  meal prepping and planning. At times he falls off with meal prep/planning, but when he does it isn't that bad. Hunger is well controlled.  Pharmacotherapy for weight loss: He is currently taking Metformin, Mounjaro , and Jardiance.   Review of Systems:  Pertinent positives were addressed with patient today.  Reviewed by clinician on day of visit: allergies, medications, problem list, medical history, surgical history, family history, social history, and previous encounter notes.  Weight Summary and Biometrics   Weight Lost Since Last Visit: 5lb  Weight Gained Since Last Visit: 0lb   Vitals Temp: 98.5 F (36.9 C) BP: 117/80 Pulse Rate:  75 SpO2: 98 %   Anthropometric Measurements Height: 5' 10 (1.778 m) Weight: 256 lb (116.1 kg) BMI (Calculated): 36.73 Weight at Last Visit: 264lb Weight Lost Since Last Visit: 5lb Weight Gained Since Last Visit: 0lb Starting Weight: 273lb Total Weight Loss (lbs): 17 lb (7.711 kg) Peak Weight: 330lb   Body Composition  Body Fat %: 38.5 % Fat Mass (lbs): 98.6 lbs Muscle Mass (lbs): 149.8 lbs Total Body Water  (lbs): 112.4 lbs Visceral Fat Rating : 23   Other Clinical Data Fasting: No Labs: No Today's Visit #: 4 Starting Date: 05/21/23    Objective:   PHYSICAL EXAM: Blood pressure 117/80, pulse 75, temperature 98.5 F (36.9 C), height 5' 10 (1.778 m), weight 256 lb (116.1 kg), SpO2 98%. Body mass index is 36.73 kg/m.  General: he is overweight, cooperative and in no acute distress. PSYCH: Has normal mood, affect and thought process.   HEENT: EOMI, sclerae are anicteric. Lungs: Normal breathing effort, no conversational dyspnea. Extremities: Moves * 4 Neurologic: A and O * 3, good insight  DIAGNOSTIC DATA REVIEWED: BMET    Component Value Date/Time   NA 137 10/11/2022 0950   K 4.8 10/11/2022 0950   CL 100 10/11/2022 0950   CO2 22 10/11/2022 0950   GLUCOSE 182 (H) 10/11/2022  0950   GLUCOSE 156 (H) 11/17/2019 1440   BUN 25 10/11/2022 0950   CREATININE 1.67 (H) 10/11/2022 0950   CREATININE 1.30 05/18/2019 1122   CALCIUM  9.5 10/11/2022 0950   GFRNONAA 67 03/14/2020 1446   GFRNONAA 61 05/18/2019 1122   GFRAA 77 03/14/2020 1446   GFRAA 71 05/18/2019 1122   Lab Results  Component Value Date   HGBA1C 6.3 (H) 10/05/2021   HGBA1C (H) 04/26/2010    11.9 (NOTE)                                                                       According to the ADA Clinical Practice Recommendations for 2011, when HbA1c is used as a screening test:   >=6.5%   Diagnostic of Diabetes Mellitus           (if abnormal result  is confirmed)  5.7-6.4%   Increased risk of developing  Diabetes Mellitus  References:Diagnosis and Classification of Diabetes Mellitus,Diabetes Care,2011,34(Suppl 1):S62-S69 and Standards of Medical Care in         Diabetes - 2011,Diabetes Care,2011,34  (Suppl 1):S11-S61.   Lab Results  Component Value Date   INSULIN  12.4 05/21/2023   INSULIN  28.9 (H) 09/01/2019   Lab Results  Component Value Date   TSH 2.910 05/21/2023   CBC    Component Value Date/Time   WBC 10.4 05/21/2023 1122   WBC 7.3 11/17/2019 1440   RBC 4.92 05/21/2023 1122   RBC 4.73 11/17/2019 1440   HGB 14.9 05/21/2023 1122   HCT 44.2 05/21/2023 1122   PLT 355 05/21/2023 1122   MCV 90 05/21/2023 1122   MCH 30.3 05/21/2023 1122   MCH 30.0 11/17/2019 1440   MCHC 33.7 05/21/2023 1122   MCHC 33.6 11/17/2019 1440   RDW 12.4 05/21/2023 1122   Iron Studies No results found for: IRON, TIBC, FERRITIN, IRONPCTSAT Lipid Panel     Component Value Date/Time   CHOL 143 05/21/2023 1122   TRIG 97 05/21/2023 1122   HDL 47 05/21/2023 1122   CHOLHDL 3.0 05/21/2023 1122   CHOLHDL 4.1 04/27/2010 0035   VLDL 29 04/27/2010 0035   LDLCALC 78 05/21/2023 1122   Hepatic Function Panel     Component Value Date/Time   PROT 7.2 05/21/2023 1122   ALBUMIN 4.6 05/21/2023 1122   AST 13 05/21/2023 1122   ALT 15 05/21/2023 1122   ALKPHOS 104 05/21/2023 1122   BILITOT 0.9 05/21/2023 1122   BILIDIR 0.30 05/21/2023 1122      Component Value Date/Time   TSH 2.910 05/21/2023 1122   Nutritional Lab Results  Component Value Date   VD25OH 39.3 05/21/2023   VD25OH 73.1 10/05/2021   VD25OH 83.5 02/22/2021    Attestations:   I, Bernita Bristle, acting as a Stage manager for Marceil Sensor, DO., have compiled all relevant documentation for today's office visit on behalf of Marceil Sensor, DO, while in the presence of [employer], DO.  Reviewed by clinician on day of visit: allergies, medications, problem list, medical history, surgical history, family history, social  history, and previous encounter notes pertinent to patient's obesity diagnosis.  I have reviewed the above documentation for accuracy and completeness, and I agree with the above. Marietta Shorter , D.O.  The 21st Century Cures Act was signed into law in 2016 which includes the topic of electronic health records.  This provides immediate access to information in MyChart.  This includes consultation notes, operative notes, office notes, lab results and pathology reports.  If you have any questions about what you read please let us  know at your next visit so we can discuss your concerns and take corrective action if need be.  We are right here with you.

## 2023-08-15 DIAGNOSIS — F32A Depression, unspecified: Secondary | ICD-10-CM | POA: Diagnosis not present

## 2023-08-22 ENCOUNTER — Ambulatory Visit (INDEPENDENT_AMBULATORY_CARE_PROVIDER_SITE_OTHER): Admitting: Family Medicine

## 2023-08-26 DIAGNOSIS — G4733 Obstructive sleep apnea (adult) (pediatric): Secondary | ICD-10-CM | POA: Diagnosis not present

## 2023-08-27 ENCOUNTER — Other Ambulatory Visit (HOSPITAL_COMMUNITY): Payer: Self-pay

## 2023-08-27 MED ORDER — MOUNJARO 15 MG/0.5ML ~~LOC~~ SOAJ
15.0000 mg | SUBCUTANEOUS | 2 refills | Status: AC
  Filled 2023-08-27: qty 2, 28d supply, fill #0
  Filled 2023-10-06: qty 2, 28d supply, fill #1
  Filled 2023-11-05: qty 2, 28d supply, fill #2

## 2023-08-28 DIAGNOSIS — R972 Elevated prostate specific antigen [PSA]: Secondary | ICD-10-CM | POA: Diagnosis not present

## 2023-08-30 ENCOUNTER — Telehealth: Payer: Self-pay | Admitting: Internal Medicine

## 2023-08-30 ENCOUNTER — Other Ambulatory Visit: Payer: Self-pay | Admitting: Internal Medicine

## 2023-08-30 MED ORDER — AMLODIPINE BESYLATE 10 MG PO TABS: 10.0000 mg | ORAL_TABLET | Freq: Every day | ORAL | 0 refills | Status: DC

## 2023-08-30 NOTE — Telephone Encounter (Signed)
 Pt's medication was sent to pt's pharmacy as requested. Confirmation received.

## 2023-08-30 NOTE — Telephone Encounter (Signed)
*  STAT* If patient is at the pharmacy, call can be transferred to refill team.   1. Which medications need to be refilled? (please list name of each medication and dose if known) amLODipine  (NORVASC ) 10 MG tablet   2. Which pharmacy/location (including street and city if local pharmacy) is medication to be sent to? [id number] - [place], [place] - [address]   3. Do they need a 30 day or 90 day supply? 90

## 2023-08-31 ENCOUNTER — Other Ambulatory Visit: Payer: Self-pay | Admitting: Internal Medicine

## 2023-09-05 ENCOUNTER — Encounter (INDEPENDENT_AMBULATORY_CARE_PROVIDER_SITE_OTHER): Payer: Self-pay | Admitting: Family Medicine

## 2023-09-05 ENCOUNTER — Ambulatory Visit (INDEPENDENT_AMBULATORY_CARE_PROVIDER_SITE_OTHER): Payer: Self-pay | Admitting: Family Medicine

## 2023-09-05 VITALS — BP 121/73 | HR 69 | Temp 98.7°F | Ht 70.0 in | Wt 251.0 lb

## 2023-09-05 DIAGNOSIS — F5089 Other specified eating disorder: Secondary | ICD-10-CM

## 2023-09-05 DIAGNOSIS — E1169 Type 2 diabetes mellitus with other specified complication: Secondary | ICD-10-CM | POA: Diagnosis not present

## 2023-09-05 DIAGNOSIS — Z7984 Long term (current) use of oral hypoglycemic drugs: Secondary | ICD-10-CM

## 2023-09-05 DIAGNOSIS — E65 Localized adiposity: Secondary | ICD-10-CM

## 2023-09-05 DIAGNOSIS — Z6836 Body mass index (BMI) 36.0-36.9, adult: Secondary | ICD-10-CM

## 2023-09-05 DIAGNOSIS — R748 Abnormal levels of other serum enzymes: Secondary | ICD-10-CM

## 2023-09-05 DIAGNOSIS — E66813 Obesity, class 3: Secondary | ICD-10-CM

## 2023-09-05 DIAGNOSIS — E1159 Type 2 diabetes mellitus with other circulatory complications: Secondary | ICD-10-CM

## 2023-09-05 DIAGNOSIS — E559 Vitamin D deficiency, unspecified: Secondary | ICD-10-CM | POA: Diagnosis not present

## 2023-09-05 DIAGNOSIS — E669 Obesity, unspecified: Secondary | ICD-10-CM

## 2023-09-05 MED ORDER — VITAMIN D (ERGOCALCIFEROL) 1.25 MG (50000 UNIT) PO CAPS: ORAL_CAPSULE | ORAL | 0 refills | Status: DC

## 2023-09-05 MED ORDER — GLIMEPIRIDE 1 MG PO TABS: ORAL_TABLET | ORAL | 0 refills | Status: DC

## 2023-09-05 NOTE — Progress Notes (Signed)
 [name], D.O.  ABFM, ABOM Specializing in Clinical Bariatric Medicine  Office located at: [address] [place], [place]  [address]   Assessment and Plan:   Medications Discontinued During This Encounter  Medication Reason   glimepiride  (AMARYL ) 2 MG tablet    Vitamin D , Ergocalciferol , (DRISDOL ) 1.25 MG (50000 UNIT) CAPS capsule Reorder    Meds ordered this encounter  Medications   Vitamin D , Ergocalciferol , (DRISDOL ) 1.25 MG (50000 UNIT) CAPS capsule    Sig: 1 po q 2 wks    Dispense:  6 capsule    Refill:  0   glimepiride  (AMARYL ) 1 MG tablet    Sig: TAKE 1 TABLET BY MOUTH  BID    Dispense:  60 tablet    Refill:  0     Come fasting to next OV.   FOR THE DISEASE OF OBESITY: Obesity, Start BMI 39.17 BMI 36.0-36.9,adult -- Curent BMI 36.01 Assessment & Plan: Since last office visit on 6/12 patient's muscle mass has decreased by 2.6 lbs. Fat mass has decreased by 1.6 lbs. Total body water  has increased by 2.4 lbs.  Counseling done on how various foods will affect these numbers and how to maximize success  Total lbs lost to date: 22 lbs Total weight loss percentage to date: -8.06 %   Recommended Dietary Goals [name] is currently in the action stage of change. As such, his goal is to continue weight management plan.  He has agreed to: continue current plan   Behavioral Intervention We discussed the following today: increasing lean protein intake to established goals, decreasing simple carbohydrates , increasing water  intake , work on meal planning and preparation, keeping healthy foods at home, decreasing eating out or consumption of processed foods, and making healthy choices when eating convenient foods, and continue to practice mindfulness when eating, try to eat dinner at an earlier time if able or split dinner into 2 smaller meals if needed.   Additional resources provided today: None  Evidence-based interventions for health behavior change were  utilized today including the discussion of self monitoring techniques, problem-solving barriers and SMART goal setting techniques.   Regarding patient's less desirable eating habits and patterns, we employed the technique of small changes.   Pt will specifically work on: Mindful eating    Recommended Physical Activity Goals [name] has been advised to work up to 300-450 minutes of moderate intensity aerobic activity a week and strengthening exercises 2-3 times per week for cardiovascular health, weight loss maintenance and preservation of muscle mass.   He has agreed to: Gradually start going to the gym and increasing physical activity    Pharmacotherapy We both agreed to: Continue with current nutritional and behavioral strategies and continue meds that aid in wt loss (Metformin and Mounjaro )    ASSOCIATED CONDITIONS ADDRESSED TODAY:  Type 2 diabetes mellitus with obesity (HCC) Assessment & Plan: Pt showed us  results obtained by PCP. BMP obtained on 07/09/23 by PCP; showed serum creatinine of 1.31 and blood glucose of 233. On 05/03/23, his HcbA1c was 8.5. These results are not available on our EHR; pt showing results from his phone. Pt will bring a hardcopy of these results in the future.   Relevant medications: Glimepiride  2 mg BID, Jardiance 25 mg daily, Metformin 1,000 BID, and Mounjaro  15 mg once weekly. Checking fasting sugars every morning, readings are as follows: 90, 118, 124, 91, 144, 79.   Continue current med regimen as prescribed. Reminded pt of obesogenic nature of Glimepiride   that makes weight loss more challenging. Pt would like to continue at this time. Will refill Glimepiride  today. Work on increasing protein intake to help improve glycemic control. Also work on decreasing simple carbs and added sugars.     Visceral obesity Assessment & Plan: Visceral fat rating is 23 today. Has improved from his highest reading of 25 on 06/04/23. Also had value of 40.4% body fat  percentage on 06/04/23; highest value in the last 4 years. Today, his body fat percentage improved to 38.5%.   Reviewed how visceral fat rating should be <10 for males. Reminded pt of visceral obesity increasing risk of causing or worsening other chronic diseases/conditions. Losing 7-10% of his body weight will help improve condition.     Elevated liver enzymes Assessment & Plan: Lab Results  Component Value Date   ALT 15 05/21/2023   AST 13 05/21/2023   ALKPHOS 104 05/21/2023   BILITOT 0.9 05/21/2023    Hx of elevated liver enzymes. Pt has not been diagnosed with fatty liver disease with US . He does report drinking alcohol.   Visceral obesity and elevated liver enzymes increases risk of fatty liver and cardiovascular disease. Educated on pathophysiology of MASLD, fatty liver disease and the potential progression to cirrhosis if poorly controlled. Discussed and offered the option of US  abdomen to determine potential formal diagnosis of fatty liver disease. Pt declines at this time. Encouraged pt to cut back on alcohol consumption. Focus on lean protein to promote improving muscle mass. Increase exercise to moderate intensity to improve cardiovascular health and promote weight loss.     Other disorder of eating with emotional eating Assessment & Plan: Condition stable and well controlled. Pt established with a psychiatrist. Compliant with Prozac 20 mg once daily with good tolerance. Hunger/cravings are well controlled. States his self-control has also improved.   Continue with current regimen. F/u with psychiatrist as advised by them. Continue practicing mindful eating habits. Will continue monitoring alongside PCP/psychiatrist as it relates to his wt loss journey.     Vitamin D  deficiency Assessment & Plan: Lab Results  Component Value Date   VD25OH 39.3 05/21/2023   VD25OH 73.1 10/05/2021   VD25OH 83.5 02/22/2021   Last labs obtained by PCP showed vit D of 42.7 on 07/09/23. Pt  taking ERGO 50K units once every 2 weeks. Good compliance/tolerance. No nausea, muscle cramps, or other adverse SE. No acute concerns.   Ideal vit D levels of 50-70 reviewed with pt. Continue current supplementation regimen. Will refill ERGO today, no dose changes made.     Follow up:   Return in 6 weeks (on 10/17/2023) for f/u in 6 weeks.  He was informed of the importance of frequent follow up visits to maximize his success with intensive lifestyle modifications for his multiple health conditions.  Subjective:   Chief complaint: Obesity [name] is here to discuss his progress with his obesity treatment plan. He is on the Category 2 Plan w B/L options and states he is following his eating plan approximately 75% of the time. He states he is not exercising.  Interval History:  [name] is here for a follow up office visit. Since last OV on 07/25/23, he is down 5 lbs. He reports off-plan eating and admits to recently eating at [facility]. Has removed all tempting unhealthy foods from his house. He tends to avoid eating late at night, stating it effects his sugars the next morning.    Pharmacotherapy that aid with weight loss: He is  currently taking Metformin 1,000 BID and Mounjaro  15 mg once weekly.   Review of Systems:  Pertinent positives were addressed with patient today.  Reviewed by clinician on day of visit: allergies, medications, problem list, medical history, surgical history, family history, social history, and previous encounter notes.  Weight Summary and Biometrics   Weight Lost Since Last Visit: 5lb  Weight Gained Since Last Visit: 0lb    Vitals Temp: 98.7 F (37.1 C) BP: 121/73 Pulse Rate: 69 SpO2: 98 %   Anthropometric Measurements Height: 5' 10 (1.778 m) Weight: 251 lb (113.9 kg) BMI (Calculated): 36.01 Weight at Last Visit: 256lb Weight Lost Since Last Visit: 5lb Weight Gained Since Last Visit: 0lb Starting Weight: 273lb Total Weight Loss  (lbs): 22 lb (9.979 kg) Peak Weight: 330lb   Body Composition  Body Fat %: 38.5 % Fat Mass (lbs): 97 lbs Muscle Mass (lbs): 147.2 lbs Total Body Water  (lbs): 114.8 lbs Visceral Fat Rating : 23   Other Clinical Data Fasting: No Labs: No Today's Visit #: 5 Starting Date: 05/21/23 Comments: Cat 2    Objective:   PHYSICAL EXAM: Blood pressure 121/73, pulse 69, temperature 98.7 F (37.1 C), height 5' 10 (1.778 m), weight 251 lb (113.9 kg), SpO2 98%. Body mass index is 36.01 kg/m.  General: he is overweight, cooperative and in no acute distress. PSYCH: Has normal mood, affect and thought process.   HEENT: EOMI, sclerae are anicteric. Lungs: Normal breathing effort, no conversational dyspnea. Extremities: Moves * 4 Neurologic: A and O * 3, good insight  DIAGNOSTIC DATA REVIEWED: BMET    Component Value Date/Time   NA 137 10/11/2022 0950   K 4.8 10/11/2022 0950   CL 100 10/11/2022 0950   CO2 22 10/11/2022 0950   GLUCOSE 182 (H) 10/11/2022 0950   GLUCOSE 156 (H) 11/17/2019 1440   BUN 25 10/11/2022 0950   CREATININE 1.67 (H) 10/11/2022 0950   CREATININE 1.30 05/18/2019 1122   CALCIUM  9.5 10/11/2022 0950   GFRNONAA 67 03/14/2020 1446   GFRNONAA 61 05/18/2019 1122   GFRAA 77 03/14/2020 1446   GFRAA 71 05/18/2019 1122   Lab Results  Component Value Date   HGBA1C 6.3 (H) 10/05/2021   HGBA1C (H) 04/26/2010    11.9 (NOTE)                                                                       According to the ADA Clinical Practice Recommendations for 2011, when HbA1c is used as a screening test:   >=6.5%   Diagnostic of Diabetes Mellitus           (if abnormal result  is confirmed)  5.7-6.4%   Increased risk of developing Diabetes Mellitus  References:Diagnosis and Classification of Diabetes Mellitus,Diabetes Care,2011,34(Suppl 1):S62-S69 and Standards of Medical Care in         Diabetes - 2011,Diabetes Care,2011,34  (Suppl 1):S11-S61.   Lab Results  Component Value  Date   INSULIN  12.4 05/21/2023   INSULIN  28.9 (H) 09/01/2019   Lab Results  Component Value Date   TSH 2.910 05/21/2023   CBC    Component Value Date/Time   WBC 10.4 05/21/2023 1122   WBC 7.3 11/17/2019 1440   RBC 4.92 05/21/2023 1122  RBC 4.73 11/17/2019 1440   HGB 14.9 05/21/2023 1122   HCT 44.2 05/21/2023 1122   PLT 355 05/21/2023 1122   MCV 90 05/21/2023 1122   MCH 30.3 05/21/2023 1122   MCH 30.0 11/17/2019 1440   MCHC 33.7 05/21/2023 1122   MCHC 33.6 11/17/2019 1440   RDW 12.4 05/21/2023 1122   Iron Studies No results found for: IRON, TIBC, FERRITIN, IRONPCTSAT Lipid Panel     Component Value Date/Time   CHOL 143 05/21/2023 1122   TRIG 97 05/21/2023 1122   HDL 47 05/21/2023 1122   CHOLHDL 3.0 05/21/2023 1122   CHOLHDL 4.1 04/27/2010 0035   VLDL 29 04/27/2010 0035   LDLCALC 78 05/21/2023 1122   Hepatic Function Panel     Component Value Date/Time   PROT 7.2 05/21/2023 1122   ALBUMIN 4.6 05/21/2023 1122   AST 13 05/21/2023 1122   ALT 15 05/21/2023 1122   ALKPHOS 104 05/21/2023 1122   BILITOT 0.9 05/21/2023 1122   BILIDIR 0.30 05/21/2023 1122      Component Value Date/Time   TSH 2.910 05/21/2023 1122   Nutritional Lab Results  Component Value Date   VD25OH 39.3 05/21/2023   VD25OH 73.1 10/05/2021   VD25OH 83.5 02/22/2021    Attestations:   I, Vernell Forest, acting as a Stage manager for James Jenkins, DO., have compiled all relevant documentation for today's office visit on behalf of James Jenkins, DO, while in the presence of [employer], DO.  I have reviewed the above documentation for accuracy and completeness, and I agree with the above. [name], D.O.  The 21st Century Cures Act was signed into law in 2016 which includes the topic of electronic health records.  This provides immediate access to information in MyChart.  This includes consultation notes, operative notes, office notes, lab results and pathology reports.   If you have any questions about what you read please let us  know at your next visit so we can discuss your concerns and take corrective action if need be.  We are right here with you.

## 2023-09-17 ENCOUNTER — Other Ambulatory Visit: Payer: Self-pay | Admitting: Internal Medicine

## 2023-09-26 DIAGNOSIS — F32A Depression, unspecified: Secondary | ICD-10-CM | POA: Diagnosis not present

## 2023-10-08 DIAGNOSIS — N1831 Chronic kidney disease, stage 3a: Secondary | ICD-10-CM | POA: Diagnosis not present

## 2023-10-08 DIAGNOSIS — I2510 Atherosclerotic heart disease of native coronary artery without angina pectoris: Secondary | ICD-10-CM | POA: Diagnosis not present

## 2023-10-08 DIAGNOSIS — E1140 Type 2 diabetes mellitus with diabetic neuropathy, unspecified: Secondary | ICD-10-CM | POA: Diagnosis not present

## 2023-10-08 DIAGNOSIS — H35033 Hypertensive retinopathy, bilateral: Secondary | ICD-10-CM | POA: Diagnosis not present

## 2023-10-08 DIAGNOSIS — E113413 Type 2 diabetes mellitus with severe nonproliferative diabetic retinopathy with macular edema, bilateral: Secondary | ICD-10-CM | POA: Diagnosis not present

## 2023-10-17 ENCOUNTER — Ambulatory Visit (INDEPENDENT_AMBULATORY_CARE_PROVIDER_SITE_OTHER): Payer: Self-pay | Admitting: Family Medicine

## 2023-10-22 ENCOUNTER — Ambulatory Visit: Payer: Self-pay | Attending: Internal Medicine | Admitting: Internal Medicine

## 2023-10-22 ENCOUNTER — Encounter: Payer: Self-pay | Admitting: Internal Medicine

## 2023-10-22 VITALS — BP 104/68 | HR 78 | Resp 16 | Ht 70.0 in | Wt 251.6 lb

## 2023-10-22 DIAGNOSIS — I152 Hypertension secondary to endocrine disorders: Secondary | ICD-10-CM | POA: Diagnosis not present

## 2023-10-22 DIAGNOSIS — I25118 Atherosclerotic heart disease of native coronary artery with other forms of angina pectoris: Secondary | ICD-10-CM

## 2023-10-22 DIAGNOSIS — E1159 Type 2 diabetes mellitus with other circulatory complications: Secondary | ICD-10-CM

## 2023-10-22 DIAGNOSIS — I739 Peripheral vascular disease, unspecified: Secondary | ICD-10-CM | POA: Diagnosis not present

## 2023-10-22 NOTE — Patient Instructions (Signed)
 Medication Instructions:  Your physician recommends that you continue on your current medications as directed. Please refer to the Current Medication list given to you today.  *If you need a refill on your cardiac medications before your next appointment, please call your pharmacy*  Lab Work: NONE  If you have labs (blood work) drawn today and your tests are completely normal, you will receive your results only by: MyChart Message (if you have MyChart) OR A paper copy in the mail If you have any lab test that is abnormal or we need to change your treatment, we will call you to review the results.  Testing/Procedures: NONE  Follow-Up: At [facility], you and your health needs are our priority.  As part of our continuing mission to provide you with exceptional heart care, our providers are all part of one team.  This team includes your primary Cardiologist (physician) and Advanced Practice Providers or APPs (Physician Assistants and Nurse Practitioners) who all work together to provide you with the care you need, when you need it.  Your next appointment:   1 year(s)  Provider:   Stanly DELENA Leavens, MD

## 2023-10-22 NOTE — Progress Notes (Signed)
 Cardiology Office Note:    Date:  10/22/2023   ID:  STEDMAN SUMMERVILLE, DOB 28-Mar-1960, MRN [id number]  PCP:  Charlott Dorn LABOR, MD   [facility] Providers Cardiologist:  Stanly LABOR Leavens, MD     Referring MD: Charlott Dorn LABOR, *   CC: CAD f/u  History of Present Illness:    [name] is a 63 y.o. male with a hx of DM with retinopathy and prior DKE, Morbid Obesity, HTN, HLD, and OSA on CPAP.  Phx of CAD with 2012 STEMI (indigestion was anginal equivalent); with PCI 04/2010. 2021: Has shoulder surgery without issue 2023: Had ABI's and wanted second opinion for PAD 2024: Asymptomatic  [name] is a 63 year old male with coronary artery disease who presents for follow-up regarding blood pressure management and medication review.  He has a history of coronary artery disease, having undergone a PCI in 2012 following an LAD and STEMI event. Since 2012, he has not experienced any chest pain or indigestion symptoms. His cholesterol levels have improved, with LDL currently at 78, though the target is under 70.  He has been monitoring his blood pressure at home, which has been lower than usual, ranging from 125 to 140 over 80. He feels well overall, with no chest pain or breathing difficulties.  He has been actively trying to lose weight, resulting in a significant drop in his A1c from 8.5 to 5.9 over the past four to five months. He is currently working part-time and plans to retire in about five years.  He experiences easy bruising and skin tearing, which he attributes to 'old man skin,' noting it is more of an irritation than a major concern. He is currently on aspirin , which may contribute to this issue.  He is on Mounjaro , which was recently adjusted, and is also on losartan  (Cozaar ) for blood pressure management.  Past Medical History:  Diagnosis Date   ADD (attention deficit disorder)    Anginal pain (HCC) 2012   Arthritis, shoulder region     B12 deficiency    Back pain    Coronary artery disease    Diabetes (HCC)    Diabetic retinopathy (HCC)    GERD (gastroesophageal reflux disease)    H/O heart artery stent 04/2010   Heartburn    High cholesterol    Hyperlipidemia    Hypertension    Joint pain    Kidney problem    Knee pain    Neuropathy    NSTEMI (non-ST elevated myocardial infarction) (HCC)    Obesity    Osteoarthritis    Persistent cough    stopped for a year   Sleep apnea    Swallowing difficulty    Swallowing difficulty    Swelling of both lower extremities     Past Surgical History:  Procedure Laterality Date   ACHILLES TENDON REPAIR Right    AMPUTATION TOE Right 03/14/2019   Procedure: AMPUTATION TOE, 5th toe;  Surgeon: Burt Fus, DPM;  Location: [facility] OR;  Service: Podiatry;  Laterality: Right;   heart stent  2012   x1   INCISION AND DRAINAGE Right 03/14/2019   Procedure: INCISION AND DRAINAGE;  Surgeon: Burt Fus, DPM;  Location: [facility] OR;  Service: Podiatry;  Laterality: Right;   SHOULDER ARTHROSCOPY Right    TEE WITHOUT CARDIOVERSION N/A 03/17/2019   Procedure: TRANSESOPHAGEAL ECHOCARDIOGRAM (TEE);  Surgeon: Raford Riggs, MD;  Location: [facility] ENDOSCOPY;  Service: Cardiovascular;  Laterality: N/A;   TOE SURGERY  Right 06/2018   for a hammer toe   TOTAL SHOULDER ARTHROPLASTY Right 11/19/2019   Procedure: TOTAL SHOULDER ARTHROPLASTY;  Surgeon: Dozier Soulier, MD;  Location: [facility] ORS;  Service: Orthopedics;  Laterality: Right;   UVULOPALATOPHARYNGOPLASTY      Current Medications: Current Meds  Medication Sig   amLODipine  (NORVASC ) 10 MG tablet Take 1 tablet (10 mg total) by mouth daily.   aspirin  EC 81 MG tablet Take 81 mg by mouth daily.   atorvastatin  (LIPITOR) 20 MG tablet Take 1 tablet (20 mg total) by mouth daily.   carvedilol  (COREG ) 6.25 MG tablet Take 6.25 mg by mouth 2 (two) times daily.   cetirizine (ZYRTEC) 10 MG tablet Take 10 mg by mouth daily.   Cholecalciferol  (VITAMIN D ) 50  MCG (2000 UT) tablet Take 4,000 Units by mouth daily.   FLUoxetine (PROZAC) 20 MG capsule Take 20 mg by mouth every morning.   glimepiride  (AMARYL ) 1 MG tablet TAKE 1 TABLET BY MOUTH  BID   JARDIANCE 25 MG TABS tablet Take 25 mg by mouth daily.   loratadine (CLARITIN) 10 MG tablet Take 10 mg by mouth daily.    losartan  (COZAAR ) 50 MG tablet Take 1 tablet by mouth twice daily   Magnesium 400 MG CAPS Take 400 mg by mouth daily.    metFORMIN (GLUCOPHAGE-XR) 500 MG 24 hr tablet Take 1,000 mg by mouth 2 (two) times daily.    pantoprazole  (PROTONIX ) 40 MG tablet TAKE 1 TABLET BY MOUTH TWICE DAILY (Patient taking differently: Take 40 mg by mouth 2 (two) times daily.)   tirzepatide  (MOUNJARO ) 15 MG/0.5ML Pen Inject 15 mg into the skin once a week.   vitamin B-12 (CYANOCOBALAMIN ) 500 MCG tablet Take 500 mcg by mouth daily.    Vitamin D , Ergocalciferol , (DRISDOL ) 1.25 MG (50000 UNIT) CAPS capsule 1 po q 2 wks     Allergies:   Patient has no known allergies.   Social History   Socioeconomic History   Marital status: Divorced    Spouse name: Not on file   Number of children: Not on file   Years of education: Not on file   Highest education level: Not on file  Occupational History   Occupation: unemployed  Tobacco Use   Smoking status: Never    Passive exposure: Yes   Smokeless tobacco: Never   Tobacco comments:    Both parents growing up.   Vaping Use   Vaping status: Never Used  Substance and Sexual Activity   Alcohol use: Yes    Comment: 4-5 on weekends   Drug use: No   Sexual activity: Not on file  Other Topics Concern   Not on file  Social History Narrative   Originally from [place], [place]. Previously has lived in [place] & [place] . 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 No pets currently. No bird or hot tub exposure. Could have mold in his current home and has lived there for 1.5 years now.    Social Drivers of Corporate investment banker  Strain: Not on file  Food Insecurity: Not on file  Transportation Needs: Not on file  Physical Activity: Not on file  Stress: Not on file  Social Connections: Not on file     Family History: The patient's family history includes Asthma in his maternal uncle; Breast cancer in his mother; Cancer in his mother and another family member; Diabetes in his father and another family member; Heart disease  in his father and another family member; Hyperlipidemia in his father; Hypertension in his father; Leukemia in his brother; Stroke in his father.  ROS:   Please see the history of present illness.     EKGs/Labs/Other Studies Reviewed:     Cardiac Studies & Procedures   ______________________________________________________________________________________________     ECHOCARDIOGRAM  ECHOCARDIOGRAM COMPLETE 11/28/2015  Narrative *[facility]* [address] [place], [place] [address] [phone]  ------------------------------------------------------------------- Transthoracic Echocardiography  Patient:    Lucien, Budney MR #:       [id number] Study Date: 11/28/2015 Gender:     M Age:        55 Height:     181.6 cm Weight:     130.6 kg BSA:        2.62 m^2 Pt. Status: Room:  SONOGRAPHER  Shanda Boers, RDCS PERFORMING   [facility], Outpatient ATTENDING    Noreen Tonnie BRAVO ORDERING     Noreen Tonnie BRAVO REFERRING    Noreen Tonnie E  cc:  ------------------------------------------------------------------- LV EF: 55% -   60%  ------------------------------------------------------------------- Indications:      (R06.00). DEFINITY .  ------------------------------------------------------------------- History:   PMH:  Acquired from the patient and from the patient&'s chart.  Cough and bilateral lower extremity edema.  Risk factors: Hypertension. Diabetes mellitus. Morbidly obese.  Dyslipidemia.  ------------------------------------------------------------------- Study Conclusions  - Left ventricle: The cavity size was normal. There was mild focal basal hypertrophy of the septum. Systolic function was normal. The estimated ejection fraction was in the range of 55% to 60%. Wall motion was normal; there were no regional wall motion abnormalities. Doppler parameters are consistent with abnormal left ventricular relaxation (grade 1 diastolic dysfunction).  Impressions:  - Technically difficult; definity  used; normal LV systolic function; grade 1 diastolic dysfunction.  ------------------------------------------------------------------- Labs, prior tests, procedures, and surgery: Catheterization.    The study demonstrated coronary artery disease. There was a stenosis which was treated with a stent.  ------------------------------------------------------------------- Study data:  No prior study was available for comparison.  Study status:  Routine.  Procedure:  The patient reported no pain pre or post test. Transthoracic echocardiography for diagnosis, for left ventricular function evaluation, for right ventricular function evaluation, and for assessment of valvular function. Image quality was adequate. The study was technically difficult, as a result of poor sound wave transmission and body habitus. Intravenous contrast (Definity ) was administered to opacify the LV.  Study completion: There were no complications.          Transthoracic echocardiography.  M-mode, complete 2D, spectral Doppler, and color Doppler.  Birthdate:  Patient birthdate: 08-Feb-1961.  Age:  Patient is 63 yr old.  Sex:  Gender: male.    BMI: 39.6 kg/m^2.  Blood pressure:     137/84  Patient status:  Outpatient.  Study date: Study date: 11/28/2015. Study time: 07:26 AM.  Location:  [facility]  -------------------------------------------------------------------  ------------------------------------------------------------------- Left ventricle:  The cavity size was normal. There was mild focal basal hypertrophy of the septum. Systolic function was normal. The estimated ejection fraction was in the range of 55% to 60%. Wall motion was normal; there were no regional wall motion abnormalities. Doppler parameters are consistent with abnormal left ventricular relaxation (grade 1 diastolic dysfunction).  ------------------------------------------------------------------- Aortic valve:   Trileaflet; mildly calcified leaflets. Mobility was not restricted.  Doppler:  Transvalvular velocity was within the normal range. There was no stenosis. There was no regurgitation.  ------------------------------------------------------------------- Aorta:  Aortic root: The aortic root was normal in  size.  ------------------------------------------------------------------- Mitral valve:   Structurally normal valve.   Mobility was not restricted.  Doppler:  Transvalvular velocity was within the normal range. There was no evidence for stenosis. There was no regurgitation.    Peak gradient (D): 2 mm Hg.  ------------------------------------------------------------------- Left atrium:  The atrium was normal in size.  ------------------------------------------------------------------- Right ventricle:  The cavity size was normal. Systolic function was normal.  ------------------------------------------------------------------- Pulmonic valve:    Doppler:  Transvalvular velocity was within the normal range. There was no evidence for stenosis.  ------------------------------------------------------------------- Tricuspid valve:   Structurally normal valve.    Doppler: Transvalvular velocity was within the normal range. There was  no regurgitation.  ------------------------------------------------------------------- Right atrium:  The atrium was normal in size.  ------------------------------------------------------------------- Pericardium:  There was no pericardial effusion.  ------------------------------------------------------------------- Systemic veins: Inferior vena cava: The vessel was normal in size.  ------------------------------------------------------------------- Measurements  Left ventricle                           Value        Reference LV ID, ED, PLAX chordal        (L)       38.6  mm     43 - 52 LV ID, ES, PLAX chordal                  27.9  mm     23 - 38 LV fx shortening, PLAX chordal (L)       28    %      >=29 LV PW thickness, ED                      9.21  mm     --------- IVS/LV PW ratio, ED            (H)       1.5          <=1.3 Stroke volume, 2D                        110   ml     --------- Stroke volume/bsa, 2D                    42    ml/m^2 --------- LV e&', lateral                           10.2  cm/s   --------- LV E/e&', lateral                         7.21         --------- LV e&', medial                            6.69  cm/s   --------- LV E/e&', medial                          10.99        --------- LV e&', average                           8.45  cm/s   --------- LV E/e&', average  8.7          ---------  Ventricular septum                       Value        Reference IVS thickness, ED                        13.8  mm     ---------  LVOT                                     Value        Reference LVOT ID, S                               24    mm     --------- LVOT area                                4.52  cm^2   --------- LVOT ID                                  24    mm     --------- LVOT peak velocity, S                    116   cm/s   --------- LVOT mean velocity, S                    75.6  cm/s   --------- LVOT VTI, S                               24.4  cm     --------- LVOT peak gradient, S                    5     mm Hg  --------- Stroke volume (SV), LVOT DP              110.4 ml     --------- Stroke index (SV/bsa), LVOT DP           42.1  ml/m^2 ---------  Aorta                                    Value        Reference Aortic root ID, ED                       30    mm     ---------  Left atrium                              Value        Reference LA ID, A-P, ES                           41    mm     --------- LA ID/bsa, A-P  1.56  cm/m^2 <=2.2 LA volume, S                             31    ml     --------- LA volume/bsa, S                         11.8  ml/m^2 --------- LA volume, ES, 1-p A4C                   38    ml     --------- LA volume/bsa, ES, 1-p A4C               14.5  ml/m^2 --------- LA volume, ES, 1-p A2C                   24    ml     --------- LA volume/bsa, ES, 1-p A2C               9.2   ml/m^2 ---------  Mitral valve                             Value        Reference Mitral E-wave peak velocity              73.5  cm/s   --------- Mitral A-wave peak velocity              66.6  cm/s   --------- Mitral deceleration time                 225   ms     150 - 230 Mitral peak gradient, D                  2     mm Hg  --------- Mitral E/A ratio, peak                   1.1          ---------  Right ventricle                          Value        Reference RV s&', lateral, S                        11.4  cm/s   ---------  Legend: (L)  and  (H)  mark values outside specified reference range.  ------------------------------------------------------------------- Prepared and Electronically Authenticated by  Redell Shallow 2017-10-16T09:58:56   TEE  ECHO TEE 03/17/2019  Narrative TRANSESOPHOGEAL ECHO REPORT    Patient Name:   JUSTINO BOZE Brannigan Date of Exam: 03/17/2019 Medical Rec #:  [id number]       Height:       71.0 in Accession #:    [id number]      Weight:       326.7 lb Date of  Birth:  Mar 11, 1960       BSA:          2.60 m Patient Age:    58 years        BP:           145/71 mmHg Patient Gender: M               HR:  73 bpm. Exam Location:  Inpatient   Procedure: Transesophageal Echo, Color Doppler and Cardiac Doppler  Indications:     Bacteremia  History:         Patient has prior history of Echocardiogram examinations, most recent 11/28/2015.  Sonographer:     Damien Senior RDCS Referring Phys:  [id number] ROANNE GOODELL Diagnosing Phys: Annabella Scarce MD    PROCEDURE: Normal Transesophogeal exam. No source of intracardiac mass. No evidence of intracardiac thrombus. No evidence of vegetation. Consent was requested emergently by emergency room physicain. After discussion of the risks and benefits of a TEE, an informed consent was obtained from the patient. Local oropharyngeal anesthetic was provided with viscous lidocaine . Patients was monitored while under deep sedation. The transesophogeal probe was passed through the esophogus of the patient. Imaged were obtained with the patient in a left lateral decubitus position. Image quality was excellent. The patient's vital signs; including heart rate, blood pressure, and oxygen saturation; remained stable throughout the procedure. The patient developed no complications during the procedure.  IMPRESSIONS   1. Left ventricular ejection fraction, by visual estimation, is 60 to 65%. The left ventricle has normal function. Left ventricular septal wall thickness was normal. Normal left ventricular posterior wall thickness. There is no left ventricular hypertrophy. 2. The left ventricle has no regional wall motion abnormalities. 3. Global right ventricle has normal systolic function.The right ventricular size is normal. No increase in right ventricular wall thickness. 4. Left atrial size was normal. 5. Right atrial size was normal. 6. The mitral valve is normal in structure. Mild mitral valve regurgitation. No  evidence of mitral stenosis. 7. No trisuspid valve vegetation visualized. 8. The tricuspid valve is normal in structure. 9. The tricuspid valve is normal in structure. Tricuspid valve regurgitation is not demonstrated. 10. The aortic valve is normal in structure. Aortic valve regurgitation is not visualized. No evidence of aortic valve sclerosis or stenosis. 11. The pulmonic valve was normal in structure. Pulmonic valve regurgitation is not visualized. 12. The inferior vena cava is normal in size with greater than 50% respiratory variability, suggesting right atrial pressure of 3 mmHg.  FINDINGS Left Ventricle: Left ventricular ejection fraction, by visual estimation, is 60 to 65%. The left ventricle has normal function. The left ventricle has no regional wall motion abnormalities. Left ventricular septal wall thickness was normal. Normal left ventricular posterior wall thickness. There is no left ventricular hypertrophy.  Right Ventricle: The right ventricular size is normal. No increase in right ventricular wall thickness. Global RV systolic function is has normal systolic function.  Left Atrium: Left atrial size was normal in size.  Right Atrium: Right atrial size was normal in size  Pericardium: There is no evidence of pericardial effusion.  Mitral Valve: The mitral valve is normal in structure. No evidence of mitral valve stenosis by observation. Mild mitral valve regurgitation.  Tricuspid Valve: The tricuspid valve is normal in structure. Tricuspid valve regurgitation is not demonstrated. No TV vegetation was visualized.  Aortic Valve: The aortic valve is normal in structure. Aortic valve regurgitation is not visualized. The aortic valve is structurally normal, with no evidence of sclerosis or stenosis.  Pulmonic Valve: The pulmonic valve was normal in structure. Pulmonic valve regurgitation is not visualized.  Aorta: The aortic root, ascending aorta and aortic arch are all  structurally normal, with no evidence of dilitation or obstruction.  Venous: The inferior vena cava is normal in size with greater than 50% respiratory variability, suggesting right atrial pressure of 3 mmHg.  Shunts: There is no evidence of a patent foramen ovale. No ventricular septal defect is seen or detected. There is no evidence of an atrial septal defect. No atrial level shunt detected by color flow Doppler.   Annabella Scarce MD Electronically signed by Annabella Scarce MD Signature Date/Time: 03/17/2019/12:51:08 PM    Final        ______________________________________________________________________________________________       Recent Labs: 05/21/2023: ALT 15; Hemoglobin 14.9; Platelets 355; TSH 2.910  Recent Lipid Panel    Component Value Date/Time   CHOL 143 05/21/2023 1122   TRIG 97 05/21/2023 1122   HDL 47 05/21/2023 1122   CHOLHDL 3.0 05/21/2023 1122   CHOLHDL 4.1 04/27/2010 0035   VLDL 29 04/27/2010 0035   LDLCALC 78 05/21/2023 1122    Physical Exam:    VS:  BP 104/68 (BP Location: Left Arm, Patient Position: Sitting, Cuff Size: Large)   Pulse 78   Resp 16   Ht 5' 10 (1.778 m)   Wt 251 lb 9.6 oz (114.1 kg)   SpO2 95%   BMI 36.10 kg/m     Wt Readings from Last 3 Encounters:  10/22/23 251 lb 9.6 oz (114.1 kg)  09/05/23 251 lb (113.9 kg)  07/25/23 256 lb (116.1 kg)    GEN: NAD, Morbid obesity HEENT: Normal NECK: No JVD CARDIAC: RRR, no murmurs, rubs, gallops RESPIRATORY:  Clear to auscultation without rales, wheezing or rhonchi  ABDOMEN: Soft, non-tender, non-distended MUSCULOSKELETAL:  No edema; No deformity  SKIN: Warm and dry NEUROLOGIC:  Alert and oriented x 3 PSYCHIATRIC:  Normal affect   ASSESSMENT/PLAN:    Coronary artery disease, status post LAD STEMI and PCI Status post LAD STEMI with PCI in 2012. Currently asymptomatic with well-controlled blood pressure. - Continue current medications, including aspirin  due to prior stent  placement despite easy bruising. - Monitor blood pressure regularly. - Reassess medication regimen if blood pressure consistently decreases.  Peripheral arterial disease with prior toe amputations - Currently asymptomatic and under podiatry care.  Type 2 diabetes mellitus, improved glycemic control Significant improvement in glycemic control with A1c reduced from 8.5% to 5.9% over five months, aided by weight loss. - Continue current management and lifestyle changes.  Hypertension, well-controlled Blood pressure readings are generally within 125-140/80 mmHg. Current medications are effective. - Continue current antihypertensive regimen. - Monitor blood pressure regularly. - Consider reducing antihypertensive medication if blood pressure consistently decreases.  Dyslipidemia, LDL above goal LDL cholesterol at 78 mg/dL, slightly above target of <70 mg/dL for coronary artery disease. Progress with lifestyle changes. - Continue current lipid-lowering therapy. - Encourage continued lifestyle modifications to further reduce LDL.  Easy bruising, likely medication-related Easy bruising possibly related to aspirin  use, which is necessary due to prior stent placement. - Continue aspirin  therapy.  Longitudinal care: The evaluation and management services provided today reflect the complexity inherent in caring for this patient, including the ongoing longitudinal relationship and management of multiple chronic conditions and/or the need for care coordination. The visit required a comprehensive assessment and management plan tailored to the patient's unique needs Time was spent addressing not only the acute concerns but also the broader context of the patient's health, including preventive care, chronic disease management, and care coordination as appropriate.  Complex longitudinal is necessary for conditions including: longitudinal secondary prevention; he has made significant strides and looks great  today  One year with me or my team  Stanly Leavens, MD FASE [facility] Cardiologist [facility]  [facility]  [phone] [place]  [address] [place], [place] [address] [phone]  5:23 PM

## 2023-10-23 DIAGNOSIS — E103511 Type 1 diabetes mellitus with proliferative diabetic retinopathy with macular edema, right eye: Secondary | ICD-10-CM | POA: Diagnosis not present

## 2023-10-23 DIAGNOSIS — H43823 Vitreomacular adhesion, bilateral: Secondary | ICD-10-CM | POA: Diagnosis not present

## 2023-10-23 DIAGNOSIS — E103411 Type 1 diabetes mellitus with severe nonproliferative diabetic retinopathy with macular edema, right eye: Secondary | ICD-10-CM | POA: Diagnosis not present

## 2023-10-23 DIAGNOSIS — H1045 Other chronic allergic conjunctivitis: Secondary | ICD-10-CM | POA: Diagnosis not present

## 2023-10-28 ENCOUNTER — Ambulatory Visit (INDEPENDENT_AMBULATORY_CARE_PROVIDER_SITE_OTHER): Payer: Self-pay | Admitting: Family Medicine

## 2023-10-28 ENCOUNTER — Encounter (INDEPENDENT_AMBULATORY_CARE_PROVIDER_SITE_OTHER): Payer: Self-pay | Admitting: Family Medicine

## 2023-10-28 VITALS — BP 116/74 | HR 69 | Temp 98.2°F | Ht 70.0 in | Wt 249.0 lb

## 2023-10-28 DIAGNOSIS — I152 Hypertension secondary to endocrine disorders: Secondary | ICD-10-CM

## 2023-10-28 DIAGNOSIS — I25118 Atherosclerotic heart disease of native coronary artery with other forms of angina pectoris: Secondary | ICD-10-CM

## 2023-10-28 DIAGNOSIS — E1169 Type 2 diabetes mellitus with other specified complication: Secondary | ICD-10-CM

## 2023-10-28 DIAGNOSIS — Z6837 Body mass index (BMI) 37.0-37.9, adult: Secondary | ICD-10-CM

## 2023-10-28 DIAGNOSIS — E1159 Type 2 diabetes mellitus with other circulatory complications: Secondary | ICD-10-CM

## 2023-10-28 DIAGNOSIS — Z7984 Long term (current) use of oral hypoglycemic drugs: Secondary | ICD-10-CM

## 2023-10-28 DIAGNOSIS — E669 Obesity, unspecified: Secondary | ICD-10-CM

## 2023-10-28 DIAGNOSIS — Z6835 Body mass index (BMI) 35.0-35.9, adult: Secondary | ICD-10-CM

## 2023-10-28 DIAGNOSIS — E66813 Obesity, class 3: Secondary | ICD-10-CM

## 2023-10-28 DIAGNOSIS — Z7985 Long-term (current) use of injectable non-insulin antidiabetic drugs: Secondary | ICD-10-CM

## 2023-10-28 DIAGNOSIS — E559 Vitamin D deficiency, unspecified: Secondary | ICD-10-CM

## 2023-10-28 MED ORDER — VITAMIN D (ERGOCALCIFEROL) 1.25 MG (50000 UNIT) PO CAPS: ORAL_CAPSULE | ORAL | 0 refills | Status: DC

## 2023-10-28 MED ORDER — GLIMEPIRIDE 1 MG PO TABS: ORAL_TABLET | ORAL | Status: AC

## 2023-10-28 NOTE — Progress Notes (Signed)
 [name], D.O.  ABFM, ABOM Specializing in Clinical Bariatric Medicine  Office located at: [address] [place], [place]  [address]   Assessment and Plan:   Medications Discontinued During This Encounter  Medication Reason   Cholecalciferol  (VITAMIN D ) 50 MCG (2000 UT) tablet    Vitamin D , Ergocalciferol , (DRISDOL ) 1.25 MG (50000 UNIT) CAPS capsule Reorder   glimepiride  (AMARYL ) 1 MG tablet      Meds ordered this encounter  Medications   glimepiride  (AMARYL ) 1 MG tablet    Sig: 1/2 tab bid or less- cont to ween as tolerated    Given to pt by PCP   Vitamin D , Ergocalciferol , (DRISDOL ) 1.25 MG (50000 UNIT) CAPS capsule    Sig: 1 po q 2 wks    Dispense:  6 capsule    Refill:  0     FOR THE DISEASE OF OBESITY:  BMI 37.0-37.9, adult -- Current BMI 35.73 Obesity, Start BMI 39.17 Assessment & Plan: Since last office visit on 09/05/2023 patient's muscle mass has decreased by 1.2 lbs. Fat mass has decreased by 1.4 lbs. Total body water  has increased by 1 lbs.  Body fat % has decreased by 0.1  %. Counseling done on how various foods will affect these numbers and how to maximize success  Total lbs lost to date: -24 lbs Total weight loss percentage to date: -8.49 %   Recommended Dietary Goals [name] is currently in the action stage of change. As such, his goal is to continue weight management plan.  He has agreed to: continue current plan   Behavioral Intervention We discussed the following today: increasing lean protein intake to established goals, decreasing simple carbohydrates , and keeping healthy foods at home  Additional resources provided today: Handout on Common Characteristics of Successful Weight Losers and Maintainers   Evidence-based interventions for health behavior change were utilized today including the discussion of self monitoring techniques, problem-solving barriers and SMART goal setting techniques.   Regarding patient's less desirable eating  habits and patterns, we employed the technique of small changes.   Goal(s) for next OV: n/a    Recommended Physical Activity Goals [name] has been advised to work up to 300-450 minutes of moderate intensity aerobic activity a week and strengthening exercises 2-3 times per week for cardiovascular health, weight loss maintenance and preservation of muscle mass.   He has agreed to return to the gym 3 days a week and continue golfing.   Pharmacotherapy Cont same regimen.   ASSOCIATED CONDITIONS ADDRESSED TODAY:   Type 2 diabetes mellitus with other specified complication, without long-term current use of insulin  [facility]) Assessment & Plan: Pt is on a regimen of Jardiance 25 mg daily, Metformin XR 1,000 mg twice daily, Mounjaro  15 mg weekly, and Glimepiride  1/2 tab BID. States his average fasting blood sugar is 115. At times, his blood sugars become elevated (213) after eating chocolate candies but he does not do this often. Overall, his hunger and cravings are controlled. Pt had labs with his PCP on 8/26 which we reviewed. A1c reduced from 8.5 to 5.9 over 5 months. Kidney function was elevated; pt was encouraged to adequately hydrate. He will cont all anti-diabetic medications and cont to wean off glimepiride . Cont to decrease simple carbs/ sugars; increase fiber and proteins.    Coronary artery disease of native artery of native heart with stable angina pectoris (HCC) - h/o acute myocardial infarction/ Hypertension associated with type 2 diabetes mellitus (HCC) Assessment & Plan: Last 3  blood pressure readings in our office are as follows: BP Readings from Last 3 Encounters:  10/28/23 116/74  10/22/23 104/68  09/05/23 121/73   The ASCVD Risk score (Arnett DK, et al., 2019) failed to calculate for the following reasons:   Risk score cannot be calculated because patient has a medical history suggesting prior/existing ASCVD  Lab Results  Component Value Date   CREATININE 1.67 (H)  10/11/2022   He has a history of coronary artery disease, having undergone a PCI in 2012 following an LAD and STEMI event. BP at goal today; pt has also been checking his BP at home and states it has been at goal. Pt completely asymptomatic; denies any new cardiac symptoms. Cont Aspirin  ,Carvedilol  6.25 mg BID, Norvasc  10 mg daily, and Cozaar  50 mg twice daily. Cont low sodium heart healthy meal plan. F/up with cardiology as directed.    Vitamin D  deficiency Assessment & Plan: Last labs obtained by PCP showed vit D of 42.7 on 07/09/23. Optimal level: 50 to 70. Pt taking ERGO 50K units once every 2 weeks. Good compliance/tolerance. No nausea, muscle cramps, or other adverse SE. No acute concerns. Cont supplementation (refill today) and wt loss efforts. Recheck; future.     Follow up:   Return 12/12/2023 at 9:20 AM.  He was informed of the importance of frequent follow up visits to maximize his success with intensive lifestyle modifications for his multiple health conditions.   Subjective:    Chief complaint: Obesity [name] is here to discuss his progress with his obesity treatment plan. He is on the Category 2 Plan  w B/L options and states he is following his eating plan approximately 60% of the time. Pt is golfing 4 hours 1 day per week   Interval History:  [name] is here for a follow up office visit.    Pt has experienced a weight loss of 3 lbs since last OV on 09/05/2023.    His dietary habits include:   - Tracking Calories/Macros: no  - Eating More Whole Foods: yes  - Adequate Protein Intake: yes   - Adequate Water  Intake: no  - Skipping Meals: yes   - Sleeping 7-9 Hours/ Night: no    Pharmacotherapy that aid with weight loss: He is currently taking Metformin XR 1,000 mg twice daily and max dose weekly Mounjaro .   Review of Systems:  Pertinent positives were addressed with patient today.  Reviewed by clinician on day of visit: allergies, medications,  problem list, medical history, surgical history, family history, social history, and previous encounter notes.  Weight Summary and Biometrics   Weight Lost Since Last Visit: 2lb  Weight Gained Since Last Visit: 0lb   Vitals Temp: 98.2 F (36.8 C) BP: 116/74 Pulse Rate: 69 SpO2: 98 %   Anthropometric Measurements Height: 5' 10 (1.778 m) Weight: 249 lb (112.9 kg) BMI (Calculated): 35.73 Weight at Last Visit: 251lb Weight Lost Since Last Visit: 2lb Weight Gained Since Last Visit: 0lb Starting Weight: 273lb Total Weight Loss (lbs): 24 lb (10.9 kg) Peak Weight: 330lb   Body Composition  Body Fat %: 38.4 % Fat Mass (lbs): 95.6 lbs Muscle Mass (lbs): 146 lbs Total Body Water  (lbs): 115.8 lbs Visceral Fat Rating : 22   Other Clinical Data Fasting: yes Labs: no Today's Visit #: 6 Starting Date: 05/21/23    Objective:   PHYSICAL EXAM: Blood pressure 116/74, pulse 69, temperature 98.2 F (36.8 C), height 5' 10 (1.778 m), weight 249 lb (112.9 kg),  SpO2 98%. Body mass index is 35.73 kg/m.  General: he is overweight, cooperative and in no acute distress. PSYCH: Has normal mood, affect and thought process.   HEENT: EOMI, sclerae are anicteric. Lungs: Normal breathing effort, no conversational dyspnea. Extremities: Moves * 4 Neurologic: A and O * 3, good insight  DIAGNOSTIC DATA REVIEWED: BMET    Component Value Date/Time   NA 137 10/11/2022 0950   K 4.8 10/11/2022 0950   CL 100 10/11/2022 0950   CO2 22 10/11/2022 0950   GLUCOSE 182 (H) 10/11/2022 0950   GLUCOSE 156 (H) 11/17/2019 1440   BUN 25 10/11/2022 0950   CREATININE 1.67 (H) 10/11/2022 0950   CREATININE 1.30 05/18/2019 1122   CALCIUM  9.5 10/11/2022 0950   GFRNONAA 67 03/14/2020 1446   GFRNONAA 61 05/18/2019 1122   GFRAA 77 03/14/2020 1446   GFRAA 71 05/18/2019 1122   Lab Results  Component Value Date   HGBA1C 6.3 (H) 10/05/2021   HGBA1C (H) 04/26/2010    11.9 (NOTE)                                                                        According to the ADA Clinical Practice Recommendations for 2011, when HbA1c is used as a screening test:   >=6.5%   Diagnostic of Diabetes Mellitus           (if abnormal result  is confirmed)  5.7-6.4%   Increased risk of developing Diabetes Mellitus  References:Diagnosis and Classification of Diabetes Mellitus,Diabetes Care,2011,34(Suppl 1):S62-S69 and Standards of Medical Care in         Diabetes - 2011,Diabetes Care,2011,34  (Suppl 1):S11-S61.   Lab Results  Component Value Date   INSULIN  12.4 05/21/2023   INSULIN  28.9 (H) 09/01/2019   Lab Results  Component Value Date   TSH 2.910 05/21/2023   CBC    Component Value Date/Time   WBC 10.4 05/21/2023 1122   WBC 7.3 11/17/2019 1440   RBC 4.92 05/21/2023 1122   RBC 4.73 11/17/2019 1440   HGB 14.9 05/21/2023 1122   HCT 44.2 05/21/2023 1122   PLT 355 05/21/2023 1122   MCV 90 05/21/2023 1122   MCH 30.3 05/21/2023 1122   MCH 30.0 11/17/2019 1440   MCHC 33.7 05/21/2023 1122   MCHC 33.6 11/17/2019 1440   RDW 12.4 05/21/2023 1122   Iron Studies No results found for: IRON, TIBC, FERRITIN, IRONPCTSAT Lipid Panel     Component Value Date/Time   CHOL 143 05/21/2023 1122   TRIG 97 05/21/2023 1122   HDL 47 05/21/2023 1122   CHOLHDL 3.0 05/21/2023 1122   CHOLHDL 4.1 04/27/2010 0035   VLDL 29 04/27/2010 0035   LDLCALC 78 05/21/2023 1122   Hepatic Function Panel     Component Value Date/Time   PROT 7.2 05/21/2023 1122   ALBUMIN 4.6 05/21/2023 1122   AST 13 05/21/2023 1122   ALT 15 05/21/2023 1122   ALKPHOS 104 05/21/2023 1122   BILITOT 0.9 05/21/2023 1122   BILIDIR 0.30 05/21/2023 1122      Component Value Date/Time   TSH 2.910 05/21/2023 1122   Nutritional Lab Results  Component Value Date   VD25OH 39.3 05/21/2023   VD25OH 73.1 10/05/2021   VD25OH  83.5 02/22/2021    Attestations:   I, Special Puri, acting as a Stage manager for [employer], DO., have compiled  all relevant documentation for today's office visit on behalf of James Jenkins, DO, while in the presence of [employer], DO.  I have reviewed the above documentation for accuracy and completeness, and I agree with the above. James JINNY Ross, D.O.  The 21st Century Cures Act was signed into law in 2016 which includes the topic of electronic health records.  This provides immediate access to information in MyChart.  This includes consultation notes, operative notes, office notes, lab results and pathology reports.  If you have any questions about what you read please let us  know at your next visit so we can discuss your concerns and take corrective action if need be.  We are right here with you.

## 2023-11-13 ENCOUNTER — Ambulatory Visit: Payer: Self-pay | Admitting: Internal Medicine

## 2023-11-13 DIAGNOSIS — L539 Erythematous condition, unspecified: Secondary | ICD-10-CM | POA: Diagnosis not present

## 2023-11-13 DIAGNOSIS — E11621 Type 2 diabetes mellitus with foot ulcer: Secondary | ICD-10-CM | POA: Diagnosis not present

## 2023-11-13 DIAGNOSIS — L97519 Non-pressure chronic ulcer of other part of right foot with unspecified severity: Secondary | ICD-10-CM | POA: Diagnosis not present

## 2023-11-22 ENCOUNTER — Other Ambulatory Visit: Payer: Self-pay

## 2023-11-26 ENCOUNTER — Telehealth: Payer: Self-pay | Admitting: Internal Medicine

## 2023-11-26 MED ORDER — AMLODIPINE BESYLATE 10 MG PO TABS: 10.0000 mg | ORAL_TABLET | Freq: Every day | ORAL | 3 refills | Status: AC

## 2023-11-26 NOTE — Telephone Encounter (Signed)
 RX sent in

## 2023-11-26 NOTE — Telephone Encounter (Signed)
*  STAT* If patient is at the pharmacy, call can be transferred to refill team.   1. Which medications need to be refilled? (please list name of each medication and dose if known) amLODipine (NORVASC) 10 MG tablet   2. Which pharmacy/location (including street and city if local pharmacy) is medication to be sent to?  [place]    3. Do they need a 30 day or 90 day supply? 90 day

## 2023-11-27 DIAGNOSIS — E11621 Type 2 diabetes mellitus with foot ulcer: Secondary | ICD-10-CM | POA: Diagnosis not present

## 2023-11-27 DIAGNOSIS — L97519 Non-pressure chronic ulcer of other part of right foot with unspecified severity: Secondary | ICD-10-CM | POA: Diagnosis not present

## 2023-11-28 ENCOUNTER — Ambulatory Visit (INDEPENDENT_AMBULATORY_CARE_PROVIDER_SITE_OTHER): Admitting: Family Medicine

## 2023-12-05 ENCOUNTER — Other Ambulatory Visit (HOSPITAL_COMMUNITY): Payer: Self-pay

## 2023-12-10 DIAGNOSIS — E1169 Type 2 diabetes mellitus with other specified complication: Secondary | ICD-10-CM | POA: Diagnosis not present

## 2023-12-10 DIAGNOSIS — E113411 Type 2 diabetes mellitus with severe nonproliferative diabetic retinopathy with macular edema, right eye: Secondary | ICD-10-CM | POA: Diagnosis not present

## 2023-12-10 DIAGNOSIS — I10 Essential (primary) hypertension: Secondary | ICD-10-CM | POA: Diagnosis not present

## 2023-12-10 DIAGNOSIS — G4733 Obstructive sleep apnea (adult) (pediatric): Secondary | ICD-10-CM | POA: Diagnosis not present

## 2023-12-12 ENCOUNTER — Encounter (INDEPENDENT_AMBULATORY_CARE_PROVIDER_SITE_OTHER): Payer: Self-pay

## 2023-12-12 ENCOUNTER — Encounter (INDEPENDENT_AMBULATORY_CARE_PROVIDER_SITE_OTHER): Payer: Self-pay | Admitting: Family Medicine

## 2023-12-12 ENCOUNTER — Ambulatory Visit (INDEPENDENT_AMBULATORY_CARE_PROVIDER_SITE_OTHER): Admitting: Family Medicine

## 2023-12-12 ENCOUNTER — Other Ambulatory Visit: Payer: Self-pay | Admitting: Internal Medicine

## 2023-12-12 VITALS — BP 105/66 | HR 74 | Temp 98.1°F | Ht 70.0 in | Wt 243.0 lb

## 2023-12-12 DIAGNOSIS — E1169 Type 2 diabetes mellitus with other specified complication: Secondary | ICD-10-CM

## 2023-12-12 DIAGNOSIS — Z6841 Body Mass Index (BMI) 40.0 and over, adult: Secondary | ICD-10-CM

## 2023-12-12 DIAGNOSIS — Z7984 Long term (current) use of oral hypoglycemic drugs: Secondary | ICD-10-CM

## 2023-12-12 DIAGNOSIS — E65 Localized adiposity: Secondary | ICD-10-CM

## 2023-12-12 DIAGNOSIS — I152 Hypertension secondary to endocrine disorders: Secondary | ICD-10-CM | POA: Diagnosis not present

## 2023-12-12 DIAGNOSIS — E559 Vitamin D deficiency, unspecified: Secondary | ICD-10-CM

## 2023-12-12 DIAGNOSIS — Z7985 Long-term (current) use of injectable non-insulin antidiabetic drugs: Secondary | ICD-10-CM

## 2023-12-12 DIAGNOSIS — Z6837 Body mass index (BMI) 37.0-37.9, adult: Secondary | ICD-10-CM

## 2023-12-12 DIAGNOSIS — E1159 Type 2 diabetes mellitus with other circulatory complications: Secondary | ICD-10-CM

## 2023-12-12 DIAGNOSIS — Z6834 Body mass index (BMI) 34.0-34.9, adult: Secondary | ICD-10-CM

## 2023-12-12 DIAGNOSIS — E669 Obesity, unspecified: Secondary | ICD-10-CM

## 2023-12-12 NOTE — Progress Notes (Signed)
 [name], D.O.  ABFM, ABOM Specializing in Clinical Bariatric Medicine  Office located at: [address] [place], [place]  [address]    Obtain A1C, CMP, Vit D, B12 labs  A) FOR THE CHRONIC DISEASE OF OBESITY:  Chief complaint: Obesity [name] is here to discuss his progress with his obesity treatment plan.   History of present illness / Interval history:  [name] is here today for his follow-up office visit.  Since last OV on 10/28/23, pt is down 6 lbs. Pt states that he feels like he has slowed down a little bit. He recently went to a wedding. 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 He endorses drinking about 65 oz of water  a day.      10/28/23 09:00 12/12/23 09:00   Body Fat % 38.4 % 37.4 %  Muscle Mass (lbs) 146 lbs 144.8 lbs  Fat Mass (lbs) 95.6 lbs 91.2 lbs  Total Body Water  (lbs) 115.8 lbs 114.2 lbs  Visceral Fat Rating  22 21    Counseling done on how various foods will affect these numbers and how to maximize success   Total lbs lost to date: - 30 lbs Total Fat Mass in lbs lost to date: - 1 lbs Total weight loss percentage to date: -10.99 %    Obesity, Start BMI 39.17 BMI 37.0-37.9, adult -- Current BMI 34.87 Nutrition Therapy He is on the Category 2 Plan w B/L options and states he is following his eating plan approximately 70 % of the time.   - Tracking Calories/Macros: no   - Eating More Whole Foods: yes  - Adequate Protein Intake: yes  - Adequate Water  Intake: no   - Skipping Meals: no   - Sleeping 7-9 Hours/ Night: no - usually sleeps 5-6 hours   [name] is currently in the action stage of change. As such, his goal is to continue weight management plan.  He has agreed to: continue current plan   Physical Activity [name] is playing golf 180   minutes 1 days per week   [name] has been advised to work up to 300-450 minutes of moderate intensity aerobic activity a week and strengthening exercises  2-3 times per week for cardiovascular health, weight loss maintenance and preservation of muscle mass.  He has agreed to : Think about enjoyable ways to increase daily physical activity and overcoming barriers to exercise and Start strengthening exercises with a goal of 2-3 sessions a week    Behavioral Modifications Evidence-based interventions for health behavior change were utilized today including the discussion of  1) self monitoring techniques:    - Be mindful of daily activity   2) SMART goals for next OV:    - Go to the gym 3 days a week   Regarding patient's less desirable eating habits and patterns, we employed the technique of small changes.   We discussed the following today: avoiding skipping meals, keeping healthy foods at home, and planning for success Additional resources provided today: None   Medical Interventions/ Pharmacotherapy Previous Bariatric surgery: n/a Pharmacotherapy for weight loss: He is currently taking Metformin XR 1000 mg twice daily and Metformin 15 mg weekly for medical weight loss.    We discussed various medication options to help [name] with his weight loss efforts and we both agreed to : Adequate clinical response to anti-obesity medication, continue current regimen   B) OBESITY RELATED CONDITIONS ADDRESSED TODAY:  Type 2  diabetes mellitus with other specified complication, without long-term current use of insulin  [facility]) Assessment & Plan Pt is on Jardiance 25 mg daily, Metformin XR 1,000 mg twice daily, Mounjaro  15 mg weekly, and Glimepiride  once daily. Good compliance and tolerance. Pt states that he will only be on Mounjaro  until the end of the year because his insurance will change and might not be covered for Mounjaro . Previous A1C on 05/03/23 was 5.8 and on 10/08/23 A1C increased to 5.9. Pt endorses checking his blood sugar levels in the morning and they would be up to 150. Pt states the discontinued use of glimepiride  but restarted back when he  noticed high blood glucose levels and is currently taking 1 tablet in the morning since last week. Continue with medications and decreasing simple carbs and sugars. Will obtain labs as medically necessary.     Hypertension associated with type 2 diabetes mellitus [facility]) Assessment & Plan BP Readings from Last 3 Encounters:  12/12/23 105/66  10/28/23 116/74  10/22/23 104/68   The ASCVD Risk score (Arnett DK, et al., 2019) failed to calculate for the following reasons:   Risk score cannot be calculated because patient has a medical history suggesting prior/existing ASCVD  Lab Results  Component Value Date   CREATININE 1.67 (H) 10/11/2022   On Carvedilol  6.25 mg twice daily, Norvasc  10 mg daily and Cozaar  50 mg twice daily. Pts creatinine level on 07/09/23 was 1.47. BP today is low normal. Pt denies any lightheadedness or dizziness.  Pt states that he check his Bp at home and it runs 120-135/80. Continue with medication regimen and checking BP at home.     Vitamin D  deficiency Assessment & Plan Lab Results  Component Value Date   VD25OH 39.3 05/21/2023   VD25OH 73.1 10/05/2021   VD25OH 83.5 02/22/2021   Taking ERGO 50K units once every 2 weeks. Good compliance and tolerance. Last Vit D levels on 07/09/23 were 42.7. Goal levels are between 50 and 70. Continue with supplementation. Will obtain labs as medically necessary.       Follow up:   Return 01/23/2024 at 9:20 AM  He was informed of the importance of frequent follow up visits to maximize his success with intensive lifestyle modifications for his multiple health conditions.   Weight Summary and Biometrics   Weight Lost Since Last Visit: 6lb  Weight Gained Since Last Visit: 0lb    Vitals Temp: 98.1 F (36.7 C) BP: 105/66 Pulse Rate: 74 SpO2: 96 %   Anthropometric Measurements Height: 5' 10 (1.778 m) Weight: 243 lb (110.2 kg) BMI (Calculated): 34.87 Weight at Last Visit: 249lb Weight Lost Since Last Visit:  6lb Weight Gained Since Last Visit: 0lb Starting Weight: 273lb Total Weight Loss (lbs): 30 lb (13.6 kg) Peak Weight: 330lb   Body Composition  Body Fat %: 37.4 % Fat Mass (lbs): 91.2 lbs Muscle Mass (lbs): 144.8 lbs Total Body Water  (lbs): 114.2 lbs Visceral Fat Rating : 21   Other Clinical Data Fasting: yes Labs: no Today's Visit #: 7 Starting Date: 05/21/23    Objective:   PHYSICAL EXAM: Blood pressure 105/66, pulse 74, temperature 98.1 F (36.7 C), height 5' 10 (1.778 m), weight 243 lb (110.2 kg), SpO2 96%. Body mass index is 34.87 kg/m.  General: he is overweight, cooperative and in no acute distress. PSYCH: Has normal mood, affect and thought process.   HEENT: EOMI, sclerae are anicteric. Lungs: Normal breathing effort, no conversational dyspnea. Extremities: Moves * 4 Neurologic: A and O *  3, good insight  DIAGNOSTIC DATA REVIEWED: BMET    Component Value Date/Time   NA 137 10/11/2022 0950   K 4.8 10/11/2022 0950   CL 100 10/11/2022 0950   CO2 22 10/11/2022 0950   GLUCOSE 182 (H) 10/11/2022 0950   GLUCOSE 156 (H) 11/17/2019 1440   BUN 25 10/11/2022 0950   CREATININE 1.67 (H) 10/11/2022 0950   CREATININE 1.30 05/18/2019 1122   CALCIUM  9.5 10/11/2022 0950   GFRNONAA 67 03/14/2020 1446   GFRNONAA 61 05/18/2019 1122   GFRAA 77 03/14/2020 1446   GFRAA 71 05/18/2019 1122   Lab Results  Component Value Date   HGBA1C 6.3 (H) 10/05/2021   HGBA1C (H) 04/26/2010    11.9 (NOTE)                                                                       According to the ADA Clinical Practice Recommendations for 2011, when HbA1c is used as a screening test:   >=6.5%   Diagnostic of Diabetes Mellitus           (if abnormal result  is confirmed)  5.7-6.4%   Increased risk of developing Diabetes Mellitus  References:Diagnosis and Classification of Diabetes Mellitus,Diabetes Care,2011,34(Suppl 1):S62-S69 and Standards of Medical Care in         Diabetes - 2011,Diabetes  Care,2011,34  (Suppl 1):S11-S61.   Lab Results  Component Value Date   INSULIN  12.4 05/21/2023   INSULIN  28.9 (H) 09/01/2019   Lab Results  Component Value Date   TSH 2.910 05/21/2023   CBC    Component Value Date/Time   WBC 10.4 05/21/2023 1122   WBC 7.3 11/17/2019 1440   RBC 4.92 05/21/2023 1122   RBC 4.73 11/17/2019 1440   HGB 14.9 05/21/2023 1122   HCT 44.2 05/21/2023 1122   PLT 355 05/21/2023 1122   MCV 90 05/21/2023 1122   MCH 30.3 05/21/2023 1122   MCH 30.0 11/17/2019 1440   MCHC 33.7 05/21/2023 1122   MCHC 33.6 11/17/2019 1440   RDW 12.4 05/21/2023 1122   Iron Studies No results found for: IRON, TIBC, FERRITIN, IRONPCTSAT Lipid Panel     Component Value Date/Time   CHOL 143 05/21/2023 1122   TRIG 97 05/21/2023 1122   HDL 47 05/21/2023 1122   CHOLHDL 3.0 05/21/2023 1122   CHOLHDL 4.1 04/27/2010 0035   VLDL 29 04/27/2010 0035   LDLCALC 78 05/21/2023 1122   Hepatic Function Panel     Component Value Date/Time   PROT 7.2 05/21/2023 1122   ALBUMIN 4.6 05/21/2023 1122   AST 13 05/21/2023 1122   ALT 15 05/21/2023 1122   ALKPHOS 104 05/21/2023 1122   BILITOT 0.9 05/21/2023 1122   BILIDIR 0.30 05/21/2023 1122      Component Value Date/Time   TSH 2.910 05/21/2023 1122   Nutritional Lab Results  Component Value Date   VD25OH 39.3 05/21/2023   VD25OH 73.1 10/05/2021   VD25OH 83.5 02/22/2021    Attestations:   I, Sonny Laroche, acting as a stage manager for Barnie Jenkins, DO., have compiled all relevant documentation for today's office visit on behalf of Barnie Jenkins, DO, while in the presence of [employer], DO.   I have reviewed the above  documentation for accuracy and completeness, and I agree with the above. Barnie JINNY Jenkins, D.O.  The 21st Century Cures Act was signed into law in 2016 which includes the topic of electronic health records.  This provides immediate access to information in MyChart.  This includes consultation  notes, operative notes, office notes, lab results and pathology reports.  If you have any questions about what you read please let us  know at your next visit so we can discuss your concerns and take corrective action if need be.  We are right here with you.

## 2023-12-26 DIAGNOSIS — F32A Depression, unspecified: Secondary | ICD-10-CM | POA: Diagnosis not present

## 2024-01-11 ENCOUNTER — Other Ambulatory Visit: Payer: Self-pay | Admitting: Internal Medicine

## 2024-01-14 ENCOUNTER — Encounter (INDEPENDENT_AMBULATORY_CARE_PROVIDER_SITE_OTHER): Payer: Self-pay | Admitting: Family Medicine

## 2024-01-14 ENCOUNTER — Ambulatory Visit (INDEPENDENT_AMBULATORY_CARE_PROVIDER_SITE_OTHER): Admitting: Family Medicine

## 2024-01-14 VITALS — BP 115/73 | HR 76 | Temp 98.4°F | Ht 70.0 in | Wt 246.0 lb

## 2024-01-14 DIAGNOSIS — E559 Vitamin D deficiency, unspecified: Secondary | ICD-10-CM

## 2024-01-14 DIAGNOSIS — E1159 Type 2 diabetes mellitus with other circulatory complications: Secondary | ICD-10-CM | POA: Diagnosis not present

## 2024-01-14 DIAGNOSIS — E66813 Obesity, class 3: Secondary | ICD-10-CM

## 2024-01-14 DIAGNOSIS — I152 Hypertension secondary to endocrine disorders: Secondary | ICD-10-CM | POA: Diagnosis not present

## 2024-01-14 DIAGNOSIS — E1169 Type 2 diabetes mellitus with other specified complication: Secondary | ICD-10-CM | POA: Diagnosis not present

## 2024-01-14 DIAGNOSIS — Z6837 Body mass index (BMI) 37.0-37.9, adult: Secondary | ICD-10-CM

## 2024-01-14 MED ORDER — VITAMIN D (ERGOCALCIFEROL) 1.25 MG (50000 UNIT) PO CAPS: ORAL_CAPSULE | ORAL | 0 refills | Status: AC

## 2024-01-14 NOTE — Progress Notes (Signed)
 [name], D.O.  ABFM, ABOM Specializing in Clinical Bariatric Medicine  Office located at: [address] [place], [place]  [address]    Obtain fasting labs at next OV  A) FOR THE CHRONIC DISEASE OF OBESITY:  Chief complaint: Obesity [name] is here to discuss his progress with his obesity treatment plan.   History of present illness / Interval history:  [name] is here today for his follow-up office visit.  Since last OV on 12/12/23, pt is up 3 lbs. Patient states that he hasn't been going to the gym because he has had a lack of motivation to go. He endorses that he feels like his clothes are fitting a little tighter.     12/12/23 09:00 01/14/24 10:00   Body Fat % 37.4 % 38 %  Muscle Mass (lbs) 144.8 lbs 145 lbs  Fat Mass (lbs) 91.2 lbs 93.6 lbs  Total Body Water  (lbs) 114.2 lbs 112.8 lbs  Visceral Fat Rating  21 22   Counseling done on how various foods will affect these numbers and how to maximize success.   Total lbs lost to date: - 27 lbs Total Fat Mass in lbs lost to date: + 1.4 lbs Total weight loss percentage to date: - 9.89 %    Obesity, Start BMI 39.17 BMI 37.0-37.9, adult -- Current BMI 35.3  Nutrition Therapy He is on  the Category 2 Plan w B/L options and states he is following his eating plan approximately 50 % of the time.   - Tracking Calories/Macros: no   - Eating More Whole Foods: yes  - Adequate Protein Intake: yes  - Adequate Water  Intake: no   - Skipping Meals: no   - Sleeping 7-9 Hours/ Night: no    [name] is currently in the action stage of change. As such, his goal is to continue weight management plan.  He has agreed to: continue current plan   Physical Activity Chad is not exercising.   Advaith has been advised to work up to 300-450 minutes of moderate intensity aerobic activity a week and strengthening exercises 2-3 times per week for cardiovascular health, weight loss maintenance and preservation of muscle  mass.  He has agreed to : Start strengthening exercises with a goal of 2-3 sessions a week    Behavioral Modifications Evidence-based interventions for health behavior change were utilized today including the discussion of   1) SMART goals for next OV:    - Begin going to the gym 3 times a week  - Talk to PCP about nephrologist  - Drink 80 oz of water  daily   Regarding patient's less desirable eating habits and patterns, we employed the technique of small changes.   We discussed the following today: increasing lean protein intake to established goals and continue to work on maintaining a reduced calorie state, getting the recommended amount of protein, incorporating whole foods, making healthy choices, staying well hydrated and practicing mindfulness when eating. Additional resources provided today: Handout on traveling and holiday eating strategies   Medical Interventions/ Pharmacotherapy Previous Bariatric surgery: n/a Pharmacotherapy for weight loss: He is currently taking Metformin XR 1000 mg twice daily, Mounjaro  15 mg weekly  for medical weight loss.    On Mounjaro  15 mg weekly. With reported good compliance and tolerance. Patient states that he might have to discontinue the use of Mounjaro  in the near future due to an insurance change. He states that the cost will increase and he is insure if  he will be able to continue to afford the medication afterwards. He states that his hunger and cravings are well controlled. Continue with medication and following prudent meal plan.    We discussed various medication options to help [name] with his weight loss efforts and we both agreed to : Continue with current nutritional and behavioral strategies   B) OBESITY RELATED CONDITIONS ADDRESSED TODAY:  Type 2 diabetes mellitus with other specified complication, without long-term current use of insulin  [facility]) Assessment & Plan Lab Results  Component Value Date   HGBA1C 5.9 07/09/2023    HGBA1C 8.5 05/03/2023   HGBA1C 6.3 (H) 10/05/2021   INSULIN  12.4 05/21/2023   INSULIN  9.5 10/31/2020   INSULIN  28.9 (H) 09/01/2019    Lab Results  Component Value Date   NA 138 07/09/2023   CL 103 07/09/2023   K 4.6 07/09/2023   CO2 27 (A) 07/09/2023   BUN 29 (A) 07/09/2023   CREATININE 1.5 (A) 07/09/2023   EGFR 54 07/09/2023   CALCIUM  9.5 07/09/2023   ALBUMIN 4.3 07/09/2023   GLUCOSE 182 (H) 10/11/2022   On Jardiance 25 mg daily, Metformin XR 1,000 mg twice daily, Mounjaro  15 mg weekly, and Glimepiride  once daily. Patient states that he previously discontinued the use of Glimepiride  but has restarted the medication due to his blood sugar levels being on the high end. His fasting blood sugar levels were in the 130s before hand. He states only being back on the medication for about a week now. Recommended blood work today but patient declines and would like to get it done at next OV.   Patient states that he has not talked to a nephrologist or his PCP about his kidney function. Explained to patient the importance of having sugar levels and BP under well control to help lessen the stress on the kidneys. GFR levels should be greater than 60 and is currently 54. Stressed the importance of reaching out to PCP/specialist. Continue following prudent meal plan and increasing exercise as tolerated and increasing water  intake to help lessen the stress on kidneys.     Hypertension associated with type 2 diabetes mellitus [facility]) Assessment & Plan BP Readings from Last 3 Encounters:  01/14/24 115/73  12/12/23 105/66  10/28/23 116/74   The ASCVD Risk score (Arnett DK, et al., 2019) failed to calculate for the following reasons:   Risk score cannot be calculated because patient has a medical history suggesting prior/existing ASCVD  Lab Results  Component Value Date   CREATININE 1.5 (A) 07/09/2023   On Norvasc  10 mg daily, Coreg  25 mg twice daily and Cozaar  50 mg twice daily. BP is at goal  today- well controlled. Stressed the importance of having well controlled BP. Continue with medications and decreasing foods high in sodium like pre packed frozen foods.     Vitamin D  deficiency Assessment & Plan Lab Results  Component Value Date   VD25OH 42.7 07/09/2023   VD25OH 39.3 05/21/2023   VD25OH 73.1 10/05/2021   On ERGO 50K units once every 2 weeks. Patient reports good compliance and tolerance. Will refill today. Reminded patient of the importance of at goal Vit D levels. Continue with supplementation. Will reobtain labs as medically necessary.       Medications Discontinued During This Encounter  Medication Reason   carvedilol  (COREG ) 6.25 MG tablet Dose change   Vitamin D , Ergocalciferol , (DRISDOL ) 1.25 MG (50000 UNIT) CAPS capsule Reorder     Meds ordered this encounter  Medications   Vitamin  D, Ergocalciferol , (DRISDOL ) 1.25 MG (50000 UNIT) CAPS capsule    Sig: 1 po q 2 wks    Dispense:  6 capsule    Refill:  0     Follow up:   Return 03/05/2024 at 9:20 AM  He was informed of the importance of frequent follow up visits to maximize his success with intensive lifestyle modifications for his multiple health conditions.   Weight Summary and Biometrics   Weight Lost Since Last Visit: 0  Weight Gained Since Last Visit: 3 lb  Vitals Temp: 98.4 F (36.9 C) BP: 115/73 Pulse Rate: 76 SpO2: 97 %   Anthropometric Measurements Height: 5' 10 (1.778 m) Weight: 246 lb (111.6 kg) BMI (Calculated): 35.3 Weight at Last Visit: 243 lb Weight Lost Since Last Visit: 0 Weight Gained Since Last Visit: 3 lb Starting Weight: 273 lb Total Weight Loss (lbs): 27 lb (12.2 kg) Peak Weight: 330 lb   Body Composition  Body Fat %: 38 % Fat Mass (lbs): 93.6 lbs Muscle Mass (lbs): 145 lbs Total Body Water  (lbs): 112.8 lbs Visceral Fat Rating : 22   Other Clinical Data Fasting: No Labs: No Today's Visit #: 8 Starting Date: 05/21/23    Objective:   PHYSICAL  EXAM: Blood pressure 115/73, pulse 76, temperature 98.4 F (36.9 C), height 5' 10 (1.778 m), weight 246 lb (111.6 kg), SpO2 97%. Body mass index is 35.3 kg/m.  General: he is overweight, cooperative and in no acute distress. PSYCH: Has normal mood, affect and thought process.   HEENT: EOMI, sclerae are anicteric. Lungs: Normal breathing effort, no conversational dyspnea. Extremities: Moves * 4 Neurologic: A and O * 3, good insight  DIAGNOSTIC DATA REVIEWED: BMET    Component Value Date/Time   NA 138 07/09/2023 0833   K 4.6 07/09/2023 0833   CL 103 07/09/2023 0833   CO2 27 (A) 07/09/2023 0833   GLUCOSE 182 (H) 10/11/2022 0950   GLUCOSE 156 (H) 11/17/2019 1440   BUN 29 (A) 07/09/2023 0833   CREATININE 1.5 (A) 07/09/2023 0833   CREATININE 1.67 (H) 10/11/2022 0950   CREATININE 1.30 05/18/2019 1122   CALCIUM  9.5 07/09/2023 0833   GFRNONAA 67 03/14/2020 1446   GFRNONAA 61 05/18/2019 1122   GFRAA 77 03/14/2020 1446   GFRAA 71 05/18/2019 1122   Lab Results  Component Value Date   HGBA1C 5.9 07/09/2023   HGBA1C (H) 04/26/2010    11.9 (NOTE)                                                                       According to the ADA Clinical Practice Recommendations for 2011, when HbA1c is used as a screening test:   >=6.5%   Diagnostic of Diabetes Mellitus           (if abnormal result  is confirmed)  5.7-6.4%   Increased risk of developing Diabetes Mellitus  References:Diagnosis and Classification of Diabetes Mellitus,Diabetes Care,2011,34(Suppl 1):S62-S69 and Standards of Medical Care in         Diabetes - 2011,Diabetes Care,2011,34  (Suppl 1):S11-S61.   Lab Results  Component Value Date   INSULIN  12.4 05/21/2023   INSULIN  28.9 (H) 09/01/2019   Lab Results  Component Value Date   TSH 2.910  05/21/2023   CBC    Component Value Date/Time   WBC 10.4 05/21/2023 1122   WBC 7.3 11/17/2019 1440   RBC 4.92 05/21/2023 1122   RBC 4.73 11/17/2019 1440   HGB 14.9 05/21/2023 1122    HCT 44.2 05/21/2023 1122   PLT 355 05/21/2023 1122   MCV 90 05/21/2023 1122   MCH 30.3 05/21/2023 1122   MCH 30.0 11/17/2019 1440   MCHC 33.7 05/21/2023 1122   MCHC 33.6 11/17/2019 1440   RDW 12.4 05/21/2023 1122   Iron Studies No results found for: IRON, TIBC, FERRITIN, IRONPCTSAT Lipid Panel     Component Value Date/Time   CHOL 143 05/21/2023 1122   TRIG 97 05/21/2023 1122   HDL 47 05/21/2023 1122   CHOLHDL 3.0 05/21/2023 1122   CHOLHDL 4.1 04/27/2010 0035   VLDL 29 04/27/2010 0035   LDLCALC 78 05/21/2023 1122   Hepatic Function Panel     Component Value Date/Time   PROT 7.2 05/21/2023 1122   ALBUMIN 4.3 07/09/2023 0833   ALBUMIN 4.6 05/21/2023 1122   AST 14 07/09/2023 0833   ALT 13 07/09/2023 0833   ALKPHOS 104 05/21/2023 1122   BILITOT 0.9 05/21/2023 1122   BILIDIR 0.2 07/09/2023 0833   BILIDIR 0.30 05/21/2023 1122      Component Value Date/Time   TSH 2.910 05/21/2023 1122   Nutritional Lab Results  Component Value Date   VD25OH 42.7 07/09/2023   VD25OH 39.3 05/21/2023   VD25OH 73.1 10/05/2021    Attestations:   I, Sonny Laroche, acting as a stage manager for James Jenkins, DO., have compiled all relevant documentation for today's office visit on behalf of James Jenkins, DO, while in the presence of [employer], DO.   I have reviewed the above documentation for accuracy and completeness, and I agree with the above. James JINNY Ross, D.O.  The 21st Century Cures Act was signed into law in 2016 which includes the topic of electronic health records.  This provides immediate access to information in MyChart.  This includes consultation notes, operative notes, office notes, lab results and pathology reports.  If you have any questions about what you read please let us  know at your next visit so we can discuss your concerns and take corrective action if need be.  We are right here with you.

## 2024-01-16 DIAGNOSIS — B349 Viral infection, unspecified: Secondary | ICD-10-CM | POA: Diagnosis not present

## 2024-01-16 DIAGNOSIS — Z20822 Contact with and (suspected) exposure to covid-19: Secondary | ICD-10-CM | POA: Diagnosis not present

## 2024-01-16 DIAGNOSIS — Z20828 Contact with and (suspected) exposure to other viral communicable diseases: Secondary | ICD-10-CM | POA: Diagnosis not present

## 2024-01-16 DIAGNOSIS — U071 COVID-19: Secondary | ICD-10-CM | POA: Diagnosis not present

## 2024-01-23 ENCOUNTER — Ambulatory Visit (INDEPENDENT_AMBULATORY_CARE_PROVIDER_SITE_OTHER): Payer: Self-pay | Admitting: Family Medicine

## 2024-03-05 ENCOUNTER — Encounter (INDEPENDENT_AMBULATORY_CARE_PROVIDER_SITE_OTHER): Payer: Self-pay | Admitting: Family Medicine

## 2024-03-05 ENCOUNTER — Ambulatory Visit (INDEPENDENT_AMBULATORY_CARE_PROVIDER_SITE_OTHER): Admitting: Family Medicine

## 2024-03-05 VITALS — BP 116/67 | HR 70 | Temp 98.1°F | Ht 70.0 in | Wt 246.0 lb

## 2024-03-05 DIAGNOSIS — Z7984 Long term (current) use of oral hypoglycemic drugs: Secondary | ICD-10-CM

## 2024-03-05 DIAGNOSIS — Z7985 Long-term (current) use of injectable non-insulin antidiabetic drugs: Secondary | ICD-10-CM

## 2024-03-05 DIAGNOSIS — I10 Essential (primary) hypertension: Secondary | ICD-10-CM

## 2024-03-05 DIAGNOSIS — F3289 Other specified depressive episodes: Secondary | ICD-10-CM | POA: Diagnosis not present

## 2024-03-05 DIAGNOSIS — R4589 Other symptoms and signs involving emotional state: Secondary | ICD-10-CM

## 2024-03-05 DIAGNOSIS — E1159 Type 2 diabetes mellitus with other circulatory complications: Secondary | ICD-10-CM | POA: Diagnosis not present

## 2024-03-05 DIAGNOSIS — F5089 Other specified eating disorder: Secondary | ICD-10-CM

## 2024-03-05 DIAGNOSIS — I25118 Atherosclerotic heart disease of native coronary artery with other forms of angina pectoris: Secondary | ICD-10-CM

## 2024-03-05 DIAGNOSIS — E1169 Type 2 diabetes mellitus with other specified complication: Secondary | ICD-10-CM | POA: Diagnosis not present

## 2024-03-05 NOTE — Progress Notes (Signed)
 "  [name], D.O.  ABFM, ABOM Specializing in Clinical Bariatric Medicine  Office located at: [address] [place], [place]  [address]    Orders Placed This Encounter  Procedures   VITAMIN D  25 Hydroxy (Vit-D Deficiency, Fractures)   Lipid panel   Comprehensive metabolic panel with GFR   CBC with Differential/Platelet   Magnesium   TSH   T4, free   Hemoglobin A1c   There are no discontinued medications.   No orders of the defined types were placed in this encounter.    FOR THE CHRONIC DISEASE OF OBESITY:  Chief complaint: Obesity [name] is here to discuss his progress with his obesity treatment plan.   History of present illness / Interval history:  [name] is here today for his follow-up office visit.  Since last OV on 01/14/2024 with provider: Dr. Jenkins, patient has been well overall. He notes that his hunger/cravings were controlled for the most part, with the most difficult time being around the holidays due to general availability.   Recent challenges/ barriers to successful adherence of program recommendations: good adherence to reduced calorie nutritional plan.  Pt has been struggling with:  []  meal planning and prepping []  exercise []  sleep []  stressors []  mood []  chronic or acute medical conditions-  []  eating out more [x]  Nothing, doing great  Pt has been working on and improving their:  []  meal planning and prepping []  exercise []  sleep hygiene []  water  intake []  strategies to better manage personal stressors []  strategies to better manage mood []  eating out less [x]  Nothing particular at this time  When asked by CMA prior to our office visit today, pt states they have been:  - Focused on eating fresh, unprocessed foods?  Yes   - Focused on eating lean proteins with each meal?  Yes   - Sleeping 7-9 Hours/ Night?  No - Skipping Meals?  No   - States he is following his healthy eating plan approximately 50% of the  time.   Recent weight loss data history  01/14/24 10:00 03/05/24 09:00  Fat Mass (lbs) 93.6 lbs 95.2 lbs  Total Body Water  (lbs) 112.8 lbs 109.6 lbs  Muscle Mass (lbs) 145 lbs 144 lbs   Body Fat % 38 % 38.6 %    Physician directed Nutrition Therapy prescription: He is on the Category 2 Plan with B/L options  [name] is currently in the action stage of change. As such, his goal is to increased activity as tolerable.  He has agreed to: continue current reduced-calorie meal plan    Physician directed Behavioral Modification prescription: Evidence-based interventions for healthy behavior change were utilized today including the discussion of small changes and SMART goals. barriers to successful adherence to behavorial change for wt loss: n/a  We discussed the following today: continue to work on maintaining a reduced calorie state, getting the recommended amount of protein, incorporating whole foods, making healthy choices, staying well hydrated and practicing mindfulness when eating.  Physician directed Physical Activity prescription: He is not exercising. barriers to successful adherence to exercise for wt loss: n/a.  Majestic has been educated on the need to increase exercise to 300-450 minutes per week to treat obesity and sustain weight loss  Exercise prescription: He has agreed to Start aerobic activity with a goal of 150 minutes a week at moderate intensity.  and Increase volume of physical activity to a goal of 240 minutes a week    Medical Interventions/ Pharmacotherapy  Previous Bariatric surgery: n/a  Pharmacotherapy for weight loss: He is currently taking Metformin XR 1000 mg twice daily, Mounjaro  15 mg weekly for medical weight loss.    We discussed various medication options to help Eland with his weight loss efforts and we both agreed to:  Adequate clinical response to anti-obesity medication, continue current anti-obesity regimen  SPECIFIC behavorial /  nutritional / exercise goals for next office visit:  plans to start moving more/ increase NEAT   B) OBESITY RELATED CONDITIONS ADDRESSED TODAY:  Type 2 diabetes mellitus with other specified complication, without long-term current use of insulin  (HCC)  He notes blood glucoses of 106 nadir - 171 peak and is compliant with his medications.  He says that his hunger and cravings were in good control except over the holidays, but these are improved.   Assessment & Plan: Not at goal. Goal: Managed with Jardiance 25 mg daily, Mounjaro  15 mg weekly, Metformin 1000 mg BID, and Amaryl  1 mg (obesogenic, however blood sugars are too well controlled to justify discontinuation as these have significantly increased every time we've tried weaning him off).   - Continue medications above as prescribed for blood sugar control. - Issues reviewed: blood sugar goals, complications of diabetes mellitus, hypoglycemia prevention and treatment, exercise, and nutrition.  - The patient was encouraged to monitor blood sugars regularly and to bring their log to the next appointment for review.   - We will recheck labs in 3 months if not done with Endocrinology or PCP.   - The patient will continue to focus on protein-rich, low simple carbohydrate foods.  - We reviewed the importance of hydration, regular exercise for stress reduction, and restorative sleep.  - Orders as below, to be reviewed at next OV. -     VITAMIN D  25 Hydroxy (Vit-D Deficiency, Fractures) -     Lipid panel -     Comprehensive metabolic panel with GFR -     CBC with Differential/Platelet -     Magnesium -     TSH -     T4, free -     Hemoglobin A1c Lab Results  Component Value Date   HGBA1C 6.4 (H) 03/05/2024   HGBA1C 5.9 07/09/2023   HGBA1C 8.5 05/03/2023   Lab Results  Component Value Date   LDLCALC 72 03/05/2024   CREATININE 1.23 03/05/2024     Hypertension associated with type 2 diabetes mellitus (HCC) Assessment & Plan: At  goal. Medications: Losartan  50 mg daily, Carvedilol  25 mg BID, and Amlodipine  10 mg daily.   - Avoid buying foods that are: processed, frozen, or prepackaged to avoid excess salt.  - Ambulatory blood pressure monitoring was encouraged with a goal of at least 2-3 times weekly or when feeling poorly.   - We will continue to monitor closely alongside his PCP and/or Specialist. Regular follow up with PCP and specialists was also encouraged.  - I recommended the patient to drink approximately half of their body weight in ounces of water  daily. Additionally, have an extra bottle of water  for every 30 minutes of exercise. - Orders as per DMII A/P, to be reviewed at next OV. BP Readings from Last 3 Encounters:  03/05/24 116/67  01/14/24 115/73  12/12/23 105/66   Lab Results  Component Value Date   CREATININE 1.23 03/05/2024     Coronary artery disease of native artery of native heart with stable angina pectoris (HCC) - h/o acute myocardial infarction At goal. Lipid-lowering medications: Atorvastatin  20 mg daily.   -  Continue lifestyle changes: increase soluble fiber, decrease simple carbohydrates, decrease saturated fat, increase moderate to vigorous-intensity aerobic activity 150 minutes per week or as tolerated.  - We will continue to monitor along with PCP/specialists as it pertains to his weight loss journey. Lab Results  Component Value Date   CHOL 143 03/05/2024   HDL 59 03/05/2024   LDLCALC 72 03/05/2024   TRIG 55 03/05/2024   CHOLHDL 2.4 03/05/2024   Lab Results  Component Value Date   ALT 24 03/05/2024   AST 18 03/05/2024   ALKPHOS 88 03/05/2024   BILITOT 1.3 (H) 03/05/2024   The ASCVD Risk score (Arnett DK, et al., 2019) failed to calculate for the following reasons:   Risk score cannot be calculated because patient has a medical history suggesting prior/existing ASCVD   * - Cholesterol units were assumed    Depressed mood - emotional eating  He says that overall, his  mood has been good, except for the current political climate causing some stress/anxiety. Denies SI/HI or thoughts of self-harm, but does admit thoughts are becoming somewhat darker - no emotional eating.   Assessment & Plan: His mood is stable and his emotional eating is controlled on Prozac 20 mg daily. He notes that he has previously tried Wellbutrin , but this was ineffective.   - Continue Prozac 20 mg daily.  - Discussed with patient about considering Wellbutrin  in the future if needed for emotional eating.   - Avoid stressors whenever possible. - Orders as below, to be reviewed at next OV. -     Magnesium -     TSH -     T4, free  Follow up:   Follow-up on 04/16/2024 at 9 AM.  He was informed of the importance of frequent follow up visits to maximize his success with intensive lifestyle modifications for his multiple health conditions.  Labs obtained today and will be discussed/ addressed at their next office visit unless critical medical findings arise, then we will contact via Mychart or phone call.  Weight Summary and Biometrics    10/28/23 09:00 12/12/23 09:00 01/14/24 10:00 03/05/24 09:00  Height 5' 10 (1.778 m) 5' 10 (1.778 m) 5' 10 (1.778 m) 5' 10 (1.778 m)  Weight 249 lb (112.9 kg) 243 lb (110.2 kg) 246 lb (111.6 kg) 246 lb (111.6 kg)  BMI (Calculated) 35.73 34.87 35.3 35.3  Total Weight Loss (lbs) 24 lb (10.9 kg) 30 lb (13.6 kg) 27 lb (12.2 kg) 27 lb (12.2 kg)  Systolic BP 116 105 115 116  Diastolic BP 74 66 73 67  Pulse Rate 69 74 76 70  Fasting yes yes No Yes  Labs no no No Yes  Today's Visit # 6 7 8 9   Weight at Last Visit 251lb 249lb 243 lb 246 lb  Weight Lost Since Last Visit 2lb 6lb 0 0   Body Fat % 38.4 % 37.4 % 38 % 38.6 %  Muscle Mass (lbs) 146 lbs 144.8 lbs 145 lbs 144 lbs  Fat Mass (lbs) 95.6 lbs 91.2 lbs 93.6 lbs 95.2 lbs  Total Body Water  (lbs) 115.8 lbs 114.2 lbs 112.8 lbs 109.6 lbs  Visceral Fat Rating  22 21 22 22   Peak Weight 330lb 330lb 330 lb  330 lb  Starting Date 05/21/2023 05/21/2023 05/21/2023 05/21/2023  Starting Weight 273lb 273lb 273 lb 273 lb    Objective:   PHYSICAL EXAM: Blood pressure 116/67, pulse 70, temperature 98.1 F (36.7 C), height 5' 10 (1.778 m), weight 246 lb (  111.6 kg), SpO2 98%. Body mass index is 35.3 kg/m. General: he is overweight, cooperative and in no acute distress. PSYCH: Has normal mood, affect and thought process.   HEENT: EOMI, sclerae are anicteric. Lungs: Normal breathing effort, no conversational dyspnea. Extremities: Moves * 4 Neurologic: A and O * 3, good insight  DIAGNOSTIC DATA REVIEWED: BMET    Component Value Date/Time   NA 140 03/05/2024 1013   K 4.7 03/05/2024 1013   CL 104 03/05/2024 1013   CO2 20 03/05/2024 1013   GLUCOSE 124 (H) 03/05/2024 1013   GLUCOSE 156 (H) 11/17/2019 1440   BUN 25 03/05/2024 1013   CREATININE 1.23 03/05/2024 1013   CREATININE 1.30 05/18/2019 1122   CALCIUM  9.6 03/05/2024 1013   GFRNONAA 67 03/14/2020 1446   GFRNONAA 61 05/18/2019 1122   GFRAA 77 03/14/2020 1446   GFRAA 71 05/18/2019 1122   Lab Results  Component Value Date   HGBA1C 6.4 (H) 03/05/2024   HGBA1C (H) 04/26/2010    11.9 (NOTE)                                                                       According to the ADA Clinical Practice Recommendations for 2011, when HbA1c is used as a screening test:   >=6.5%   Diagnostic of Diabetes Mellitus           (if abnormal result  is confirmed)  5.7-6.4%   Increased risk of developing Diabetes Mellitus  References:Diagnosis and Classification of Diabetes Mellitus,Diabetes Care,2011,34(Suppl 1):S62-S69 and Standards of Medical Care in         Diabetes - 2011,Diabetes Care,2011,34  (Suppl 1):S11-S61.   Lab Results  Component Value Date   INSULIN  12.4 05/21/2023   INSULIN  28.9 (H) 09/01/2019   Lab Results  Component Value Date   TSH 2.210 03/05/2024   CBC    Component Value Date/Time   WBC 8.4 03/05/2024 1013   WBC 7.3 11/17/2019 1440    RBC 5.04 03/05/2024 1013   RBC 4.73 11/17/2019 1440   HGB 15.2 03/05/2024 1013   HCT 46.1 03/05/2024 1013   PLT 312 03/05/2024 1013   MCV 92 03/05/2024 1013   MCH 30.2 03/05/2024 1013   MCH 30.0 11/17/2019 1440   MCHC 33.0 03/05/2024 1013   MCHC 33.6 11/17/2019 1440   RDW 13.3 03/05/2024 1013   Iron Studies No results found for: IRON, TIBC, FERRITIN, IRONPCTSAT Lipid Panel     Component Value Date/Time   CHOL 143 03/05/2024 1013   TRIG 55 03/05/2024 1013   HDL 59 03/05/2024 1013   CHOLHDL 2.4 03/05/2024 1013   CHOLHDL 4.1 04/27/2010 0035   VLDL 29 04/27/2010 0035   LDLCALC 72 03/05/2024 1013   Hepatic Function Panel     Component Value Date/Time   PROT 7.0 03/05/2024 1013   ALBUMIN 4.4 03/05/2024 1013   AST 18 03/05/2024 1013   ALT 24 03/05/2024 1013   ALKPHOS 88 03/05/2024 1013   BILITOT 1.3 (H) 03/05/2024 1013   BILIDIR 0.2 07/09/2023 0833   BILIDIR 0.30 05/21/2023 1122      Component Value Date/Time   TSH 2.210 03/05/2024 1013   Nutritional Lab Results  Component Value Date   VD25OH 47.7 03/05/2024  VD25OH 42.7 07/09/2023   VD25OH 39.3 05/21/2023    Attestations:   I, Damien Blanks, acting as a medical scribe for James Jenkins, DO., have compiled all relevant documentation for today's office visit on behalf of James Jenkins, DO, while in the presence of [employer], DO.  I have spent 40 minutes in the care of the patient including:   -   5 minutes before the visit reviewing and preparing the chart.   -   30 minutes face-to-face assessing and reviewing listed medical problems as outlined in obesity care plan, providing nutritional and behavioral counseling on topics outlined in the obesity care plan, independently interpreting test results and goals of care as described in assessment and plan, and reviewing and discussing biometric information and progress with obesity treatment plan  -   5 minutes after the visit regarding the EMR  documentation of encounter.   I have reviewed the above documentation for accuracy and completeness, and I agree with the above. James JINNY Ross, D.O.  The 21st Century Cures Act was signed into law in 2016 which includes the topic of electronic health records.  This provides immediate access to information in MyChart.  This includes consultation notes, operative notes, office notes, lab results and pathology reports.  If you have any questions about what you read please let us  know at your next visit so we can discuss your concerns and take corrective action if need be.  We are right here with you. "

## 2024-03-06 LAB — COMPREHENSIVE METABOLIC PANEL WITH GFR
ALT: 24 IU/L (ref 0–44)
AST: 18 IU/L (ref 0–40)
Albumin: 4.4 g/dL (ref 3.9–4.9)
Alkaline Phosphatase: 88 IU/L (ref 47–123)
BUN/Creatinine Ratio: 20 (ref 10–24)
BUN: 25 mg/dL (ref 8–27)
Bilirubin Total: 1.3 mg/dL — ABNORMAL HIGH (ref 0.0–1.2)
CO2: 20 mmol/L (ref 20–29)
Calcium: 9.6 mg/dL (ref 8.6–10.2)
Chloride: 104 mmol/L (ref 96–106)
Creatinine, Ser: 1.23 mg/dL (ref 0.76–1.27)
Globulin, Total: 2.6 g/dL (ref 1.5–4.5)
Glucose: 124 mg/dL — ABNORMAL HIGH (ref 70–99)
Potassium: 4.7 mmol/L (ref 3.5–5.2)
Sodium: 140 mmol/L (ref 134–144)
Total Protein: 7 g/dL (ref 6.0–8.5)
eGFR: 67 mL/min/1.73

## 2024-03-06 LAB — LIPID PANEL
Chol/HDL Ratio: 2.4 ratio (ref 0.0–5.0)
Cholesterol, Total: 143 mg/dL (ref 100–199)
HDL: 59 mg/dL
LDL Chol Calc (NIH): 72 mg/dL (ref 0–99)
Triglycerides: 55 mg/dL (ref 0–149)
VLDL Cholesterol Cal: 12 mg/dL (ref 5–40)

## 2024-03-06 LAB — HEMOGLOBIN A1C
Est. average glucose Bld gHb Est-mCnc: 137 mg/dL
Hgb A1c MFr Bld: 6.4 % — ABNORMAL HIGH (ref 4.8–5.6)

## 2024-03-06 LAB — CBC WITH DIFFERENTIAL/PLATELET
Basophils Absolute: 0.1 x10E3/uL (ref 0.0–0.2)
Basos: 1 %
EOS (ABSOLUTE): 0.1 x10E3/uL (ref 0.0–0.4)
Eos: 2 %
Hematocrit: 46.1 % (ref 37.5–51.0)
Hemoglobin: 15.2 g/dL (ref 13.0–17.7)
Immature Grans (Abs): 0 x10E3/uL (ref 0.0–0.1)
Immature Granulocytes: 0 %
Lymphocytes Absolute: 1.6 x10E3/uL (ref 0.7–3.1)
Lymphs: 19 %
MCH: 30.2 pg (ref 26.6–33.0)
MCHC: 33 g/dL (ref 31.5–35.7)
MCV: 92 fL (ref 79–97)
Monocytes Absolute: 0.7 x10E3/uL (ref 0.1–0.9)
Monocytes: 9 %
Neutrophils Absolute: 5.8 x10E3/uL (ref 1.4–7.0)
Neutrophils: 69 %
Platelets: 312 x10E3/uL (ref 150–450)
RBC: 5.04 x10E6/uL (ref 4.14–5.80)
RDW: 13.3 % (ref 11.6–15.4)
WBC: 8.4 x10E3/uL (ref 3.4–10.8)

## 2024-03-06 LAB — TSH: TSH: 2.21 u[IU]/mL (ref 0.450–4.500)

## 2024-03-06 LAB — T4, FREE: Free T4: 1.57 ng/dL (ref 0.82–1.77)

## 2024-03-06 LAB — MAGNESIUM: Magnesium: 2.1 mg/dL (ref 1.6–2.3)

## 2024-03-06 LAB — VITAMIN D 25 HYDROXY (VIT D DEFICIENCY, FRACTURES): Vit D, 25-Hydroxy: 47.7 ng/mL (ref 30.0–100.0)

## 2024-04-16 ENCOUNTER — Ambulatory Visit (INDEPENDENT_AMBULATORY_CARE_PROVIDER_SITE_OTHER): Admitting: Family Medicine

## 2024-05-28 ENCOUNTER — Ambulatory Visit (INDEPENDENT_AMBULATORY_CARE_PROVIDER_SITE_OTHER): Admitting: Family Medicine
